# Patient Record
Sex: Female | Born: 1985 | Race: White | Hispanic: No | Marital: Single
Health system: Midwestern US, Community
[De-identification: ages and names within clinical notes are randomized; demographics above are authoritative.]

## PROBLEM LIST (undated history)

## (undated) DIAGNOSIS — K5090 Crohn's disease, unspecified, without complications: Secondary | ICD-10-CM

## (undated) DIAGNOSIS — E282 Polycystic ovarian syndrome: Secondary | ICD-10-CM

## (undated) DIAGNOSIS — O039 Complete or unspecified spontaneous abortion without complication: Secondary | ICD-10-CM

## (undated) DIAGNOSIS — F32A Depression, unspecified: Secondary | ICD-10-CM

## (undated) DIAGNOSIS — R000 Tachycardia, unspecified: Secondary | ICD-10-CM

## (undated) DIAGNOSIS — Z8639 Personal history of other endocrine, nutritional and metabolic disease: Secondary | ICD-10-CM

## (undated) DIAGNOSIS — F419 Anxiety disorder, unspecified: Secondary | ICD-10-CM

## (undated) DIAGNOSIS — F1010 Alcohol abuse, uncomplicated: Secondary | ICD-10-CM

## (undated) DIAGNOSIS — N856 Intrauterine synechiae: Secondary | ICD-10-CM

## (undated) DIAGNOSIS — A498 Other bacterial infections of unspecified site: Secondary | ICD-10-CM

## (undated) DIAGNOSIS — E063 Autoimmune thyroiditis: Secondary | ICD-10-CM

## (undated) HISTORY — PX: INDUCED ABORTION: SHX677

## (undated) HISTORY — PX: BREAST IMPLANT: SHX2716

## (undated) HISTORY — PX: COLONOSCOPY: SHX174

## (undated) HISTORY — PX: TONSILLECTOMY: SUR1361

## (undated) HISTORY — DX: Anxiety disorder, unspecified: F41.9

## (undated) HISTORY — PX: DILATION AND CURETTAGE OF UTERUS: SHX78

## (undated) HISTORY — DX: Depression, unspecified: F32.A

## (undated) HISTORY — PX: BREAST ENHANCEMENT SURGERY: SHX7

---

## 2008-01-05 DIAGNOSIS — E063 Autoimmune thyroiditis: Secondary | ICD-10-CM | POA: Insufficient documentation

## 2010-07-24 ENCOUNTER — Emergency Department: Admit: 2010-07-24 | Payer: Self-pay | Source: Emergency Department | Admitting: Emergency Medicine

## 2012-12-03 DIAGNOSIS — M25559 Pain in unspecified hip: Secondary | ICD-10-CM | POA: Insufficient documentation

## 2013-02-14 ENCOUNTER — Emergency Department
Admission: EM | Admit: 2013-02-14 | Discharge: 2013-02-14 | Disposition: A | Discharge status: Home / Self Care | Payer: BLUE CROSS/BLUE SHIELD | Attending: Emergency Medicine | Admitting: Emergency Medicine

## 2013-02-14 ENCOUNTER — Emergency Department: Payer: BLUE CROSS/BLUE SHIELD

## 2013-02-14 DIAGNOSIS — E860 Dehydration: Secondary | ICD-10-CM

## 2013-02-14 DIAGNOSIS — F411 Generalized anxiety disorder: Secondary | ICD-10-CM | POA: Insufficient documentation

## 2013-02-14 DIAGNOSIS — E063 Autoimmune thyroiditis: Secondary | ICD-10-CM | POA: Insufficient documentation

## 2013-02-14 DIAGNOSIS — K5090 Crohn's disease, unspecified, without complications: Secondary | ICD-10-CM | POA: Insufficient documentation

## 2013-02-14 DIAGNOSIS — R42 Dizziness and giddiness: Secondary | ICD-10-CM | POA: Insufficient documentation

## 2013-02-14 DIAGNOSIS — T50905A Adverse effect of unspecified drugs, medicaments and biological substances, initial encounter: Secondary | ICD-10-CM

## 2013-02-14 DIAGNOSIS — T3795XA Adverse effect of unspecified systemic anti-infective and antiparasitic, initial encounter: Secondary | ICD-10-CM | POA: Insufficient documentation

## 2013-02-14 HISTORY — DX: Anxiety disorder, unspecified: F41.9

## 2013-02-14 HISTORY — DX: Crohn's disease, unspecified, without complications: K50.90

## 2013-02-14 HISTORY — DX: Personal history of other endocrine, nutritional and metabolic disease: Z86.39

## 2013-02-14 LAB — CBC AND DIFFERENTIAL
Basophils Absolute Automated: 0.03 10*3/uL (ref 0.00–0.20)
Basophils Automated: 0 %
Eosinophils Absolute Automated: 0.2 10*3/uL (ref 0.00–0.70)
Eosinophils Automated: 2 %
Hematocrit: 37.4 % (ref 37.0–47.0)
Hgb: 12.5 g/dL (ref 12.0–16.0)
Lymphocytes Absolute Automated: 3.52 10*3/uL (ref 0.50–4.40)
Lymphocytes Automated: 37 %
MCH: 30.6 pg (ref 28.0–32.0)
MCHC: 33.4 g/dL (ref 32.0–36.0)
MCV: 91.7 fL (ref 80.0–100.0)
MPV: 9.7 fL (ref 9.4–12.3)
Monocytes Absolute Automated: 1.07 10*3/uL (ref 0.00–1.20)
Monocytes: 11 %
Neutrophils Absolute: 4.63 10*3/uL (ref 1.80–8.10)
Neutrophils: 49 %
Platelets: 383 10*3/uL (ref 140–400)
RBC: 4.08 10*6/uL — ABNORMAL LOW (ref 4.20–5.40)
RDW: 13 % (ref 12–15)
WBC: 9.45 10*3/uL (ref 3.50–10.80)

## 2013-02-14 LAB — BASIC METABOLIC PANEL
Anion Gap: 14 (ref 5.0–15.0)
BUN: 9 mg/dL (ref 7.0–19.0)
CO2: 22 mEq/L (ref 22–29)
Calcium: 9.7 mg/dL (ref 8.5–10.5)
Chloride: 101 mEq/L (ref 98–107)
Creatinine: 0.9 mg/dL (ref 0.6–1.0)
Glucose: 92 mg/dL (ref 70–100)
Potassium: 3.7 mEq/L (ref 3.5–5.1)
Sodium: 137 mEq/L (ref 136–145)

## 2013-02-14 LAB — HEPATIC FUNCTION PANEL
ALT: 16 U/L (ref 0–55)
AST (SGOT): 22 U/L (ref 5–34)
Albumin/Globulin Ratio: 1.2 (ref 0.9–2.2)
Albumin: 4.3 g/dL (ref 3.5–5.0)
Alkaline Phosphatase: 68 U/L (ref 40–150)
Bilirubin Direct: 0.4 mg/dL (ref 0.0–0.5)
Bilirubin Indirect: 0.3 mg/dL (ref 0.0–1.1)
Bilirubin, Total: 0.7 mg/dL (ref 0.2–1.2)
Globulin: 3.5 g/dL (ref 2.0–3.6)
Protein, Total: 7.8 g/dL (ref 6.0–8.3)

## 2013-02-14 LAB — LIPASE: Lipase: 10 U/L (ref 8–78)

## 2013-02-14 LAB — GFR: EGFR: >60

## 2013-02-14 MED ORDER — ONDANSETRON HCL 4 MG/2ML IJ SOLN
8.00 mg | Freq: Once | INTRAMUSCULAR | Status: AC
Start: 2013-02-14 — End: 2013-02-14
  Administered 2013-02-14: 8 mg via INTRAVENOUS
  Filled 2013-02-14: qty 4

## 2013-02-14 MED ORDER — LORAZEPAM 2 MG/ML IJ SOLN
1.0000 mg | Freq: Once | INTRAMUSCULAR | Status: AC
Start: 2013-02-14 — End: 2013-02-14
  Administered 2013-02-14: 1 mg via INTRAVENOUS
  Filled 2013-02-14: qty 1

## 2013-02-14 MED ORDER — SODIUM CHLORIDE 0.9 % IV BOLUS
2000.0000 mL | Freq: Once | INTRAVENOUS | Status: AC
Start: 2013-02-14 — End: 2013-02-14
  Administered 2013-02-14: 2000 mL via INTRAVENOUS

## 2013-02-14 NOTE — ED Notes (Signed)
Pt discharged with stated understanding of medications and instructions.  Pt AA&OX4.  Respirations even and unlabored.  Skin, warm, pink and dry.  Pt ambulated to the exit with a steady gait and without difficulty.

## 2013-02-14 NOTE — ED Provider Notes (Signed)
Physician/Midlevel provider first contact with patient: 02/14/13 1901       Pt states she has dizziness and nausea that started earlier today. Pt states she also started taking an anti-fungal medication yesterday 02/13/13.Kertoconazol for oral thrush and vaginal yeast infection. Denies fever or chills. No headache. No balance problem     History     Chief Complaint   Patient presents with   . Dizziness   . Nausea     The history is provided by the patient.       Past Medical History   Diagnosis Date   . Crohn's disease    . H/O Hashimoto thyroiditis    . Anxiety        History reviewed. No pertinent past surgical history.    History reviewed. No pertinent family history.    Social  History   Substance Use Topics   . Smoking status: Never Smoker    . Smokeless tobacco: Not on file   . Alcohol Use: Yes      Comment: occasionally       .     Allergies   Allergen Reactions   . Bactrim (Sulfamethoxazole W/Trimethoprim (Co-Trimoxazole)) Rash       Current/Home Medications    BUSPIRONE (BUSPAR) 15 MG TABLET    Take 15 mg by mouth 2 (two) times daily.    CLONAZEPAM (KLONOPIN) 0.5 MG TABLET    Take 0.5 mg by mouth 2 (two) times daily as needed.    FISH OIL-OMEGA-3 FATTY ACIDS 1000 MG CAPSULE    Take 2 g by mouth daily.    INFLIXIMAB (REMICADE IV)    Inject into the vein.    LEVOTHYROXINE (SYNTHROID, LEVOTHROID) 88 MCG TABLET    Take 88 mcg by mouth daily.    ZOLPIDEM (AMBIEN) 5 MG TABLET    Take 5 mg by mouth nightly as needed.        Review of Systems   Constitutional: Positive for fatigue. Negative for fever and chills.   HENT: Negative for ear pain, congestion, sore throat, neck pain, dental problem and voice change.    Eyes: Negative for pain, discharge and visual disturbance.   Respiratory: Negative for cough, chest tightness and shortness of breath.    Cardiovascular: Negative for chest pain and palpitations.   Gastrointestinal: Negative for nausea, vomiting, abdominal pain and diarrhea.   Genitourinary: Negative for  frequency, flank pain and difficulty urinating.   Musculoskeletal: Negative for myalgias, back pain and arthralgias.   Skin: Negative for rash.   Neurological: Positive for dizziness and weakness. Negative for numbness and headaches.   Psychiatric/Behavioral: Negative for suicidal ideas, hallucinations, confusion, self-injury and agitation.       Physical Exam    BP 103/62  Pulse 59  Temp 98.7 F (37.1 C) (Oral)  Resp 20  SpO2 100%  LMP 02/07/2013    Physical Exam   Constitutional: She is oriented to person, place, and time. She appears well-developed and well-nourished. She has a sickly appearance. She appears ill. No distress.   HENT:   Head: Atraumatic.   Right Ear: External ear normal.   Left Ear: External ear normal.   Nose: Nose normal.   Eyes: EOM are normal. Pupils are equal, round, and reactive to light.   Neck: Neck supple. No tracheal deviation present. No thyromegaly present.   Cardiovascular: Normal rate, regular rhythm and normal heart sounds.    No murmur heard.  Pulmonary/Chest: No respiratory distress. She has no wheezes. She  has no rales.   Abdominal: Soft. She exhibits no distension and no mass. There is no tenderness.   Musculoskeletal: Normal range of motion. She exhibits no edema and no tenderness.   Neurological: She is alert and oriented to person, place, and time. No cranial nerve deficit.   Skin: Skin is warm. No rash noted. No erythema.   Psychiatric: She has a normal mood and affect. Judgment normal.       MDM and ED Course     ED Medication Orders      Start     Status Ordering Provider    02/14/13 2015   sodium chloride 0.9 % bolus 2,000 mL   Once      Route: Intravenous  Ordered Dose: 2,000 mL         Last MAR action:  Stopped Tyeler Goedken    02/14/13 2015   ondansetron (ZOFRAN) injection 8 mg   Once      Route: Intravenous  Ordered Dose: 8 mg         Last MAR action:  Given Nakeyia Menden    02/14/13 2015   LORazepam (ATIVAN) injection 1 mg   Once      Route: Intravenous   Ordered Dose: 1 mg         Last MAR action:  Given Tashanda Fuhrer                 MDM      Procedures    Clinical Impression & Disposition     Clinical Impression  Final diagnoses:   Dizziness   Dehydration   Drug reaction, initial encounter        ED Disposition     Discharge [name] discharge to home/self care.    Condition at discharge: Stable             New Prescriptions    No medications on file         Labs Reviewed   CBC AND DIFFERENTIAL - Abnormal; Notable for the following:     RBC 4.08 (*)      All other components within normal limits   BASIC METABOLIC PANEL   LIPASE   HEPATIC FUNCTION PANEL   GFR         Diagnostic Study Results     Labs -     Results     Procedure Component Value Units Date/Time    Basic Metabolic Panel (BMP) [[id number]] Collected:02/14/13 1952    Specimen Information:Blood Updated:02/14/13 2019     Glucose 92 mg/dL      BUN 9.0 mg/dL      Creatinine 0.9 mg/dL      Calcium 9.7 mg/dL      Sodium 409 mEq/L      Potassium 3.7 mEq/L      Chloride 101 mEq/L      CO2 22 mEq/L      Anion Gap 14.0     Lipase [[id number]] Collected:02/14/13 1952    Specimen Information:Blood Updated:02/14/13 2019     Lipase 10 U/L     Hepatic function panel (LFTs) [[id number]] Collected:02/14/13 1952    Specimen Information:Blood Updated:02/14/13 2019     Bilirubin, Total 0.7 mg/dL      Bilirubin, Direct 0.4 mg/dL      Bilirubin, Indirect 0.3 mg/dL      AST (SGOT) 22 U/L      ALT 16 U/L      Alkaline Phosphatase 68 U/L  Protein, Total 7.8 g/dL      Albumin 4.3 g/dL      Globulin 3.5 g/dL      Albumin/Globulin Ratio 1.2     GFR [[id number]] Collected:02/14/13 1952     EGFR >60.0 Updated:02/14/13 2019    CBC with differential [[id number]]  (Abnormal) Collected:02/14/13 1952    Specimen Information:Blood / Blood Updated:02/14/13 2000     WBC 9.45 x10 3/uL      RBC 4.08 (L) x10 6/uL      Hgb 12.5 g/dL      Hematocrit 78.2 %      MCV 91.7 fL      MCH 30.6 pg      MCHC 33.4 g/dL      RDW 13 %      Platelets  383 x10 3/uL      MPV 9.7 fL      Neutrophils 49 %      Lymphocytes Automated 37 %      Monocytes 11 %      Eosinophils Automated 2 %      Basophils Automated 0 %      Immature Granulocyte Unmeasured %      Nucleated RBC Unmeasured /100 WBC      Neutrophils Absolute 4.63 x10 3/uL      Abs Lymph Automated 3.52 x10 3/uL      Abs Mono Automated 1.07 x10 3/uL      Abs Eos Automated 0.20 x10 3/uL      Absolute Baso Automated 0.03 x10 3/uL      Absolute Immature Granulocyte Unmeasured x10 3/uL           Radiologic Studies -   Radiology Results (24 Hour)     ** No Results found for the last 24 hours. **      .    Clinical Course in the Emergency Department            Medical Decision Making     Presumptive Diagnosis: dehydration , drug reaction ,     Treatment Plan: hydration anxiety control  _______________________________  I am the first provider for this patient.  I reviewed the vital signs, nursing notes, past medical history, past surgical history, family history and social history.  Vital Signs - Patient Vitals for the past 12 hrs:   BP Temp Pulse Resp   02/14/13 2214 103/62 mmHg 98.7 F (37.1 C) 59  20    02/14/13 1857 111/64 mmHg 98.4 F (36.9 C) 68  18      Pulse Oximetry Analysis - Normal  Differential Diagnosis (not completely inclusive): anxiety, dehydration   Laboratory results reviewed by EDP: Yes  Radiologic study results reviewed by EDP: No    Radiologic Studies Interpreted (viewed) by EDP: No               Bubba Camp, MD  02/15/13 [phone]

## 2013-02-14 NOTE — ED Notes (Signed)
Pt states she has dizziness and nausea that started earlier today.  Pt states she also started taking an anti-fungal medication yesterday 02/13/13.  Pt AA&Ox4.  Respirations even and unlabored.  Skin, warm pink and dry.  Pt states she is feeling more confused than usual.

## 2013-02-18 LAB — ECG 12-LEAD
Atrial Rate: 61 {beats}/min
P Axis: 44 degrees
P-R Interval: 118 ms
Q-T Interval: 408 ms
QRS Duration: 86 ms
QTC Calculation (Bezet): 410 ms
R Axis: 68 degrees
T Axis: 37 degrees
Ventricular Rate: 61 {beats}/min

## 2013-11-03 ENCOUNTER — Emergency Department: Payer: BLUE CROSS/BLUE SHIELD

## 2013-11-03 ENCOUNTER — Emergency Department
Admission: EM | Admit: 2013-11-03 | Discharge: 2013-11-03 | Disposition: A | Discharge status: Home / Self Care | Payer: BLUE CROSS/BLUE SHIELD | Attending: Emergency Medicine | Admitting: Emergency Medicine

## 2013-11-03 DIAGNOSIS — R000 Tachycardia, unspecified: Secondary | ICD-10-CM | POA: Insufficient documentation

## 2013-11-03 DIAGNOSIS — R079 Chest pain, unspecified: Secondary | ICD-10-CM | POA: Insufficient documentation

## 2013-11-03 DIAGNOSIS — F12920 Cannabis use, unspecified with intoxication, uncomplicated: Secondary | ICD-10-CM

## 2013-11-03 DIAGNOSIS — F19988 Other psychoactive substance use, unspecified with other psychoactive substance-induced disorder: Secondary | ICD-10-CM | POA: Insufficient documentation

## 2013-11-03 DIAGNOSIS — K5090 Crohn's disease, unspecified, without complications: Secondary | ICD-10-CM | POA: Insufficient documentation

## 2013-11-03 DIAGNOSIS — F411 Generalized anxiety disorder: Secondary | ICD-10-CM | POA: Insufficient documentation

## 2013-11-03 DIAGNOSIS — E063 Autoimmune thyroiditis: Secondary | ICD-10-CM | POA: Insufficient documentation

## 2013-11-03 LAB — RAPID DRUG SCREEN, URINE
Barbiturate Screen, UR: NEGATIVE
Benzodiazepine Screen, UR: POSITIVE — AB
Cannabinoid Screen, UR: POSITIVE — AB
Cocaine, UR: NEGATIVE
Opiate Screen, UR: NEGATIVE
PCP Screen, UR: NEGATIVE
Urine Amphetamine Screen: NEGATIVE

## 2013-11-03 LAB — COMPREHENSIVE METABOLIC PANEL
ALT: 17 U/L (ref 0–55)
AST (SGOT): 23 U/L (ref 5–34)
Albumin/Globulin Ratio: 1.2 (ref 0.9–2.2)
Albumin: 4 g/dL (ref 3.5–5.0)
Alkaline Phosphatase: 52 U/L (ref 40–150)
Anion Gap: 8 (ref 5.0–15.0)
BUN: 7 mg/dL (ref 7.0–19.0)
Bilirubin, Total: 0.5 mg/dL (ref 0.2–1.2)
CO2: 23 mEq/L (ref 22–29)
Calcium: 9.2 mg/dL (ref 8.5–10.5)
Chloride: 106 mEq/L (ref 98–107)
Creatinine: 0.8 mg/dL (ref 0.6–1.0)
Globulin: 3.4 g/dL (ref 2.0–3.6)
Glucose: 130 mg/dL — ABNORMAL HIGH (ref 70–100)
Potassium: 3 mEq/L — ABNORMAL LOW (ref 3.5–5.1)
Protein, Total: 7.4 g/dL (ref 6.0–8.3)
Sodium: 137 mEq/L (ref 136–145)

## 2013-11-03 LAB — CBC AND DIFFERENTIAL
Basophils Absolute Automated: 0.02 (ref 0.00–0.20)
Basophils Automated: 0 %
Eosinophils Absolute Automated: 0.12 (ref 0.00–0.70)
Eosinophils Automated: 1 %
Hematocrit: 38.4 % (ref 37.0–47.0)
Hgb: 12.4 g/dL (ref 12.0–16.0)
Lymphocytes Absolute Automated: 3.81 (ref 0.50–4.40)
Lymphocytes Automated: 42 %
MCH: 31 pg (ref 28.0–32.0)
MCHC: 32.3 g/dL (ref 32.0–36.0)
MCV: 96 fL (ref 80.0–100.0)
MPV: 9.3 fL — ABNORMAL LOW (ref 9.4–12.3)
Monocytes Absolute Automated: 1.06 (ref 0.00–1.20)
Monocytes: 12 %
Neutrophils Absolute: 4.13 (ref 1.80–8.10)
Neutrophils: 45 %
Platelets: 401 — ABNORMAL HIGH (ref 140–400)
RBC: 4 — ABNORMAL LOW (ref 4.20–5.40)
RDW: 12 % (ref 12–15)
WBC: 9.14 (ref 3.50–10.80)

## 2013-11-03 LAB — CK: Creatine Kinase (CK): 152 U/L (ref 29–168)

## 2013-11-03 LAB — TROPONIN I: Troponin I: <0.01 ng/mL (ref 0.00–0.09)

## 2013-11-03 LAB — URINE HCG QUALITATIVE: Urine HCG Qualitative: NEGATIVE

## 2013-11-03 LAB — GFR: EGFR: >60

## 2013-11-03 MED ORDER — SODIUM CHLORIDE 0.9 % IV BOLUS
1000.00 mL | Freq: Once | INTRAVENOUS | Status: AC
Start: 2013-11-03 — End: 2013-11-03
  Administered 2013-11-03: 1000 mL via INTRAVENOUS

## 2013-11-03 MED ORDER — LORAZEPAM 2 MG/ML IJ SOLN
1.0000 mg | Freq: Once | INTRAMUSCULAR | Status: DC
Start: 2013-11-03 — End: 2013-11-04

## 2013-11-03 MED ORDER — LORAZEPAM 2 MG/ML IJ SOLN
1.0000 mg | Freq: Once | INTRAMUSCULAR | Status: AC
Start: 2013-11-03 — End: 2013-11-03
  Administered 2013-11-03: 1 mg via INTRAVENOUS
  Filled 2013-11-03: qty 1

## 2013-11-03 NOTE — ED Notes (Signed)
Pt educated about discharge instructions including medications, follow up and need to return if symptoms get worse or do not improve.  Pt verbalized understanding.

## 2013-11-03 NOTE — ED Notes (Signed)
Awaiting urine results for discharge.

## 2013-11-03 NOTE — ED Provider Notes (Addendum)
Physician/Midlevel provider first contact with patient: 11/03/13 2138         History     Chief Complaint   Patient presents with   . Chest Pain     HPI Comments: Patient tried marijuana with one hitter an hour ago and developed rapid heart rate and upper chest aching pain - pain non radiating - moderate - no dizziness, no shortness of breath, no fever, no cough, no URI. No abdominal pain. NO fever.    Patient is a 27 y.o. female presenting with chest pain. The history is provided by the patient.   Chest Pain  The chest pain began less than 1 hour ago. Duration of episode(s) is 1 hour. Chest pain occurs constantly. The chest pain is unchanged. The pain is associated with breathing. At its most intense, the pain is at 3/10. The pain is currently at 3/10. The quality of the pain is described as burning. The pain does not radiate. Primary symptoms include palpitations. Pertinent negatives for primary symptoms include no fever, no fatigue, no shortness of breath, no cough, no wheezing, no abdominal pain, no nausea, no vomiting, no dizziness and no altered mental status.   The palpitations did not occur with dizziness or shortness of breath.           Past Medical History   Diagnosis Date   . Crohn's disease    . H/O Hashimoto thyroiditis    . Anxiety        History reviewed. No pertinent past surgical history.    History reviewed. No pertinent family history.    Social  History   Substance Use Topics   . Smoking status: Never Smoker    . Smokeless tobacco: Not on file   . Alcohol Use: Yes      Comment: occasionally       .     Allergies   Allergen Reactions   . Bactrim (Sulfamethoxazole W/Trimethoprim (Co-Trimoxazole)) Rash       Current/Home Medications    CLONAZEPAM (KLONOPIN) 0.5 MG TABLET    Take 0.5 mg by mouth 2 (two) times daily as needed.    FISH OIL-OMEGA-3 FATTY ACIDS 1000 MG CAPSULE    Take 2 g by mouth daily.    INFLIXIMAB (REMICADE IV)    Inject into the vein.    LEVOTHYROXINE (SYNTHROID, LEVOTHROID) 88  MCG TABLET    Take 88 mcg by mouth daily.        Review of Systems   Constitutional: Negative for fever and fatigue.   HENT: Negative for rhinorrhea and sore throat.    Respiratory: Negative for cough, shortness of breath and wheezing.    Cardiovascular: Positive for chest pain and palpitations.   Gastrointestinal: Negative for nausea, vomiting, abdominal pain, diarrhea and blood in stool.   Genitourinary: Negative for dysuria, hematuria and difficulty urinating.   Skin: Negative for rash.   Neurological: Negative for dizziness.   All other systems reviewed and are negative.        Physical Exam    BP 119/64  Pulse 147  Temp 97.6 F (36.4 C)  Resp 20  Ht 1.549 m  Wt 45.36 kg  BMI 18.90 kg/m2  SpO2 100%  LMP 11/02/2013    Physical Exam   Nursing note and vitals reviewed.  Constitutional: She is oriented to person, place, and time. She appears well-developed and well-nourished. No distress.   HENT:   Head: Normocephalic and atraumatic.   Right Ear: External ear normal.  Left Ear: External ear normal.   Nose: Nose normal.   Mouth/Throat: Oropharynx is clear and moist.   Eyes: Conjunctivae normal and EOM are normal. Pupils are equal, round, and reactive to light.   Neck: Normal range of motion. Neck supple.   Cardiovascular: Regular rhythm, normal heart sounds and intact distal pulses.  Exam reveals no gallop and no friction rub.    No murmur heard.       Tachycardia.   Pulmonary/Chest: Effort normal and breath sounds normal. No respiratory distress. She has no wheezes. She has no rales. She exhibits no tenderness.   Abdominal: Soft. Bowel sounds are normal. She exhibits no distension. There is no tenderness. There is no rebound and no guarding.   Musculoskeletal: Normal range of motion.   Neurological: She is alert and oriented to person, place, and time. She has normal reflexes. No cranial nerve deficit.   Skin: Skin is warm and dry. She is not diaphoretic.   Psychiatric: She has a normal mood and affect.        MDM and ED Course     ED Medication Orders      Start     Status Ordering Provider    11/03/13 2155   LORazepam (ATIVAN) injection 1 mg   Once      Route: Intravenous  Ordered Dose: 1 mg         Last MAR action:  Given Ainhoa Rallo ALAN    11/03/13 2144   LORazepam (ATIVAN) injection 1 mg   Once      Route: Intravenous  Ordered Dose: 1 mg         Acknowledged Acelin Ferdig ALAN    11/03/13 2144   sodium chloride 0.9 % bolus 1,000 mL   Once      Route: Intravenous  Ordered Dose: 1,000 mL         Last MAR action:  New Bag Plez Belton ALAN                 MDM  Number of Diagnoses or Management Options     Amount and/or Complexity of Data Reviewed  Clinical lab tests: ordered  Tests in the radiology section of CPT: ordered    Risk of Complications, Morbidity, and/or Mortality  Presenting problems: moderate  Diagnostic procedures: moderate  Management options: moderate    Patient Progress  Patient progress: stable        Procedures    Clinical Impression & Disposition     Clinical Impression  Final diagnoses:   Marijuana intoxication, uncomplicated   Tachycardia   Chest pain        ED Disposition     Discharge Sherre Lain discharge to home/self care.    Condition at disposition: Stable             New Prescriptions    No medications on file             EKG Interpretation:    Rhythm:  Atrial Tachycardia  Ectopy:  None  Rate:  Tachycardic  Conduction:  No blocks  ST Segments:  No acute ST segment changes  T Waves:  No acute T Wave changes  Axis:  Normal  Other findings:    Q Waves:  None seen  Pacing:  Not applicable  Clinical Impression:  Dysrhythmia-Atrial      Joseph Berkshire, MD  11/03/13 2206    Labs Reviewed   COMPREHENSIVE METABOLIC PANEL - Abnormal; Notable for  the following:     Glucose 130 (*)     Potassium 3.0 (*)     All other components within normal limits   CBC AND DIFFERENTIAL - Abnormal; Notable for the following:     RBC 4.00 (*)     Platelets 401 (*)     MPV 9.3 (*)      All other components within normal limits   CK   TROPONIN I   GFR   RAPID DRUG SCREEN, URINE   ETHANOL   URINE HCG QUALITATIVE       HR normal no symptoms labs nl  ekg normal @ 1105 PM      Joseph Berkshire, MD  11/03/13 2308

## 2013-11-03 NOTE — ED Notes (Signed)
Pt states she was "smoking weed" 15 minutes pta and 10 minutes pta experienced cp and rapid heart rate.  Rate 145 in triage.  Denies knowing if it was laced with anything.

## 2013-11-07 LAB — ECG 12-LEAD
Atrial Rate: 147 {beats}/min
Atrial Rate: 90 {beats}/min
P Axis: 70 degrees
P Axis: 75 degrees
P-R Interval: 114 ms
P-R Interval: 122 ms
Q-T Interval: 274 ms
Q-T Interval: 344 ms
QRS Duration: 70 ms
QRS Duration: 88 ms
QTC Calculation (Bezet): 420 ms
QTC Calculation (Bezet): 428 ms
R Axis: 71 degrees
R Axis: 81 degrees
T Axis: -6 degrees
T Axis: 54 degrees
Ventricular Rate: 147 {beats}/min
Ventricular Rate: 90 {beats}/min

## 2013-11-25 ENCOUNTER — Encounter (INDEPENDENT_AMBULATORY_CARE_PROVIDER_SITE_OTHER): Payer: Self-pay

## 2013-11-25 ENCOUNTER — Ambulatory Visit (INDEPENDENT_AMBULATORY_CARE_PROVIDER_SITE_OTHER): Payer: BLUE CROSS/BLUE SHIELD | Admitting: Family

## 2013-11-25 VITALS — BP 106/64 | HR 112 | Temp 98.2°F | Resp 16 | Ht 62.0 in | Wt 100.0 lb

## 2013-11-25 DIAGNOSIS — J029 Acute pharyngitis, unspecified: Secondary | ICD-10-CM

## 2013-11-25 LAB — POCT RAPID STREP A: Rapid Strep A Screen POCT: NEGATIVE

## 2013-11-25 MED ORDER — AMOXICILLIN 500 MG PO TABS
500.0000 mg | ORAL_TABLET | Freq: Two times a day (BID) | ORAL | Status: AC
Start: 2013-11-25 — End: 2013-12-05

## 2013-11-25 NOTE — Progress Notes (Signed)
Subjective:       Patient ID: [name] is a 27 y.o. female.    Chief Complaint   Patient presents with   . Sore Throat     Sore throat and white coat  on tongue , giving pt bad breath  . Sore throat since yesterday       HPI Comments: Sore throat with white drainage and foul smelling breath since yesterday    Sore Throat   This is a new problem. The current episode started yesterday. The problem has been gradually worsening. There has been no fever. The pain is at a severity of 5/10. The pain is moderate. Pertinent negatives include no abdominal pain, congestion, coughing, diarrhea, drooling, ear discharge, ear pain, headaches, hoarse voice, plugged ear sensation, neck pain, shortness of breath, stridor, swollen glands or trouble swallowing. 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 She has tried cool liquids for the symptoms.       The following portions of the patient's history were reviewed and updated as appropriate: allergies, current medications, past family history, past medical history, past social history, past surgical history and problem list.    Review of Systems   Constitutional: Negative for fever, activity change and fatigue.   HENT: Positive for sore throat. Negative for congestion, drooling, ear discharge, ear pain, hoarse voice and trouble swallowing.    Respiratory: Negative for cough, shortness of breath and stridor.    Gastrointestinal: Negative for abdominal pain and diarrhea.   Musculoskeletal: Negative for neck pain.   Neurological: Negative for headaches.           Objective:     Physical Exam   Nursing note and vitals reviewed.  Constitutional: She is oriented to person, place, and time. She appears well-developed and well-nourished.  Non-toxic appearance. She does not have a sickly appearance. She does not appear ill. No distress.   HENT:   Head: Normocephalic and atraumatic.   Right Ear: Hearing, tympanic membrane, external ear and ear canal normal.   Left Ear:  Hearing, tympanic membrane, external ear and ear canal normal.   Nose: Nose normal.   Mouth/Throat: Uvula is midline and mucous membranes are normal. Oropharyngeal exudate, posterior oropharyngeal edema and posterior oropharyngeal erythema present. No tonsillar abscesses.   Cardiovascular: Normal rate, regular rhythm, normal heart sounds and intact distal pulses.  Exam reveals no gallop and no friction rub.    No murmur heard.  Pulmonary/Chest: Effort normal and breath sounds normal. No respiratory distress. She has no wheezes. She has no rales. She exhibits no tenderness.   Neurological: She is alert and oriented to person, place, and time.   Skin: Skin is warm and dry.           Assessment:       1. Exudative pharyngitis  Upper respiratory culture    amoxicillin (AMOXIL) 500 MG tablet     Results     Procedure Component Value Units Date/Time    Rapid Group A Strep [[id number]] Collected:11/25/13 1455    Specimen Information:Throat Updated:11/25/13 1455     POCT QC Pass      Rapid Strep A Screen POCT Negative       Comment        Result:     Negative Results should be confirmed by throat Cx to confirm absence of Strep A inf.              Plan:  Increase fluid intake   Get plenty of sleep and rest   Start the antibiotic, we will call you with throat culture results in a few days, if its negative you can stop the abx then   Return if worse, not improving or as needed   Good hand washing   Ibuprofen as needed for pain

## 2013-11-25 NOTE — Patient Instructions (Signed)
   Increase fluid intake   Get plenty of sleep and rest   Start the antibiotic, we will call you with throat culture results in a few days, if its negative you can stop the abx then   Return if worse, not improving or as needed   Good hand washing   Ibuprofen as needed for pain      Pharyngitis (Sore Throat),Report Pending    Pharyngitis (sore throat) is often due to a virus, but can also be caused by the"strep"bacteria. This is called"strep throat". Both viral and strep infection can cause throat pain that is worse when swallowing, aching all over with headache and fever. Both types of infections are contagious. They may be spread by coughing, kissing or touching others after touching your mouth or nose.  A test has been done to determine whether or not you have strep throat. Call this facility as directed for the result. If it is positive for strep infection you will need to take antibiotics. A prescription can be called in to your pharmacy at that time. If the test is negative, you probably have a viral pharyngitis and it will not require antibiotic treatment.  Home Care:   If your symptoms are severe, rest at home for the first 2-3 days. If you are told that your test is positive for strep, you should be off work and school for the first two days of antibiotic treatment. After that, you will no longer be contagious.   Children: Use acetaminophen (Tylenol) for fever, fussiness or discomfort. In infants over six months of age, you may use ibuprofen (Children's Motrin) instead of Tylenol. [NOTE: If your child has chronic liver or kidney disease or ever had a stomach ulcer or GI bleeding, talk with your doctor before using these medicines.] (Aspirin should never be used in anyone under 34 years of age who is ill with a fever. It may cause severe liver damage.)Adults: You may use acetaminophen (Tylenol) or ibuprofen (Motrin, Advil) to control pain or fever, unless another medicine was prescribed for this.  [NOTE: If you have chronic liver or kidney disease or ever had a stomach ulcer or GI bleeding, talk with your doctor before using these medicines.]   Throat lozenges or sprays (Chloraseptic and others), or gargling with warm salt water will reduce throat pain. Dissolve 1/2 teaspoon of salt in 1 glass of warm water. This is especially useful just before meals.  Follow Up  with your doctor as advised by our staff if you are not improving over the next week.  Get Prompt Medical Attention  if any of the following occur:   Fever of 100.78F (38C) oral or higher, not better with fever medication   New or worsening ear pain, sinus pain or headache   Painful lumps in the back of your neck   Unable to swallow liquids or open your mouth wide due to throat pain   Trouble breathing or noisy breathing   Muffled voice   New rash   [address], [address], [place], [place] [address]. All rights reserved. This information is not intended as a substitute for professional medical care. Always follow your healthcare professional's instructions.

## 2013-11-28 ENCOUNTER — Telehealth (INDEPENDENT_AMBULATORY_CARE_PROVIDER_SITE_OTHER): Payer: Self-pay | Admitting: Family

## 2013-11-28 LAB — UPPER RESPIRATORY CULTURE

## 2013-11-28 NOTE — Telephone Encounter (Signed)
Notify Patient: Throat culture negative. If on antibiotics for strep throat ok for patient to stop antibiotics.

## 2013-12-17 ENCOUNTER — Encounter (INDEPENDENT_AMBULATORY_CARE_PROVIDER_SITE_OTHER): Payer: Self-pay

## 2013-12-17 ENCOUNTER — Ambulatory Visit (INDEPENDENT_AMBULATORY_CARE_PROVIDER_SITE_OTHER): Payer: BLUE CROSS/BLUE SHIELD | Admitting: Internal Medicine

## 2013-12-17 VITALS — BP 104/70 | HR 120 | Temp 98.0°F | Resp 18 | Ht 61.0 in | Wt 92.0 lb

## 2013-12-17 DIAGNOSIS — J039 Acute tonsillitis, unspecified: Secondary | ICD-10-CM

## 2013-12-17 DIAGNOSIS — R059 Cough, unspecified: Secondary | ICD-10-CM

## 2013-12-17 LAB — POCT RAPID STREP A: Rapid Strep A Screen POCT: NEGATIVE

## 2013-12-17 MED ORDER — AMOXICILLIN-POT CLAVULANATE 875-125 MG PO TABS
1.0000 | ORAL_TABLET | Freq: Two times a day (BID) | ORAL | Status: AC
Start: 2013-12-17 — End: 2013-12-27

## 2013-12-17 NOTE — Progress Notes (Signed)
Subjective:       Patient ID: [name] is a 27 y.o. female.    HPI      Chief Complaint   Patient presents with   . Sore Throat     sore throat since thursday, a little bit cough.....     Sore throat for a few days, getting worse.  Really hurts to swallow and this makes her cough.  No runny nose or earache.  Low grade fever.  No vomiting or diarrhea.  No body aches.  No dyspnea or chest pain.  Able to swallow liquids and solids, no drooling.  Voice is a little hoarse.  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    Past Medical History   Diagnosis Date   . Crohn's disease    . H/O Hashimoto thyroiditis    . Anxiety      Current Outpatient Prescriptions   Medication Sig Dispense Refill   . clonazePAM (KLONOPIN) 0.5 MG tablet Take 0.5 mg by mouth 2 (two) times daily as needed.       . fish oil-omega-3 fatty acids 1000 MG capsule Take 2 g by mouth daily.       . InFLIXimab (REMICADE IV) Inject into the vein.       Marland Kitchen levothyroxine (SYNTHROID, LEVOTHROID) 88 MCG tablet Take 88 mcg by mouth daily.       . Zolpidem Tartrate (AMBIEN PO) Take by mouth.         Allergies   Allergen Reactions   . Bactrim (Sulfamethoxazole W/Trimethoprim (Co-Trimoxazole)) Rash     History     Social History   . Marital Status: Single     Spouse Name: N/A     Number of Children: N/A   . Years of Education: N/A     Occupational History   . Not on file.     Social History Main Topics   . Smoking status: Never Smoker    . Smokeless tobacco: Not on file   . Alcohol Use: Yes      Comment: occasionally   . Drug Use: Yes     Special: Marijuana      Comment: pta   . Sexually Active: Not on file     Other Topics Concern   . Not on file     Social History Narrative   . No narrative on file         Review of Systems        Objective:     Physical Exam   Constitutional: She is oriented to person, place, and time. No distress.   HENT:   Nose: Nose normal.   Mouth/Throat: Uvula is midline and mucous membranes are normal. Oropharyngeal exudate and posterior  oropharyngeal erythema present.        Both tonsils swollen with yellow/white exudate   Eyes: Conjunctivae normal are normal. No scleral icterus.   Neck: Neck supple. No tracheal deviation present.   Pulmonary/Chest: Effort normal.   Lymphadenopathy:        Head (right side): No submental, no submandibular, no preauricular, no posterior auricular and no occipital adenopathy present.        Head (left side): No submental, no submandibular, no preauricular, no posterior auricular and no occipital adenopathy present.     She has cervical adenopathy (anterior cervical).        Right cervical: No posterior cervical adenopathy present.       Left cervical: No posterior cervical adenopathy present.  Right: No supraclavicular adenopathy present.        Left: No supraclavicular adenopathy present.   Neurological: She is alert and oriented to person, place, and time. No cranial nerve deficit.   Skin: Skin is warm and dry. She is not diaphoretic.   Psychiatric: She has a normal mood and affect. Her speech is normal and behavior is normal.     BP 104/70  Pulse 120  Temp 98 F (36.7 C) (Oral)  Resp 18  Ht 1.549 m (5\' 1" )  Wt 41.731 kg (92 lb)  BMI 17.39 kg/m2  LMP 12/16/2013  RST neg      Assessment:       1. Tonsillitis  amoxicillin-clavulanate (AUGMENTIN) 875-125 MG per tablet   2. Cough  Rapid Group A Strep           Plan:       augmentin bid x 10 days.  New toothbrush.  Saline gargles.  Return prn.

## 2013-12-17 NOTE — Patient Instructions (Signed)
Pharyngitis: Strep [Presumed]    Your illness has the signs of a strep throat infection. Strep throat is a contagious illness. It is spread by coughing, kissing or by touching others after touching your mouth or nose. Symptoms include throat pain worse with swallowing, aching all over, headache and fever. You will be treated with an antibiotic, which should make you start to feel better within 1-2 days.  Home Care:  1. Rest at home and drink plenty of fluids to avoid dehydration.  2. No school or work for the first two days on antibiotics. You will not be contagious after this time, and if you are feeling better, you can return to school or work.  3. Take your antibiotics for a full 10 days, even if you feel better after the first few days of treatment. This is very important to prevent complications from the strep infection (such as heart or kidney disease).  4. Children: Use acetaminophen (Tylenol) for fever, fussiness or discomfort. In infants over six months of age, you may use ibuprofen (Children's Motrin) instead of Tylenol. [NOTE: If your child has chronic liver or kidney disease or ever had a stomach ulcer or GI bleeding, talk with your doctor before using these medicines.] (Aspirin should never be used in anyone under 18 years of age who is ill with a fever. It may cause severe liver damage.)  Adults: You may use acetaminophen (Tylenol) or ibuprofen (Motrin, Advil) to control pain or fever, unless another medicine was prescribed for this. [NOTE: If you have chronic liver or kidney disease or ever had a stomach ulcer or GI bleeding, talk with your doctor before using these medicines.]  5. Throat lozenges or sprays (Chloraseptic and others) will reduce pain. Gargling with warm salt water will also reduce throat pain. Dissolve 1/2 teaspoon of salt in 1 glass of warm water. This is especially useful just before meals.  Follow Up  with your doctor or as directed by our staff if you are not improving over the  next week.  Get Prompt Medical Attention  if any of the following occur:   Fever over 100.5F (38.0C) oral, or over 101.5F (38.6C) rectal for more than three days   New or worsening ear pain, sinus pain or headache   Painful lumps in the back of your neck   Unable to swallow liquids or open your mouth wide due to throat pain   Trouble breathing or noisy breathing   Muffled voice   New rash   2000-2014 [employer], [address], [place] [address]. All rights reserved. This information is not intended as a substitute for professional medical care. Always follow your healthcare professional's instructions.

## 2013-12-18 ENCOUNTER — Encounter (INDEPENDENT_AMBULATORY_CARE_PROVIDER_SITE_OTHER): Payer: Self-pay | Admitting: Internal Medicine

## 2014-03-23 ENCOUNTER — Encounter (INDEPENDENT_AMBULATORY_CARE_PROVIDER_SITE_OTHER): Payer: Self-pay

## 2014-03-23 ENCOUNTER — Ambulatory Visit (INDEPENDENT_AMBULATORY_CARE_PROVIDER_SITE_OTHER): Payer: BLUE CROSS/BLUE SHIELD | Admitting: Internal Medicine

## 2014-03-23 VITALS — BP 129/87 | HR 103 | Temp 98.7°F | Resp 18 | Ht 61.0 in | Wt 93.0 lb

## 2014-03-23 DIAGNOSIS — Z733 Stress, not elsewhere classified: Secondary | ICD-10-CM

## 2014-03-23 DIAGNOSIS — R002 Palpitations: Secondary | ICD-10-CM

## 2014-03-23 DIAGNOSIS — F411 Generalized anxiety disorder: Secondary | ICD-10-CM

## 2014-03-23 DIAGNOSIS — Z658 Other specified problems related to psychosocial circumstances: Secondary | ICD-10-CM

## 2014-03-23 DIAGNOSIS — F410 Panic disorder [episodic paroxysmal anxiety] without agoraphobia: Secondary | ICD-10-CM

## 2014-03-23 DIAGNOSIS — R61 Generalized hyperhidrosis: Secondary | ICD-10-CM

## 2014-03-23 DIAGNOSIS — F419 Anxiety disorder, unspecified: Secondary | ICD-10-CM

## 2014-03-23 LAB — POCT GLUCOSE: Whole Blood Glucose POCT: 70 mg/dL (ref 70–100)

## 2014-03-23 MED ORDER — CLONAZEPAM 0.5 MG PO TABS
0.5000 mg | ORAL_TABLET | Freq: Two times a day (BID) | ORAL | Status: DC | PRN
Start: 2014-03-23 — End: 2014-09-17

## 2014-03-23 NOTE — Progress Notes (Signed)
Subjective:       Patient ID: [name] is a 28 y.o. female.  Chief Complaint   Patient presents with   . Palpitations     Onset today pt c.o palpitations (feels like heart will skip beats which takes breathe away) and shortness of breath. Pt states this has happened before.        HPI Comments: Patient reports her friend's dog chewed through her bottle of klonopin and ate the pills so she's been off for one week.  Took a xanax last night (per patient prescribed by her PCP) took 0.5mg  but that was the last one she had left.  Reports she felt like she was having a panic attack last night.  Reports she has panic attacks 4 times per week even while on klonopin.  Patient is a Theatre stage manager.      Palpitations   This is a new problem. The current episode started today. Associated symptoms include anxiety, chest pain (midsternal onset today.  constant and radiates to left shoulder), diaphoresis, dizziness (today) and an irregular heartbeat (4-5 days). Pertinent negatives include no coughing, nausea, near-syncope, syncope or vomiting. Associated symptoms comments: + abdominal pain.. She has tried breathing exercises for the symptoms. The treatment provided no relief. There are no known risk factors. Her past medical history is significant for anxiety and drug use (denies but has + UDS last yr in november). There is no history of heart disease, hyperthyroidism or a valve disorder. denies illicit drug use.  hx of hypothyroidism       The following portions of the patient's history were reviewed and updated as appropriate: allergies, current medications, past family history, past medical history, past social history, past surgical history and problem list.    Review of Systems   Constitutional: Positive for diaphoresis.   Respiratory: Negative for cough.    Cardiovascular: Positive for chest pain (midsternal onset today.  constant and radiates to left shoulder) and palpitations. Negative for syncope and near-syncope.    Gastrointestinal: Negative for nausea and vomiting.   Neurological: Positive for dizziness (today).   All other review of systems negative outside of what is stated in HPI.            Objective:     Physical Exam   Nursing note and vitals reviewed.  Constitutional: She is oriented to person, place, and time. She appears well-developed and well-nourished.   HENT:   Head: Normocephalic and atraumatic.   Mouth/Throat: Oropharynx is clear and moist.   Eyes: Conjunctivae normal and EOM are normal. Pupils are equal, round, and reactive to light.   Neck: Normal range of motion. Neck supple.   Cardiovascular: Regular rhythm and normal heart sounds.         Tachycardic   Pulmonary/Chest: Effort normal and breath sounds normal.   Abdominal: Soft. Bowel sounds are normal. She exhibits no distension. There is no tenderness.   Musculoskeletal: Normal range of motion.   Lymphadenopathy:     She has no cervical adenopathy.   Neurological: She is alert and oriented to person, place, and time.   Skin: Skin is warm and dry. No rash noted.   Psychiatric: Her speech is normal and behavior is normal. Thought content normal. Her mood appears anxious. Her affect is not labile and not inappropriate. She exhibits a depressed mood.           Assessment:       1. Anxiety  Ambulatory referral to Psychology  clonazePAM (KLONOPIN) 0.5 MG tablet   2. Heart palpitations  Rapid drug screen, urine    clonazePAM (KLONOPIN) 0.5 MG tablet    Glucose   3. Diaphoresis     4. Psychosocial stressors  Ambulatory referral to Psychology   5. Panic attacks  clonazePAM (KLONOPIN) 0.5 MG tablet             Plan:       Rapid drug screen negative but patient claims she took last zanax pill last night.  Should have a +UDS.   Filled script for klonopin and xanax on 03/06/14 claims her friend's dog ate her klonopin and she only had 1 xanax left and took that  Too long for benzo w/d since symptoms should have dissipated by now if due to withdrawal and less likely  to occur if she was taking xanax.    Patient was able to show receipt for the [employer] poison control (proving dog was poisoned by her pills)   PMP does not show frequent new scripts and most with same provider.      Recent stressors include divorce, school and chronic stressors with IBD etc.  Also uses xanax before Remicade infusion due to fear of needles (patient was very anxious when getting blood drawn here)    Referral to psychologist for therapy to help with stress reduction and stress management  Follow up with PCP for anxiety meds (Patient given script for klonopin to last until she sees her PCP).  EKG normal sinus rhythm unlikely cardiac CP.  Likely due to anxiety  If symptoms change or worsen go to ER immediately

## 2014-03-23 NOTE — Patient Instructions (Signed)
Referral to psychologist for therapy to help with stress reduction and stress management  Follow up with PCP for anxiety meds (too soon to refill)  EKG normal sinus rhythm unlikely cardiac CP.  Likely due to anxiety  If symptoms change or worsen go to ER immediately                            Your Body's Response to Anxiety  Anxiety is part of the body's natural defense system. It takes over when you're threatened and doesn't let up until you're safe again. While you're in this state, you feel strong emotions such as fear, and physical sensations such as a pounding heartbeat. These feelings make you want to react to the threat. An anxiety response is normal in many situations. But when you have an anxiety disorder, the same response can occur at the wrong times.  Anxiety Can Be Helpful  Anxiety is like an alarm bell in your brain. When you're threatened, the alarm goes off and tells your body to protect you. This is part of the same "fight or flight" response that helped our early ancestors survive. It made them react quickly to physical threats such as wild animals. Today, you may experience adaptive (healthy) anxiety:   When you're in danger:Anxiety prompts you to run out of a burning building, or to swerve while driving to avoid hitting another car. In theses cases, the anxiety response makes you react quickly to protect yourself.   When you need to succeed:You may feel anxious when you open an overdue bill, study for a test, or prepare to give a speech. In these situations, the anxiety response helps you focus on the task at hand so you do a better job.  Anxiety Can Also Be a Problem  With an anxiety disorder, your body has the response described above, but in inappropriate ways. The response a person has depends on the anxiety disorder he or she has. With some disorders, the anxiety is way out of proportion to the threat that triggers it. With others, anxiety may occur even when there isn't a clear threat  or trigger.  Who Does It Affect?  Some people are more prone to persistent anxiety than others. It tends to run in families, and it affects more younger people than older people. But no age, race, or gender is immune to anxiety problems.  How Does It Feel?  At certain times, people with anxiety may have:   Fear   Muscle tension or pain   Restlessness   Sleeplessness   Difficulty concentrating   Racing heartbeat   Fast breathing   Shaking or trembling   Stomachache   Diarrhea   Loss of energy   Sweating   Cold, clammy hands   Chest pain   Dry mouth  Anxiety Can Be Treated  The good news is that the anxiety that's disrupting your life can be treated. Working with your doctor or other healthcare provider, you can develop skills to help you cope with anxiety. You can also gain the perspective you need to overcome your fears. Note:Good sources of support or guidance can be found at your local hospital, mental health clinic, or an employee assistance program.  If anxiety is wearing you down, here are some things you can do to cope:   Keep in mind that you can't control everything about a situation. Change what you can and let the rest take its course.  Exercise--it's a great way to relieve tension and help your body feel relaxed.   Avoid caffeine and nicotine, which can make anxiety symptoms worse.   Fight the temptation to turn to alcohol or unprescribed drugs for relief. They only make things worse in the long run.    [address] [place], [place] [address]. All rights reserved. This information is not intended as a substitute for professional medical care. Always follow your healthcare professional's instructions.

## 2014-03-23 NOTE — Progress Notes (Signed)
Pt agreed to rapid drug screening. Z.A. clin tech

## 2014-04-25 ENCOUNTER — Encounter (INDEPENDENT_AMBULATORY_CARE_PROVIDER_SITE_OTHER): Payer: Self-pay

## 2014-04-25 ENCOUNTER — Ambulatory Visit (INDEPENDENT_AMBULATORY_CARE_PROVIDER_SITE_OTHER): Payer: BLUE CROSS/BLUE SHIELD | Admitting: Internal Medicine

## 2014-04-25 VITALS — BP 117/72 | HR 109 | Temp 98.6°F | Resp 14 | Ht 61.0 in | Wt 92.0 lb

## 2014-04-25 DIAGNOSIS — N9089 Other specified noninflammatory disorders of vulva and perineum: Secondary | ICD-10-CM

## 2014-04-25 DIAGNOSIS — Z9189 Other specified personal risk factors, not elsewhere classified: Secondary | ICD-10-CM

## 2014-04-25 DIAGNOSIS — Z202 Contact with and (suspected) exposure to infections with a predominantly sexual mode of transmission: Secondary | ICD-10-CM

## 2014-04-25 NOTE — Patient Instructions (Signed)
Avoid shaving pubic/vaginal area.  Use trimmer instead once per week  Keep area clean and dry.    Herpes usually causes clear fluid filled vesicles in groups.    Will check blood work since after shaving no lesions left to culture and difficult now to identify what cause is.  Possible causes include shaving folliculitis, molluscum contagiosum or herpes simplex vesicles  Will call in 2-3 days with test results  No Sexual contact until all results known and any positives treated  Recommend safe sex practices as discussed                              Molluscum Contagiosum (Adult)  Your rash may be due to a common skin infection called molluscum contagiosum. It is caused by a virus. The infection results in raised, flesh-colored bumps on the skin. The bumps are sometimes itchy, but not painful. They may spread or form lines when scratched. Almost any area of skin can be affected. Common sites include the face, neck, armpit, arms, hands, and genitals.    Molluscum contagiosum spreads easily from one part of the body to another. It spreads through scratching or other contact. It can also spread from person to person. This often happens through shared clothing, towels, or objects such as shared sports gear. It has been known to spread during contact sports. It can also be spread by sexual contact.  Because it is due to a virus, antibiotics are not useful in treating the infection. The infection usually goes away on its own within a period of 6 to 18 months. In persons with decreased immune function (such as with diabetes, cancer, or HIV), the infection may persist.  If the bumps are bothersome or unsightly, treatment may be done to remove them. This may include scraping, freezing, or draining.  Home Care  Medications: The doctor can prescribe a pill or cream to help the bumps heal. Follow the doctor's instructions for using these medications.  General Care:   Avoid scratching the rash. Scratching spreads the infection.  If needed, cover affected skin with bandages to help prevent scratching.   Wash your hands before and after caring for the rash.   Do not share towels, washcloths, or clothing with anyone.   Avoid shaving any areas where the bumps are present.   Avoid sexual contact if the bumps are in the genital area.   If participating in contact sports or other activity that involves skin-to-skin contact, cover all affected skin with clothing or bandages.   Avoid swimming in public pools until the rash clears.  Follow Up  as advised by the doctor or our staff.  Get Prompt Medical Attention  if any of the following occurs:   Fever of 100.32F (38C) or higher   Signs of infection of affected skin (warmth, pain, oozing, or redness)   Bumps appear on a new area of the body or seem to be spreading rapidly   [address], [place], [place] [address]. All rights reserved. This information is not intended as a substitute for professional medical care. Always follow your healthcare professional's instructions.

## 2014-04-27 LAB — HSV, IGM I/II COMBINATION AND HSV 1/2 IGG, TYPE SPEC
HSV 1 IgG Type-Specific AB: 44.4 index — ABNORMAL HIGH (ref 0.00–0.90)
HSV 2 IgG Type-Specific Antibody: <0.91 index (ref 0.00–0.90)
HSV-1/2IgMTypeSpecificIgM-HerpeSelect R: 2.65 Ratio — ABNORMAL HIGH (ref 0.00–0.90)

## 2014-04-27 NOTE — Progress Notes (Signed)
Subjective:       Patient ID: [name] is a 28 y.o. female.  Chief Complaint   Patient presents with   . bumps, pain/bleeding in vaginal area     due to shaving x 5 days       Rash  This is a new problem. The current episode started in the past 7 days. The problem is unchanged. Pain location: vagina external. The rash is characterized by itchiness, pain and redness (bleeding after shaving). She was exposed to nothing. Pertinent negatives include no cough, fatigue, fever, rhinorrhea or shortness of breath. Past treatments include nothing.       The following portions of the patient's history were reviewed and updated as appropriate: allergies, current medications, past family history, past medical history, past social history, past surgical history and problem list.    Review of Systems   Constitutional: Negative for fever and fatigue.   HENT: Negative for rhinorrhea.    Respiratory: Negative for cough and shortness of breath.    Skin: Positive for rash.     All other review of systems negative outside of what is stated in HPI.          Objective:     Physical Exam   Nursing note and vitals reviewed.  Constitutional: She is oriented to person, place, and time. She appears well-developed and well-nourished.   HENT:   Head: Normocephalic and atraumatic.   Mouth/Throat: Oropharynx is clear and moist.   Eyes: Conjunctivae normal and EOM are normal. Pupils are equal, round, and reactive to light.   Neck: Normal range of motion. Neck supple.   Cardiovascular: Normal rate, regular rhythm and normal heart sounds.    Pulmonary/Chest: Effort normal and breath sounds normal.   Abdominal: Soft. Bowel sounds are normal. She exhibits no distension. There is no tenderness.   Genitourinary:       There is rash on the right labia.   Musculoskeletal: Normal range of motion.   Lymphadenopathy:     She has no cervical adenopathy.   Neurological: She is alert and oriented to person, place, and time.   Skin: Skin is warm and dry.  No rash noted.   Psychiatric: She has a normal mood and affect.           Assessment:       1. 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 Possible exposure to STD  Herpes Simplex Virus IgG + IgM           Plan:       Patient declined STD testing states she recently had testing done which was negative but ok with HSV testing as this wasn't done recently  Avoid shaving pubic/vaginal area.  Use trimmer instead once per week  Keep area clean and dry.    Herpes usually causes clear fluid filled vesicles in groups.    Will check blood work since after shaving no lesions left to culture and difficult now to identify what cause is.  Possible causes include shaving folliculitis, molluscum contagiosum or herpes simplex vesicles  Will call in 2-3 days with test results  No Sexual contact until all results known and any positives treated  Recommend safe sex practices as discussed

## 2014-05-02 ENCOUNTER — Ambulatory Visit (INDEPENDENT_AMBULATORY_CARE_PROVIDER_SITE_OTHER): Payer: BLUE CROSS/BLUE SHIELD | Admitting: Internal Medicine

## 2014-05-02 ENCOUNTER — Encounter (INDEPENDENT_AMBULATORY_CARE_PROVIDER_SITE_OTHER): Payer: Self-pay

## 2014-05-02 ENCOUNTER — Telehealth (INDEPENDENT_AMBULATORY_CARE_PROVIDER_SITE_OTHER): Payer: Self-pay

## 2014-05-02 VITALS — BP 118/78 | HR 96 | Temp 98.3°F | Resp 14 | Ht 61.0 in | Wt 92.0 lb

## 2014-05-02 DIAGNOSIS — Z7189 Other specified counseling: Secondary | ICD-10-CM

## 2014-05-02 DIAGNOSIS — Z712 Person consulting for explanation of examination or test findings: Secondary | ICD-10-CM

## 2014-05-02 NOTE — Progress Notes (Signed)
Encounter to discuss results  Results reviewed with patient.  All questions answered to patient's satisfaction.

## 2014-05-02 NOTE — Patient Instructions (Signed)
Cold Sore   A cold sore (also called fever blister) is a common viral infection around the lips. It is caused by the herpes simplex virus. There are two types of herpes simplex viruses (type 1 and type 2).Type 1 causes most cold sores, while type 2 causes most herpes genital infections.  A person usually gets the virus during early childhood by kissing or touching the cold sore of another child or adult. Most adults have been exposed to the virus, although only about one third will ever get a cold sore.  Because the virus remains in the body even after the sores heal, most children will have future outbreaks. The frequency of outbreaks varies with each child. Some will never have another outbreak. Others will have several a year.  A cold sore starts as a small group of painful blisters on the lip or inside the mouth. The lip blisters break open, dry up, and usually go away within one week. Different things can trigger an outbreak. These include:   Emotional stress   Another illness (cold, flu, or fever from any cause)   Heavy sun exposure   Overexertion and fatigue   Menstruation  Cold sores can be spread a few days before the onset of an outbreak to when the blisters have dried. Taking antiviral medication can shorten the time sores heal by about 1 to 2 days. If you get frequent sores, antiviral medications can be used to reduce the number of outbreaks.  Home Care   You may use acetaminophen (Tylenol) or ibuprofen (Motrin, Advil) to control pain or fever, unless another medicine was prescribed. NOTE: If you have chronic liver or kidney disease or ever had a stomach ulcer or GI bleeding, talk with your doctor before using these medicines. Aspirin should never be used in anyone under 36 years of age who is ill with a fever. It can cause severe liver damage.   Over-the-counter remedies such as Campho-Phenique or Anbesol may help relieve pain. For severe pain, apply an ice cube to the lip  sore for a few minutes at a time. Rinse the mouth with a glass of warm water mixed with a teaspoon of baking soda to relieve pain.   Avoid acidic foods (citrus fruits and tomatoes).   Avoid the things listed above that you know can trigger an outbreak.   Take any antiviral medications exactly as directed. For best results, start the medication at the first sign of an outbreak.   Do not touch the cold sore. Avoid touching your eyes so the virus does not spread there.   To avoid spreading the virus to others during an outbreak:   Horticulturist, commercial often.   Do not kiss.   Do not share utensils, towels, or toothbrushes.   Clean toys with a disinfectant.   Wear a hat and use zinc oxide or sunblock on your lips before going out in the sun.   Children with open draining lip sores should be kept out of school or daycare until the sore forms a scab.  Follow Up  with your doctor as advised by our staff.  Return Promptly  or contact your doctor if any of the following occur:   Eye pain, redness, or drainage from the eye   Headache, stiff neck   Inability to eat or drink due to pain   Unusual irritability, drowsiness, or confusion   [address] [place], [place] [address]. All rights reserved. This information is  not intended as a substitute for professional medical care. Always follow your healthcare professional's instructions.              Herpes: Caring for Sores  Good hygiene matters when you have herpes. Take care of your sores to speed healing. Neglected sores can lead to other infections.               Warm baths can ease itching and burning caused by sores.   To Ease Your Symptoms   Take any medications as directed.   Take aspirin or an aspirin substitute for pain.   Take warm baths to relieve itching of sores. And don't share towels when you have a sore.   Urinate in a tub of warm water to prevent burning. This works for women with genital herpes.   Don't wear tight  clothes or nylon underwear. They can trap moisture, cause chafing, and prevent sores from healing.  To Speed Healing   Wash the sores with mild soap and water. And wash your hands after you touch a sore.   Dry the affected area completely. Blow-drying the sores on low heat may help speed healing.   Don't bandage sores. The dry air helps them heal.   Avoid ointments unless they are prescribed. They retain moisture and may cause other infections.   Don't pick at the sores. This can slow healing.   Don't touch your eyes when you have a sore. The virus may travel from your fingertips to your eyes.  Resources  [employer] STD Hotline 816-167-9586 www.ashastd.org  Centers for Disease Control and Prevention 3102069379 [employer]    [address], [place], [place] [address]. All rights reserved. This information is not intended as a substitute for professional medical care. Always follow your healthcare professional's instructions.

## 2014-05-13 ENCOUNTER — Emergency Department: Payer: BLUE CROSS/BLUE SHIELD

## 2014-05-13 ENCOUNTER — Emergency Department
Admission: EM | Admit: 2014-05-13 | Discharge: 2014-05-13 | Disposition: A | Discharge status: Home / Self Care | Payer: BLUE CROSS/BLUE SHIELD | Attending: Emergency Medicine | Admitting: Emergency Medicine

## 2014-05-13 DIAGNOSIS — K5090 Crohn's disease, unspecified, without complications: Secondary | ICD-10-CM | POA: Insufficient documentation

## 2014-05-13 DIAGNOSIS — R079 Chest pain, unspecified: Secondary | ICD-10-CM | POA: Insufficient documentation

## 2014-05-13 DIAGNOSIS — F410 Panic disorder [episodic paroxysmal anxiety] without agoraphobia: Secondary | ICD-10-CM | POA: Insufficient documentation

## 2014-05-13 DIAGNOSIS — E063 Autoimmune thyroiditis: Secondary | ICD-10-CM | POA: Insufficient documentation

## 2014-05-13 LAB — CBC AND DIFFERENTIAL
Basophils Absolute Automated: 0.04 10*3/uL (ref 0.00–0.20)
Basophils Automated: 0 %
Eosinophils Absolute Automated: 0.12 10*3/uL (ref 0.00–0.70)
Eosinophils Automated: 2 %
Hematocrit: 36.3 % — ABNORMAL LOW (ref 37.0–47.0)
Hgb: 11.9 g/dL — ABNORMAL LOW (ref 12.0–16.0)
Immature Granulocytes Absolute: 0.02 10*3/uL
Immature Granulocytes: 0 %
Lymphocytes Absolute Automated: 3.58 10*3/uL (ref 0.50–4.40)
Lymphocytes Automated: 45 %
MCH: 30.8 pg (ref 28.0–32.0)
MCHC: 32.8 g/dL (ref 32.0–36.0)
MCV: 94 fL (ref 80.0–100.0)
MPV: 9.4 fL (ref 9.4–12.3)
Monocytes Absolute Automated: 0.49 10*3/uL (ref 0.00–1.20)
Monocytes: 6 %
Neutrophils Absolute: 3.74 10*3/uL (ref 1.80–8.10)
Neutrophils: 47 %
Nucleated RBC: 0 /100 WBC (ref 0–1)
Platelets: 402 10*3/uL — ABNORMAL HIGH (ref 140–400)
RBC: 3.86 10*6/uL — ABNORMAL LOW (ref 4.20–5.40)
RDW: 12 % (ref 12–15)
WBC: 7.97 10*3/uL (ref 3.50–10.80)

## 2014-05-13 LAB — BASIC METABOLIC PANEL
Anion Gap: 14 (ref 5.0–15.0)
BUN: 9 mg/dL (ref 7–19)
CO2: 19 mEq/L — ABNORMAL LOW (ref 22–29)
Calcium: 9.6 mg/dL (ref 8.5–10.5)
Chloride: 106 mEq/L (ref 100–111)
Creatinine: 0.9 mg/dL (ref 0.6–1.0)
Glucose: 80 mg/dL (ref 70–100)
Potassium: 3.2 mEq/L — ABNORMAL LOW (ref 3.5–5.1)
Sodium: 139 mEq/L (ref 136–145)

## 2014-05-13 LAB — HCG, SERUM, QUALITATIVE: Hcg Qualitative: NEGATIVE

## 2014-05-13 LAB — GFR: EGFR: >60

## 2014-05-13 MED ORDER — LORAZEPAM 2 MG/ML IJ SOLN
0.5000 mg | Freq: Once | INTRAMUSCULAR | Status: AC
Start: 2014-05-13 — End: 2014-05-13
  Administered 2014-05-13: 0.5 mg via INTRAVENOUS
  Filled 2014-05-13: qty 1

## 2014-05-13 MED ORDER — ONDANSETRON 4 MG PO TBDP
4.0000 mg | ORAL_TABLET | Freq: Four times a day (QID) | ORAL | Status: DC | PRN
Start: 2014-05-13 — End: 2014-05-22

## 2014-05-13 MED ORDER — SODIUM CHLORIDE 0.9 % IV BOLUS
1000.0000 mL | Freq: Once | INTRAVENOUS | Status: AC
Start: 2014-05-13 — End: 2014-05-13
  Administered 2014-05-13: 1000 mL via INTRAVENOUS

## 2014-05-13 MED ORDER — ONDANSETRON HCL 4 MG/2ML IJ SOLN
4.0000 mg | Freq: Once | INTRAMUSCULAR | Status: AC
Start: 2014-05-13 — End: 2014-05-13
  Administered 2014-05-13: 4 mg via INTRAVENOUS
  Filled 2014-05-13: qty 2

## 2014-05-13 NOTE — ED Provider Notes (Signed)
Physician/Midlevel provider first contact with patient: 05/13/14 1629         EMERGENCY DEPARTMENT HISTORY AND PHYSICAL EXAM    Patient Name: [name], [name]  Encounter Date:  05/13/2014  Attending Physician: Netta Neat, MD  Patient DOB:  May 01, 1986  MRN:  [id number]  Room:  17/B17    History of Presenting Illness     Historian: Pt    The patient [name] is a 28 y.o. female with h/o Crohn's disease, Hashimoto thyroiditis, and anxiety p/w a sudden onset of persistent midsternal CP described as tightness since 30-40 minutes ago while carrying several heavy objects up 3 flights of steps. Associated with SOB, nausea, lightheadedness, and dizziness. Pt reports her current sxs are consistent with previous panic attacks. She also c/o chronic abd pain, unchanged. No leg swelling.    PMD:  Schutter, Elenore Paddy, NP    Past Medical History     Past Medical History   Diagnosis Date   . Crohn's disease    . H/O Hashimoto thyroiditis    . Anxiety    . Disorder of thyroid        Past Surgical History     Past Surgical History   Procedure Laterality Date   . Breast implant         Family History     History reviewed. No pertinent family history.    Social History     History     Social History   . Marital Status: Legally Separated     Spouse Name: N/A     Number of Children: N/A   . Years of Education: N/A     Social History Main Topics   . Smoking status: Never Smoker    . Smokeless tobacco: Never Used   . Alcohol Use: Yes      Comment: occasionally   . Drug Use: No   . Sexual Activity: Not on file     Other Topics Concern   . Not on file     Social History Narrative       Home Medications     Home medications reviewed by ED MD at 4:29 PM     Previous Medications    ALPRAZOLAM (XANAX) 0.5 MG TABLET    Take 0.5 mg by mouth nightly as needed.    CLONAZEPAM (KLONOPIN) 0.5 MG TABLET    Take 0.5 mg by mouth 2 (two) times daily as needed.    CLONAZEPAM (KLONOPIN) 0.5 MG TABLET    Take 1 tablet (0.5 mg total) by mouth 2 (two) times  daily as needed for Anxiety.    FISH OIL-OMEGA-3 FATTY ACIDS 1000 MG CAPSULE    Take 2 g by mouth daily.    INFLIXIMAB (REMICADE IV)    Inject into the vein.    LEVOTHYROXINE (SYNTHROID, LEVOTHROID) 88 MCG TABLET    Take 88 mcg by mouth daily.    OXYCODONE-ACETAMINOPHEN (PERCOCET) 7.5-325 MG PER TABLET    Take 1 tablet by mouth every 4 (four) hours as needed for Pain.    ZOLPIDEM TARTRATE (AMBIEN PO)    Take 10 mg by mouth nightly as needed.          Review of Systems     CV:  +CP, No leg swelling  Resp:  +SOB  GI: +nausea, +abd pain  Neuro:  +lightheadedness, +dizziness  Psych:  +anxiety  All other systems reviewed and negative     Physical Exam     BP 117/63  Pulse 73   Temp(Src) 97.8 F (36.6 C)   Resp 14   Ht 1.549 m   Wt 42.185 kg   BMI 17.58 kg/m2     SpO2 94%   LMP 05/12/2014       Constitutional: Vital signs reviewed. Well appearing.  Head: Normocephalic, atraumatic  Eyes: Conjunctiva and sclera are normal.  No injection or discharge.  Ears, Nose, Throat:  Normal external examination of the nose and ears.    Neck: Normal range of motion. Trachea midline.  Respiratory/Chest: Clear to auscultation. No respiratory distress.   Cardiovascular: Regular rate and rhythm. No murmurs.   Abdomen:  No rebound or guarding. Soft.  Non-tender.  Back:    Upper Extremity:  No edema. No cyanosis.  Lower Extremity:  No edema. No cyanosis.  Skin: Warm and dry. No rash.  Psychiatric:  Anxious affect.  Normal insight.    ED Medications Administered     ED Medication Orders    Start     Status Ordering Provider    05/13/14 1638  sodium chloride 0.9 % bolus 1,000 mL   Once     Route: Intravenous  Ordered Dose: 1,000 mL     Last MAR action:  New Bag Cobey Raineri    05/13/14 1638  LORazepam (ATIVAN) injection 0.5 mg   Once     Route: Intravenous  Ordered Dose: 0.5 mg     Last MAR action:  Given Eland Lamantia    05/13/14 1638  ondansetron (ZOFRAN) injection 4 mg   Once     Route: Intravenous  Ordered Dose: 4 mg     Last MAR  action:  Given Odessie Polzin          Orders Placed During This Encounter     Orders Placed This Encounter   Procedures   . Chest AP Portable   . CBC and differential   . Basic Metabolic Panel   . hCG, Qualitative (Pos/Neg)   . GFR   . ECG 12 Lead       Diagnostic Study Results     The results of the diagnostic studies below were reviewed by the ED provider:    Labs  Results    Procedure Component Value Units Date/Time    Basic Metabolic Panel [[id number]]  (Abnormal) Collected:  05/13/14 1645    Specimen Information:  Blood Updated:  05/13/14 1716     Glucose 80 mg/dL      BUN 9 mg/dL      Creatinine 0.9 mg/dL      CALCIUM 9.6 mg/dL      Sodium 865 mEq/L      Potassium 3.2 (L) mEq/L      Chloride 106 mEq/L      CO2 19 (L) mEq/L      Anion Gap 14.0     GFR [[id number]] Collected:  05/13/14 1645     EGFR >60.0 Updated:  05/13/14 1716    hCG, Qualitative (Pos/Neg) [[id number]] Collected:  05/13/14 1645    Specimen Information:  Blood Updated:  05/13/14 1704     Hcg Qualitative Negative     CBC and differential [[id number]]  (Abnormal) Collected:  05/13/14 1645    Specimen Information:  Blood / Blood Updated:  05/13/14 1656     WBC 7.97 x10 3/uL      RBC 3.86 (L) x10 6/uL      Hgb 11.9 (L) g/dL      Hematocrit 44.0 (L) %  MCV 94.0 fL      MCH 30.8 pg      MCHC 32.8 g/dL      RDW 12 %      Platelets 402 (H) x10 3/uL      MPV 9.4 fL      Neutrophils 47 %      Lymphocytes Automated 45 %      Monocytes 6 %      Eosinophils Automated 2 %      Basophils Automated 0 %      Immature Granulocyte 0 %      Nucleated RBC 0 /100 WBC      Neutrophils Absolute 3.74 x10 3/uL      Abs Lymph Automated 3.58 x10 3/uL      Abs Mono Automated 0.49 x10 3/uL      Abs Eos Automated 0.12 x10 3/uL      Absolute Baso Automated 0.04 x10 3/uL      Absolute Immature Granulocyte 0.02 x10 3/uL           Radiologic Studies  Radiology Results (24 Hour)    Procedure Component Value Units Date/Time    Chest AP Portable [[id number]] Collected:  05/13/14 1728     Order Status:  Completed  Updated:  05/13/14 1733    Narrative:      CLINICAL HISTORY: Chest pain    TECHNIQUE: Portable upright AP view    COMPARISON: 11/03/2013    FINDINGS:  The lungs are clear. There is no pneumothorax or pleural  effusion. Cardiomediastinal contours are within normal limits for the AP  view. There are nipple ornaments bilaterally.       Impression:        1.  No acute findings.    Emeline Darling, MD   05/13/2014 5:29 PM            Scribe and MD Attestations       I, Netta Neat, MD, personally performed the services documented. Crisoforo Oxford is scribing for me on [place],Ronnita M. I reviewed and confirm the accuracy of the information in this medical record.    I, Crisoforo Oxford, am serving as a Neurosurgeon to document services personally performed by Netta Neat, MD, based on the provider's statements to me.     Rendering Provider: Netta Neat, MD      Monitors, EKG, Critical Care, and Splints   Cardiac Monitor (interpreted by ED physician):  NSR at 82 bpm.  EKG (interpreted by ED physician): NSR at 82 bpm. Normal axis. Short PR. No delta wave. No acute ST-T wave changes noted.  Critical Care:   Splint check:      MDM and Clinical Notes     The patient is clinically well appearing. Symptoms are not suggestive of pulmonary embolus, cardiac ischemia, aortic dissection, or other serious etiology. These diagnoses have been considered and excluded clinically.  Given the low risk of these diagnoses further testing and evaluation does not appear to be indicated at this time. Chest pain precautions were given. I emphasized the need for close follow-up with the patient's primary care physician, and that should the symptoms worsen or change in anyway that they are to return to the ER immediately for re-evaluation.    The patient's presentation is clinically very low pre-test probability for PE.    2+ radial pulse bilaterally.  No back pain.  No abdominal pain.  No aortic murmur.  TAD considered very low  probability in the context of history  and exam findings.     Sx fully resolved with IVF and Ativan.  No tachycardia or SOB.        Diagnosis and Disposition     Clinical Impression  1. Chest pain    2. Anxiety attack        Disposition  ED Disposition    Discharge Sherre Lain discharge to home/self care.    Condition at disposition: Stable            Prescriptions       New Prescriptions    ONDANSETRON (ZOFRAN ODT) 4 MG DISINTEGRATING TABLET    Take 1 tablet (4 mg total) by mouth every 6 (six) hours as needed for Nausea.             Isidoro Donning, MD  05/13/14 [id number]

## 2014-05-13 NOTE — ED Notes (Addendum)
Pt. Presents c/o with dizziness, chest pain, SOB, lightheadedness and nausea since 1600. States, "I was moving heavy boxes 3 flights of stairs and got overheated." Pt. Reports her pulse at home was 160. Pt. Shaking in triage.

## 2014-05-13 NOTE — Discharge Instructions (Signed)
Dear [name]:    I appreciate your choosing the [facility] for your healthcare needs, and hope your visit today was EXCELLENT.    Instructions:  Please follow-up with Dr. Dia Crawford on Monday.    Return to the Emergency Department for any worsening symptoms or concerns.    Below is some information that our patients often find helpful.    We wish you good health and please do not hesitate to contact us if we can ever be of any assistance.    Sincerely,  Isidoro Donning, MD  [facility]    ________________________________________________________________    If you do not continue to improve or your condition worsens, please contact your doctor or return immediately to the Emergency Department.    Thank you for choosing [facility] for your emergency care needs.  We strive to provide EXCELLENT care to you and your family.      DOCTOR REFERRALS  Call [phone] if you need any further referrals and we can help you find a primary care doctor or specialist.  Also, available online at:  [email or web address]    YOUR CONTACT INFORMATION  Before leaving please check with registration to make sure we have an up-to-date contact number.  You can call registration at [phone] to update your information.  For questions about your hospital bill, please call [phone].  For questions about your Emergency Dept Physician bill please call [phone].      FREE HEALTH SERVICES  If you need help with health or social services, please call 2-1-1 for a free referral to resources in your area.  2-1-1 is a free service connecting people with information on health insurance, free clinics, pregnancy, mental health, dental care, food assistance, housing, and substance abuse counseling.  Also, available online at:  http://www.[facility].org    MEDICAL RECORDS AND TESTS  Certain laboratory test results do not come back the same day, for example urine  cultures.   We will contact you if other important findings are noted.  Radiology films are often reviewed again to ensure accuracy.  If there is any discrepancy, we will notify you.      Please call [phone] to pick up a complimentary CD of any radiology studies performed.  If you or your doctor would like to request a copy of your medical records, please call [phone].      ORTHOPEDIC INJURY   Please know that significant injuries can exist even when an initial x-ray is read as normal or negative.  This can occur because some fractures (broken bones) are not initially visible on x-rays.  For this reason, close outpatient follow-up with your primary care doctor or bone specialist (orthopedist) is required.    MEDICATIONS AND FOLLOWUP  Please be aware that some prescription medications can cause drowsiness.  Use caution when driving or operating machinery.    The examination and treatment you have received in our Emergency Department is provided on an emergency basis, and is not intended to be a substitute for your primary care physician.  It is important that your doctor checks you again and that you report any new or remaining problems at that time.      24 HOUR PHARMACIES  [facility] - [address] [place], [place] [address] (1.4 miles, 7 minutes)  [facility] - [address], [place], [place] [address] (6.5 miles, 13 minutes)  Handout with directions available on request

## 2014-05-22 ENCOUNTER — Emergency Department: Payer: BLUE CROSS/BLUE SHIELD

## 2014-05-22 ENCOUNTER — Emergency Department
Admission: EM | Admit: 2014-05-22 | Discharge: 2014-05-22 | Disposition: A | Discharge status: Home / Self Care | Payer: BLUE CROSS/BLUE SHIELD | Attending: Internal Medicine | Admitting: Internal Medicine

## 2014-05-22 DIAGNOSIS — N12 Tubulo-interstitial nephritis, not specified as acute or chronic: Secondary | ICD-10-CM | POA: Insufficient documentation

## 2014-05-22 LAB — URINALYSIS WITH MICROSCOPIC
Bilirubin, UA: NEGATIVE
Blood, UA: NEGATIVE
Glucose, UA: NEGATIVE
Ketones UA: NEGATIVE
Nitrite, UA: NEGATIVE
Protein, UR: 30 — AB
Specific Gravity UA: 1.02 (ref 1.001–1.035)
Urine pH: 7 (ref 5.0–8.0)
Urobilinogen, UA: 0.2 mg/dL (ref 0.2–2.0)

## 2014-05-22 LAB — CBC AND DIFFERENTIAL
Hematocrit: 42.2 % (ref 37.0–47.0)
Hgb: 14.1 g/dL (ref 12.0–16.0)
MCH: 30.7 pg (ref 28.0–32.0)
MCHC: 33.4 g/dL (ref 32.0–36.0)
MCV: 91.9 fL (ref 80.0–100.0)
MPV: 9.2 fL — ABNORMAL LOW (ref 9.4–12.3)
Platelets: 363 10*3/uL (ref 140–400)
RBC: 4.59 10*6/uL (ref 4.20–5.40)
RDW: 12 % (ref 12–15)
WBC: 5.77 10*3/uL (ref 3.50–10.80)

## 2014-05-22 LAB — MAN DIFF ONLY
Atypical Lymphocytes %: 3 %
Atypical Lymphocytes Absolute: 0.17 10*3/uL — ABNORMAL HIGH
Band Neutrophils Absolute: 0 10*3/uL (ref 0.00–1.00)
Band Neutrophils: 0 %
Basophils Absolute Manual: 0.12 10*3/uL (ref 0.00–0.20)
Basophils Manual: 2 %
Eosinophils Absolute Manual: 0.06 10*3/uL (ref 0.00–0.70)
Eosinophils Manual: 1 %
Lymphocytes Absolute Manual: 1.9 10*3/uL (ref 0.50–4.40)
Lymphocytes Manual: 33 %
Monocytes Absolute: 0.63 10*3/uL (ref 0.00–1.20)
Monocytes Manual: 11 %
Neutrophils Absolute Manual: 2.89 10*3/uL (ref 1.80–8.10)
Nucleated RBC: 0 /100 WBC (ref 0–1)
Segmented Neutrophils: 50 %

## 2014-05-22 LAB — BASIC METABOLIC PANEL
Anion Gap: 13 (ref 5.0–15.0)
BUN: 9.3 mg/dL (ref 7.0–19.0)
CO2: 23 mEq/L (ref 22–29)
Calcium: 10 mg/dL (ref 8.5–10.5)
Chloride: 105 mEq/L (ref 100–111)
Creatinine: 0.8 mg/dL (ref 0.6–1.0)
Glucose: 105 mg/dL — ABNORMAL HIGH (ref 70–100)
Potassium: 4 mEq/L (ref 3.5–5.1)
Sodium: 141 mEq/L (ref 136–145)

## 2014-05-22 LAB — HEPATIC FUNCTION PANEL
ALT: 25 U/L (ref 0–55)
AST (SGOT): 28 U/L (ref 5–34)
Albumin/Globulin Ratio: 1.2 (ref 0.9–2.2)
Albumin: 4.3 g/dL (ref 3.5–5.0)
Alkaline Phosphatase: 67 U/L (ref 37–106)
Bilirubin Direct: 0.2 mg/dL (ref 0.0–0.5)
Bilirubin Indirect: 0.4 mg/dL (ref 0.0–1.1)
Bilirubin, Total: 0.6 mg/dL (ref 0.2–1.2)
Globulin: 3.6 g/dL (ref 2.0–3.6)
Protein, Total: 7.9 g/dL (ref 6.0–8.3)

## 2014-05-22 LAB — GFR: EGFR: >60

## 2014-05-22 LAB — URINE HCG QUALITATIVE: Urine HCG Qualitative: NEGATIVE

## 2014-05-22 MED ORDER — HYDROMORPHONE HCL PF 1 MG/ML IJ SOLN
1.0000 mg | Freq: Once | INTRAMUSCULAR | Status: AC
Start: 2014-05-22 — End: 2014-05-22
  Administered 2014-05-22: 1 mg via INTRAVENOUS
  Filled 2014-05-22: qty 1

## 2014-05-22 MED ORDER — OXYCODONE-ACETAMINOPHEN 5-325 MG PO TABS
ORAL_TABLET | ORAL | Status: DC
Start: 2014-05-22 — End: 2014-08-11

## 2014-05-22 MED ORDER — KETOROLAC TROMETHAMINE 30 MG/ML IJ SOLN
15.0000 mg | Freq: Once | INTRAMUSCULAR | Status: AC
Start: 2014-05-22 — End: 2014-05-22
  Administered 2014-05-22: 15 mg via INTRAVENOUS
  Filled 2014-05-22: qty 1

## 2014-05-22 MED ORDER — ONDANSETRON HCL 4 MG/2ML IJ SOLN
4.0000 mg | Freq: Once | INTRAMUSCULAR | Status: AC
Start: 2014-05-22 — End: 2014-05-22
  Administered 2014-05-22: 4 mg via INTRAVENOUS
  Filled 2014-05-22: qty 2

## 2014-05-22 MED ORDER — CEFDINIR 300 MG PO CAPS
300.0000 mg | ORAL_CAPSULE | Freq: Two times a day (BID) | ORAL | Status: DC
Start: 2014-05-22 — End: 2014-08-24

## 2014-05-22 MED ORDER — SODIUM CHLORIDE 0.9 % IV SOLN
50.0000 mL/h | INTRAVENOUS | Status: DC
Start: 2014-05-22 — End: 2014-05-22

## 2014-05-22 MED ORDER — SODIUM CHLORIDE 0.9 % IV BOLUS
1000.0000 mL | Freq: Once | INTRAVENOUS | Status: AC
Start: 2014-05-22 — End: 2014-05-22
  Administered 2014-05-22: 1000 mL via INTRAVENOUS

## 2014-05-22 MED ORDER — SODIUM CHLORIDE 0.9 % IV MBP
1.0000 g | Freq: Once | INTRAVENOUS | Status: AC
Start: 2014-05-22 — End: 2014-05-22
  Administered 2014-05-22: 1 g via INTRAVENOUS
  Filled 2014-05-22: qty 1000

## 2014-05-22 NOTE — ED Notes (Signed)
Onset last night. Bilateral flank pain for one hour, then it came back about one hour ago.

## 2014-05-22 NOTE — Discharge Instructions (Signed)
Pyelonephritis     You have been diagnosed with pyelonephritis. This is also known as a kidney infection.     Pyelonephritis is a bacterial infection in the kidneys. Some symptoms are: Fever (temperature higher than 100.4ºF / 38ºC), chills, nausea (sick to the stomach) and vomiting (throwing up). Back pain on one or both sides is another symptom. There is sometimes trouble urinating (peeing). You may also feel very weak. The diagnosis is made based on your symptoms and a test of your urine.     Pyelonephritis most happens most often when a lower urinary tract infection (bladder infection) climbs up the urinary tract and gets into the kidney. It is important to treat a bladder infection early. This is to prevent pyelonephritis and possible kidney damage.     Pyelonephritis is treated with antibiotics, pain and fever medicines and fluids. Some patients need an "IV" if they have nausea (sick to the stomach) or vomiting and cannot keep down the antibiotics, or if they aren’t getting better after 2-3 days of oral (by mouth) medications. Most pyelonephritis patients do not need to check into the hospital. A few patients who don’t do well with the oral (by mouth) antibiotics or get sicker despite treatment may need to get admitted to the hospital.     YOU SHOULD SEEK MEDICAL ATTENTION IMMEDIATELY, EITHER HERE OR AT THE NEAREST EMERGENCY DEPARTMENT, IF ANY OF THE FOLLOWING OCCURS:  · Failure to improve after 2-3 days of antibiotics.  · Nausea or vomiting develops and you cannot to keep down medicines or fluids.  · Increased weakness or lightheadedness.  · Any progressive or worsening symptoms or any other concerns develop.

## 2014-05-22 NOTE — ED Provider Notes (Signed)
Physician/Midlevel provider first contact with patient: 05/22/14 1326         History     Chief Complaint   Patient presents with   . Flank Pain     bilateral, right more than left     Patient is a 28 y.o. female presenting with flank pain. The history is provided by the patient. No language interpreter was used.   Flank Pain  This is a new problem. The current episode started yesterday.        [name] is a 28 y.o. female who presents with intermittent bilateral flank pain since last night (R>L). Reports that it went away but returned 1.5 hours ago and has been constant since then. Pain increases with palpation to the areas. Denies associated nausea, vomiting, diarrhea, or urinary symptoms. No chest pain or cough. LMP: 1 week ago and was normal. Reports that she has had similar pain in the past and that "they thought it was kidney stones." No other complaints.      Past Medical History   Diagnosis Date   . Crohn's disease    . H/O Hashimoto thyroiditis    . Anxiety    . Disorder of thyroid        Past Surgical History   Procedure Laterality Date   . Breast implant         No family history on file.    Social  History   Substance Use Topics   . Smoking status: Never Smoker    . Smokeless tobacco: Never Used   . Alcohol Use: Yes      Comment: occasionally       .     Allergies   Allergen Reactions   . Sulfa Antibiotics      rash   . Bactrim [Sulfamethoxazole W/Trimethoprim (Co-Trimoxazole)] Rash       Discharge Medication List as of 05/22/2014  4:27 PM      CONTINUE these medications which have NOT CHANGED    Details   !! clonazePAM (KLONOPIN) 0.5 MG tablet Take 0.5 mg by mouth 2 (two) times daily as needed., Until Discontinued, Historical Med      !! clonazePAM (KLONOPIN) 0.5 MG tablet Take 1 tablet (0.5 mg total) by mouth 2 (two) times daily as needed for Anxiety., Starting 03/23/2014, Until Discontinued, Print      fish oil-omega-3 fatty acids 1000 MG capsule Take 2 g by mouth daily., Until Discontinued,  Historical Med      InFLIXimab (REMICADE IV) Inject into the vein., Until Discontinued, Historical Med      levothyroxine (SYNTHROID, LEVOTHROID) 88 MCG tablet Take 88 mcg by mouth daily., Until Discontinued, Historical Med      Zolpidem Tartrate (AMBIEN PO) Take 10 mg by mouth nightly as needed.   , Until Discontinued, Historical Med       !! - Potential duplicate medications found. Please discuss with provider.           Review of Systems   Genitourinary: Positive for flank pain.   All other systems reviewed and are negative.      Physical Exam    BP: 127/80 mmHg, Heart Rate: 122, Temp: 98.7 F (37.1 C), Resp Rate: 16, SpO2: 98 %, Weight: 41.277 kg    Physical Exam   Constitutional: She is oriented to person, place, and time. She appears well-developed and well-nourished.   HENT:   Head: Normocephalic.   Eyes: Conjunctivae are normal. Pupils are equal, round, and  reactive to light. No scleral icterus.   Neck: Normal range of motion. Neck supple.   Cardiovascular: Regular rhythm, normal heart sounds and intact distal pulses.    Pulmonary/Chest: Effort normal and breath sounds normal.   Abdominal: Soft. Bowel sounds are normal. There is no hepatosplenomegaly. There is no tenderness. There is no rigidity. No hernia.   Bilateral cvat more on right   Musculoskeletal: Normal range of motion. She exhibits no edema or tenderness.   Neurological: She is alert and oriented to person, place, and time. She exhibits normal muscle tone.   Skin: Skin is warm and dry. No rash noted. She is not diaphoretic.   Psychiatric: She has a normal mood and affect. Her behavior is normal.   Nursing note and vitals reviewed.      MDM and ED Course     ED Medication Orders    Start     Status Ordering Provider    05/22/14 1554  cefTRIAXone (ROCEPHIN) 1 g in sodium chloride 0.9 % 50 mL IVPB mini-bag plus   Once     Route: Intravenous  Ordered Dose: 1 g     Last MAR action:  New Bag Georgiann Cocker    05/22/14 1428  HYDROmorphone  (DILAUDID) injection 1 mg   Once     Route: Intravenous  Ordered Dose: 1 mg     Last MAR action:  Given Georgiann Cocker    05/22/14 1428  ketorolac (TORADOL) injection 15 mg   Once     Route: Intravenous  Ordered Dose: 15 mg     Last MAR action:  Given Georgiann Cocker    05/22/14 1333  sodium chloride 0.9 % bolus 1,000 mL   Once     Route: Intravenous  Ordered Dose: 1,000 mL     Last MAR action:  Stopped Georgiann Cocker    05/22/14 1333  ondansetron (ZOFRAN) injection 4 mg   Once     Route: Intravenous  Ordered Dose: 4 mg     Last MAR action:  Given Georgiann Cocker    05/22/14 1333  HYDROmorphone (DILAUDID) injection 1 mg   Once     Route: Intravenous  Ordered Dose: 1 mg     Last MAR action:  Given Georgiann Cocker    05/22/14 1326     Continuous,   Status:  Discontinued     Route: Intravenous  Ordered Dose: 50 mL/hr     Discontinued Isobel Eisenhuth W           MDM  Flank pain no abd pain, stone but bilateral pain, urinary infection, other posterior abd problems. No signs of ms back disease.     Feels much better at discharge minimal discomfort vss      Procedures    Clinical Impression & Disposition     Clinical Impression  Final diagnoses:   Pyelonephritis        ED Disposition    Discharge [name] discharge to home/self care.    Condition at disposition: Stable             Discharge Medication List as of 05/22/2014  4:27 PM      START taking these medications    Details   cefdinir (OMNICEF) 300 MG capsule Take 1 capsule (300 mg total) by mouth 2 (two) times daily., Starting 05/22/2014, Until Discontinued, Print      oxyCODONE-acetaminophen (PERCOCET) 5-325 MG per tablet 1-2 tablets by mouth every  4-6 hours as needed for pain;  Do not drive or operate machinery while taking this medicine, Print              Treatment Team: Scribe: Joaquim Lai    I was acting as a Neurosurgeon for Georgiann Cocker, MD on [facility] M  Treatment Team: Scribe: Joaquim Lai   I am the first  provider for this patient and I personally performed the services documented. Treatment Team: Scribe: Joaquim Lai is scribing for me on Kretz,Alyssabeth M. This note accurately reflects work and decisions made by me.  Georgiann Cocker, MD      Georgiann Cocker, MD  05/22/14 [id number]

## 2014-08-01 ENCOUNTER — Emergency Department
Admission: EM | Admit: 2014-08-01 | Discharge: 2014-08-01 | Disposition: A | Discharge status: Home / Self Care | Payer: 59 | Attending: Emergency Medical Services | Admitting: Emergency Medical Services

## 2014-08-01 ENCOUNTER — Emergency Department: Payer: 59

## 2014-08-01 DIAGNOSIS — E063 Autoimmune thyroiditis: Secondary | ICD-10-CM | POA: Insufficient documentation

## 2014-08-01 DIAGNOSIS — R1030 Lower abdominal pain, unspecified: Secondary | ICD-10-CM

## 2014-08-01 DIAGNOSIS — R109 Unspecified abdominal pain: Secondary | ICD-10-CM | POA: Insufficient documentation

## 2014-08-01 DIAGNOSIS — K5090 Crohn's disease, unspecified, without complications: Secondary | ICD-10-CM | POA: Insufficient documentation

## 2014-08-01 DIAGNOSIS — O99891 Other specified diseases and conditions complicating pregnancy: Secondary | ICD-10-CM | POA: Insufficient documentation

## 2014-08-01 DIAGNOSIS — N390 Urinary tract infection, site not specified: Secondary | ICD-10-CM

## 2014-08-01 LAB — BASIC METABOLIC PANEL
Anion Gap: 7 (ref 5.0–15.0)
BUN: 7 mg/dL (ref 7–19)
CO2: 22 mEq/L (ref 22–29)
Calcium: 9.6 mg/dL (ref 8.5–10.5)
Chloride: 105 mEq/L (ref 100–111)
Creatinine: 0.8 mg/dL (ref 0.6–1.0)
Glucose: 112 mg/dL — ABNORMAL HIGH (ref 70–100)
Potassium: 3.5 mEq/L (ref 3.5–5.1)
Sodium: 134 mEq/L — ABNORMAL LOW (ref 136–145)

## 2014-08-01 LAB — URINALYSIS, REFLEX TO MICROSCOPIC EXAM IF INDICATED
Bilirubin, UA: NEGATIVE
Blood, UA: NEGATIVE
Glucose, UA: NEGATIVE
Nitrite, UA: NEGATIVE
Protein, UR: NEGATIVE
Specific Gravity UA: 1.015 (ref 1.001–1.035)
Urine pH: 6 (ref 5.0–8.0)
Urobilinogen, UA: NORMAL mg/dL

## 2014-08-01 LAB — GFR: EGFR: >60

## 2014-08-01 LAB — CBC AND DIFFERENTIAL
Basophils Absolute Automated: 0.02 10*3/uL (ref 0.00–0.20)
Basophils Automated: 0 %
Eosinophils Absolute Automated: 0.06 10*3/uL (ref 0.00–0.70)
Eosinophils Automated: 0 %
Hematocrit: 38.7 % (ref 37.0–47.0)
Hgb: 13.5 g/dL (ref 12.0–16.0)
Immature Granulocytes Absolute: 0.04 10*3/uL
Immature Granulocytes: 0 %
Lymphocytes Absolute Automated: 2.64 10*3/uL (ref 0.50–4.40)
Lymphocytes Automated: 19 %
MCH: 32.5 pg — ABNORMAL HIGH (ref 28.0–32.0)
MCHC: 34.9 g/dL (ref 32.0–36.0)
MCV: 93 fL (ref 80.0–100.0)
MPV: 9.1 fL — ABNORMAL LOW (ref 9.4–12.3)
Monocytes Absolute Automated: 1.22 10*3/uL — ABNORMAL HIGH (ref 0.00–1.20)
Monocytes: 9 %
Neutrophils Absolute: 9.89 10*3/uL — ABNORMAL HIGH (ref 1.80–8.10)
Neutrophils: 72 %
Nucleated RBC: 0 /100 WBC (ref 0–1)
Platelets: 369 10*3/uL (ref 140–400)
RBC: 4.16 10*6/uL — ABNORMAL LOW (ref 4.20–5.40)
RDW: 13 % (ref 12–15)
WBC: 13.83 10*3/uL — ABNORMAL HIGH (ref 3.50–10.80)

## 2014-08-01 LAB — HCG, SERUM, QUALITATIVE: Hcg Qualitative: POSITIVE — AB

## 2014-08-01 LAB — HCG QUANTITATIVE: hCG, Quant.: 54746.7

## 2014-08-01 MED ORDER — ONDANSETRON HCL 4 MG/2ML IJ SOLN
INTRAMUSCULAR | Status: AC
Start: 2014-08-01 — End: 2014-08-01
  Administered 2014-08-01: 4 mg
  Filled 2014-08-01: qty 2

## 2014-08-01 MED ORDER — OXYCODONE-ACETAMINOPHEN 5-325 MG PO TABS
ORAL_TABLET | ORAL | Status: DC
Start: 2014-08-01 — End: 2014-08-11

## 2014-08-01 MED ORDER — HYDROMORPHONE HCL PF 1 MG/ML IJ SOLN
1.0000 mg | Freq: Once | INTRAMUSCULAR | Status: AC
Start: 2014-08-01 — End: 2014-08-01
  Administered 2014-08-01: 1 mg via INTRAVENOUS
  Filled 2014-08-01: qty 1

## 2014-08-01 MED ORDER — SODIUM CHLORIDE 0.9 % IV BOLUS
1000.0000 mL | Freq: Once | INTRAVENOUS | Status: AC
Start: 2014-08-01 — End: 2014-08-01
  Administered 2014-08-01: 1000 mL via INTRAVENOUS

## 2014-08-01 MED ORDER — CEFDINIR 300 MG PO CAPS
300.0000 mg | ORAL_CAPSULE | Freq: Two times a day (BID) | ORAL | Status: DC
Start: 2014-08-01 — End: 2014-08-24

## 2014-08-01 MED ORDER — ONDANSETRON 4 MG PO TBDP
4.0000 mg | ORAL_TABLET | Freq: Three times a day (TID) | ORAL | Status: DC | PRN
Start: 2014-08-01 — End: 2014-08-24

## 2014-08-01 MED ORDER — ONDANSETRON HCL 4 MG/2ML IJ SOLN
4.0000 mg | Freq: Once | INTRAMUSCULAR | Status: AC
Start: 2014-08-01 — End: 2014-08-01
  Administered 2014-08-01: 4 mg via INTRAVENOUS
  Filled 2014-08-01: qty 2

## 2014-08-01 MED ORDER — HYDROMORPHONE HCL PF 1 MG/ML IJ SOLN
INTRAMUSCULAR | Status: AC
Start: 2014-08-01 — End: 2014-08-01
  Administered 2014-08-01: 1 mg
  Filled 2014-08-01: qty 1

## 2014-08-01 NOTE — ED Notes (Signed)
Pt arrives to ED c/o LLQ abdominal pain that radiates across the lower abdomen x 1 week.  +nausea, 1 episode of diarrhea, bilateral flank pain.  Denies fevers, vomiting, vaginal bleeding.  Pt reports she is approx. 4-[redacted] weeks pregnant.  +dizziness.  Pt has history of crohns and reports this could be a flare up but is unsure.  Pt calm in triage, NAD.

## 2014-08-01 NOTE — ED Provider Notes (Signed)
Physician/Midlevel provider first contact with patient: 08/01/14 1001         History     Chief Complaint   Patient presents with   . Abdominal Pain     HPI  patient complains of left sided abdominal pain times one week.  Patient states that she does have history of Crohn's disease but current complaint is not similar in presentation to past Crohn's disease.  Patient states that she may also be pregnant.  Approximately 4 to 5 weeks.  Patient has noted clear vaginal discharge history of similar discharge associated pregnancy in the past.  Patient denies any vaginal bleeding.  Patient describes the abdominal pain as a cramping in nature with no radiating pain but has noted some left CVA tenderness.  Patient denies any urinary symptoms.  No fever.  No chills.  Patient denies any chest pain.  No dyspnea.  No shortness of breath.  Patient denies any radiation of pain.  Patient denies any headache.  No visual disturbance.  Patient denies any numbness.  No tingling sensation.  Patient denies any other complaints.     Past Medical History   Diagnosis Date   . Crohn's disease    . H/O Hashimoto thyroiditis    . Anxiety    . Disorder of thyroid        Past Surgical History   Procedure Laterality Date   . Breast implant         History reviewed. No pertinent family history.    Social  History   Substance Use Topics   . Smoking status: Never Smoker    . Smokeless tobacco: Never Used   . Alcohol Use: Yes      Comment: occasionally       .     Allergies   Allergen Reactions   . Sulfa Antibiotics      rash   . Bactrim [Sulfamethoxazole W/Trimethoprim (Co-Trimoxazole)] Rash       Current/Home Medications    ALPRAZOLAM (XANAX PO)    Take by mouth.    CEFDINIR (OMNICEF) 300 MG CAPSULE    Take 1 capsule (300 mg total) by mouth 2 (two) times daily.    CLONAZEPAM (KLONOPIN) 0.5 MG TABLET    Take 0.5 mg by mouth 2 (two) times daily as needed.    CLONAZEPAM (KLONOPIN) 0.5 MG TABLET    Take 1 tablet (0.5 mg total) by mouth 2 (two) times  daily as needed for Anxiety.    FISH OIL-OMEGA-3 FATTY ACIDS 1000 MG CAPSULE    Take 2 g by mouth daily.    IBUPROFEN (MOTRIN PO)    Take by mouth.    INFLIXIMAB (REMICADE IV)    Inject into the vein.    LEVOTHYROXINE (SYNTHROID, LEVOTHROID) 88 MCG TABLET    Take 88 mcg by mouth daily.    OXYCODONE-ACETAMINOPHEN (PERCOCET) 5-325 MG PER TABLET    1-2 tablets by mouth every 4-6 hours as needed for pain;  Do not drive or operate machinery while taking this medicine    ZOLPIDEM TARTRATE (AMBIEN PO)    Take 10 mg by mouth nightly as needed.           Review of Systems   Constitutional: Negative for fever and chills.   HENT: Negative for sore throat.    Respiratory: Negative for cough, shortness of breath, wheezing and stridor.    Cardiovascular: Negative for chest pain.   Gastrointestinal: Positive for nausea. Negative for vomiting and blood in stool.  Genitourinary: Negative for frequency.   Neurological: Negative for light-headedness, numbness and headaches.   All other systems reviewed and are negative.      Physical Exam    BP: 140/64 mmHg, Heart Rate: 95, Temp: 98.1 F (36.7 C), Resp Rate: 12, SpO2: 100 %, Weight: 41.731 kg    Physical Exam   Constitutional: She is oriented to person, place, and time. She appears well-developed and well-nourished. No distress.   HENT:   Nose: Nose normal.   Mouth/Throat: No oropharyngeal exudate.   Eyes: Conjunctivae and EOM are normal. Pupils are equal, round, and reactive to light.   Neck: Normal range of motion. Neck supple.   Cardiovascular: Normal rate, regular rhythm and normal heart sounds.    Pulmonary/Chest: Effort normal and breath sounds normal. No respiratory distress. She has no wheezes. She has no rales.   Abdominal: Soft. Bowel sounds are normal. She exhibits no distension. There is no rebound and no guarding.   There is tenderness palpation to left upper and lower quadrant and also onto the suprapubic region.  No guarding.  No rebound.   Musculoskeletal: Normal  range of motion. She exhibits no edema or tenderness.   Nontender to CVA bilaterally.   Neurological: She is alert and oriented to person, place, and time.   Vitals reviewed.      MDM and ED Course     ED Medication Orders    Start     Status Ordering Provider    08/01/14 1201  HYDROmorphone (DILAUDID) injection 1 mg   Once     Route: Intravenous  Ordered Dose: 1 mg     Last MAR action:  Given Krystel Fletchall JIN    08/01/14 1201  ondansetron (ZOFRAN) injection 4 mg   Once     Route: Intravenous  Ordered Dose: 4 mg     Last MAR action:  Given Jet Traynham JIN    08/01/14 1024  HYDROmorphone (DILAUDID) 1 MG/ML injection     Comments:  Created by cabinet override    Last MAR action:  Given     08/01/14 1024  ondansetron (ZOFRAN) 4 MG/2ML injection     Comments:  Created by cabinet override    Last MAR action:  Given     08/01/14 1010  sodium chloride 0.9 % bolus 1,000 mL   Once     Route: Intravenous  Ordered Dose: 1,000 mL     Last MAR action:  [employer], [place] L           MDM  patient states that she feels much better at this time.  Patient is informed the results of the workup including negative ultrasound with single live intrauterine pregnancy at 6 weeks consistent with the beta Quant hCG.  Patient is informed the urinalysis does demonstrate urinary tract infection.  Patient has taken Omnicef in the past with urinary tract infection.  Patient declines to have CT of abdomen at this time secondary to that fact that today's pain is not similar to past pain associated gross disease.  Patient denies any vomiting nor any changes to bowel movement.  Patient was strongly advised to follow-up with primary care doctor once 2 days otherwise patient be discharged with pain medication and to also to return to emergency department immediately if symptoms worse or changes.  Patient states understanding and agrees to treatment plan.      Procedures    Clinical Impression & Disposition     Clinical Impression  Final  diagnoses:   UTI  (lower urinary tract infection)   Lower abdominal pain        ED Disposition    Discharge Sherre Lain discharge to home/self care.    Condition at disposition: Stable             New Prescriptions    CEFDINIR (OMNICEF) 300 MG CAPSULE    Take 1 capsule (300 mg total) by mouth 2 (two) times daily.    ONDANSETRON (ZOFRAN ODT) 4 MG DISINTEGRATING TABLET    Take 1 tablet (4 mg total) by mouth every 8 (eight) hours as needed for Nausea.    OXYCODONE-ACETAMINOPHEN (PERCOCET) 5-325 MG PER TABLET    1-2 tablets by mouth every 4-6 hours as needed for pain;  Do not drive or operate machinery while taking this medicine                 Abbey Chatters, [place]  08/01/14 1332

## 2014-08-11 ENCOUNTER — Emergency Department
Admission: EM | Admit: 2014-08-11 | Discharge: 2014-08-11 | Disposition: A | Discharge status: Home / Self Care | Payer: 59 | Attending: Emergency Medicine | Admitting: Emergency Medicine

## 2014-08-11 ENCOUNTER — Emergency Department: Payer: 59

## 2014-08-11 DIAGNOSIS — R109 Unspecified abdominal pain: Secondary | ICD-10-CM | POA: Insufficient documentation

## 2014-08-11 DIAGNOSIS — N939 Abnormal uterine and vaginal bleeding, unspecified: Secondary | ICD-10-CM

## 2014-08-11 DIAGNOSIS — N898 Other specified noninflammatory disorders of vagina: Secondary | ICD-10-CM | POA: Insufficient documentation

## 2014-08-11 DIAGNOSIS — E079 Disorder of thyroid, unspecified: Secondary | ICD-10-CM | POA: Insufficient documentation

## 2014-08-11 LAB — URINALYSIS, REFLEX TO MICROSCOPIC EXAM IF INDICATED
Bilirubin, UA: NEGATIVE
Glucose, UA: NEGATIVE
Ketones UA: NEGATIVE
Leukocyte Esterase, UA: NEGATIVE
Nitrite, UA: NEGATIVE
Protein, UR: NEGATIVE
Specific Gravity UA: 1.011 (ref 1.001–1.035)
Urine pH: 7 (ref 5.0–8.0)
Urobilinogen, UA: NORMAL mg/dL

## 2014-08-11 LAB — CBC AND DIFFERENTIAL
Basophils Absolute Automated: 0.03 10*3/uL (ref 0.00–0.20)
Basophils Automated: 0 %
Eosinophils Absolute Automated: 0.04 10*3/uL (ref 0.00–0.70)
Eosinophils Automated: 0 %
Hematocrit: 38 % (ref 37.0–47.0)
Hgb: 12.9 g/dL (ref 12.0–16.0)
Immature Granulocytes Absolute: 0.03 10*3/uL
Immature Granulocytes: 0 %
Lymphocytes Absolute Automated: 2.05 10*3/uL (ref 0.50–4.40)
Lymphocytes Automated: 17 %
MCH: 32.2 pg — ABNORMAL HIGH (ref 28.0–32.0)
MCHC: 33.9 g/dL (ref 32.0–36.0)
MCV: 94.8 fL (ref 80.0–100.0)
MPV: 9.3 fL — ABNORMAL LOW (ref 9.4–12.3)
Monocytes Absolute Automated: 0.84 10*3/uL (ref 0.00–1.20)
Monocytes: 7 %
Neutrophils Absolute: 8.87 10*3/uL — ABNORMAL HIGH (ref 1.80–8.10)
Neutrophils: 75 %
Nucleated RBC: 0 /100 WBC (ref 0–1)
Platelets: 440 10*3/uL — ABNORMAL HIGH (ref 140–400)
RBC: 4.01 10*6/uL — ABNORMAL LOW (ref 4.20–5.40)
RDW: 13 % (ref 12–15)
WBC: 11.83 10*3/uL — ABNORMAL HIGH (ref 3.50–10.80)

## 2014-08-11 LAB — COMPREHENSIVE METABOLIC PANEL
ALT: 40 U/L (ref 0–55)
AST (SGOT): 33 U/L (ref 5–34)
Albumin/Globulin Ratio: 1.1 (ref 0.9–2.2)
Albumin: 4.1 g/dL (ref 3.5–5.0)
Alkaline Phosphatase: 69 U/L (ref 37–106)
Anion Gap: 11 (ref 5.0–15.0)
BUN: 10 mg/dL (ref 7–19)
Bilirubin, Total: 0.5 mg/dL (ref 0.2–1.2)
CO2: 22 mEq/L (ref 22–29)
Calcium: 10.1 mg/dL (ref 8.5–10.5)
Chloride: 105 mEq/L (ref 100–111)
Creatinine: 0.8 mg/dL (ref 0.6–1.0)
Globulin: 3.8 g/dL — ABNORMAL HIGH (ref 2.0–3.6)
Glucose: 81 mg/dL (ref 70–100)
Potassium: 4 mEq/L (ref 3.5–5.1)
Protein, Total: 7.9 g/dL (ref 6.0–8.3)
Sodium: 138 mEq/L (ref 136–145)

## 2014-08-11 LAB — GFR: EGFR: >60

## 2014-08-11 LAB — HCG QUANTITATIVE: hCG, Quant.: 1709.4

## 2014-08-11 LAB — TYPE AND SCREEN
AB Screen Gel: NEGATIVE
ABO Rh: O POS

## 2014-08-11 MED ORDER — ONDANSETRON HCL 4 MG/2ML IJ SOLN
4.0000 mg | Freq: Once | INTRAMUSCULAR | Status: AC
Start: 2014-08-11 — End: 2014-08-11
  Administered 2014-08-11: 4 mg via INTRAVENOUS
  Filled 2014-08-11: qty 2

## 2014-08-11 MED ORDER — OXYCODONE-ACETAMINOPHEN 5-325 MG PO TABS
ORAL_TABLET | ORAL | Status: DC
Start: 2014-08-11 — End: 2014-08-24

## 2014-08-11 MED ORDER — SODIUM CHLORIDE 0.9 % IV BOLUS
1000.0000 mL | Freq: Once | INTRAVENOUS | Status: AC
Start: 2014-08-11 — End: 2014-08-11
  Administered 2014-08-11: 1000 mL via INTRAVENOUS

## 2014-08-11 MED ORDER — HYDROMORPHONE HCL PF 1 MG/ML IJ SOLN
1.0000 mg | Freq: Once | INTRAMUSCULAR | Status: AC
Start: 2014-08-11 — End: 2014-08-11
  Administered 2014-08-11: 1 mg via INTRAVENOUS
  Filled 2014-08-11: qty 1

## 2014-08-11 NOTE — ED Notes (Signed)
Pt had an abortion last Saturday 8/8.  Pt has had constant abdominal pain since the procedure.  Pt has been taking motrin but hasn't had any relief.

## 2014-08-11 NOTE — Discharge Instructions (Signed)
Dear [name]:    I appreciate your choosing the [facility] for your healthcare needs, and hope your visit today was EXCELLENT.    Instructions:  Please follow-up with your OBGYN as soon as possible.     Return to the Emergency Department for any worsening symptoms or concerns.    Below is some information that our patients often find helpful.    We wish you good health and please do not hesitate to contact us if we can ever be of any assistance.    Sincerely,  Forest Gleason, MD  [facility]    ________________________________________________________________    If you do not continue to improve or your condition worsens, please contact your doctor or return immediately to the Emergency Department.    Thank you for choosing [facility] for your emergency care needs.  We strive to provide EXCELLENT care to you and your family.      DOCTOR REFERRALS  Call [phone] if you need any further referrals and we can help you find a primary care doctor or specialist.  Also, available online at:  [email or web address]    YOUR CONTACT INFORMATION  Before leaving please check with registration to make sure we have an up-to-date contact number.  You can call registration at [phone] to update your information.  For questions about your hospital bill, please call [phone].  For questions about your Emergency Dept Physician bill please call [phone].      FREE HEALTH SERVICES  If you need help with health or social services, please call 2-1-1 for a free referral to resources in your area.  2-1-1 is a free service connecting people with information on health insurance, free clinics, pregnancy, mental health, dental care, food assistance, housing, and substance abuse counseling.  Also, available online at:  http://[email or web address].org    MEDICAL RECORDS AND TESTS  Certain laboratory test results do not come back the same day,  for example urine cultures.   We will contact you if other important findings are noted.  Radiology films are often reviewed again to ensure accuracy.  If there is any discrepancy, we will notify you.      Please call [phone] to pick up a complimentary CD of any radiology studies performed.  If you or your doctor would like to request a copy of your medical records, please call [phone].      ORTHOPEDIC INJURY   Please know that significant injuries can exist even when an initial x-ray is read as normal or negative.  This can occur because some fractures (broken bones) are not initially visible on x-rays.  For this reason, close outpatient follow-up with your primary care doctor or bone specialist (orthopedist) is required.    MEDICATIONS AND FOLLOWUP  Please be aware that some prescription medications can cause drowsiness.  Use caution when driving or operating machinery.    The examination and treatment you have received in our Emergency Department is provided on an emergency basis, and is not intended to be a substitute for your primary care physician.  It is important that your doctor checks you again and that you report any new or remaining problems at that time.      [facility] PHARMACIES  [facility] - [address], [place], [place] [address] (1.4 miles, 7 minutes)  [facility] - [address] [place], [place] [address] (6.5 miles, 13 minutes)  Handout with  directions available on request

## 2014-08-11 NOTE — ED Provider Notes (Signed)
Physician/Midlevel provider first contact with patient: 08/11/14 2010         History     Chief Complaint   Patient presents with   . Abdominal Pain   . Nausea   . Urinary Tract Infection Symptoms   . Tachycardia     Historian: Patient    28 y.o. female with h/o induced abortion p/w persistent diffuse lower abd pain s/p induced abortion 6 days ago. Associated with generalized fatigue, decreased appetite, and vaginal bleeding. Pt notes she was [redacted] weeks pregnant at the time of abortion.     PMD: Schutter, Elenore Paddy, NP (General)        Past Medical History   Diagnosis Date   . Crohn's disease    . H/O Hashimoto thyroiditis    . Anxiety    . Disorder of thyroid        Past Surgical History   Procedure Laterality Date   . Breast implant     . Induced abortion         No family history on file.    Social  History   Substance Use Topics   . Smoking status: Never Smoker    . Smokeless tobacco: Never Used   . Alcohol Use: Yes      Comment: occasionally       .     Allergies   Allergen Reactions   . Sulfa Antibiotics      rash   . Bactrim [Sulfamethoxazole W/Trimethoprim (Co-Trimoxazole)] Rash       Discharge Medication List as of 08/11/2014 11:13 PM      CONTINUE these medications which have NOT CHANGED    Details   ALPRAZolam (XANAX PO) Take by mouth., Until Discontinued, Historical Med      !! cefdinir (OMNICEF) 300 MG capsule Take 1 capsule (300 mg total) by mouth 2 (two) times daily., Starting 05/22/2014, Until Discontinued, Print      !! cefdinir (OMNICEF) 300 MG capsule Take 1 capsule (300 mg total) by mouth 2 (two) times daily., Starting 08/01/2014, Until Discontinued, Print      !! clonazePAM (KLONOPIN) 0.5 MG tablet Take 0.5 mg by mouth 2 (two) times daily as needed., Until Discontinued, Historical Med      !! clonazePAM (KLONOPIN) 0.5 MG tablet Take 1 tablet (0.5 mg total) by mouth 2 (two) times daily as needed for Anxiety., Starting 03/23/2014, Until Discontinued, Print      fish oil-omega-3 fatty acids 1000 MG  capsule Take 2 g by mouth daily., Until Discontinued, Historical Med      Ibuprofen (MOTRIN PO) Take by mouth., Until Discontinued, Historical Med      InFLIXimab (REMICADE IV) Inject into the vein., Until Discontinued, Historical Med      levothyroxine (SYNTHROID, LEVOTHROID) 88 MCG tablet Take 88 mcg by mouth daily., Until Discontinued, Historical Med      ondansetron (ZOFRAN ODT) 4 MG disintegrating tablet Take 1 tablet (4 mg total) by mouth every 8 (eight) hours as needed for Nausea., Starting 08/01/2014, Until Discontinued, Print      Zolpidem Tartrate (AMBIEN PO) Take 10 mg by mouth nightly as needed.   , Until Discontinued, Historical Med       !! - Potential duplicate medications found. Please discuss with provider.           Review of Systems   Constitutional: Positive for appetite change (decreased) and fatigue.   Gastrointestinal: Positive for abdominal pain.   Genitourinary: Positive for vaginal bleeding.  All other systems reviewed and are negative.      Physical Exam    BP: 117/65 mmHg, Heart Rate: 130, Temp: 97.7 F (36.5 C), Resp Rate: 16, SpO2: 99 %, Weight: 41.277 kg     Physical Exam   Constitutional: She is oriented to person, place, and time. She appears well-developed and well-nourished.   Patient is cooperative and not acutely ill-appearing though appears to some discomfort   HENT:   Head: Normocephalic and atraumatic.   Eyes: Conjunctivae and EOM are normal.   Neck: Normal range of motion. Neck supple.   Cardiovascular: Normal rate, regular rhythm, S1 normal and S2 normal.    Pulmonary/Chest: Effort normal and breath sounds normal.   Abdominal: Soft. She exhibits no distension. There is tenderness in the suprapubic area.   Maximal tenderness palpation in suprapubic with some secondary tenderness across her lower abdomen   Musculoskeletal: Normal range of motion.   Neurological: She is alert and oriented to person, place, and time. GCS eye subscore is 4. GCS verbal subscore is 5. GCS motor  subscore is 6.   Skin: Skin is warm and dry.   Nursing note and vitals reviewed.      MDM and ED Course     ED Medication Orders    Start     Status Ordering Provider    08/11/14 2128  HYDROmorphone (DILAUDID) injection 1 mg   Once     Route: Intravenous  Ordered Dose: 1 mg     Last MAR action:  Given Gredmarie Delange C    08/11/14 2128  ondansetron (ZOFRAN) injection 4 mg   Once     Route: Intravenous  Ordered Dose: 4 mg     Last MAR action:  Given Reyaansh Merlo C    08/11/14 2128  sodium chloride 0.9 % bolus 1,000 mL   Once     Route: Intravenous  Ordered Dose: 1,000 mL     Last MAR action:  Stopped Sayda Grable C    08/11/14 2113  sodium chloride 0.9 % bolus 1,000 mL   Once     Route: Intravenous  Ordered Dose: 1,000 mL     Last MAR action:  Stopped Dovber Ernest C         Orders Placed This Encounter   Procedures   . Urine culture   . US OB < 14 Weeks W Transvag   . CBC and differential   . Beta HCG, Quant, Serum   . UA, Reflex to Microscopic   . Comprehensive metabolic panel   . GFR   . ECG 12 Lead   . EKG SCAN   . Type and Screen   . Saline lock IV       MDM  Home Medications      Home medications reviewed by ED MD     Discharge Medication List as of 08/11/2014 11:13 PM      CONTINUE these medications which have NOT CHANGED    Details   ALPRAZolam (XANAX PO) Take by mouth., Until Discontinued, Historical Med      !! cefdinir (OMNICEF) 300 MG capsule Take 1 capsule (300 mg total) by mouth 2 (two) times daily., Starting 05/22/2014, Until Discontinued, Print      !! cefdinir (OMNICEF) 300 MG capsule Take 1 capsule (300 mg total) by mouth 2 (two) times daily., Starting 08/01/2014, Until Discontinued, Print      !! clonazePAM (KLONOPIN) 0.5 MG tablet Take 0.5 mg by mouth 2 (two) times  daily as needed., Until Discontinued, Historical Med      !! clonazePAM (KLONOPIN) 0.5 MG tablet Take 1 tablet (0.5 mg total) by mouth 2 (two) times daily as needed for Anxiety., Starting 03/23/2014, Until Discontinued, Print      fish  oil-omega-3 fatty acids 1000 MG capsule Take 2 g by mouth daily., Until Discontinued, Historical Med      Ibuprofen (MOTRIN PO) Take by mouth., Until Discontinued, Historical Med      InFLIXimab (REMICADE IV) Inject into the vein., Until Discontinued, Historical Med      levothyroxine (SYNTHROID, LEVOTHROID) 88 MCG tablet Take 88 mcg by mouth daily., Until Discontinued, Historical Med      ondansetron (ZOFRAN ODT) 4 MG disintegrating tablet Take 1 tablet (4 mg total) by mouth every 8 (eight) hours as needed for Nausea., Starting 08/01/2014, Until Discontinued, Print      Zolpidem Tartrate (AMBIEN PO) Take 10 mg by mouth nightly as needed.   , Until Discontinued, Historical Med       !! - Potential duplicate medications found. Please discuss with provider.          Nurses notes including past history, allergies, vital signs reviewed.  Past Medical History   Diagnosis Date   . Crohn's disease    . H/O Hashimoto thyroiditis    . Anxiety    . Disorder of thyroid      Past Surgical History   Procedure Laterality Date   . Breast implant     . Induced abortion       Allergies   Allergen Reactions   . Sulfa Antibiotics      rash   . Bactrim [Sulfamethoxazole W/Trimethoprim (Co-Trimoxazole)] Rash       BP 106/70 mmHg  Pulse 70  Temp(Src) 98 F (36.7 C)  Resp 16  Ht 1.549 m  Wt 41.277 kg  BMI 17.20 kg/m2  SpO2 100%  LMP 06/16/2014  Breastfeeding? Unknown    O2 Sat in ED is 99% which is within normal limits.  Pt is being observed on continuous pulsox.    Patient presents with persistent vaginal bleeding as well as lower abdominal pain status post medical abortion performed on August 8th. She is also tachycardic. Situation is concerning for significant hemorrhage and/or retained products of conception. Plan to check EKG, labs, urine, and ultrasound of the pelvis. Support with IV fluids, antiemetic, and analgesia.    Patient [name] was seen and treated by me in the ED.   Forest Gleason, MD  8:11 PM    Scribe  and MD Attestations     I, Donia Guiles, am serving as a scribe to document services personally performed by Forest Gleason, MD, based on the provider's statements to me.    IForest Gleason, MD, personally performed the services documented. Donia Guiles is scribing for me on Fuchs,Merary M. I reviewed and confirm the accuracy of the information in this medical record.     Rendering Provider: Forest Gleason, MD    EKG, Monitoring, and Splints     EKG (interpreted by ED physician): EKG shows normal sinus rhythm @ 95 bpm.  Normal intervals and conduction.  No ST-T abnormalities.  Normal axis.  Overall impression is normal EKG.  Cardiac Monitor (interpreted by ED physician):  N/A    Splint Check:     Diagnostic Study Results     The results of the diagnostic studies below were reviewed by the ED provider:  Results    Procedure Component Value Units Date/Time    UA, Reflex to Microscopic [[id number]]  (Abnormal) Collected:  08/11/14 2245    Specimen Information:  Urine Updated:  08/11/14 2309     Urine Type Clean Catch      Color, UA Straw      Clarity, UA Clear      Specific Gravity UA 1.011      Urine pH 7.0      Leukocyte Esterase, UA Negative      Nitrite, UA Negative      Protein, UR Negative      Glucose, UA Negative      Ketones UA Negative      Urobilinogen, UA Normal mg/dL      Bilirubin, UA Negative      Blood, UA Small (A)      RBC, UA 0 - 5 /hpf      WBC, UA 0 - 5 /hpf      Squamous Epithelial Cells, Urine 0 - 5 /hpf      Urine Bacteria Rare /hpf     Comprehensive metabolic panel [[id number]]  (Abnormal) Collected:  08/11/14 2010     Glucose 81 mg/dL Updated:  78/29/56 2130     BUN 10 mg/dL      Creatinine 0.8 mg/dL      Sodium 865 mEq/L      Potassium 4.0 mEq/L      Chloride 105 mEq/L      CO2 22 mEq/L      CALCIUM 10.1 mg/dL      Protein, Total 7.9 g/dL      Albumin 4.1 g/dL      AST (SGOT) 33 U/L      ALT 40 U/L      Alkaline Phosphatase 69 U/L      Bilirubin, Total 0.5 mg/dL      Globulin 3.8  (H) g/dL      Albumin/Globulin Ratio 1.1      Anion Gap 11.0     GFR [[id number]] Collected:  08/11/14 2010     EGFR >60.0 Updated:  08/11/14 2136    Type and Screen [[id number]] Collected:  08/11/14 2010    Specimen Information:  Blood Updated:  08/11/14 2106     ABO Rh O POS      AB Screen Gel NEG     Beta HCG, Quant, Serum [[id number]] Collected:  08/11/14 2010     hCG, Quant. 1709.4 Updated:  08/11/14 2043    CBC and differential [[id number]]  (Abnormal) Collected:  08/11/14 2010    Specimen Information:  Blood / Blood Updated:  08/11/14 2026     WBC 11.83 (H) x10 3/uL      RBC 4.01 (L) x10 6/uL      Hgb 12.9 g/dL      Hematocrit 95.6 %      MCV 94.8 fL      MCH 32.2 (H) pg      MCHC 33.9 g/dL      RDW 13 %      Platelets 440 (H) x10 3/uL      MPV 9.3 (L) fL      Neutrophils 75 %      Lymphocytes Automated 17 %      Monocytes 7 %      Eosinophils Automated 0 %      Basophils Automated 0 %      Immature Granulocyte 0 %      Nucleated RBC 0 /  100 WBC      Neutrophils Absolute 8.87 (H) x10 3/uL      Abs Lymph Automated 2.05 x10 3/uL      Abs Mono Automated 0.84 x10 3/uL      Abs Eos Automated 0.04 x10 3/uL      Absolute Baso Automated 0.03 x10 3/uL      Absolute Immature Granulocyte 0.03 x10 3/uL           Radiology Results (24 Hour)    Procedure Component Value Units Date/Time    US OB < 14 Margaretha Sheffield Transvag [[id number]] Collected:  08/11/14 2114    Order Status:  Completed Updated:  08/11/14 2119    Narrative:      History: Status post abortion with pain    COMPARISON: 08/01/2014    Transabdominal and endovaginal ultrasound show no intrauterine  pregnancy. There is some debris seen within the endometrial cavity.  Endometrium measures 1.4 cm in thickness. Both ovaries are visualized  and appears unremarkable. No free fluid is seen in the pelvis.      Impression:       Some endometrial debris. No intrauterine pregnancy is seen.    Kristine Linea, MD   08/11/2014 9:15 PM            Clinical Notes      11:11PM D/w patient  that results are reassuring. F/u with obgyn to track her Hcg down to 0.     Critical Care     Total critical care time (excluding procedures):     Procedures    Clinical Impression & Disposition     Clinical Impression  Final diagnoses:   Abdominal pain, unspecified abdominal location   Vaginal bleeding        ED Disposition    Discharge Sherre Lain discharge to home/self care.    Condition at disposition: Stable             Discharge Medication List as of 08/11/2014 11:13 PM                      Forest Gleason, MD  08/12/14 1318

## 2014-08-12 LAB — ECG 12-LEAD
Atrial Rate: 98 {beats}/min
P Axis: 63 degrees
P-R Interval: 96 ms
Q-T Interval: 314 ms
QRS Duration: 70 ms
QTC Calculation (Bezet): 400 ms
R Axis: 79 degrees
T Axis: 66 degrees
Ventricular Rate: 98 {beats}/min

## 2014-08-24 ENCOUNTER — Emergency Department: Payer: 59

## 2014-08-24 ENCOUNTER — Emergency Department: Payer: 59 | Admitting: Certified Registered"

## 2014-08-24 ENCOUNTER — Ambulatory Visit
Admission: EM | Admit: 2014-08-24 | Discharge: 2014-08-24 | Disposition: A | Discharge status: Home / Self Care | Payer: 59 | Attending: Obstetrics & Gynecology | Admitting: Obstetrics & Gynecology

## 2014-08-24 ENCOUNTER — Ambulatory Visit: Payer: Self-pay

## 2014-08-24 ENCOUNTER — Emergency Department: Payer: 59 | Admitting: Obstetrics & Gynecology

## 2014-08-24 ENCOUNTER — Encounter
Admission: EM | Disposition: A | Discharge status: Home / Self Care | Payer: Self-pay | Source: Home / Self Care | Attending: Emergency Medicine

## 2014-08-24 DIAGNOSIS — O039 Complete or unspecified spontaneous abortion without complication: Secondary | ICD-10-CM

## 2014-08-24 DIAGNOSIS — Z8639 Personal history of other endocrine, nutritional and metabolic disease: Secondary | ICD-10-CM

## 2014-08-24 DIAGNOSIS — K5090 Crohn's disease, unspecified, without complications: Secondary | ICD-10-CM | POA: Insufficient documentation

## 2014-08-24 DIAGNOSIS — E039 Hypothyroidism, unspecified: Secondary | ICD-10-CM | POA: Insufficient documentation

## 2014-08-24 DIAGNOSIS — O071 Delayed or excessive hemorrhage following failed attempted termination of pregnancy: Secondary | ICD-10-CM | POA: Insufficient documentation

## 2014-08-24 DIAGNOSIS — F411 Generalized anxiety disorder: Secondary | ICD-10-CM | POA: Insufficient documentation

## 2014-08-24 DIAGNOSIS — R1030 Lower abdominal pain, unspecified: Secondary | ICD-10-CM

## 2014-08-24 DIAGNOSIS — E063 Autoimmune thyroiditis: Secondary | ICD-10-CM | POA: Insufficient documentation

## 2014-08-24 HISTORY — DX: Complete or unspecified spontaneous abortion without complication: O03.9

## 2014-08-24 HISTORY — PX: D & C, SUCTION: SHX3666

## 2014-08-24 LAB — URINALYSIS, REFLEX TO MICROSCOPIC EXAM IF INDICATED
Bilirubin, UA: NEGATIVE
Blood, UA: NEGATIVE
Glucose, UA: NEGATIVE
Ketones UA: NEGATIVE
Leukocyte Esterase, UA: NEGATIVE
Nitrite, UA: NEGATIVE
Protein, UR: NEGATIVE
Specific Gravity UA: 1.003 (ref 1.001–1.035)
Urine pH: 6 (ref 5.0–8.0)
Urobilinogen, UA: NORMAL mg/dL

## 2014-08-24 LAB — CBC AND DIFFERENTIAL
Basophils Absolute Automated: 0.03 10*3/uL (ref 0.00–0.20)
Basophils Automated: 0 %
Eosinophils Absolute Automated: 0.09 10*3/uL (ref 0.00–0.70)
Eosinophils Automated: 1 %
Hematocrit: 40.6 % (ref 37.0–47.0)
Hgb: 13.6 g/dL (ref 12.0–16.0)
Immature Granulocytes Absolute: 0.01 10*3/uL
Immature Granulocytes: 0 %
Lymphocytes Absolute Automated: 2.37 10*3/uL (ref 0.50–4.40)
Lymphocytes Automated: 33 %
MCH: 31.9 pg (ref 28.0–32.0)
MCHC: 33.5 g/dL (ref 32.0–36.0)
MCV: 95.1 fL (ref 80.0–100.0)
MPV: 9.6 fL (ref 9.4–12.3)
Monocytes Absolute Automated: 0.97 10*3/uL (ref 0.00–1.20)
Monocytes: 13 %
Neutrophils Absolute: 3.77 10*3/uL (ref 1.80–8.10)
Neutrophils: 52 %
Nucleated RBC: 0 /100 WBC (ref 0–1)
Platelets: 478 10*3/uL — ABNORMAL HIGH (ref 140–400)
RBC: 4.27 10*6/uL (ref 4.20–5.40)
RDW: 13 % (ref 12–15)
WBC: 7.23 10*3/uL (ref 3.50–10.80)

## 2014-08-24 LAB — GFR: EGFR: >60

## 2014-08-24 LAB — HEPATIC FUNCTION PANEL
ALT: 60 U/L — ABNORMAL HIGH (ref 0–55)
AST (SGOT): 46 U/L — ABNORMAL HIGH (ref 5–34)
Albumin/Globulin Ratio: 1.2 (ref 0.9–2.2)
Albumin: 4.2 g/dL (ref 3.5–5.0)
Alkaline Phosphatase: 78 U/L (ref 37–106)
Bilirubin Direct: 0.3 mg/dL (ref 0.0–0.5)
Bilirubin Indirect: 0.3 mg/dL (ref 0.0–1.1)
Bilirubin, Total: 0.6 mg/dL (ref 0.2–1.2)
Globulin: 3.4 g/dL (ref 2.0–3.6)
Protein, Total: 7.6 g/dL (ref 6.0–8.3)

## 2014-08-24 LAB — LIPASE: Lipase: 9 U/L (ref 8–78)

## 2014-08-24 LAB — BASIC METABOLIC PANEL
Anion Gap: 13 (ref 5.0–15.0)
BUN: 7 mg/dL (ref 7–19)
CO2: 21 mEq/L — ABNORMAL LOW (ref 22–29)
Calcium: 10 mg/dL (ref 8.5–10.5)
Chloride: 105 mEq/L (ref 100–111)
Creatinine: 0.8 mg/dL (ref 0.6–1.0)
Glucose: 93 mg/dL (ref 70–100)
Potassium: 3.5 mEq/L (ref 3.5–5.1)
Sodium: 139 mEq/L (ref 136–145)

## 2014-08-24 LAB — HCG, SERUM, QUALITATIVE: Hcg Qualitative: POSITIVE — AB

## 2014-08-24 SURGERY — DILATION AND CURETTAGE (D&C), SUCTION
Anesthesia: Anesthesia General | Site: Pelvis | Wound class: Clean Contaminated

## 2014-08-24 MED ORDER — CEFAZOLIN SODIUM-DEXTROSE 2-3 GM-% IV SOLR
2.0000 g | Freq: Once | INTRAVENOUS | Status: AC
Start: 2014-08-24 — End: 2014-08-24
  Administered 2014-08-24: 2 g via INTRAVENOUS
  Filled 2014-08-24: qty 50

## 2014-08-24 MED ORDER — MIDAZOLAM HCL 5 MG/5ML IJ SOLN
INTRAMUSCULAR | Status: DC | PRN
Start: 2014-08-24 — End: 2014-08-24
  Administered 2014-08-24: 2 mg via INTRAVENOUS

## 2014-08-24 MED ORDER — FENTANYL CITRATE 0.05 MG/ML IJ SOLN
25.0000 ug | INTRAMUSCULAR | Status: AC | PRN
Start: 2014-08-24 — End: 2014-08-24
  Administered 2014-08-24 (×3): 25 ug via INTRAVENOUS

## 2014-08-24 MED ORDER — MORPHINE SULFATE 2 MG/ML IJ/IV SOLN (WRAP)
1.0000 mg | Freq: Once | Status: AC
Start: 2014-08-24 — End: 2014-08-24
  Administered 2014-08-24: 1 mg via INTRAVENOUS
  Filled 2014-08-24: qty 1

## 2014-08-24 MED ORDER — IOHEXOL 300 MG/ML IJ SOLN
30.0000 mL | Freq: Once | INTRAMUSCULAR | Status: AC
Start: 2014-08-24 — End: 2014-08-24
  Administered 2014-08-24: 30 mL via ORAL

## 2014-08-24 MED ORDER — IOHEXOL 350 MG/ML IV SOLN
80.0000 mL | Freq: Once | INTRAVENOUS | Status: AC | PRN
Start: 2014-08-24 — End: 2014-08-24
  Administered 2014-08-24: 80 mL via INTRAVENOUS

## 2014-08-24 MED ORDER — OXYCODONE-ACETAMINOPHEN 5-325 MG PO TABS
1.0000 | ORAL_TABLET | Freq: Once | ORAL | Status: DC | PRN
Start: 2014-08-24 — End: 2014-08-24

## 2014-08-24 MED ORDER — OXYTOCIN 10 UNIT/ML IJ SOLN
INTRAMUSCULAR | Status: AC
Start: 2014-08-24 — End: ?
  Filled 2014-08-24: qty 1

## 2014-08-24 MED ORDER — LIDOCAINE HCL 2 % IJ SOLN
INTRAMUSCULAR | Status: AC
Start: 2014-08-24 — End: ?
  Filled 2014-08-24: qty 20

## 2014-08-24 MED ORDER — PROPOFOL 10 MG/ML IV EMUL
INTRAVENOUS | Status: DC | PRN
Start: 2014-08-24 — End: 2014-08-24
  Administered 2014-08-24: 10 mg via INTRAVENOUS
  Administered 2014-08-24: 30 mg via INTRAVENOUS
  Administered 2014-08-24: 40 mg via INTRAVENOUS

## 2014-08-24 MED ORDER — FENTANYL CITRATE 0.05 MG/ML IJ SOLN
INTRAMUSCULAR | Status: DC | PRN
Start: 2014-08-24 — End: 2014-08-24
  Administered 2014-08-24 (×4): 25 ug via INTRAVENOUS

## 2014-08-24 MED ORDER — ONDANSETRON HCL 4 MG/2ML IJ SOLN
INTRAMUSCULAR | Status: AC
Start: 2014-08-24 — End: ?
  Filled 2014-08-24: qty 2

## 2014-08-24 MED ORDER — PROPOFOL 10 MG/ML IV EMUL
INTRAVENOUS | Status: AC
Start: 2014-08-24 — End: ?
  Filled 2014-08-24: qty 20

## 2014-08-24 MED ORDER — LIDOCAINE HCL 2 % IJ SOLN
INTRAMUSCULAR | Status: DC | PRN
Start: 2014-08-24 — End: 2014-08-24
  Administered 2014-08-24: 40 mg

## 2014-08-24 MED ORDER — LACTATED RINGERS IV SOLN
INTRAVENOUS | Status: DC | PRN
Start: 2014-08-24 — End: 2014-08-24

## 2014-08-24 MED ORDER — SODIUM CHLORIDE 0.9 % IR SOLN
Status: DC | PRN
Start: 2014-08-24 — End: 2014-08-24
  Administered 2014-08-24: 1000 mL

## 2014-08-24 MED ORDER — HYDROMORPHONE HCL PF 1 MG/ML IJ SOLN
INTRAMUSCULAR | Status: AC
Start: 2014-08-24 — End: 2014-08-24
  Administered 2014-08-24: 0.5 mg via INTRAVENOUS
  Filled 2014-08-24: qty 1

## 2014-08-24 MED ORDER — OXYTOCIN 10 UNIT/ML IJ SOLN
INTRAMUSCULAR | Status: DC | PRN
Start: 2014-08-24 — End: 2014-08-24
  Administered 2014-08-24: 10 [IU] via INTRAVENOUS

## 2014-08-24 MED ORDER — ONDANSETRON HCL 4 MG/2ML IJ SOLN
4.0000 mg | Freq: Once | INTRAMUSCULAR | Status: AC
Start: 2014-08-24 — End: 2014-08-24
  Administered 2014-08-24: 4 mg via INTRAVENOUS
  Filled 2014-08-24: qty 2

## 2014-08-24 MED ORDER — DIPHENHYDRAMINE HCL 50 MG/ML IJ SOLN
12.5000 mg | Freq: Four times a day (QID) | INTRAMUSCULAR | Status: DC | PRN
Start: 2014-08-24 — End: 2014-08-24

## 2014-08-24 MED ORDER — MONSELS FERRIC SUBSULFATE EX SOLN
CUTANEOUS | Status: DC | PRN
Start: 2014-08-24 — End: 2014-08-24
  Administered 2014-08-24: 8 mL via TOPICAL

## 2014-08-24 MED ORDER — KETOROLAC TROMETHAMINE 30 MG/ML IJ SOLN
INTRAMUSCULAR | Status: AC
Start: 2014-08-24 — End: ?
  Filled 2014-08-24: qty 1

## 2014-08-24 MED ORDER — HYDROMORPHONE HCL PF 1 MG/ML IJ SOLN
1.0000 mg | Freq: Once | INTRAMUSCULAR | Status: AC
Start: 2014-08-24 — End: 2014-08-24
  Administered 2014-08-24: 1 mg via INTRAVENOUS
  Filled 2014-08-24: qty 1

## 2014-08-24 MED ORDER — FERRIC SUBSULFATE 259 MG/GM EX SOLN
CUTANEOUS | Status: AC
Start: 2014-08-24 — End: ?
  Filled 2014-08-24: qty 8000

## 2014-08-24 MED ORDER — FENTANYL CITRATE 0.05 MG/ML IJ SOLN
INTRAMUSCULAR | Status: AC
Start: 2014-08-24 — End: 2014-08-24
  Administered 2014-08-24: 25 ug via INTRAVENOUS
  Filled 2014-08-24: qty 2

## 2014-08-24 MED ORDER — KETOROLAC TROMETHAMINE 30 MG/ML IJ SOLN
INTRAMUSCULAR | Status: DC | PRN
Start: 2014-08-24 — End: 2014-08-24
  Administered 2014-08-24: 30 mg via INTRAVENOUS

## 2014-08-24 MED ORDER — PROPOFOL INFUSION 10 MG/ML
INTRAVENOUS | Status: DC | PRN
Start: 2014-08-24 — End: 2014-08-24
  Administered 2014-08-24: 140 ug/kg/min via INTRAVENOUS

## 2014-08-24 MED ORDER — DEXAMETHASONE SODIUM PHOSPHATE 4 MG/ML IJ SOLN
INTRAMUSCULAR | Status: AC
Start: 2014-08-24 — End: ?
  Filled 2014-08-24: qty 1

## 2014-08-24 MED ORDER — ONDANSETRON HCL 4 MG/2ML IJ SOLN
4.0000 mg | Freq: Once | INTRAMUSCULAR | Status: DC | PRN
Start: 2014-08-24 — End: 2014-08-24

## 2014-08-24 MED ORDER — HYDROMORPHONE HCL PF 1 MG/ML IJ SOLN
0.5000 mg | INTRAMUSCULAR | Status: DC | PRN
Start: 2014-08-24 — End: 2014-08-24
  Administered 2014-08-24: 0.5 mg via INTRAVENOUS

## 2014-08-24 MED ORDER — MIDAZOLAM HCL 2 MG/2ML IJ SOLN
INTRAMUSCULAR | Status: AC
Start: 2014-08-24 — End: ?
  Filled 2014-08-24: qty 2

## 2014-08-24 MED ORDER — ONDANSETRON HCL 4 MG/2ML IJ SOLN
INTRAMUSCULAR | Status: DC | PRN
Start: 2014-08-24 — End: 2014-08-24
  Administered 2014-08-24: 4 mg via INTRAVENOUS

## 2014-08-24 MED ORDER — DEXAMETHASONE SODIUM PHOSPHATE 4 MG/ML IJ SOLN (WRAP)
INTRAMUSCULAR | Status: DC | PRN
Start: 2014-08-24 — End: 2014-08-24
  Administered 2014-08-24: 4 mg via INTRAVENOUS

## 2014-08-24 MED ORDER — SODIUM CHLORIDE 0.9 % IV BOLUS
1000.0000 mL | Freq: Once | INTRAVENOUS | Status: AC
Start: 2014-08-24 — End: 2014-08-24
  Administered 2014-08-24: 1000 mL via INTRAVENOUS

## 2014-08-24 MED ORDER — MEPERIDINE HCL 25 MG/ML IJ SOLN
12.5000 mg | INTRAMUSCULAR | Status: DC | PRN
Start: 2014-08-24 — End: 2014-08-24

## 2014-08-24 MED ORDER — FENTANYL CITRATE 0.05 MG/ML IJ SOLN
INTRAMUSCULAR | Status: AC
Start: 2014-08-24 — End: ?
  Filled 2014-08-24: qty 2

## 2014-08-24 MED ORDER — PROMETHAZINE HCL 25 MG/ML IJ SOLN
6.2500 mg | Freq: Once | INTRAMUSCULAR | Status: DC | PRN
Start: 2014-08-24 — End: 2014-08-24

## 2014-08-24 MED ORDER — PROMETHAZINE HCL 25 MG/ML IJ SOLN
12.5000 mg | Freq: Once | INTRAMUSCULAR | Status: AC
Start: 2014-08-24 — End: 2014-08-24
  Administered 2014-08-24: 12.5 mg via INTRAVENOUS
  Filled 2014-08-24: qty 1

## 2014-08-24 SURGICAL SUPPLY — 22 items
CANNULA VACURETTE F-TIP 6MM (Suction) ×1
CANNULA VACURETTE F-TIP 7MM (Suction) ×1
CANNULA VACUUM ASPIRATION OD8 MM (Suction) ×1
CANNULA VACUUM ASPIRATION OD8 MM VACURETTE F TIP (Suction) ×1 IMPLANT
CURETTE VAC PLS 8MM LF STRL CRV OPN TIP (Procedure Accessories)
CURETTE VACUUM OD6 MM PLASTIC SEMIRIGID (Suction) ×1
CURETTE VACUUM OD6 MM PLASTIC SEMIRIGID ROUND TIP UTERINE VACURETTE (Suction) ×1 IMPLANT
CURETTE VACUUM OD8 MM PLASTIC CURVE OPEN (Procedure Accessories)
CURETTE VACUUM OD8 MM PLASTIC CURVE OPEN TIP MOLD MARK FROST SOUND (Procedure Accessories) IMPLANT
GLOVE SURG BIOGEL SKNSNS SZ5.5 (Glove) ×2 IMPLANT
SET L6 FT SWIVEL HANDLE SLIP RING MALE (Tubing) ×1
SET L6 FT SWIVEL HANDLE SLIP RING MALE FIT BERKELEY SAFETOUCH TUBING (Tubing) ×1 IMPLANT
SET TUBING BRKLY SAFETOUCH 3/8IN 6FT (Tubing) ×1
SOLUTION IRR 0.9% NACL 1000ML LF STRL (Irrigation Solutions) ×1
SOLUTION IRRIGATION 0.9% SODIUM CHLORIDE (Irrigation Solutions) ×1
SOLUTION IRRIGATION 0.9% SODIUM CHLORIDE 1000 ML PLASTIC POUR BOTTLE (Irrigation Solutions) ×1 IMPLANT
TIP SCT CRV 9MM LF STRL OPN TIP MLD MRK (Instrument)
TIP SUCTION CURVE OD9 MM OPEN TIP MOLD (Instrument)
TIP SUCTION CURVE OD9 MM OPEN TIP MOLD MARK FROST SOUND LINE CLEAR (Instrument) IMPLANT
TRAY D (Pack) ×2 IMPLANT
TRAY SKIN DRY SCRUB (Tray) ×2 IMPLANT
TUBING SCT PLS 3/8IN 6FT LF STRL SWVL (Tubing) ×2 IMPLANT

## 2014-08-24 NOTE — ED Notes (Signed)
Pt medicated for pain with effect pending. IVF's infusing with ABX's. Careplan relayed by Dr. Neita Carp.

## 2014-08-24 NOTE — ED Notes (Signed)
IVF's infusing. Pt drinking po contrast. Pain remains 6/10. No relief with Morphine. Pt requesting add'l pain medication. Rn to inform ERP.

## 2014-08-24 NOTE — Discharge Instructions (Signed)
Discharge Instructions for Dilation and Curettage (D and C)  Your doctor performed dilation and curettage (D&C). The reasons for having this procedure vary from person to person. The D&C may be performed to control heavy uterine bleeding, to find the cause of irregular bleeding, to perform an abortion, or to remove pregnancy tissue if you have had a miscarriage.  Home Care   Take it easy. Stay quiet and rest for 2 days.   Return to your normal activities after 48 hours. You may also return to work at that time.   Eat a normal diet.   Take an over-the-counter pain reliever for pain, if needed.   Remember, it's OK to have bleeding for about a week after the procedure. The amount of bleeding should be similar to what you have during a normal period.   Don't lift anything heavier than 10 pounds for 1 week after the procedure.   Don't drive for 24 hours after the procedure.   Don't have sexual intercourse or use tampons or douches until your doctor says it's safe to do so. This can be anywhere from 2 to 6 weeks.  Follow-Up   Make a follow-up appointment as directed by our staff.    When to Call Your Doctor  Call your doctor immediately if you have any of the following:   Bleeding that soaks more than one sanitary pad in one hour   Severe abdominal pain   Severe cramps   Fever above 100.4F (38.0C)   Chills   A foul smelling vaginal discharge    2000-2014 [employer] [address], [place] [address]. All rights reserved. This information is not intended as a substitute for professional medical care. Always follow your healthcare professional's instructions.      Post Anesthesia Discharge Instructions    Although you may be awake and alert in the recovery room, small amounts of anesthetic remain in your system for about 24 hours.  You may feel tired and sleepy during this time.      You are advised to go directly home from the hospital.    Plan to stay at home and rest for the  remainder of the day.    It is advisable to have someone with you at home for 24 hours after surgery.    Do not operate a motor vehicle, or any mechanical or electrical equipment for the next 24 hours.      Be careful when you are walking around, you may become dizzy.  The effects of anesthesia and/or medications are still present and drowsiness may occur    Do not consume alcohol, tranquilizers, sleeping medications, or any other non prescribed medication for the remainder of the day.    Diet:  begin with liquids, progress your diet as tolerated or as directed by your surgeon.  Nausea and vomiting may occur in the next 24 hours.

## 2014-08-24 NOTE — Anesthesia Preprocedure Evaluation (Addendum)
Anesthesia Evaluation    AIRWAY    Mallampati: I    TM distance: >3 FB  Neck ROM: full  Mouth Opening:full   CARDIOVASCULAR    regular and normal       DENTAL    no notable dental hx     PULMONARY    clear to auscultation     OTHER FINDINGS                      Anesthesia Plan    ASA 1     general               (Discussed health history and anesthesia plan. )      intravenous induction   Detailed anesthesia plan: general IV        Post op pain management: per surgeon    informed consent obtained      pertinent labs reviewed

## 2014-08-24 NOTE — ED Notes (Signed)
Dr. Franke at bedside for eval.

## 2014-08-24 NOTE — ED Provider Notes (Signed)
Physician/Midlevel provider first contact with patient: 08/24/14 1403         History     Chief Complaint   Patient presents with   . Diarrhea   . Abdominal Cramping     HPI   Patient is a 28 year old female with a history of Crohn's disease.  She is also 2 weeks status post therapeutic abortion.  Today complaining two-week history of diarrhea and lower abdominal pain.  Fever to 101 last night.  No blood in stool.  + dysuria.  Diminished appetite.  No vomiting.  Positive dysuria.  Past Medical History   Diagnosis Date   . Crohn's disease    . H/O Hashimoto thyroiditis    . Anxiety    . Disorder of thyroid    . Abortion        Past Surgical History   Procedure Laterality Date   . Breast implant     . Induced abortion         History reviewed. No pertinent family history.    Social  History   Substance Use Topics   . Smoking status: Never Smoker    . Smokeless tobacco: Never Used   . Alcohol Use: Yes      Comment: occasionally       .     Allergies   Allergen Reactions   . Sulfa Antibiotics      rash   . Bactrim [Sulfamethoxazole W/Trimethoprim (Co-Trimoxazole)] Rash       Current/Home Medications    ALPRAZOLAM (XANAX PO)    Take by mouth.    CLONAZEPAM (KLONOPIN) 0.5 MG TABLET    Take 0.5 mg by mouth 2 (two) times daily as needed.    CLONAZEPAM (KLONOPIN) 0.5 MG TABLET    Take 1 tablet (0.5 mg total) by mouth 2 (two) times daily as needed for Anxiety.    FISH OIL-OMEGA-3 FATTY ACIDS 1000 MG CAPSULE    Take 2 g by mouth daily.    IBUPROFEN (MOTRIN PO)    Take by mouth.    LEVOTHYROXINE (SYNTHROID, LEVOTHROID) 88 MCG TABLET    Take 88 mcg by mouth daily.    ZOLPIDEM TARTRATE (AMBIEN PO)    Take 10 mg by mouth nightly as needed.           Review of Systems  All other systems reviewed and negative.  Physical Exam    BP: 122/76 mmHg, Heart Rate: 98, Temp: 98.1 F (36.7 C), Resp Rate: 16, SpO2: 99 %, Weight: (!) 40.824 kg    Physical Exam  CONSTITUTIONAL Patient is afebrile, Vital signs reviewed, Patient appears  comfortable, Alert and oriented X 3.  HEAD Atraumatic, Normocephalic.  EYES Eyes are normal to inspection, PERRL, No discharge from eyes,  Extraocular muscles intact, Sclera are normal, Conjunctiva are normal.  ENT Posterior pharynx normal, Mouth normal to inspection.  NECK Normal ROM, No meningeal signs, Cervical spine nontender.  RESPIRATORY CHEST Breath sounds normal, No respiratory distress.  CARDIOVASCULAR   RRR, No murmurs, Normal S1 S2, No rub.  ABDOMEN Abdomen is tender,  primarily suprapubic No distension.  UPPER EXTREMITY Inspection normal, Normal range of motion.   LOWER EXTREMITY Inspection normal, Normal range of motion.   NEURO GCS is 15  SKIN Skin is warm. Skin is dry.   PSYCHIATRIC Oriented X 3, Normal affect.    MDM and ED Course     ED Medication Orders     Start     Status Ordering Provider  08/24/14 1640  promethazine (PHENERGAN) injection 12.5 mg   Once     Route: Intravenous  Ordered Dose: 12.5 mg     Last MAR action:  Given Neita Carp, Thao Vanover E    08/24/14 1530  HYDROmorphone (DILAUDID) injection 1 mg   Once     Route: Intravenous  Ordered Dose: 1 mg     Last MAR action:  Given Neita Carp, Joeanne Robicheaux E    08/24/14 1530  ondansetron (ZOFRAN) injection 4 mg   Once     Route: Intravenous  Ordered Dose: 4 mg     Last MAR action:  Given Neita Carp, Ryland Smoots E    08/24/14 1515  ceFAZolin (ANCEF) 2 g in dextrose 5% water 50 mL (duplex) premix   Once     Route: Intravenous  Ordered Dose: 2 g     Last MAR action:  Stopped Amaryah Mallen E    08/24/14 1415  iohexol (OMNIPAQUE) 300 MG/ML injection 30 mL   Once     Route: Oral  Ordered Dose: 30 mL     Last MAR action:  Given Rolan Wrightsman E    08/24/14 1415  ondansetron (ZOFRAN) injection 4 mg   Once     Route: Intravenous  Ordered Dose: 4 mg     Last MAR action:  Given Neita Carp, Samie Barclift E    08/24/14 1415  sodium chloride 0.9 % bolus 1,000 mL   Once     Route: Intravenous  Ordered Dose: 1,000 mL     Last MAR action:  Stopped Kaydn Kumpf E    08/24/14 1414  morphine injection 1 mg    Once     Route: Intravenous  Ordered Dose: 1 mg     Last MAR action:  Given Kensy Blizard E    08/24/14 1359  sodium chloride 0.9 % bolus 1,000 mL   Once     Route: Intravenous  Ordered Dose: 1,000 mL     Last MAR action:  Stopped Neita Carp, Cheney Gosch E           MDM  discussed with Dr. Welton Flakes who will see patient in the OR.  Findings consistent with retained products.  CT scan prior to OR      Procedures    Clinical Impression & Disposition     Clinical Impression  Final diagnoses:   Lower abdominal pain        ED Disposition     Send to [facility] OR            New Prescriptions    No medications on file                 Dorethea Clan, MD  08/24/14 1721

## 2014-08-24 NOTE — H&P (Signed)
GYN ADMISSION HISTORY AND PHYSICAL EXAM    Date Time: 08/24/2014 6:47 PM  Patient Name: [name]  Attending Physician: No att. providers found    CC: pain and VB    History of Presenting Illness:   [name] is a 28 y.o. female who presents to the hospital with pain and bleeding    Past Medical History:     Past Medical History   Diagnosis Date   . Crohn's disease    . H/O Hashimoto thyroiditis      hypothyroidism   . Anxiety    . Abortion        Past Surgical History:     Past Surgical History   Procedure Laterality Date   . Breast implant     . Induced abortion         OB History:     LMP:  G: 2 P:0 Sab:  Tab: BC/HRT:       Medications / Herbals / OTC:     Prescriptions prior to admission   Medication Sig   . ALPRAZolam (XANAX PO) Take by mouth.   . clonazePAM (KLONOPIN) 0.5 MG tablet Take 0.5 mg by mouth 2 (two) times daily as needed.   . clonazePAM (KLONOPIN) 0.5 MG tablet Take 1 tablet (0.5 mg total) by mouth 2 (two) times daily as needed for Anxiety. (Patient taking differently: Take 1 mg by mouth 2 (two) times daily as needed for Anxiety.   )   . fish oil-omega-3 fatty acids 1000 MG capsule Take 2 g by mouth daily.   . Ibuprofen (MOTRIN PO) Take by mouth.   . levothyroxine (SYNTHROID, LEVOTHROID) 88 MCG tablet Take 88 mcg by mouth daily.   . Zolpidem Tartrate (AMBIEN PO) Take 10 mg by mouth nightly as needed.      . Multiple Vitamins-Minerals (HAIR/SKIN/NAILS) Tab Take 1 Tab by Mouth Once a Day. Indications: Vitamin Deficiency       Herbals:     OTC:     Allergies:     Allergies   Allergen Reactions   . Sulfa Antibiotics      rash   . Bactrim [Sulfamethoxazole W/Trimethoprim (Co-Trimoxazole)] Rash         Psychosocial / Family History:     History     Social History   . Marital Status: Legally Separated     Spouse Name: N/A     Number of Children: N/A   . Years of Education: N/A     Social History Main Topics   . Smoking status: Never Smoker    . Smokeless tobacco: Never Used   . Alcohol Use: Yes       Comment: occasionally   . Drug Use: No   . Sexual Activity: Not on file     Other Topics Concern   . Not on file     Social History Narrative       History reviewed. No pertinent family history.        Physical Exam:     Filed Vitals:    08/24/14 1745   BP: 122/82   Pulse: 89   Temp: 99.7 F (37.6 C)   Resp: 28   SpO2: 100%       Neuro: negative  HEENT:   Lungs: Heart exam - S1, S2 normal, no murmur, no gallop, rate regular  Cardiac: normal rate, regular rhythm, normal S1, S2, no murmurs, rubs, clicks or gallops  Abdomen: negative  Pelvic: normal external  genitalia, vulva, vagina, cervix, uterus and adnexa, exam chaperoned by female assistant  Surgical Site:   Assessment:   Retained products from recent termination of pregnancy    Plan:   Suction D&C    PRE-PROCEDURE DOCUMENTATION: The patient has been re-examined immediately prior to the planned procedure and is an appropriate candidate for the planned procedure. There are: (Pick an option from the list below)     No Significant Changes in H&P    The surgical indication, anticipated course, risks and alternatives to the proposed surgery, including the consequences of not having the surgery have been explained to the patient / guardian. Good understanding of these considerations has been demonstrated and she wishes to proceed.        Signed by: Maryruth Hancock

## 2014-08-24 NOTE — Transfer of Care (Signed)
Anesthesia Transfer of Care Note    Patient: [name]    Procedures performed: Procedure(s):  D & C, SUCTION    Anesthesia type: General TIVA    Patient location:Phase I PACU    Last vitals:   Filed Vitals:    08/24/14 1745   BP: 122/82   Pulse: 89   Temp: 37.6 C (99.7 F)   Resp: 28   SpO2: 100%       Post pain: Patient not complaining of pain, continue current therapy      Mental Status:awake    Respiratory Function: tolerating room air    Cardiovascular: stable    Nausea/Vomiting: patient not complaining of nausea or vomiting    Hydration Status: adequate    Post assessment: no apparent anesthetic complications

## 2014-08-24 NOTE — Anesthesia Postprocedure Evaluation (Signed)
Anesthesia Post Evaluation    Patient: [name]    Procedures performed: Procedure(s):  D & C, SUCTION    Anesthesia type: General TIVA    Patient location:Phase I PACU    Vitals: Reviewed and Stable.  See nurses notes for vital signs.      Post pain: Patient not complaining of pain, continue current therapy     Mental Status:awake    Respiratory Function: Doing well on room air    Cardiovascular: stable    Nausea/Vomiting: patient not complaining of nausea or vomiting    Hydration Status: adequate    Post assessment: no apparent anesthetic complications, no reportable events and no evidence of recall    Abagayle Klutts C. Filipescu-Turner, MD

## 2014-08-24 NOTE — ED Notes (Signed)
Pt to ED with c/o's of lower abdominal cramping, R sided flank pain and diarrhea. Diarrhea and flank pain started 24 hours. Decreased po intake starting on Monday. Subjective fever. + Dysuria.

## 2014-08-24 NOTE — ED Notes (Signed)
Patient reports that she had abortion (7wks preg) 2 weeks ago in [address]. Reports lower abdominal pain since the abortion but now has pain all the way up to her epigastric area. She has had n/v. She has decreased appetite ("I haven't eaten since Monday"). She reports temp of 101 at home. Denies chills.

## 2014-08-24 NOTE — Anesthesia Postprocedure Evaluation (Signed)
Anesthesia Post Evaluation    Patient: [name]    Procedures performed: Procedure(s):  D & C, SUCTION    Anesthesia type: General TIVA    Patient location:Phase I PACU    Vitals: Reviewed and Stable.  See nurses notes for vital signs.      Post pain: Patient not complaining of pain, continue current therapy     Mental Status:awake    Respiratory Function: Doing well on room air    Cardiovascular: stable    Nausea/Vomiting: patient not complaining of nausea or vomiting    Hydration Status: adequate    Post assessment: no apparent anesthetic complications, no reportable events and no evidence of recall    Vernona Rieger C. Filipescu-Turner, MD

## 2014-08-24 NOTE — Op Note (Signed)
Procedure Date: 08/24/2014     Patient Type: E     SURGEON: Maryruth Hancock MD  ASSISTANT:       PREOPERATIVE DIAGNOSIS:  Retained products from a recent abortion two weeks ago vaginal bleeding and  pain and a fever of 101 twenty four hours ago.     POSTOPERATIVE DIAGNOSIS:  Retained products from a recent abortion two weeks ago vaginal bleeding and  pain and a fever of 101 twenty four hours ago.     TITLE OF PROCEDURE:  Suction dilatation and curettage for retained products.     ANESTHESIA:  Sedation.     INDICATIONS:  This is a 28 year old gravida 2, para 0 female who was seven weeks pregnant  with a positive heartbeat and the ultrasound, who underwent a medical  abortion two weeks ago with medication orally.  She had started bleeding  heavy last night and had a fever last night and now presented to the ER  with pain and bleeding.  The ultrasound in the ER had shown retained  products and thickened endometrial lining.  She was therefore consented for  her dilatation and curettage procedure to suction out the retained  products.  The risks and benefits were discussed.     FINDINGS:  The uterus 7 cm, the OS was closed.  A small amount of retained products  were sent for pathology.     DESCRIPTION OF PROCEDURE:  The patient was taken to the operating room where she was prepped and  draped in semi lithotomy position.  A bimanual exam revealed a normal sized  uterus.  The os was dilated until a 6 mm suction curette could be  introduced and the cavity was explored and a small amount of retained  products were sent for pathology.  A gritty sensation was felt in all four  quadrants.  Intravenous Pitocin and antibiotics were given.  The patient  tolerated the procedure well and was then taken to the recovery room in  stable awake condition.           D:  08/24/2014 18:55 PM by Dr. Maryruth Hancock, MD ([id number])  T:  08/24/2014 19:31 PM by       Everlean Cherry: [id number]) (Doc ID: [id number])

## 2014-08-25 ENCOUNTER — Encounter: Payer: Self-pay | Admitting: Obstetrics & Gynecology

## 2014-08-28 LAB — LAB USE ONLY - HISTORICAL SURGICAL PATHOLOGY

## 2014-09-02 ENCOUNTER — Emergency Department: Payer: 59

## 2014-09-02 ENCOUNTER — Emergency Department
Admission: EM | Admit: 2014-09-02 | Discharge: 2014-09-02 | Disposition: A | Discharge status: Home / Self Care | Payer: 59 | Attending: Emergency Medical Services | Admitting: Emergency Medical Services

## 2014-09-02 DIAGNOSIS — E039 Hypothyroidism, unspecified: Secondary | ICD-10-CM | POA: Insufficient documentation

## 2014-09-02 DIAGNOSIS — F411 Generalized anxiety disorder: Secondary | ICD-10-CM

## 2014-09-02 DIAGNOSIS — F419 Anxiety disorder, unspecified: Secondary | ICD-10-CM

## 2014-09-02 DIAGNOSIS — K5090 Crohn's disease, unspecified, without complications: Secondary | ICD-10-CM | POA: Insufficient documentation

## 2014-09-02 LAB — COMPREHENSIVE METABOLIC PANEL
ALT: 44 U/L (ref 0–55)
AST (SGOT): 43 U/L — ABNORMAL HIGH (ref 5–34)
Albumin/Globulin Ratio: 1.2 (ref 0.9–2.2)
Albumin: 4.8 g/dL (ref 3.5–5.0)
Alkaline Phosphatase: 91 U/L (ref 37–106)
Anion Gap: 10 (ref 5.0–15.0)
BUN: 6 mg/dL — ABNORMAL LOW (ref 7–19)
Bilirubin, Total: 0.5 mg/dL (ref 0.2–1.2)
CO2: 25 mEq/L (ref 22–29)
Calcium: 10.6 mg/dL — ABNORMAL HIGH (ref 8.5–10.5)
Chloride: 102 mEq/L (ref 100–111)
Creatinine: 0.8 mg/dL (ref 0.6–1.0)
Globulin: 3.9 g/dL — ABNORMAL HIGH (ref 2.0–3.6)
Glucose: 101 mg/dL — ABNORMAL HIGH (ref 70–100)
Potassium: 4.2 mEq/L (ref 3.5–5.1)
Protein, Total: 8.7 g/dL — ABNORMAL HIGH (ref 6.0–8.3)
Sodium: 137 mEq/L (ref 136–145)

## 2014-09-02 LAB — CBC AND DIFFERENTIAL
Basophils Absolute Automated: 0.02 10*3/uL (ref 0.00–0.20)
Basophils Automated: 0 %
Eosinophils Absolute Automated: 0.08 10*3/uL (ref 0.00–0.70)
Eosinophils Automated: 1 %
Hematocrit: 43 % (ref 37.0–47.0)
Hgb: 14.5 g/dL (ref 12.0–16.0)
Immature Granulocytes Absolute: 0.01 10*3/uL
Immature Granulocytes: 0 %
Lymphocytes Absolute Automated: 2.94 10*3/uL (ref 0.50–4.40)
Lymphocytes Automated: 44 %
MCH: 31.9 pg (ref 28.0–32.0)
MCHC: 33.7 g/dL (ref 32.0–36.0)
MCV: 94.7 fL (ref 80.0–100.0)
MPV: 9.9 fL (ref 9.4–12.3)
Monocytes Absolute Automated: 0.65 10*3/uL (ref 0.00–1.20)
Monocytes: 10 %
Neutrophils Absolute: 3.02 10*3/uL (ref 1.80–8.10)
Neutrophils: 45 %
Nucleated RBC: 0 /100 WBC (ref 0–1)
Platelets: 421 10*3/uL — ABNORMAL HIGH (ref 140–400)
RBC: 4.54 10*6/uL (ref 4.20–5.40)
RDW: 12 % (ref 12–15)
WBC: 6.71 10*3/uL (ref 3.50–10.80)

## 2014-09-02 LAB — TSH: TSH: 1.33 u[IU]/mL (ref 0.35–4.94)

## 2014-09-02 LAB — GFR: EGFR: >60

## 2014-09-02 LAB — MAGNESIUM: Magnesium: 2.5 mg/dL (ref 1.6–2.6)

## 2014-09-02 MED ORDER — SODIUM CHLORIDE 0.9 % IV BOLUS
1000.0000 mL | Freq: Once | INTRAVENOUS | Status: AC
Start: 2014-09-02 — End: 2014-09-02
  Administered 2014-09-02: 1000 mL via INTRAVENOUS

## 2014-09-02 NOTE — ED Provider Notes (Signed)
Physician/Midlevel provider first contact with patient: 09/02/14 0640         [facility] EMERGENCY DEPARTMENT HISTORY AND PHYSICAL EXAM    Patient Name: [name], [name]  Encounter Date:  09/02/2014  Rendering Provider: Coral Else, MD  Patient DOB:  1986-04-11  MRN:  [id number]    History of Presenting Illness     Historian: [name]    28 y.o. female h/o Crohns disease, anxiety, and induced abortion p/w persistent numbness in hands and feet, described as a "cold feeling," for the last ~7 hours.  Associated with nausea and dizziness.  [name] had DNC done 10 days ago.  [name] followed up with her OB/GYN who gave her Methergine that she took for the first time yesterday.  No BM since last Thursday.  No fevers, SOB, or depression.      PMD:  Schutter, Elenore Paddy, NP    Past Medical History     Past Medical History   Diagnosis Date   . Crohn's disease    . H/O Hashimoto thyroiditis      hypothyroidism   . Anxiety    . Abortion        Past Surgical History     Past Surgical History   Procedure Laterality Date   . Breast implant     . Induced abortion     . D & c, suction N/A 08/24/2014     Procedure: D & C, SUCTION;  Surgeon: Maryruth Hancock, MD;  Location: [facility];  Service: Obstetrics;  Laterality: N/A;       Family History     History reviewed. No pertinent family history.    Social History     History     Social History   . Marital Status: Divorced     Spouse Name: N/A     Number of Children: N/A   . Years of Education: N/A     Social History Main Topics   . Smoking status: Never Smoker    . Smokeless tobacco: Never Used   . Alcohol Use: Yes      Comment: occasionally   . Drug Use: No   . Sexual Activity: Not on file     Other Topics Concern   . Not on file     Social History Narrative       Home Medications     Home medications reviewed by ED MD.     Previous Medications    ALPRAZOLAM (XANAX PO)    Take 0.5 mg by mouth as needed.       CLONAZEPAM (KLONOPIN) 0.5 MG TABLET    Take 1 tablet (0.5 mg total) by mouth 2  (two) times daily as needed for Anxiety.    FISH OIL-OMEGA-3 FATTY ACIDS 1000 MG CAPSULE    Take 2 g by mouth daily.    IBUPROFEN (MOTRIN PO)    Take by mouth.    LEVOTHYROXINE (SYNTHROID, LEVOTHROID) 88 MCG TABLET    Take 88 mcg by mouth daily.    METHYLERGONOVINE (METHERGINE) 0.2 MG TABLET    Take 0.2 mg by mouth every 6 (six) hours.    MULTIPLE VITAMINS-MINERALS (HAIR/SKIN/NAILS) TAB    Take 1 Tab by Mouth Once a Day. Indications: Vitamin Deficiency    OXYCODONE-ACETAMINOPHEN (PERCOCET) 5-325 MG PER TABLET    Take 1 tablet by mouth every 4 (four) hours as needed for Pain.    ZOLPIDEM TARTRATE (AMBIEN PO)    Take 10 mg by  mouth nightly as needed.          Review of Systems     Constitutional:  No fevers  GI: +nausea  Resp:  No SOB  Neuro: +numbness in hands and feet, +dizziness  Psych:  No depression  All other systems reviewed and negative      Physical Exam     BP 99/57 mmHg  Pulse 78  Temp(Src) 97.8 F (36.6 C)  Resp 18  Ht 1.549 m  Wt 40.37 kg  BMI 16.83 kg/m2  SpO2 99%  LMP 06/16/2014    CONSTITUTIONAL  Patient is afebrile, Vital signs reviewed.  HEAD  Atraumatic, Normocephallc.  EYES   Eyes are normal to inspection, PERRL, No discharge from eyes,  ENT  Ears normal to inspection, Nose examination normal, Posterior pharynx normal.  NECK   Normal ROM, No jugular venous distention, No meningeal signs.  RESPIRATORY CHEST   Chest is nontender, Breath sounds normal.  CARDIOVASCULAR   RRR, Heart sounds normal, Normal S1 S2.  ABDOMEN  Abdomen is nontender, No pulsatile masses, No other masses,  Bowel sounds normal, No distension, No peritoneal signs.  BACK  There is no CVA Tenderness, There is no tenderness to palpation.   UPPER EXTREMITY  Inspection normal, No cyanosis, Cool extremities.  LOWER EXTREMITY  Inspection normal, No cyanosis.   NEURO  GCS Is 15, No focal motor deficits, No focal sensory deficits  SKIN   Skin is warm, Skin is dry, Skin is normal color  LYMPHATIC   No adenopathy in  neck.  PSYCHIATRIC Oriented X 3, Normal affect. Normal insight, Tearful at times.        ED Medications Administered     ED Medication Orders     Start     Status Ordering Provider    09/02/14 [phone]  sodium chloride 0.9 % bolus 1,000 mL   Once     Route: Intravenous  Ordered Dose: 1,000 mL     Last MAR action:  New Bag Sheilia Reznick A          Orders Placed During This Encounter     Orders Placed This Encounter   Procedures   . CBC and differential   . Comprehensive metabolic panel   . TSH   . Magnesium   . GFR       Diagnostic Study Results     The results of the diagnostic studies below were reviewed by the ED provider:    Labs  Results     Procedure Component Value Units Date/Time    Comprehensive metabolic panel [[id number]]  (Abnormal) Collected:  09/02/14 0714    Specimen Information:  Blood Updated:  09/02/14 0739     Glucose 101 (H) mg/dL      BUN 6 (L) mg/dL      Creatinine 0.8 mg/dL      Sodium 725 mEq/L      Potassium 4.2 mEq/L      Chloride 102 mEq/L      CO2 25 mEq/L      CALCIUM 10.6 (H) mg/dL      Protein, Total 8.7 (H) g/dL      Albumin 4.8 g/dL      AST (SGOT) 43 (H) U/L      ALT 44 U/L      Alkaline Phosphatase 91 U/L      Bilirubin, Total 0.5 mg/dL      Globulin 3.9 (H) g/dL      Albumin/Globulin Ratio 1.2  Anion Gap 10.0     Magnesium [[id number]] Collected:  09/02/14 0714    Specimen Information:  Blood Updated:  09/02/14 0739     Magnesium 2.5 mg/dL     GFR [[id number]] Collected:  09/02/14 0714     EGFR >60.0 Updated:  09/02/14 0739    CBC and differential [[id number]]  (Abnormal) Collected:  09/02/14 0714    Specimen Information:  Blood / Blood Updated:  09/02/14 0728     WBC 6.71 x10 3/uL      RBC 4.54 x10 6/uL      Hgb 14.5 g/dL      Hematocrit 08.6 %      MCV 94.7 fL      MCH 31.9 pg      MCHC 33.7 g/dL      RDW 12 %      Platelets 421 (H) x10 3/uL      MPV 9.9 fL      Neutrophils 45 %      Lymphocytes Automated 44 %      Monocytes 10 %      Eosinophils Automated 1 %      Basophils  Automated 0 %      Immature Granulocyte 0 %      Nucleated RBC 0 /100 WBC      Neutrophils Absolute 3.02 x10 3/uL      Abs Lymph Automated 2.94 x10 3/uL      Abs Mono Automated 0.65 x10 3/uL      Abs Eos Automated 0.08 x10 3/uL      Absolute Baso Automated 0.02 x10 3/uL      Absolute Immature Granulocyte 0.01 x10 3/uL     TSH [[id number]] Collected:  09/02/14 5284    Specimen Information:  Blood Updated:  09/02/14 0715          Radiologic Studies  Radiology Results (24 Hour)     ** No results found for the last 24 hours. **          Scribe and MD Attestations     I, Coral Else, MD, personally performed the services documented. Max Servando Snare is scribing for me on [place],Punam M. I reviewed and confirm the accuracy of the information in this medical record.    I, Max Ruge, am serving as a Neurosurgeon to document services personally performed by Coral Else, MD, based on the provider's statements to me.     Rendering Provider: Coral Else, MD    Monitors, EKG, Critical Care, and Splints     EKG (interpreted by ED physician): na  Cardiac Monitor (interpreted by ED physician): na    Critical Care:   Splint check:      MDM and Clinical Notes     Notes:    Consults:    Diagnosis and Disposition     Clinical Impression  1. Anxiety        Disposition  ED Disposition     Discharge Sherre Lain discharge to home/self care.    Condition at disposition: Stable            Prescriptions       New Prescriptions    No medications on file               Coral Else, MD  09/11/14 [phone]

## 2014-09-02 NOTE — ED Notes (Signed)
Pt states she had a D&C last week. Started taking methylergonovine last night and then at midnight started having diffuse numbness and tingling throughout body. C/o discomfort to arms and legs decribed as pins and needles. +dizziness, +nausea. Pt alert and oriented.

## 2014-09-02 NOTE — ED Notes (Signed)
Dr. Sitta at bedside

## 2014-09-02 NOTE — Discharge Instructions (Signed)
Dear [name]:    I appreciate your choosing the [facility] for your healthcare needs, and hope your visit today was EXCELLENT.    Instructions:  Please follow-up with Dr. Dellis Filbert (your primary care physician) in one week.    Please call back within 48 hours for your TSH results.    Return to the Emergency Department for any worsening symptoms or concerns.    Below is some information that our patients often find helpful.    We wish you good health and please do not hesitate to contact us if we can ever be of any assistance.    Sincerely,  Coral Else, MD  [facility]    ________________________________________________________________    If you do not continue to improve or your condition worsens, please contact your doctor or return immediately to the Emergency Department.    Thank you for choosing [facility] for your emergency care needs.  We strive to provide EXCELLENT care to you and your family.      DOCTOR REFERRALS  Call [phone] if you need any further referrals and we can help you find a primary care doctor or specialist.  Also, available online at:  [email or web address]    YOUR CONTACT INFORMATION  Before leaving please check with registration to make sure we have an up-to-date contact number.  You can call registration at [phone] to update your information.  For questions about your hospital bill, please call [phone].  For questions about your Emergency Dept Physician bill please call [phone].      FREE HEALTH SERVICES  If you need help with health or social services, please call 2-1-1 for a free referral to resources in your area.  2-1-1 is a free service connecting people with information on health insurance, free clinics, pregnancy, mental health, dental care, food assistance, housing, and substance abuse counseling.  Also, available online at:  http://www.[facility].org    MEDICAL  RECORDS AND TESTS  Certain laboratory test results do not come back the same day, for example urine cultures.   We will contact you if other important findings are noted.  Radiology films are often reviewed again to ensure accuracy.  If there is any discrepancy, we will notify you.      Please call [phone] to pick up a complimentary CD of any radiology studies performed.  If you or your doctor would like to request a copy of your medical records, please call [phone].      ORTHOPEDIC INJURY   Please know that significant injuries can exist even when an initial x-ray is read as normal or negative.  This can occur because some fractures (broken bones) are not initially visible on x-rays.  For this reason, close outpatient follow-up with your primary care doctor or bone specialist (orthopedist) is required.    MEDICATIONS AND FOLLOWUP  Please be aware that some prescription medications can cause drowsiness.  Use caution when driving or operating machinery.    The examination and treatment you have received in our Emergency Department is provided on an emergency basis, and is not intended to be a substitute for your primary care physician.  It is important that your doctor checks you again and that you report any new or remaining problems at that time.      [address] [facility]  [facility] - [address] [place], [place] [address] (1.4 miles, 7 minutes)  [place], [place], [place] [address] (6.5 miles, 13 minutes)  Handout with directions available on request

## 2014-09-17 ENCOUNTER — Emergency Department: Payer: 59

## 2014-09-17 ENCOUNTER — Emergency Department
Admission: EM | Admit: 2014-09-17 | Discharge: 2014-09-18 | Disposition: A | Discharge status: Home / Self Care | Payer: 59 | Attending: Emergency Medicine | Admitting: Emergency Medicine

## 2014-09-17 DIAGNOSIS — E039 Hypothyroidism, unspecified: Secondary | ICD-10-CM | POA: Insufficient documentation

## 2014-09-17 DIAGNOSIS — R002 Palpitations: Secondary | ICD-10-CM | POA: Insufficient documentation

## 2014-09-17 DIAGNOSIS — E063 Autoimmune thyroiditis: Secondary | ICD-10-CM | POA: Insufficient documentation

## 2014-09-17 HISTORY — DX: Tachycardia, unspecified: R00.0

## 2014-09-17 LAB — URINALYSIS, REFLEX TO MICROSCOPIC EXAM IF INDICATED
Bilirubin, UA: NEGATIVE
Blood, UA: NEGATIVE
Glucose, UA: NEGATIVE
Ketones UA: NEGATIVE
Leukocyte Esterase, UA: NEGATIVE
Nitrite, UA: NEGATIVE
Protein, UR: NEGATIVE
Specific Gravity UA: 1.01 (ref 1.001–1.035)
Urine pH: 6.5 (ref 5.0–8.0)
Urobilinogen, UA: 0.2 mg/dL (ref 0.2–2.0)

## 2014-09-17 LAB — COMPREHENSIVE METABOLIC PANEL
ALT: 21 U/L (ref 0–55)
AST (SGOT): 20 U/L (ref 5–34)
Albumin/Globulin Ratio: 1.1 (ref 0.9–2.2)
Albumin: 4 g/dL (ref 3.5–5.0)
Alkaline Phosphatase: 60 U/L (ref 37–106)
Anion Gap: 11 (ref 5.0–15.0)
BUN: 8.8 mg/dL (ref 7.0–19.0)
Bilirubin, Total: 0.8 mg/dL (ref 0.2–1.2)
CO2: 21 mEq/L — ABNORMAL LOW (ref 22–29)
Calcium: 9.8 mg/dL (ref 8.5–10.5)
Chloride: 104 mEq/L (ref 100–111)
Creatinine: 0.8 mg/dL (ref 0.6–1.0)
Globulin: 3.5 g/dL (ref 2.0–3.6)
Glucose: 139 mg/dL — ABNORMAL HIGH (ref 70–100)
Potassium: 3.6 mEq/L (ref 3.5–5.1)
Protein, Total: 7.5 g/dL (ref 6.0–8.3)
Sodium: 136 mEq/L (ref 136–145)

## 2014-09-17 LAB — CBC AND DIFFERENTIAL
Basophils Absolute Automated: 0.02 10*3/uL (ref 0.00–0.20)
Basophils Automated: 0 %
Eosinophils Absolute Automated: 0.02 10*3/uL (ref 0.00–0.70)
Eosinophils Automated: 0 %
Hematocrit: 36.6 % — ABNORMAL LOW (ref 37.0–47.0)
Hgb: 12.7 g/dL (ref 12.0–16.0)
Lymphocytes Absolute Automated: 3.27 10*3/uL (ref 0.50–4.40)
Lymphocytes Automated: 25 %
MCH: 32.6 pg — ABNORMAL HIGH (ref 28.0–32.0)
MCHC: 34.7 g/dL (ref 32.0–36.0)
MCV: 93.8 fL (ref 80.0–100.0)
MPV: 9.6 fL (ref 9.4–12.3)
Monocytes Absolute Automated: 0.92 10*3/uL (ref 0.00–1.20)
Monocytes: 7 %
Neutrophils Absolute: 8.65 10*3/uL — ABNORMAL HIGH (ref 1.80–8.10)
Neutrophils: 67 %
Platelets: 463 10*3/uL — ABNORMAL HIGH (ref 140–400)
RBC: 3.9 10*6/uL — ABNORMAL LOW (ref 4.20–5.40)
RDW: 11 % — ABNORMAL LOW (ref 12–15)
WBC: 12.88 10*3/uL — ABNORMAL HIGH (ref 3.50–10.80)

## 2014-09-17 LAB — IHS D-DIMER: D-Dimer: <0.27 ug/mL FEU (ref 0.00–0.51)

## 2014-09-17 LAB — I-STAT TROPONIN: i-STAT Troponin: 0 ng/mL (ref 0.00–0.09)

## 2014-09-17 LAB — GFR: EGFR: >60

## 2014-09-17 LAB — HCG, SERUM, QUALITATIVE: Hcg Qualitative: NEGATIVE

## 2014-09-17 LAB — LIPASE: Lipase: 19 U/L (ref 8–78)

## 2014-09-17 MED ORDER — SODIUM CHLORIDE 0.9 % IV BOLUS
1000.0000 mL | Freq: Once | INTRAVENOUS | Status: AC
Start: 2014-09-17 — End: 2014-09-17
  Administered 2014-09-17: 1000 mL via INTRAVENOUS

## 2014-09-17 MED ORDER — ONDANSETRON HCL 4 MG/2ML IJ SOLN
4.0000 mg | Freq: Once | INTRAMUSCULAR | Status: AC
Start: 2014-09-17 — End: 2014-09-17
  Administered 2014-09-17: 4 mg via INTRAVENOUS
  Filled 2014-09-17: qty 2

## 2014-09-17 MED ORDER — HYDROMORPHONE HCL PF 1 MG/ML IJ SOLN
0.5000 mg | Freq: Once | INTRAMUSCULAR | Status: AC
Start: 2014-09-17 — End: 2014-09-17
  Administered 2014-09-17: 0.5 mg via INTRAVENOUS
  Filled 2014-09-17: qty 1

## 2014-09-17 MED ORDER — ONDANSETRON HCL 4 MG/2ML IJ SOLN
4.0000 mg | Freq: Once | INTRAMUSCULAR | Status: AC
Start: 2014-09-17 — End: 2014-09-18
  Administered 2014-09-18: 4 mg via INTRAVENOUS
  Filled 2014-09-17: qty 2

## 2014-09-17 NOTE — ED Provider Notes (Signed)
Physician/Midlevel provider first contact with patient: 09/17/14 2157         History     Chief Complaint   Patient presents with   . Palpitations   . Tachycardia     HPI Comments: 28 pw palpitaions/tachycardia x 1 hour.  Onset while watchng TV.  Has been told has tachycardia over past 2 yrs attributed to anxiety.  +left sided chest pain.  Constant. No radiation, not pleuritic  +SOB mild  Nonsmoker no travel no h/o hypercoaguable +OCPs  +left sided abd pain similar to prior chron's since age 75.  Followed by [facility] and today started bentyl for abd pain  No leg pain/edema  No cough sputum fever    Patient is a 28 y.o. female presenting with palpitations. The history is provided by the patient.   Palpitations  Palpitations quality:  Fast  Onset quality:  Sudden  Duration:  1 hour  Timing:  Constant           Past Medical History   Diagnosis Date   . Crohn's disease    . H/O Hashimoto thyroiditis      hypothyroidism   . Anxiety    . Abortion    . Tachycardia        Past Surgical History   Procedure Laterality Date   . Breast implant     . Induced abortion     . D & c, suction N/A 08/24/2014     Procedure: D & C, SUCTION;  Surgeon: Maryruth Hancock, MD;  Location: [place];  Service: Obstetrics;  Laterality: N/A;       No family history on file.    Social  History   Substance Use Topics   . Smoking status: Never Smoker    . Smokeless tobacco: Never Used   . Alcohol Use: Yes      Comment: socially       .     Allergies   Allergen Reactions   . Bactrim [Sulfamethoxazole W/Trimethoprim (Co-Trimoxazole)] Rash   . Sulfa Antibiotics Rash       Discharge Medication List as of 09/17/2014 11:59 PM      CONTINUE these medications which have NOT CHANGED    Details   clonazePAM (KLONOPIN) 1 MG tablet Take 1 mg by mouth daily., Until Discontinued, Historical Med      Dicyclomine HCl (BENTYL PO) Take by mouth as needed., Until Discontinued, Historical Med      norelgestromin-ethinyl estradiol (ORTHO EVRA) 150-35 MCG/24HR Place 1  patch onto the skin once a week., Until Discontinued, Historical Med      ALPRAZolam (XANAX PO) Take 0.5 mg by mouth as needed.   , Until Discontinued, Historical Med      fish oil-omega-3 fatty acids 1000 MG capsule Take 2 g by mouth daily., Until Discontinued, Historical Med      Ibuprofen (MOTRIN PO) Take by mouth as needed.   , Until Discontinued, Historical Med      levothyroxine (SYNTHROID, LEVOTHROID) 88 MCG tablet Take 88 mcg by mouth daily., Until Discontinued, Historical Med      Multiple Vitamins-Minerals (HAIR/SKIN/NAILS) Tab Take 1 Tab by Mouth Once a Day. Indications: Vitamin Deficiency, Until Discontinued, Historical Med      Zolpidem Tartrate (AMBIEN PO) Take 10 mg by mouth nightly as needed.   , Until Discontinued, Historical Med              Review of Systems   Cardiovascular: Positive for palpitations.  All other systems reviewed and are negative.      Physical Exam    BP: 132/67 mmHg, Heart Rate: (!) 134, Resp Rate: (!) 24, SpO2: 100 %, Weight: (!) 40.37 kg    Physical Exam   Constitutional: She is oriented to person, place, and time. She appears well-developed and well-nourished.   HENT:   Head: Normocephalic and atraumatic.   Mouth/Throat: Oropharynx is clear and moist.   Eyes: Conjunctivae and EOM are normal. Pupils are equal, round, and reactive to light.   Neck: Normal range of motion. Neck supple.   Cardiovascular: Normal rate, regular rhythm and normal heart sounds.    Pulmonary/Chest: Effort normal and breath sounds normal.   Abdominal: Soft. Bowel sounds are normal. There is no tenderness. There is no rebound and no guarding.   Musculoskeletal: Normal range of motion. She exhibits no edema (no palpable cords).   Neurological: She is alert and oriented to person, place, and time. She has normal reflexes.   Skin: Skin is warm and dry.   Psychiatric: She has a normal mood and affect. Thought content normal.       MDM and ED Course     ED Medication Orders     Start     Status Ordering  Provider    09/17/14 2343  ondansetron [facility]) injection 4 mg   Once     Route: Intravenous  Ordered Dose: 4 mg     Last MAR action:  Given Samuel Bouche    09/17/14 2234  sodium chloride 0.9 % bolus 1,000 mL   Once     Route: Intravenous  Ordered Dose: 1,000 mL     Last MAR action:  Stopped Samuel Bouche    09/17/14 2220  sodium chloride 0.9 % bolus 1,000 mL   Once     Route: Intravenous  Ordered Dose: 1,000 mL     Last MAR action:  Stopped Felicity Coyer J    09/17/14 2220  HYDROmorphone (DILAUDID) injection 0.5 mg   Once     Route: Intravenous  Ordered Dose: 0.5 mg     Last MAR action:  Given Praveen Coia J    09/17/14 2220  ondansetron (ZOFRAN) injection 4 mg   Once     Route: Intravenous  Ordered Dose: 4 mg     Last MAR action:  Given Shalom Ware J              MDM     ekg sinus 121 NSTT wave changes nonspecific ekg  Procedures    Clinical Impression & Disposition     Clinical Impression  Final diagnoses:   Palpitations        ED Disposition     Discharge [name] discharge to home/self care.    Condition at disposition: Stable             Discharge Medication List as of 09/17/2014 11:59 PM                    Samuel Bouche, MD  09/20/14 [phone]

## 2014-09-17 NOTE — ED Notes (Signed)
C/O palpitations and tachycardia, onset 1 hour ago, also c/o chest/abdominal pain, +mild SOB, has history of sinus tachycardia

## 2014-09-18 LAB — ECG 12-LEAD
Atrial Rate: 121 {beats}/min
P Axis: 61 degrees
P-R Interval: 130 ms
Q-T Interval: 314 ms
QRS Duration: 70 ms
QTC Calculation (Bezet): 445 ms
R Axis: 73 degrees
T Axis: 8 degrees
Ventricular Rate: 121 {beats}/min

## 2014-09-18 NOTE — ED Notes (Signed)
Pt c/o nausea. Dr Orlene Och notified.

## 2014-09-18 NOTE — Discharge Instructions (Signed)
Palpitations     You have been diagnosed with "palpitations."     Palpitations are beats in the chest that feel funny or strange. They are often caused by extra heartbeats that come earlier than normal. These are either "premature atrial contractions" or "premature ventricular contractions," depending on where in the heart they happen. Palpitations often go away on their own and often cause no serious problems. They are sometimes related to stress or lack of sleep. They can also be related too much caffeine. These symptoms can be caused by many over-the-counter (no prescription needed) cold medicines, diet pills and "natural" vitamin supplements with stimulants, often ephedrine (Ephedrine is also known by its traditional Chinese name, Ma huang).     Palpitations feel different for different people. Some patients describe a feeling of "butterflies" in the chest. Others say it feels like the heart is "flipping over" in the chest. Palpitations may happen often but should last only a second or two each time. They should not cause any chest pain, lightheadedness, dizziness or fainting.     There is no specific treatment for palpitations. However, you should avoid all caffeine and cold medications. Also avoid all natural stimulants and chocolate.     Follow up with your primary doctor in the next week to make sure your symptoms are getting better. In some cases, a Holter monitor may be ordered. A Holter monitor is a portable heart monitor. It records your heart's electrical rhythm. You will need to see your regular doctor to get the results from the test.     YOU SHOULD SEEK MEDICAL ATTENTION IMMEDIATELY, EITHER HERE OR AT THE NEAREST EMERGENCY DEPARTMENT, IF ANY OF THE FOLLOWING OCCURS:  · Lightheadedness or the feeling you might faint.  · An unusually fast or slow heart rate.  · Chest pain or shortness of breath.  · Palpitations increase when you exercise.  · Any other worsening symptoms or concerns.

## 2014-09-18 NOTE — ED Notes (Signed)
Pt d/c to home. Stable, ambulates with steady gait. All belongings with pt.     Advised pt not to drive tonight due to aide affects of medication. Pt verbalized understanding. States that she is getting a ride.

## 2014-10-22 ENCOUNTER — Emergency Department
Admission: EM | Admit: 2014-10-22 | Discharge: 2014-10-22 | Disposition: A | Discharge status: Home / Self Care | Payer: 59 | Attending: Emergency Medicine | Admitting: Emergency Medicine

## 2014-10-22 ENCOUNTER — Emergency Department: Payer: 59

## 2014-10-22 DIAGNOSIS — E039 Hypothyroidism, unspecified: Secondary | ICD-10-CM | POA: Insufficient documentation

## 2014-10-22 DIAGNOSIS — R002 Palpitations: Secondary | ICD-10-CM | POA: Insufficient documentation

## 2014-10-22 LAB — URINALYSIS, REFLEX TO MICROSCOPIC EXAM IF INDICATED
Bilirubin, UA: NEGATIVE
Glucose, UA: NEGATIVE
Ketones UA: NEGATIVE
Nitrite, UA: NEGATIVE
Protein, UR: NEGATIVE
Specific Gravity UA: 1.02 (ref 1.001–1.035)
Urine pH: 7 (ref 5.0–8.0)
Urobilinogen, UA: 0.2 mg/dL (ref 0.2–2.0)

## 2014-10-22 LAB — CBC AND DIFFERENTIAL
Basophils Absolute Automated: 0.02 10*3/uL (ref 0.00–0.20)
Basophils Automated: 0 %
Eosinophils Absolute Automated: 0.07 10*3/uL (ref 0.00–0.70)
Eosinophils Automated: 1 %
Hematocrit: 35.1 % — ABNORMAL LOW (ref 37.0–47.0)
Hgb: 11.6 g/dL — ABNORMAL LOW (ref 12.0–16.0)
Lymphocytes Absolute Automated: 2.21 10*3/uL (ref 0.50–4.40)
Lymphocytes Automated: 24 %
MCH: 32.3 pg — ABNORMAL HIGH (ref 28.0–32.0)
MCHC: 33 g/dL (ref 32.0–36.0)
MCV: 97.8 fL (ref 80.0–100.0)
MPV: 9 fL — ABNORMAL LOW (ref 9.4–12.3)
Monocytes Absolute Automated: 1.13 10*3/uL (ref 0.00–1.20)
Monocytes: 12 %
Neutrophils Absolute: 5.72 10*3/uL (ref 1.80–8.10)
Neutrophils: 62 %
Platelets: 351 10*3/uL (ref 140–400)
RBC: 3.59 10*6/uL — ABNORMAL LOW (ref 4.20–5.40)
RDW: 12 % (ref 12–15)
WBC: 9.15 10*3/uL (ref 3.50–10.80)

## 2014-10-22 LAB — COMPREHENSIVE METABOLIC PANEL
ALT: 16 U/L (ref 0–55)
AST (SGOT): 22 U/L (ref 5–34)
Albumin/Globulin Ratio: 1.1 (ref 0.9–2.2)
Albumin: 3.5 g/dL (ref 3.5–5.0)
Alkaline Phosphatase: 60 U/L (ref 37–106)
Anion Gap: 12 (ref 5.0–15.0)
BUN: 9.7 mg/dL (ref 7.0–19.0)
Bilirubin, Total: 0.4 mg/dL (ref 0.2–1.2)
CO2: 24 mEq/L (ref 22–29)
Calcium: 9.2 mg/dL (ref 8.5–10.5)
Chloride: 105 mEq/L (ref 100–111)
Creatinine: 0.7 mg/dL (ref 0.6–1.0)
Globulin: 3.3 g/dL (ref 2.0–3.6)
Glucose: 86 mg/dL (ref 70–100)
Potassium: 3.8 mEq/L (ref 3.5–5.1)
Protein, Total: 6.8 g/dL (ref 6.0–8.3)
Sodium: 141 mEq/L (ref 136–145)

## 2014-10-22 LAB — LIPASE: Lipase: 8 U/L (ref 8–78)

## 2014-10-22 LAB — GFR: EGFR: >60

## 2014-10-22 LAB — URINE HCG QUALITATIVE: Urine HCG Qualitative: NEGATIVE

## 2014-10-22 LAB — TSH: TSH: 0.15 u[IU]/mL — ABNORMAL LOW (ref 0.35–4.94)

## 2014-10-22 MED ORDER — HYDROMORPHONE HCL 1 MG/ML IJ SOLN
1.0000 mg | Freq: Once | INTRAMUSCULAR | Status: AC
Start: 2014-10-22 — End: 2014-10-22
  Administered 2014-10-22: 1 mg via INTRAVENOUS
  Filled 2014-10-22: qty 1

## 2014-10-22 MED ORDER — SODIUM CHLORIDE 0.9 % IV BOLUS
1000.0000 mL | Freq: Once | INTRAVENOUS | Status: AC
Start: 2014-10-22 — End: 2014-10-22
  Administered 2014-10-22: 1000 mL via INTRAVENOUS

## 2014-10-22 MED ORDER — ONDANSETRON HCL 4 MG/2ML IJ SOLN
4.0000 mg | Freq: Once | INTRAMUSCULAR | Status: AC
Start: 2014-10-22 — End: 2014-10-22
  Administered 2014-10-22: 4 mg via INTRAVENOUS
  Filled 2014-10-22: qty 2

## 2014-10-22 MED ORDER — OXYCODONE-ACETAMINOPHEN 5-325 MG PO TABS
ORAL_TABLET | ORAL | Status: DC
Start: 2014-10-22 — End: 2016-10-24

## 2014-10-22 NOTE — ED Notes (Signed)
Pt resting comfortably, IVf infusing well, pending disposition

## 2014-10-22 NOTE — ED Notes (Signed)
Pt given a cup of water per MD's request.  Pt made aware of plan of care, UA needed.

## 2014-10-22 NOTE — ED Provider Notes (Signed)
Physician/Midlevel provider first contact with patient: 10/22/14 1830         EMERGENCY DEPARTMENT HISTORY AND PHYSICAL EXAM    Date Time: 10/25/2014 10:30 PM  Patient Name: [name]  Attending Physician: Merceda Elks MD.               History of Presenting Illness       Chief Complaint:   Chief Complaint   Patient presents with   . Palpitations   . Chest Pain   . Abdominal Pain       [name] is a 28 y.o. female, w/ hx of crohn's disease, hashimoto thyroiditis, anxiety, tachycardia and DNC, who presents with palpitations and diffuse abd pain that has gotten worse in the past 24 hours. Sx associated with nausea. No fever. Pt had a CT done a couple wks ago and was dx w/ small bowel ileus and severe inflammation of large intestines. Last BM yesterday and was normal. Is allergic to bactrim and sulfa. Pt is followed by GI.      This history was obtained from the(a) patient    Past Medical History     Past Medical History   Diagnosis Date   . Crohn's disease    . H/O Hashimoto thyroiditis      hypothyroidism   . Anxiety    . Abortion    . Tachycardia        Past Surgical History     Past Surgical History   Procedure Laterality Date   . Breast implant     . Induced abortion     . D & c, suction N/A 08/24/2014     Procedure: D & C, SUCTION;  Surgeon: Maryruth Hancock, MD;  Location: [facility];  Service: Obstetrics;  Laterality: N/A;       Family History     History reviewed. No pertinent family history.    Social History     History     Social History   . Marital Status: Divorced     Spouse Name: N/A     Number of Children: N/A   . Years of Education: N/A     Social History Main Topics   . Smoking status: Never Smoker    . Smokeless tobacco: Never Used   . Alcohol Use: Yes      Comment: socially   . Drug Use: No   . Sexual Activity: Not on file     Other Topics Concern   . Not on file     Social History Narrative       Allergies     Allergies   Allergen Reactions   . Bactrim [Sulfamethoxazole  W/Trimethoprim (Co-Trimoxazole)] Rash   . Sulfa Antibiotics Rash       Medications     No current facility-administered medications for this encounter.  Current outpatient prescriptions: budesonide (ENTOCORT EC) 3 MG 24 hr capsule, Take 6 mg by mouth., Disp: , Rfl: ;  ALPRAZolam (XANAX PO), Take 0.5 mg by mouth as needed.  , Disp: , Rfl: ;  clonazePAM (KLONOPIN) 1 MG tablet, Take 1 mg by mouth daily., Disp: , Rfl: ;  Dicyclomine HCl (BENTYL PO), Take by mouth as needed., Disp: , Rfl: ;  fish oil-omega-3 fatty acids 1000 MG capsule, Take 2 g by mouth daily., Disp: , Rfl:   Ibuprofen (MOTRIN PO), Take by mouth as needed.  , Disp: , Rfl: ;  levothyroxine (SYNTHROID, LEVOTHROID) 88 MCG tablet, Take  88 mcg by mouth daily., Disp: , Rfl: ;  Multiple Vitamins-Minerals (HAIR/SKIN/NAILS) Tab, Take 1 Tab by Mouth Once a Day. Indications: Vitamin Deficiency, Disp: , Rfl: ;  norelgestromin-ethinyl estradiol (ORTHO EVRA) 150-35 MCG/24HR, Place 1 patch onto the skin once a week., Disp: , Rfl:   oxyCODONE-acetaminophen (PERCOCET) 5-325 MG per tablet, 1-2 tablets by mouth every 4-6 hours as needed for pain;  Do not drive or operate machinery while taking this medicine, Disp: 15 tablet, Rfl: 0;  Zolpidem Tartrate (AMBIEN PO), Take 10 mg by mouth nightly as needed.  , Disp: , Rfl:     Review of Systems     Pertinent Positives and Negatives noted in the HPI.  All Other Systems Reviewed and Negative: Yes      Physical Exam     Physical Exam   Constitutional: She is well-developed, well-nourished, and in no distress. No distress.   Afebrile, thin, tachycardic, appears in mild discomfort   HENT:   Head: Normocephalic and atraumatic.   Dry MM's   Cardiovascular: Regular rhythm and normal heart sounds.    Tachycardic   Pulmonary/Chest: Effort normal and breath sounds normal.   Abdominal: Soft. Bowel sounds are normal. There is tenderness. There is no rebound and no guarding.   Soft, +BS, no distension, diffusely tender, no rebound or  guarding, +pierced umbilicus, +tattoo RLQ   Musculoskeletal: Normal range of motion. She exhibits no edema.   Neurological: She is alert.   Skin: Skin is warm and dry.   Psychiatric: Affect normal.   Nursing note and vitals reviewed.           Diagnostic Study Results     Labs -     Labs Reviewed   CBC AND DIFFERENTIAL - Abnormal; Notable for the following:     RBC 3.59 (*)     Hgb 11.6 (*)     Hematocrit 35.1 (*)     MCH 32.3 (*)     MPV 9.0 (*)     All other components within normal limits   URINALYSIS, REFLEX TO MICROSCOPIC EXAM IF INDICATED - Abnormal; Notable for the following:     Leukocyte Esterase, UA Small (*)     Blood, UA Trace (*)     WBC, UA 6 - 10 (*)     All other components within normal limits   TSH - Abnormal; Notable for the following:     Thyroid Stimulating Hormone 0.15 (*)     All other components within normal limits   COMPREHENSIVE METABOLIC PANEL   LIPASE   URINE HCG QUALITATIVE   GFR       Radiologic Studies -   Abdomen 2 View with Chest 1 View   Final Result   CLINICAL HISTORY: Abdominal pain. History of Crohn's disease.      FINDINGS: PA view of the chest and supine/upright views of the abdomen.   Compared with prior studies including 08/24/2014, 09/17/2014,   05/13/2014, and 11/03/2013      Cardiac silhouette is not enlarged. Pulmonary vascularity is normal. No   focal consolidation or free intraperitoneal air. Mild rightward   thoracolumbar spinal curvature. There is a lobulated density over the   left lateral hemithorax which could be related to superimposed density   given the presence of a lead in this area, pleural-based density, or   lobulated contour along the medial left scapula      Scattered gas and stool in portions of the colon. Small volume of gas  in   small bowel. No dilated loops of bowel or abnormal calcifications.      IMPRESSION:        1. Unremarkable bowel gas pattern.   2. Lobulated density along the left mid lateral chest. Recommend   dedicated views of the left  scapula.      Darra Lis, MD    10/22/2014 7:04 PM               Clinical Course in the Emergency Department/Medical Decision Making     I reviewed the vital signs, nursing notes, past medical history, past surgical history, family history and social history.    Vital Signs -   No data found.      Pulse Oximetry Analysis - nl without need for supplemental oxygen      Labs:I have reviewed the labs at the time of visit.  Merceda Elks MD.      Differential Diagnosis (not completely inclusive): Crohns Flare, Ileus, Obstruction, palpitations secondary to pain    EKg Sinus Tachycardia at 113, normal axis, no blocks, flattened T waves inferior leads    7:13 PM Awaiting labs and x-ray. Pt signed out to Dr. Toney Rakes.        Final diagnoses:   Palpitation     Discharge Medication List as of 10/22/2014  8:30 PM        ED Disposition     Discharge [name] discharge to home/self care.    Condition at disposition: Stable            _______________________________    Attestations:    I was acting as a scribe for Jobe Gibbon, MD on [facility]  Treatment Team: Scribe: Gerald Stabs   I am the first provider for this patient and I personally performed the services documented. Treatment Team: Scribe: Gerald Stabs is scribing for me on [facility]. This note accurately reflects work and decisions made by me.  Fadia Marlar, Estil Daft, MD_______________________________                  Jobe Gibbon, MD  10/25/14 2230

## 2014-10-22 NOTE — ED Notes (Signed)
Pt ambulates to room 8 with a steady gait; she is here for c/o palpitations and generalized chest pain that started about an hour ago; she also has c/o LLQ abdominal pain that started about 2 weeks ago, currently having a flare-up of her Crohn's

## 2014-10-22 NOTE — Discharge Instructions (Signed)
Palpitations     You have been diagnosed with "palpitations."     Palpitations are beats in the chest that feel funny or strange. They are often caused by extra heartbeats that come earlier than normal. These are either "premature atrial contractions" or "premature ventricular contractions," depending on where in the heart they happen. Palpitations often go away on their own and often cause no serious problems. They are sometimes related to stress or lack of sleep. They can also be related too much caffeine. These symptoms can be caused by many over-the-counter (no prescription needed) cold medicines, diet pills and "natural" vitamin supplements with stimulants, often ephedrine (Ephedrine is also known by its traditional Chinese name, Ma huang).     Palpitations feel different for different people. Some patients describe a feeling of "butterflies" in the chest. Others say it feels like the heart is "flipping over" in the chest. Palpitations may happen often but should last only a second or two each time. They should not cause any chest pain, lightheadedness, dizziness or fainting.     There is no specific treatment for palpitations. However, you should avoid all caffeine and cold medications. Also avoid all natural stimulants and chocolate.     Follow up with your primary doctor in the next week to make sure your symptoms are getting better. In some cases, a Holter monitor may be ordered. A Holter monitor is a portable heart monitor. It records your heart's electrical rhythm. You will need to see your regular doctor to get the results from the test.     YOU SHOULD SEEK MEDICAL ATTENTION IMMEDIATELY, EITHER HERE OR AT THE NEAREST EMERGENCY DEPARTMENT, IF ANY OF THE FOLLOWING OCCURS:  · Lightheadedness or the feeling you might faint.  · An unusually fast or slow heart rate.  · Chest pain or shortness of breath.  · Palpitations increase when you exercise.  · Any other worsening symptoms or concerns.

## 2014-10-22 NOTE — ED Notes (Signed)
Received report, pt with generalized abdominal pain 7/10, with nausea, stated relief of chest pain,  Sinus rhythm on monitor, with IV heplock (18g) on Rt AC, pending fluids & pain meds, pt medicated as ordered

## 2014-10-23 LAB — ECG 12-LEAD
Atrial Rate: 113 {beats}/min
P Axis: 73 degrees
P-R Interval: 128 ms
Q-T Interval: 314 ms
QRS Duration: 66 ms
QTC Calculation (Bezet): 430 ms
R Axis: 76 degrees
T Axis: -2 degrees
Ventricular Rate: 113 {beats}/min

## 2014-10-23 NOTE — ED Notes (Signed)
Added order for EKG after review of EKG Directory.

## 2014-10-28 ENCOUNTER — Encounter: Payer: Self-pay | Admitting: Emergency Medicine

## 2014-10-28 ENCOUNTER — Emergency Department
Admission: EM | Admit: 2014-10-28 | Discharge: 2014-10-28 | Disposition: A | Discharge status: Home / Self Care | Payer: 59 | Attending: Emergency Medicine | Admitting: Emergency Medicine

## 2014-10-28 ENCOUNTER — Emergency Department: Payer: 59

## 2014-10-28 DIAGNOSIS — R197 Diarrhea, unspecified: Secondary | ICD-10-CM | POA: Insufficient documentation

## 2014-10-28 DIAGNOSIS — K50919 Crohn's disease, unspecified, with unspecified complications: Secondary | ICD-10-CM | POA: Insufficient documentation

## 2014-10-28 DIAGNOSIS — Y92019 Unspecified place in single-family (private) house as the place of occurrence of the external cause: Secondary | ICD-10-CM | POA: Insufficient documentation

## 2014-10-28 DIAGNOSIS — T50905A Adverse effect of unspecified drugs, medicaments and biological substances, initial encounter: Secondary | ICD-10-CM | POA: Insufficient documentation

## 2014-10-28 LAB — CBC AND DIFFERENTIAL
Basophils %: 0.9 % (ref 0.0–3.0)
Basophils Absolute: 0.1 10*3/uL (ref 0.0–0.3)
Eosinophils %: 1 % (ref 0.0–7.0)
Eosinophils Absolute: 0.1 10*3/uL (ref 0.0–0.8)
Hematocrit: 38.1 % (ref 36.0–48.0)
Hemoglobin: 13.2 gm/dL (ref 12.0–16.0)
Lymphocytes Absolute: 3.3 10*3/uL (ref 0.6–5.1)
Lymphocytes: 34.4 % (ref 15.0–46.0)
MCH: 33 pg (ref 28–35)
MCHC: 35 gm/dL (ref 32–36)
MCV: 96 fL (ref 80–100)
MPV: 6.8 fL (ref 6.0–10.0)
Monocytes Absolute: 1.3 10*3/uL (ref 0.1–1.7)
Monocytes: 13.4 % (ref 3.0–15.0)
Neutrophils %: 50.3 % (ref 42.0–78.0)
Neutrophils Absolute: 4.9 10*3/uL (ref 1.7–8.6)
PLT CT: 464 10*3/uL — ABNORMAL HIGH (ref 130–440)
RBC: 3.96 10*6/uL (ref 3.80–5.00)
RDW: 11.3 % (ref 11.0–14.0)
WBC: 9.7 10*3/uL (ref 4.0–11.0)

## 2014-10-28 LAB — COMPREHENSIVE METABOLIC PANEL
ALT: 23 U/L (ref 0–55)
AST (SGOT): 18 U/L (ref 10–42)
Albumin/Globulin Ratio: 0.9 Ratio (ref 0.70–1.50)
Albumin: 3.5 gm/dL (ref 3.5–5.0)
Alkaline Phosphatase: 118 U/L (ref 40–145)
Anion Gap: 11.6 mMol/L (ref 7.0–18.0)
BUN / Creatinine Ratio: 11.3 Ratio (ref 10.0–30.0)
BUN: 8 mg/dL (ref 7–22)
Bilirubin, Total: 0.2 mg/dL (ref 0.1–1.2)
CO2: 27 mMol/L (ref 20.0–30.0)
Calcium: 9.7 mg/dL (ref 8.5–10.5)
Chloride: 106 mMol/L (ref 98–110)
Creatinine: 0.71 mg/dL (ref 0.60–1.20)
EGFR: >60 mL/min/{1.73_m2}
Globulin: 3.9 gm/dL (ref 2.0–4.0)
Glucose: 95 mg/dL (ref 70–99)
Osmolality Calc: 279 mOsm/kg (ref 275–300)
Potassium: 3.6 mMol/L (ref 3.5–5.3)
Protein, Total: 7.4 gm/dL (ref 6.0–8.3)
Sodium: 141 mMol/L (ref 136–147)

## 2014-10-28 LAB — SEDIMENTATION RATE: Sed Rate: 49 mm/hr — ABNORMAL HIGH (ref 0–20)

## 2014-10-28 LAB — HCG, SERUM, QUALITATIVE: BHCG Qualitative: NEGATIVE

## 2014-10-28 MED ORDER — ONDANSETRON HCL 4 MG/2ML IJ SOLN
4.0000 mg | Freq: Once | INTRAMUSCULAR | Status: AC
Start: 2014-10-28 — End: 2014-10-28
  Administered 2014-10-28: 4 mg via INTRAVENOUS

## 2014-10-28 MED ORDER — ONDANSETRON 4 MG PO TBDP
4.0000 mg | ORAL_TABLET | Freq: Three times a day (TID) | ORAL | Status: DC | PRN
Start: 2014-10-28 — End: 2016-10-24

## 2014-10-28 MED ORDER — SODIUM CHLORIDE 0.9 % IV BOLUS
1000.0000 mL | Freq: Once | INTRAVENOUS | Status: AC
Start: 2014-10-28 — End: 2014-10-28
  Administered 2014-10-28: 1000 mL via INTRAVENOUS

## 2014-10-28 MED ORDER — VH HYDROMORPHONE HCL PF 1 MG/ML CARPUJECT
1.0000 mg | Freq: Once | INTRAMUSCULAR | Status: AC
Start: 2014-10-28 — End: 2014-10-28
  Administered 2014-10-28: 1 mg via INTRAVENOUS

## 2014-10-28 MED ORDER — HYDROCODONE-ACETAMINOPHEN 5-325 MG PO TABS
1.0000 | ORAL_TABLET | Freq: Four times a day (QID) | ORAL | Status: DC | PRN
Start: 2014-10-28 — End: 2016-10-24

## 2014-10-28 MED ORDER — KETOROLAC TROMETHAMINE 60 MG/2ML IM SOLN
INTRAMUSCULAR | Status: AC
Start: 2014-10-28 — End: ?
  Filled 2014-10-28: qty 1

## 2014-10-28 MED ORDER — METHYLPREDNISOLONE SODIUM SUCC 125 MG IJ SOLR
INTRAMUSCULAR | Status: AC
Start: 2014-10-28 — End: ?
  Filled 2014-10-28: qty 2

## 2014-10-28 MED ORDER — ONDANSETRON HCL 4 MG/2ML IJ SOLN
INTRAMUSCULAR | Status: AC
Start: 2014-10-28 — End: ?
  Filled 2014-10-28: qty 2

## 2014-10-28 MED ORDER — BUDESONIDE ER 3 MG PO CP24
6.0000 mg | ORAL_CAPSULE | Freq: Every morning | ORAL | Status: DC
Start: 2014-10-28 — End: 2016-11-03

## 2014-10-28 MED ORDER — KETOROLAC TROMETHAMINE 30 MG/ML IJ SOLN
30.0000 mg | Freq: Once | INTRAMUSCULAR | Status: AC
Start: 2014-10-28 — End: 2014-10-28
  Administered 2014-10-28: 30 mg via INTRAVENOUS

## 2014-10-28 MED ORDER — AMOXICILLIN 500 MG PO CAPS
500.0000 mg | ORAL_CAPSULE | Freq: Three times a day (TID) | ORAL | Status: AC
Start: 2014-10-28 — End: 2014-11-04

## 2014-10-28 MED ORDER — VH HYDROMORPHONE HCL 1 MG/ML (NARRATOR)
INTRAMUSCULAR | Status: AC
Start: 2014-10-28 — End: ?
  Filled 2014-10-28: qty 1

## 2014-10-28 MED ORDER — METHYLPREDNISOLONE SODIUM SUCC 125 MG IJ SOLR
125.0000 mg | Freq: Once | INTRAMUSCULAR | Status: AC
Start: 2014-10-28 — End: 2014-10-28
  Administered 2014-10-28: 125 mg via INTRAVENOUS

## 2014-10-28 NOTE — ED Notes (Signed)
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

## 2014-10-28 NOTE — ED Provider Notes (Signed)
[facility] EMERGENCY DEPARTMENT History and Physical Exam      Patient Name: [name], [name]  Encounter Date:  10/28/2014  Attending Physician: Hall Busing Cordney Barstow, MD  PCP: Vivia Ewing, NP  Patient DOB:  January 14, 1986  MRN:  [id number]  Room:  N7/N7-A      History of Presenting Illness     Chief complaint: Diarrhea    HPI/ROS is limited by: none  HPI/ROS given by: patient     Location: GI  Duration: 3 weeks  Severity: moderate    [name] is a 28 y.o. female who presents with diarrhea. History of Crohn's. She was on Remicade and recently switched to a new medication but has yet to start taking these. She has not had a flare up in years. She c/o abdominal pain, nausea, and a fever. She was recently diagnosed with tonsillitis and is currently on Keflex.     Review of Systems     Review of Systems   Constitutional: Positive for fever. Negative for chills.   HENT: Negative.    Respiratory: Negative for cough, chest tightness and shortness of breath.    Cardiovascular: Negative for chest pain.   Gastrointestinal: Positive for nausea, abdominal pain and diarrhea. Negative for vomiting.   Genitourinary: Negative.    Musculoskeletal: Negative for myalgias, back pain, arthralgias and gait problem.   Skin: Negative for wound.   Neurological: Negative for weakness and numbness.   Psychiatric/Behavioral: The patient is not nervous/anxious.          Allergies     Pt is allergic to bactrim and sulfa antibiotics.    Medications     Current Outpatient Rx   Name  Route  Sig  Dispense  Refill   . ALPRAZolam (XANAX PO)    Oral    Take 0.5 mg by mouth as needed.                . clonazePAM (KLONOPIN) 1 MG tablet    Oral    Take 1 mg by mouth daily.             . Dicyclomine HCl (BENTYL PO)    Oral    Take by mouth as needed.             . fish oil-omega-3 fatty acids 1000 MG capsule    Oral    Take 2 g by mouth daily.             . Ibuprofen (MOTRIN PO)    Oral    Take by mouth as needed.                Marland Kitchen levothyroxine (SYNTHROID,  LEVOTHROID) 88 MCG tablet    Oral    Take 88 mcg by mouth daily.             . Multiple Vitamins-Minerals (HAIR/SKIN/NAILS) Tab        Take 1 Tab by Mouth Once a Day. Indications: Vitamin Deficiency             . norelgestromin-ethinyl estradiol (ORTHO EVRA) 150-35 MCG/24HR    Transdermal    Place 1 patch onto the skin once a week.             . Zolpidem Tartrate (AMBIEN PO)    Oral    Take 10 mg by mouth nightly as needed.                Marland Kitchen DISCONTD: cephALEXin (KEFLEX) 500 MG  capsule    Oral    Take 500 mg by mouth 4 (four) times daily.             Marland Kitchen amoxicillin (AMOXIL) 500 MG capsule    Oral    Take 1 capsule (500 mg total) by mouth 3 (three) times daily.    21 capsule    0     . budesonide (ENTOCORT EC) 3 MG 24 hr capsule    Oral    Take 2 capsules (6 mg total) by mouth every morning.    30 capsule    0     . HYDROcodone-acetaminophen (NORCO) 5-325 MG per tablet    Oral    Take 1-2 tablets by mouth every 6 (six) hours as needed for Pain.    20 tablet    0     . ondansetron (ZOFRAN ODT) 4 MG disintegrating tablet    Oral    Take 1 tablet (4 mg total) by mouth every 8 (eight) hours as needed for Nausea.    20 tablet    0     . ondansetron (ZOFRAN ODT) 4 MG disintegrating tablet    Oral    Take 1 tablet (4 mg total) by mouth every 8 (eight) hours as needed for Nausea.    20 tablet    0     . oxyCODONE-acetaminophen (PERCOCET) 5-325 MG per tablet        1-2 tablets by mouth every 4-6 hours as needed for pain;  Do not drive or operate machinery while taking this medicine    15 tablet    0     . DISCONTD: budesonide (ENTOCORT EC) 3 MG 24 hr capsule    Oral    Take 6 mg by mouth.                  Past Medical History     Pt has a past medical history of Crohn's disease; H/O Hashimoto thyroiditis; Anxiety; Abortion; and Tachycardia.    Past Surgical History     Pt has past surgical history that includes Breast Implant; Induced abortion; and D & C, SUCTION (N/A, 08/24/2014).    Family History     The family history is not  on file.    Social History     Pt reports that she has never smoked. She has never used smokeless tobacco. She reports that she drinks alcohol. She reports that she does not use illicit drugs.    Physical Exam     Blood pressure 117/84, pulse 98, temperature 98.1 F (36.7 C), temperature source Oral, resp. rate 18, height 1.549 m, weight 41.4 kg, last menstrual period 10/16/2014, SpO2 99 %, currently breastfeeding.    Physical Exam   Constitutional: She is oriented to person, place, and time. She appears well-developed and well-nourished. No distress.   HENT:   Head: Normocephalic and atraumatic.   Mouth/Throat: Oropharynx is clear and moist. Mucous membranes are dry. No oropharyngeal exudate or posterior oropharyngeal erythema.   Tonsils are enlarged.   Eyes: Conjunctivae are normal. Pupils are equal, round, and reactive to light.   Neck: Normal range of motion. Neck supple.   Cardiovascular: Normal rate, regular rhythm and normal heart sounds.  Exam reveals no gallop and no friction rub.    No murmur heard.  Pulmonary/Chest: Effort normal and breath sounds normal. She has no wheezes. She has no rales.   Abdominal: Soft. Bowel sounds are normal. There is generalized tenderness.  Musculoskeletal: Normal range of motion. She exhibits no tenderness.   Neurological: She is alert and oriented to person, place, and time.   Skin: Skin is warm and dry. No rash noted.   Psychiatric: She has a normal mood and affect.   Nursing note and vitals reviewed.       Orders Placed     Orders Placed This Encounter   Procedures   . CMP   . CBC   . ESR   . Urinalysis with Culture in indicated   . Beta HCG, Qual   . Encourage fluids   . Saline lock IV       Diagnostic Results       The results of the diagnostic studies below have been reviewed by myself:    Labs  Results     Procedure Component Value Units Date/Time    Merlene Morse [[id number]] Collected:  10/28/14 0350    Specimen Information:  Blood / Plasma Updated:  10/28/14 0557      BHCG Qual Negative     CMP [[id number]] Collected:  10/28/14 0350    Specimen Information:  Blood / Plasma Updated:  10/28/14 0550     Sodium 141 mMol/L      Potassium 3.6 mMol/L      Chloride 106 mMol/L      CO2 27.0 mMol/L      CALCIUM 9.7 mg/dL      Glucose 95 mg/dL      Creatinine 5.62 mg/dL      BUN 8 mg/dL      Protein, Total 7.4 gm/dL      Albumin 3.5 gm/dL      Alkaline Phosphatase 118 U/L      ALT 23 U/L      AST (SGOT) 18 U/L      Bilirubin, Total 0.2 mg/dL      Albumin/Globulin Ratio 0.90 Ratio      Anion Gap 11.6 mMol/L      BUN/Creatinine Ratio 11.3 Ratio      EGFR >60 mL/min/1.61m2      Osmolality Calc 279 mOsm/kg      Globulin 3.9 gm/dL     ESR [[id number]]  (Abnormal) Collected:  10/28/14 0350    Specimen Information:  Blood / Blood Updated:  10/28/14 0501     Sed Rate 49 (H) mm/hr     CBC [[id number]]  (Abnormal) Collected:  10/28/14 0350    Specimen Information:  Blood / Blood Updated:  10/28/14 0417     WBC 9.7 K/cmm      RBC 3.96 M/cmm      Hemoglobin 13.2 gm/dL      Hematocrit 13.2 %      MCV 96 fL      MCH 33 pg      MCHC 35 gm/dL      RDW 44.0 %      PLT CT 464 (H) K/cmm      MPV 6.8 fL      NEUTROPHIL % 50.3 %      Lymphocytes 34.4 %      Monocytes 13.4 %      Eosinophils % 1.0 %      Basophils % 0.9 %      Neutrophils Absolute 4.9 K/cmm      Lymphocytes Absolute 3.3 K/cmm      Monocytes Absolute 1.3 K/cmm      Eosinophils Absolute 0.1 K/cmm      BASO Absolute 0.1 K/cmm  Radiologic Studies  Radiology Results (24 Hour)     ** No results found for the last 24 hours. **            MDM / Critical Care     Blood pressure 117/84, pulse 98, temperature 98.1 F (36.7 C), temperature source Oral, resp. rate 18, height 1.549 m, weight 41.4 kg, last menstrual period 10/16/2014, SpO2 99 %, currently breastfeeding.    The patient presents with nausea/vomiting/diarrhea, now improved, and is clinically well appearing without fever or pain.  The patient underwent evaluation and treatment in the ER  and appears improved.  The abdomen is soft and non-tender and the patient is tolerating po well.  Although considered in the differential, at present, clinical suspicion for colitis, C. Diff infection, obstruction, severe electrolyte derangement, or other serious intra-abdominal condition is low.   If performed resulted labs and images were reviewed and discussed with patient and/or family.  Nausea/vomiting/diarrhea precautions have been given, diet recommendations were discussed, and the patient is stable for discharge. The patient is warned to return immediately for worsening symptoms including pain or fever, or any other acute concerns, and to follow-up with PCP as needed and for any pending lab results.  All questions were answered and concerns addressed.    Took PO feels better, vitals normal, several recent cts to hold off for now.    Procedures     None    EKG     None    Diagnosis / Disposition     Clinical Impression  1. Exacerbation of Crohn's disease, unspecified complication    2. Diarrhea    3. Medication side effect, initial encounter        Disposition  ED Disposition     Discharge Sherre Lain discharge to home/self care.    Condition at disposition: Stable            Prescriptions  New Prescriptions    AMOXICILLIN (AMOXIL) 500 MG CAPSULE    Take 1 capsule (500 mg total) by mouth 3 (three) times daily.    BUDESONIDE (ENTOCORT EC) 3 MG 24 HR CAPSULE    Take 2 capsules (6 mg total) by mouth every morning.    HYDROCODONE-ACETAMINOPHEN (NORCO) 5-325 MG PER TABLET    Take 1-2 tablets by mouth every 6 (six) hours as needed for Pain.    ONDANSETRON (ZOFRAN ODT) 4 MG DISINTEGRATING TABLET    Take 1 tablet (4 mg total) by mouth every 8 (eight) hours as needed for Nausea.    ONDANSETRON (ZOFRAN ODT) 4 MG DISINTEGRATING TABLET    Take 1 tablet (4 mg total) by mouth every 8 (eight) hours as needed for Nausea.       Attestations     The documentation recorded by my scribe, Laverle Hobby, accurately reflects the  services I personally performed and the decisions made by me.  Hall Busing Kecia Swoboda, MD      Ardice Boyan, Hall Busing, MD  10/28/14 [phone]

## 2014-10-28 NOTE — Discharge Instructions (Signed)
What Is Crohn's Disease?    Crohn's diseasecauses swelling, inflammation,andirritation, which leads to ulceration of the digestive tract. It is a form of inflammatory bowel disease (IBD) and often affects the small intestine and the colon.All layers of the lining of the digestive tract may be affected. While this disease has no cure, the symptoms can be treated. Help manage your symptoms by following your doctor's advice and avoiding foods that cause irritation.  Symptoms of Crohn's disease   Abdominal pain and cramping   Urgent need to move bowels or sensation of incomplete evacuation   High fever and chills   Loss of appetite; possible weight loss   Bloody or persistent diarrhea   Nausea or vomiting   Joint pain   Skin rashes and ulceration   Eye inflammation  Your treatment options  Your doctor will discuss your treatment options with you. They may include medications, lifestyle changes, surgery, or a combination of these. Treatment helps you stay as active as you want to be. Keep in mind that Crohn's is considered chronic. That means it usually can't be cured. But treatment may ease symptoms. And even though you have a chronic illness, you can still live a full life.    Medications  Certain medications can help your symptoms. These may include:   Medications to control your body's immune system, such as 6-mercaptopurine or azathioprine   Corticosteroids (for short-term, but not maintenance treatment) to help reduce inflammation   Antibiotics to fight bacteria, if there are infectious complications   Biologics (anti-TNF)  Lifestyle changes   Certain foods can worsen symptoms. You may need to change what you eat. Avoid any food that makes your symptoms worse. These foods vary from person to person. But high-fiber foods (such as fresh vegetables) and high-fat foods (such as dairy products and red meat) cause symptoms in many people. Keep track of foods that cause you problems.   To a lesser  degree, stress may possibly worsen symptoms. Reducing stress may help. Techniques like relaxation exercises, meditation, and deep breathing can help you control stress. Your health care provider may be able to tell you more about these.  If surgery is needed  Surgery may help control Crohn's, relieving digestive tract symptoms. Surgery can remove a severely affected part of the digestive tract. If this is an option for you, your doctor can give you more information. Keep in mind that surgery is not a cure for Crohn's. You will need to continue to closely follow up with your doctor after surgery for further treatment and testing recommendations.   [address] [employer] [address], [place], [place] [address]. All rights reserved. This information is not intended as a substitute for professional medical care. Always follow your healthcare professional's instructions.    Crohn's Disease    Crohn's disease is a chronic inflammation of the intestinal tract that comes and goes. Crohn's is a form of inflammatory bowel disease (IBD). The exact cause is not known. Chronic diarrhea may alternate with constipation. During a symptom flare, there may be intense abdominal pain and fever. Mucus, blood, or pus may appear in the stool. This is a chronic illness and episodes of inflammation come and go over time. When the disease is not active, there are usually no symptoms.  No one knows what exactly causes IBD. The goal is to control and relieve symptoms, and prevent complications, so you can lead a full and active life. Although there is no cure for IBD, and  no single treatment works for everyone, there are many things that can be done to help.  Symptoms of Crohn's disease may include:   Abdominal cramps and pain   Diarrhea, usually bloody   Mucus stools   Rectal bleeding   Rectal pain   Fever   Low energy   Decreased appetite and weight loss  Diet  Your diet did not cause your Crohn's, but it can affect  it. Unfortunately, there is no one diet that works for everyone, so you have to experiment. Below are some recommendations, but may be different for you. Keep a food log to figure out what you are sensitive to.   Eat more slowly and smaller amounts at a time, but more often. Remember, you can always eat more, but cannot eat less once you've eaten too much.   High fiber foods are complicated. While they may help constipation they can make the bloating, cramping, gas, and diarrhea worse.   Try avoiding dairy products, sometimes this helps   Try cutting out foods that are high in fat and fatty meats   Eat less sugar   Bloating or passing excess gas may be controlled. Be careful with "gassy" vegetables and fruits like beans, cabbage, broccoli, and cauliflower.   Be careful of carbonated beverages and fruit juices. They can make the bloating and diarrhea worse.   Caffeine, alcohol, and stimulants may worsen symptoms. These include coffee, tea, sodas, energy drinks, and chocolate.  Lifestyle  Although stress does not cause IBD, it is a factor in flare-ups, and how you feel and react to your condition.   Look for factors that seem to worsen your symptoms such as stress and emotions.   Counseling can often help relieve stress. So canself-help measures such as exercise, yoga, and meditation.   Depression can be a part of this illness and antidepressants may be prescribed. This may actually help with diarrhea, constipation, and cramping, as well as depression.   Smoking doesn't cause Crohn's, but can make the symptoms worse.  Medicines  Your health care provider may prescribe medicines. If so, take them as directed. For acute flare-ups, prescription medicines can be prescribed. Contact your provider if you need this.   Ask your health care provider before taking any antidiarrheal medicines.   Avoid anti-inflammatory medicines like ibuprofen or naproxen.   Consider nutritional supplements. This is especially  true if the diarrhea is prolonged, or you aren't eating or are losing weight.  Follow-up care  Follow up with your health care provider, oras advised.If a stool sample was taken, or cultures were done, you will be toldif they are positive, or if your treatment needs to be changed.You can call as directedfor the results.  Support groups for persons Crohn's disease can be a source of useful information on how others are coping with this illness. They are available in person, on the phone, or via the Internet. Contact the following resources for more information.   Crohn's [employer] Colitis [employer] [facility] [place], [employer]. [phone] www.KaraokeExchange.cz   National Digestive Diseases Information Clearinghouse (NDDIC) 682 460 5563 www.digestive.StageSync.si  Call 911  Call 911 if any of these occur:   Trouble breathing   Confusion   Very drowsy or trouble awakening   Fainting or loss of consciousness   Rapid heart rate  When to seek medical advice  Call your health care provider right awayif any of these occur:   Fever of 100.21F(38C) or higher, or as directed by your health care provider  Abdominal pain that doesn't get better when you take the usual measures   Mucus, pus, or blood in the stool (dark or bright red)   Repeated vomiting   Abdominal swelling and pain that doesn't go away after a few hours   2000-2015 [employer] [address] [place], [place] [address]. All rights reserved. This information is not intended as a substitute for professional medical care. Always follow your healthcare professional's instructions.          Lifestyle Management of Crohn's Disease  You can lead a full life even if you have Crohn's disease. Focus on keeping your symptoms under control.Do not let this disease isolate you. By planning ahead and working with support groups, you can find ways to cope, and you may even help others who have Crohn's disease.     Have a Plan  Make this your goal: "Crohn's disease  won't keep me from the activities I enjoy." You may need to do some planning to reach that goal. But by staying positive, you can help make sure you're in control -- not the disease. Here are some other tips:   Know where to find clean bathrooms.   Eat more small meals instead of3 big meals, especially when on the road or when you don't have easy access to bathrooms.   If you've had a recent flare-up, eat foods that you know will limit your symptoms. Keep those foods on hand, both at home and at work.   Get some exercise every day.   Take a stress reduction class.   If going on a long trip, discuss your plans with your health care provider. He or she can teach you what to do if you have a flare-up while on the road.    Find a Support Group  Crohn's disease support groups can help you with many concerns you may have. Other people have felt much of what you may be feeling. Just knowing that you're not alone can be a great comfort. Someone in a support group may offer a travel tip or a coping skill that's perfect for you, and don't forget how satisfying it can feel to help another Crohn's disease patient who's in need.  Contact the Crohn's and Colitis Foundation toll-free at [phone] more information.  Managing Nutrition  You may be able to eat most foods until you have a flare-up. But like anyone else, you need to make healthy eating choices. Some of the healthiest foods can make symptoms worse, though. Keeping track of your "problem foods" may be helpful. Ask yourhealth care providerany questions you have about healthy eating.  Avoid Your Problem Foods  There's no rule for which foods can be a problem. How you feel after eating them is the best guide. You may need to avoid high-fiber foods and foods that are hard to digest. These can include fresh fruits and vegetables. High-fat foods, such as whole-milk dairy products and red meat, also can worsen symptoms in a flare-up. Write down what you eat and  how it affects you. If one kind of food often gives you trouble, stay away from it. Also note the foods that work well for you. Yourhealth care providermay have you see a registered dietitian to come up with the best food choices for you. A registered dietitian can help ensure that you eat foods that are "safe" while getting proper nourishment.  Foods That Are Often "Safe"  No two people respond the same to all  foods. But these choices are often "safe" to eat during a flare-up:      2000-2015 [employer] [address] [place], [place] [address]. All rights reserved. This information is not intended as a substitute for professional medical care. Always follow your healthcare professional's instructions.          Crohn's Disease    Crohn's disease is a chronic inflammation of the intestinal tract that comes and goes. Crohn's is a form of inflammatory bowel disease (IBD). The exact cause is not known. Chronic diarrhea may alternate with constipation. During a symptom flare, there may be intense abdominal pain and fever. Mucus, blood, or pus may appear in the stool. This is a chronic illness and episodes of inflammation come and go over time. When the disease is not active, there are usually no symptoms.  No one knows what exactly causes IBD. The goal is to control and relieve symptoms, and prevent complications, so you can lead a full and active life. Although there is no cure for IBD, and no single treatment works for everyone, there are many things that can be done to help.  Symptoms of Crohn's disease may include:   Abdominal cramps and pain   Diarrhea, usually bloody   Mucus stools   Rectal bleeding   Rectal pain   Fever   Low energy   Decreased appetite and weight loss  Diet  Your diet did not cause your Crohn's, but it can affect it. Unfortunately, there is no one diet that works for everyone, so you have to experiment. Below are some recommendations, but may be different for you. Keep  a food log to figure out what you are sensitive to.   Eat more slowly and smaller amounts at a time, but more often. Remember, you can always eat more, but cannot eat less once you've eaten too much.   High fiber foods are complicated. While they may help constipation they can make the bloating, cramping, gas, and diarrhea worse.   Try avoiding dairy products, sometimes this helps   Try cutting out foods that are high in fat and fatty meats   Eat less sugar   Bloating or passing excess gas may be controlled. Be careful with "gassy" vegetables and fruits like beans, cabbage, broccoli, and cauliflower.   Be careful of carbonated beverages and fruit juices. They can make the bloating and diarrhea worse.   Caffeine, alcohol, and stimulants may worsen symptoms. These include coffee, tea, sodas, energy drinks, and chocolate.  Lifestyle  Although stress does not cause IBD, it is a factor in flare-ups, and how you feel and react to your condition.   Look for factors that seem to worsen your symptoms such as stress and emotions.   Counseling can often help relieve stress. So canself-help measures such as exercise, yoga, and meditation.   Depression can be a part of this illness and antidepressants may be prescribed. This may actually help with diarrhea, constipation, and cramping, as well as depression.   Smoking doesn't cause Crohn's, but can make the symptoms worse.  Medicines  Your health care provider may prescribe medicines. If so, take them as directed. For acute flare-ups, prescription medicines can be prescribed. Contact your provider if you need this.   Ask your health care provider before taking any antidiarrheal medicines.   Avoid anti-inflammatory medicines like ibuprofen or naproxen.   Consider nutritional supplements. This is especially true if the diarrhea is prolonged, or you aren't eating or  are losing weight.  Follow-up care  Follow up with your health care provider, oras advised.If a  stool sample was taken, or cultures were done, you will be toldif they are positive, or if your treatment needs to be changed.You can call as directedfor the results.  Support groups for persons Crohn's disease can be a source of useful information on how others are coping with this illness. They are available in person, on the phone, or via the Internet. Contact the following resources for more information.   Crohn's [facility] Colitis [employer] [facility] [place], [employer]. ([phone] www.KaraokeExchange.cz   National Digestive Diseases Information Clearinghouse (NDDIC) 3377536281 www.digestive.StageSync.si  Call 911  Call 911 if any of these occur:   Trouble breathing   Confusion   Very drowsy or trouble awakening   Fainting or loss of consciousness   Rapid heart rate  When to seek medical advice  Call your health care provider right awayif any of these occur:   Fever of 100.70F(38C) or higher, or as directed by your health care provider   Abdominal pain that doesn't get better when you take the usual measures   Mucus, pus, or blood in the stool (dark or bright red)   Repeated vomiting   Abdominal swelling and pain that doesn't go away after a few hours   2000-2015 [employer] [address], [place], [place] [address]. All rights reserved. This information is not intended as a substitute for professional medical care. Always follow your healthcare professional's instructions.          Management of Crohn's Disease: Medications  Your doctor may prescribe medication to control your Crohn's disease symptoms and improve your quality of life. Medication won't cure Crohn's disease, but it can help keep the disease from slowing you down. As always, work closely with your doctor. Your medication or dosage may need to be changed if you have certain side effects or if your symptoms change  Corticosteroids  Your doctor may advise you to take corticosteroids. These help to calm inflammation in your body. This can  make your symptoms better quickly. You may take corticosteroids as a pill, or liquid by mouth. In some cases, they may be given through an IV or given rectally as either a suppository or an enema. You take them for a shorttime, usually not longer than 8 to 12 weeks.You do not take themwhen you are in remission. Remission is a long periodwith no symptoms.  If used for a long time, side effects may include:   Mood changes   Trouble sleeping   Changes in body shape   Puffy face or acne   Weight gain   Stretch marks   Eye problems   Bone loss or breaks   Facial hair in women   High blood pressure   Risk of diabetes  Immunomodulators  These kinds of medications causeyour body's immune system to be less active. This can help to reduce inflammation and calm your symptoms. They are taken as a pill, by mouth. You may not feel their effects until you have taken them for a few months. Butyou can take themfor a long time. You will need to have blood tests every few months to check your liver and blood cell counts.  Side effects may include:   Nausea   Body aches   Inflammation of the pancreas   Low white blood cell count   Liver problems   Low folic acid levels   Infection   Lymphoma  Non-melanoma skin cancer  Biologic agents  These kinds of medications help to stop a chemicalthat your body makes, which causes inflammation. The chemical is called tumor necrosis factor (TNF). The medications are also known as anti-TNF monoclonal antibodies.The wayyou take abiologic agent depends onhow thedrug is given. It may be given through an IV every 2 to 8 weeks. It may be given through an injection (shot) as often as once a week. Or it may be given as a shot once a month. These drugs can put you at riskfor infections, so tell your doctor if you have a chronic infection. Your doctor may also test youfor tuberculosis and hepatitis B infection before giving youthe medication.  Side effects may  include:   Flushing, chest pain, shortness of breath, hives, or a drop in blood pressure during IV treatment   Joint and muscle aches   Rash   Fever   Infection (including reactivation of the tuberculosis and hepatitis B)   Lymphoma   Skin cancers   Hepatotoxity   TNF-induced psoriasis  Antibiotics  These may be used if you also have an infection due to Crohn's disease, such as an abscess. Antibiotics may be given as a pilltaken by mouth. You should stay out of the sun while taking them. You should also not drink alcohol while taking them. It may cause severe reactions, such as nausea, vomiting, and breathing problems. Also, tell your doctor right away if you have numbness or tingling in your hands. Also tell your doctor if your bowel symptoms become worse.  Side effects may include:   Nausea   Diarrhea   Headaches   Dizziness   Dark urine   Loss of appetite   Metallic taste in the mouth   Sensitivity to the sun  Handling side effects  You and your doctor will discuss side effects. In most cases, side effects are easy to manage. But sometimes they can become severe enough that you need to change medication. Call your doctor if you're having side effects that trouble you. Also call if you're having any side effects that are unexpected.   [address] [employer] [address] [place], [place] [address]. All rights reserved. This information is not intended as a substitute for professional medical care. Always follow your healthcare professional's instructions.

## 2014-10-28 NOTE — ED Notes (Signed)
Pt resting quietly; when asked, reports "feel a bit better, but still feel dehydrated".  States unable to void at this time;

## 2014-11-10 ENCOUNTER — Emergency Department: Payer: 59

## 2014-11-10 ENCOUNTER — Emergency Department
Admission: EM | Admit: 2014-11-10 | Discharge: 2014-11-10 | Disposition: A | Discharge status: Home / Self Care | Payer: 59 | Attending: Emergency Medical Services | Admitting: Emergency Medical Services

## 2014-11-10 DIAGNOSIS — N390 Urinary tract infection, site not specified: Secondary | ICD-10-CM | POA: Insufficient documentation

## 2014-11-10 DIAGNOSIS — Z9882 Breast implant status: Secondary | ICD-10-CM | POA: Insufficient documentation

## 2014-11-10 DIAGNOSIS — E039 Hypothyroidism, unspecified: Secondary | ICD-10-CM | POA: Insufficient documentation

## 2014-11-10 DIAGNOSIS — K5090 Crohn's disease, unspecified, without complications: Secondary | ICD-10-CM | POA: Insufficient documentation

## 2014-11-10 LAB — CBC AND DIFFERENTIAL
Basophils Absolute Automated: 0.02 10*3/uL (ref 0.00–0.20)
Basophils Automated: 0 %
Eosinophils Absolute Automated: 0.01 10*3/uL (ref 0.00–0.70)
Eosinophils Automated: 0 %
Hematocrit: 36.5 % — ABNORMAL LOW (ref 37.0–47.0)
Hgb: 12.3 g/dL (ref 12.0–16.0)
Immature Granulocytes Absolute: 0.01 10*3/uL
Immature Granulocytes: 0 %
Lymphocytes Absolute Automated: 1.93 10*3/uL (ref 0.50–4.40)
Lymphocytes Automated: 23 %
MCH: 32.5 pg — ABNORMAL HIGH (ref 28.0–32.0)
MCHC: 33.7 g/dL (ref 32.0–36.0)
MCV: 96.3 fL (ref 80.0–100.0)
MPV: 9.6 fL (ref 9.4–12.3)
Monocytes Absolute Automated: 1.19 10*3/uL (ref 0.00–1.20)
Monocytes: 14 %
Neutrophils Absolute: 5.22 10*3/uL (ref 1.80–8.10)
Neutrophils: 62 %
Nucleated RBC: 0 /100 WBC (ref 0–1)
Platelets: 396 10*3/uL (ref 140–400)
RBC: 3.79 10*6/uL — ABNORMAL LOW (ref 4.20–5.40)
RDW: 12 % (ref 12–15)
WBC: 8.37 10*3/uL (ref 3.50–10.80)

## 2014-11-10 LAB — BASIC METABOLIC PANEL
Anion Gap: 9 (ref 5.0–15.0)
BUN: 11 mg/dL (ref 7–19)
CO2: 23 mEq/L (ref 22–29)
Calcium: 9.2 mg/dL (ref 8.5–10.5)
Chloride: 106 mEq/L (ref 100–111)
Creatinine: 0.8 mg/dL (ref 0.6–1.0)
Glucose: 120 mg/dL — ABNORMAL HIGH (ref 70–100)
Potassium: 3.6 mEq/L (ref 3.5–5.1)
Sodium: 138 mEq/L (ref 136–145)

## 2014-11-10 LAB — URINALYSIS, REFLEX TO MICROSCOPIC EXAM IF INDICATED
Bilirubin, UA: NEGATIVE
Blood, UA: NEGATIVE
Glucose, UA: NEGATIVE
Nitrite, UA: NEGATIVE
Protein, UR: NEGATIVE
Specific Gravity UA: 1.017 (ref 1.001–1.035)
Urine pH: 6 (ref 5.0–8.0)
Urobilinogen, UA: NORMAL mg/dL

## 2014-11-10 LAB — GROUP A STREP, RAPID ANTIGEN: Group A Strep, Rapid Antigen: NEGATIVE

## 2014-11-10 LAB — GFR: EGFR: >60

## 2014-11-10 LAB — MONONUCLEOSIS SCREEN: Mono Screen: NEGATIVE

## 2014-11-10 MED ORDER — OXYCODONE-ACETAMINOPHEN 5-325 MG PO TABS
ORAL_TABLET | ORAL | Status: DC
Start: 2014-11-10 — End: 2016-10-23

## 2014-11-10 MED ORDER — CEFDINIR 300 MG PO CAPS
300.0000 mg | ORAL_CAPSULE | Freq: Two times a day (BID) | ORAL | Status: DC
Start: 2014-11-10 — End: 2016-10-24

## 2014-11-10 MED ORDER — SODIUM CHLORIDE 0.9 % IV BOLUS
500.0000 mL | Freq: Once | INTRAVENOUS | Status: AC
Start: 2014-11-10 — End: 2014-11-10
  Administered 2014-11-10: 500 mL via INTRAVENOUS

## 2014-11-10 MED ORDER — ONDANSETRON 4 MG PO TBDP
4.0000 mg | ORAL_TABLET | Freq: Four times a day (QID) | ORAL | Status: AC | PRN
Start: 2014-11-10 — End: 2014-11-17

## 2014-11-10 MED ORDER — HYDROMORPHONE HCL 1 MG/ML IJ SOLN
1.0000 mg | Freq: Once | INTRAMUSCULAR | Status: AC
Start: 2014-11-10 — End: 2014-11-10
  Administered 2014-11-10: 1 mg via INTRAVENOUS
  Filled 2014-11-10: qty 1

## 2014-11-10 MED ORDER — ONDANSETRON HCL 4 MG/2ML IJ SOLN
4.0000 mg | Freq: Once | INTRAMUSCULAR | Status: AC
Start: 2014-11-10 — End: 2014-11-10
  Administered 2014-11-10: 4 mg via INTRAVENOUS
  Filled 2014-11-10: qty 2

## 2014-11-10 MED ORDER — SODIUM CHLORIDE 0.9 % IV MBP
1.0000 g | Freq: Once | INTRAVENOUS | Status: AC
Start: 2014-11-10 — End: 2014-11-10
  Administered 2014-11-10: 1 g via INTRAVENOUS
  Filled 2014-11-10: qty 1000

## 2014-11-10 NOTE — ED Notes (Signed)
Pt states she is have bilateral flank pain. Decreased appetite. +n,difficulty voiding. Denies diarrhea, headache, dizziness. Reports fever for two days. Took motrin 600 mg @ 1515.

## 2014-11-10 NOTE — Discharge Instructions (Signed)
Dear [name]:    I appreciate your choosing the [facility] for your healthcare needs, and hope your visit today was EXCELLENT.    Instructions:  Please follow-up with Allison Lark, NP as soon as possible.    Return to the Emergency Department for any worsening symptoms or concerns.    Below is some information that our patients often find helpful.    We wish you good health and please do not hesitate to contact us if we can ever be of any assistance.    Sincerely,  Thad Ranger, Tiffany Kocher, MD  [facility]    ________________________________________________________________    If you do not continue to improve or your condition worsens, please contact your doctor or return immediately to the Emergency Department.    Thank you for choosing [facility] for your emergency care needs.  We strive to provide EXCELLENT care to you and your family.      DOCTOR REFERRALS  Call [phone] if you need any further referrals and we can help you find a primary care doctor or specialist.  Also, available online at:  [email or web address]    YOUR CONTACT INFORMATION  Before leaving please check with registration to make sure we have an up-to-date contact number.  You can call registration at [phone] to update your information.  For questions about your hospital bill, please call [phone].  For questions about your Emergency Dept Physician bill please call [phone].      FREE HEALTH SERVICES  If you need help with health or social services, please call 2-1-1 for a free referral to resources in your area.  2-1-1 is a free service connecting people with information on health insurance, free clinics, pregnancy, mental health, dental care, food assistance, housing, and substance abuse counseling.  Also, available online at:  http://www.[facility].org    MEDICAL RECORDS AND TESTS  Certain laboratory test results do not  come back the same day, for example urine cultures.   We will contact you if other important findings are noted.  Radiology films are often reviewed again to ensure accuracy.  If there is any discrepancy, we will notify you.      Please call [phone] to pick up a complimentary CD of any radiology studies performed.  If you or your doctor would like to request a copy of your medical records, please call [phone].      ORTHOPEDIC INJURY   Please know that significant injuries can exist even when an initial x-ray is read as normal or negative.  This can occur because some fractures (broken bones) are not initially visible on x-rays.  For this reason, close outpatient follow-up with your primary care doctor or bone specialist (orthopedist) is required.    MEDICATIONS AND FOLLOWUP  Please be aware that some prescription medications can cause drowsiness.  Use caution when driving or operating machinery.    The examination and treatment you have received in our Emergency Department is provided on an emergency basis, and is not intended to be a substitute for your primary care physician.  It is important that your doctor checks you again and that you report any new or remaining problems at that time.      24 H[facility] PHARMACIES  [facility] - [address], [place], [place] [address] (1.4 miles, 7 minutes)  [facility] - [address] [place], [place] [address] (6.5 miles, 13 minutes)  Handout with  directions available on request

## 2014-11-10 NOTE — ED Provider Notes (Signed)
Physician/Midlevel provider first contact with patient: 11/10/14 1921         [facility] EMERGENCY DEPARTMENT HISTORY AND PHYSICAL EXAM    Patient Name: [name], [name]  Encounter Date:  11/10/2014  Rendering Provider: Bonner Puna, MD  Patient DOB:  1986/11/18  MRN:  [id number]    History of Presenting Illness     Historian: Patient    28 y.o. female with h/o Crohn's disease, Hashimoto thyroiditis, anxiety, and abortion p/w persistent, severe, sharp bilateral flank pain since ~2 days ago. Associated with fever since ~2 days ago (tmax = 101.6), decreased appetite, nausea, intermittent abd pain, dysuria, and difficulty voiding urine. Pt took 600 mg Motrin ~5 hours ago with relief. Pt has taken Zofran without significant relief. No dizziness, vomiting, or diarrhea.    PMD:  Schutter, Elenore Paddy, NP    Past Medical History     Past Medical History   Diagnosis Date   . Crohn's disease    . H/O Hashimoto thyroiditis      hypothyroidism   . Anxiety    . Abortion    . Tachycardia        Past Surgical History     Past Surgical History   Procedure Laterality Date   . Breast implant     . Induced abortion     . D & c, suction N/A 08/24/2014     Procedure: D & C, SUCTION;  Surgeon: Maryruth Hancock, MD;  Location: [place] [facility];  Service: Obstetrics;  Laterality: N/A;   . Colonoscopy         Family History     History reviewed. No pertinent family history.    Social History     History     Social History   . Marital Status: Divorced     Spouse Name: N/A     Number of Children: N/A   . Years of Education: N/A     Social History Main Topics   . Smoking status: Never Smoker    . Smokeless tobacco: Never Used   . Alcohol Use: Yes      Comment: rare   . Drug Use: No   . Sexual Activity: Not on file     Other Topics Concern   . Not on file     Social History Narrative       Home Medications     Home medications reviewed by ED MD     Discharge Medication List as of 11/10/2014 10:45 PM      CONTINUE these  medications which have NOT CHANGED    Details   Certolizumab Pegol (CIMZIA SC) Inject into the skin., Until Discontinued, Historical Med      ALPRAZolam (XANAX PO) Take 0.5 mg by mouth as needed.   , Until Discontinued, Historical Med      budesonide (ENTOCORT EC) 3 MG 24 hr capsule Take 2 capsules (6 mg total) by mouth every morning., Starting 10/28/2014, Until Discontinued, Print      clonazePAM (KLONOPIN) 1 MG tablet Take 1 mg by mouth daily., Until Discontinued, Historical Med      Dicyclomine HCl (BENTYL PO) Take by mouth as needed., Until Discontinued, Historical Med      fish oil-omega-3 fatty acids 1000 MG capsule Take 2 g by mouth daily., Until Discontinued, Historical Med      HYDROcodone-acetaminophen (NORCO) 5-325 MG per tablet Take 1-2 tablets by mouth every 6 (six) hours as needed for Pain., Starting 10/28/2014,  Until Discontinued, Print      Ibuprofen (MOTRIN PO) Take by mouth as needed.   , Until Discontinued, Historical Med      levothyroxine (SYNTHROID, LEVOTHROID) 88 MCG tablet Take 88 mcg by mouth daily., Until Discontinued, Historical Med      Multiple Vitamins-Minerals (HAIR/SKIN/NAILS) Tab Take 1 Tab by Mouth Once a Day. Indications: Vitamin Deficiency, Until Discontinued, Historical Med      norelgestromin-ethinyl estradiol (ORTHO EVRA) 150-35 MCG/24HR Place 1 patch onto the skin once a week., Until Discontinued, Historical Med      !! ondansetron (ZOFRAN ODT) 4 MG disintegrating tablet Take 1 tablet (4 mg total) by mouth every 8 (eight) hours as needed for Nausea., Starting 10/28/2014, Until Discontinued, Normal      !! ondansetron (ZOFRAN ODT) 4 MG disintegrating tablet Take 1 tablet (4 mg total) by mouth every 8 (eight) hours as needed for Nausea., Starting 10/28/2014, Until Discontinued, Print      !! oxyCODONE-acetaminophen (PERCOCET) 5-325 MG per tablet 1-2 tablets by mouth every 4-6 hours as needed for pain;  Do not drive or operate machinery while taking this medicine, Print       Zolpidem Tartrate (AMBIEN PO) Take 10 mg by mouth nightly as needed.   , Until Discontinued, Historical Med       !! - Potential duplicate medications found. Please discuss with provider.          Review of Systems     Constitutional:  +Fever, +Decreased appetite   GI: +Abd pain, +Nausea, No vomiting, No diarrhea  GU: +Flank pain (bilateral), +Difficulty voiding urine, +Dysuria:   Neuro:  No dizziness  All other systems reviewed and negative    Physical Exam     BP 104/64 mmHg  Pulse 70  Temp(Src) 98.1 F (36.7 C) (Oral)  Resp 16  Ht 1.549 m  Wt 40.37 kg  BMI 16.83 kg/m2  SpO2 98%  LMP 10/16/2014    CONSTITUTIONAL: Vital signs reviewed, Well appearing, Alert and oriented X 3.   HEAD: Atraumatic, Normocephalic.   EYES: Eyes are normal to inspection, Pupils equal, round and reactive to light, Sclera are normal, Conjunctiva are normal.   ENT: Nose examination normal, Posterior pharynx normal, Mildly dry mucous membranes.   NECK: Normal ROM.   RESPIRATORY CHEST: Chest is nontender, Breath sounds normal, No respiratory distress.   CARDIOVASCULAR: RRR, No murmurs.   ABDOMEN: Mild diffuse abdominal tenderness, Bowel sounds normal, No distension, No peritoneal signs.   BACK: Bilateral CVA tenderness   NEURO: No focal motor deficits, No focal sensory deficits, Speech normal.   SKIN: Skin is warm, Skin is dry, Skin is normal color.   PSYCHIATRIC: Oriented X 3, Normal affect, Normal insight.     ED Medications Administered     ED Medication Orders     Start     Status Ordering Provider    11/10/14 2250  ondansetron [facility]) injection 4 mg   Once     Route: Intravenous  Ordered Dose: 4 mg     Last MAR action:  Given Iasiah Ozment SELF    11/10/14 2108  cefTRIAXone (ROCEPHIN) 1 g in sodium chloride 0.9 % 100 mL IVPB mini-bag plus   Once     Route: Intravenous  Ordered Dose: 1 g     Last MAR action:  Stopped Keslyn Teater SELF    11/10/14 2106  HYDROmorphone (DILAUDID) injection 1 mg   Once     Route: Intravenous   Ordered Dose: 1  mg     Last MAR action:  Given Adalyn Pennock SELF    11/10/14 2106  ondansetron (ZOFRAN) injection 4 mg   Once     Route: Intravenous  Ordered Dose: 4 mg     Last MAR action:  Given Berlynn Warsame SELF    11/10/14 1923  sodium chloride 0.9 % bolus 500 mL   Once     Route: Intravenous  Ordered Dose: 500 mL     Last MAR action:  Stopped Cicily Bonano SELF          Orders Placed During This Encounter     Orders Placed This Encounter   Procedures   . Urine culture   . Rapid Strep (Group A Antigen)   . Culture, Throat (GROUP A STREP)   . Basic Metabolic Panel   . CBC and differential   . UA, Reflex to Microscopic   . GFR   . Mononucleosis screen   . TSH   . Diet NPO effective now   . Saline lock IV       Diagnostic Study Results     The results of the diagnostic studies below were reviewed by the ED provider:    Labs  Results     Procedure Component Value Units Date/Time    Mononucleosis screen [[id number]] Collected:  11/10/14 1938     Mono Screen Negative Updated:  11/10/14 2225    UA, Reflex to Microscopic [[id number]]  (Abnormal) Collected:  11/10/14 1938    Specimen Information:  Urine Updated:  11/10/14 2217     Urine Type Clean Catch      Color, UA Yellow      Clarity, UA Hazy      Specific Gravity UA 1.017      Urine pH 6.0      Leukocyte Esterase, UA Trace (A)      Nitrite, UA Negative      Protein, UR Negative      Glucose, UA Negative      Ketones UA Trace (A)      Urobilinogen, UA Normal mg/dL      Bilirubin, UA Negative      Blood, UA Negative      RBC, UA 0 - 5 /hpf      WBC, UA 11 - 25 (A) /hpf      Squamous Epithelial Cells, Urine 0 - 5 /hpf      Trans Epithel, UA 0 - 2 /hpf      Urine Mucus Present     Urine culture [[id number]] Collected:  11/10/14 1938    Specimen Information:  Urine / Urine, Clean Catch Updated:  11/10/14 2214    Rapid Strep (Group A Antigen) [[id number]] Collected:  11/10/14 2128    Specimen Information:  Throat Updated:  11/10/14 2144     Group A Strep, Rapid  Antigen Negative     Basic Metabolic Panel [[id number]]  (Abnormal) Collected:  11/10/14 1938    Specimen Information:  Blood Updated:  11/10/14 1959     Glucose 120 (H) mg/dL      BUN 11 mg/dL      Creatinine 0.8 mg/dL      CALCIUM 9.2 mg/dL      Sodium 295 mEq/L      Potassium 3.6 mEq/L      Chloride 106 mEq/L      CO2 23 mEq/L      Anion Gap 9.0     GFR [[id number]] Collected:  11/10/14 1938  EGFR >60.0 Updated:  11/10/14 1959    CBC and differential [[id number]]  (Abnormal) Collected:  11/10/14 1938    Specimen Information:  Blood / Blood Updated:  11/10/14 1946     WBC 8.37 x10 3/uL      RBC 3.79 (L) x10 6/uL      Hgb 12.3 g/dL      Hematocrit 40.9 (L) %      MCV 96.3 fL      MCH 32.5 (H) pg      MCHC 33.7 g/dL      RDW 12 %      Platelets 396 x10 3/uL      MPV 9.6 fL      Neutrophils 62 %      Lymphocytes Automated 23 %      Monocytes 14 %      Eosinophils Automated 0 %      Basophils Automated 0 %      Immature Granulocyte 0 %      Nucleated RBC 0 /100 WBC      Neutrophils Absolute 5.22 x10 3/uL      Abs Lymph Automated 1.93 x10 3/uL      Abs Mono Automated 1.19 x10 3/uL      Abs Eos Automated 0.01 x10 3/uL      Absolute Baso Automated 0.02 x10 3/uL      Absolute Immature Granulocyte 0.01 x10 3/uL           Radiologic Studies  Radiology Results (24 Hour)     ** No results found for the last 24 hours. **          Scribe and MD Attestations     I, Bonner Puna, MD, personally performed the services documented. Gasper Sells is scribing for me on Levels,Saami[employer] MARIE. I reviewed and confirm the accuracy of the information in this medical record.    I, Gasper Sells, am serving as a Neurosurgeon to document services personally performed by Bonner Puna, MD, based on the provider's statements to me.     Rendering Provider: Bonner Puna, MD    Monitors, EKG, Critical Care, and Splints     EKG (interpreted by ED physician):   Cardiac Monitor (interpreted by ED physician):     Critical Care:    Splint check:      MDM and Clinical Notes       Notes:    @2220 : Patient is feeling better. She was tolerating PO. Strep test is negative, will add a mono. Plan to discharge on antibiotics to cover UTI. Of note, patient had a low TSH found a 0.51, I advised her to hold her synthroid and to meet with her PMD to adjust her dose, as it may be impacting her GI symptoms. Repeat TSH pending.    Consults:    Diagnosis and Disposition     Clinical Impression  1. Urinary tract infection, site unspecified        Disposition  ED Disposition     Discharge Oluwatoni Rethel Sebek discharge to home/self care.    Condition at disposition: Stable            Prescriptions       Discharge Medication List as of 11/10/2014 10:45 PM      START taking these medications    Details   cefdinir (OMNICEF) 300 MG capsule Take 1 capsule (300 mg total) by mouth 2 (two) times daily., Starting 11/10/2014, Until Discontinued, Print      !!  ondansetron (ZOFRAN ODT) 4 MG disintegrating tablet Take 1 tablet (4 mg total) by mouth every 6 (six) hours as needed for Nausea., Starting 11/10/2014, Until Fri 11/17/14, Print      !! oxyCODONE-acetaminophen (PERCOCET) 5-325 MG per tablet 1-2 tablets by mouth every 4-6 hours as needed for pain;  Do not drive or operate machinery while taking this medicine, Print       !! - Potential duplicate medications found. Please discuss with provider.                    Lorenza Burton Self, MD  11/11/14 2350

## 2014-11-11 LAB — TSH: TSH: 0.53 u[IU]/mL (ref 0.35–4.94)

## 2015-02-25 ENCOUNTER — Emergency Department
Admission: EM | Admit: 2015-02-25 | Discharge: 2015-02-25 | Disposition: A | Discharge status: Home / Self Care | Payer: 59 | Attending: Emergency Medicine | Admitting: Emergency Medicine

## 2015-02-25 ENCOUNTER — Emergency Department: Payer: 59

## 2015-02-25 DIAGNOSIS — N390 Urinary tract infection, site not specified: Secondary | ICD-10-CM | POA: Insufficient documentation

## 2015-02-25 DIAGNOSIS — R1011 Right upper quadrant pain: Secondary | ICD-10-CM | POA: Insufficient documentation

## 2015-02-25 DIAGNOSIS — B349 Viral infection, unspecified: Secondary | ICD-10-CM | POA: Insufficient documentation

## 2015-02-25 DIAGNOSIS — J40 Bronchitis, not specified as acute or chronic: Secondary | ICD-10-CM | POA: Insufficient documentation

## 2015-02-25 LAB — URINALYSIS
Bilirubin, UA: NEGATIVE
Blood, UA: NEGATIVE
Glucose, UA: NEGATIVE mg/dL
Ketones UA: 5 mg/dL
Leukocyte Esterase, UA: 25 Leu/uL — AB
Nitrite, UA: NEGATIVE
Protein, UR: 30 mg/dL — AB
RBC, UA: 2 /hpf (ref 0–5)
Squam Epithel, UA: 45 /hpf — ABNORMAL HIGH (ref 0–2)
Urine Specific Gravity: 1.023 (ref 1.001–1.040)
Urobilinogen, UA: NORMAL mg/dL
WBC, UA: 14 /hpf — ABNORMAL HIGH (ref 0–4)
pH, Urine: 6 pH (ref 5.0–8.0)

## 2015-02-25 LAB — CBC AND DIFFERENTIAL
Basophils %: 0.5 % (ref 0.0–3.0)
Basophils Absolute: 0.1 10*3/uL (ref 0.0–0.3)
Eosinophils %: 0.7 % (ref 0.0–7.0)
Eosinophils Absolute: 0.1 10*3/uL (ref 0.0–0.8)
Hematocrit: 43.6 % (ref 36.0–48.0)
Hemoglobin: 14 gm/dL (ref 12.0–16.0)
Lymphocytes Absolute: 2.8 10*3/uL (ref 0.6–5.1)
Lymphocytes: 22.5 % (ref 15.0–46.0)
MCH: 32 pg (ref 28–35)
MCHC: 32 gm/dL (ref 32–36)
MCV: 99 fL (ref 80–100)
MPV: 6 fL (ref 6.0–10.0)
Monocytes Absolute: 1.4 10*3/uL (ref 0.1–1.7)
Monocytes: 11.2 % (ref 3.0–15.0)
Neutrophils %: 65 % (ref 42.0–78.0)
Neutrophils Absolute: 8.2 10*3/uL (ref 1.7–8.6)
PLT CT: 489 10*3/uL — ABNORMAL HIGH (ref 130–440)
RBC: 4.41 10*6/uL (ref 3.80–5.00)
RDW: 11.2 % (ref 11.0–14.0)
WBC: 12.6 10*3/uL — ABNORMAL HIGH (ref 4.0–11.0)

## 2015-02-25 LAB — BASIC METABOLIC PANEL
Anion Gap: 14.1 mMol/L (ref 7.0–18.0)
BUN / Creatinine Ratio: 15.4 Ratio (ref 10.0–30.0)
BUN: 12 mg/dL (ref 7–22)
CO2: 24.9 mMol/L (ref 20.0–30.0)
Calcium: 9.6 mg/dL (ref 8.5–10.5)
Chloride: 104 mMol/L (ref 98–110)
Creatinine: 0.78 mg/dL (ref 0.60–1.20)
EGFR: >60 mL/min/{1.73_m2}
Glucose: 90 mg/dL (ref 70–99)
Osmolality Calc: 277 mOsm/kg (ref 275–300)
Potassium: 4 mMol/L (ref 3.5–5.3)
Sodium: 139 mMol/L (ref 136–147)

## 2015-02-25 LAB — URINE HCG QUALITATIVE: Urine HCG Qualitative: NEGATIVE

## 2015-02-25 MED ORDER — LEVOFLOXACIN 500 MG PO TABS
500.0000 mg | ORAL_TABLET | Freq: Every day | ORAL | Status: AC
Start: 2015-02-25 — End: 2015-03-02

## 2015-02-25 MED ORDER — HYDROCOD POLST-CPM POLST ER 10-8 MG/5ML PO LQCR
5.0000 mL | Freq: Two times a day (BID) | ORAL | Status: DC
Start: 2015-02-25 — End: 2016-10-24

## 2015-02-25 MED ORDER — KETOROLAC TROMETHAMINE 30 MG/ML IJ SOLN
INTRAMUSCULAR | Status: AC
Start: 2015-02-25 — End: ?
  Filled 2015-02-25: qty 1

## 2015-02-25 MED ORDER — ONDANSETRON HCL 4 MG/2ML IJ SOLN
INTRAMUSCULAR | Status: AC
Start: 2015-02-25 — End: ?
  Filled 2015-02-25: qty 2

## 2015-02-25 MED ORDER — SODIUM CHLORIDE 0.9 % IV BOLUS
1000.0000 mL | Freq: Once | INTRAVENOUS | Status: AC
Start: 2015-02-25 — End: 2015-02-25
  Administered 2015-02-25: 1000 mL via INTRAVENOUS

## 2015-02-25 MED ORDER — CEFTRIAXONE SODIUM 1 G IJ SOLR
INTRAMUSCULAR | Status: AC
Start: 2015-02-25 — End: ?
  Filled 2015-02-25: qty 1000

## 2015-02-25 MED ORDER — KETOROLAC TROMETHAMINE 30 MG/ML IJ SOLN
30.0000 mg | Freq: Once | INTRAMUSCULAR | Status: AC
Start: 2015-02-25 — End: 2015-02-25
  Administered 2015-02-25: 30 mg via INTRAVENOUS

## 2015-02-25 MED ORDER — GUAIFENESIN ER 600 MG PO TB12
1200.0000 mg | ORAL_TABLET | Freq: Two times a day (BID) | ORAL | Status: AC
Start: 2015-02-25 — End: 2015-03-02

## 2015-02-25 MED ORDER — ONDANSETRON HCL 4 MG/2ML IJ SOLN
4.0000 mg | Freq: Once | INTRAMUSCULAR | Status: AC
Start: 2015-02-25 — End: 2015-02-25
  Administered 2015-02-25: 4 mg via INTRAVENOUS

## 2015-02-25 MED ORDER — SODIUM CHLORIDE 0.9 % IV MBP
1.0000 g | Freq: Once | INTRAVENOUS | Status: AC
Start: 2015-02-25 — End: 2015-02-25
  Administered 2015-02-25: 1 g via INTRAVENOUS

## 2015-02-25 NOTE — ED Notes (Signed)
Presents today with cough and congestion x 5 days. She went to [facility] yesterday and they advised her to come to the ER. She states she was hesitant to see if she got better. However, she woke this morning with RLQ pain. She reports nausea without vomiting, diarrhea or constipation.

## 2015-02-25 NOTE — Discharge Instructions (Signed)
What is Acute Bronchitis?    Acute or short-term bronchitis last for days or weeks. Itoccurs when the bronchial tubes (airways in the lungs) are irritated by a virus, bacteria, or allergen. This causes a cough that produces yellow or greenish mucus.  Inside healthy lungs  Air travels in and out of the lungs through the airways. The linings of these airways produce sticky mucus. This mucus traps particles that enter the lungs. Tiny structures called cilia then sweep the particles out of the airways.     Lungs with bronchitis  Bronchitis often occurs when a cold or the flu virus. The airways become inflamed (red and swollen).There isa deep "hacking" cough from the extra mucus. Other symptoms may include:   Wheezing   Chest discomfort   Shortness of breath   Mild fever  A second infection, this time due to bacteria, may then occur. And, airways irritated by allergens or smoke are more likely to get infected.     Making a diagnosis  A physical exam, medical history, and certain tests help your health care provider make the diagnosis.  Medicalhistory  Your health care providerwillask youabout your symptoms.  The exam  Your provider listens toyour chest for signs of congestion. He or she may also checkyour ears, nose, and throat.  Possible tests   A sputum testfor bacteria. This requires a sample of mucus from the lungs.   A nasal or throat swabfor bacterial infection.   A chest X-rayif your health care provider suspects pneumonia.   Tests to check for an underlying condition,such as allergies, asthma, or COPD. You may be referred to a specialist for further lung function testing.  Treating a cough  The main treatment for bronchitis is easing symptoms. Avoiding smoke, allergens, and other things that trigger coughing can often help. If the infection is bacterial, antibiotics may be used. During the illness, it's important to get plenty of sleep. To ease symptoms:   Don't smoke, and avoid secondhand  smoke.   Use a humidifier, or breathe in steam from a hot shower. This may help loosen mucus.   Drink a lot of water and juice. They can soothe the throat and may help thin mucus.   Sit up or use extra pillows when in bed to help lessen coughing and congestion.   Ask your providerabout usingcough medicine, pain and fever medication, or a decongestant.  Antibiotics  Most cases of bronchitis are caused by cold or flu viruses. Antibiotics don't treat viral illness. Taking antibiotics when they are not needed increases your risk of getting an infection later that is antibiotic-resistant.Your provider will prescribe antibiotics if the infection is caused by bacteria. If they are prescribed:   Take the medication until it is used up, even if symptoms have improved. If you don't, the bronchitis may come back.   Take them as directed. For instance, some medications should be taken with food.   Ask your provider or pharmacist what side effects are common, and what to do about them.  Follow-up care  You should go see your provider again in 2 to 3 weeks. By this time, symptoms should have improved. An infection that lasts longer may signal a more serious problem.  Prevention   Avoid tobacco smoke. If you smoke, quit. Stay away from smoky places. Ask friends and family not to smoke around you, or in your home or car.   Get checked forallergies.   Ask your provider about getting a yearly flu shot, and   pneumoccocal or pneumonia shots.   Wash your hands often. This helps reduce the chance of picking up viruses that cause colds and flu.       [address] [employer] [address], [place], [place] [address]. All rights reserved. This information is not intended as a substitute for professional medical care. Always follow your healthcare professional's instructions.          Bronchitis, Antibiotic Treatment(Adult)    Bronchitis is an infection of the air passages (bronchial tubes) in your lungs. It  often occurs when you have a cold.  Symptoms of bronchitis include cough with mucus (phlegm) and low-grade fever. Bronchitis usually lasts 7 to 14 days. Mild cases can be treated with simple home remedies. More severe infection is treated with an antibiotic.  Home care  Follow these guidelines when caring for yourself at home:   If your symptoms are severe, rest at home for the first 2 to 3 days. When you go back to your usual activities, don't let yourself get too tired.   Don't smoke. Avoid being around the smoke of others.   You may use acetaminophen or ibuprofen to control fever or pain, unless another medicine was prescribed for this. If you have chronic liver or kidney disease, talk with your health care provider before using these medicines. Also talk with your provider if you've had a stomach ulcer or GI bleeding.   Your appetite may be poor, so follow a light diet. Drink 6 to 8 glasses of fluid every day. This will help you avoid dehydration. Fluids can include water, soft, drinks, juices, tea, and soup. Extra fluids will help loosen secretions in your lungs.   Over-the-counter cough medicines that containdextromethorphan may help relieve cough and congestion. Decongestants may also help with this. Don't use decongestants if you have high blood pressure.   Finish all antibiotic medicine. Do this even if you are feeling better after only a few days.  Follow-up care  Follow up with your health care provider, or as advised, if you don't start to feel better after 3 days.  You should get a pneumococcal conjugate vaccine if you are 65 or older. This is also called a PCV or PCV13 vaccine. You should also have this vaccine if you have chronic asthma or COPD.  Talk with your health care provider about getting a pneumococcal polysaccharide vaccine (PPV or PPSV23) if you got a PCV13 vaccine today. You should also get a flu shot (influenza vaccine) every autumn.  When to seek medical advice  Call your health  care provider right away if any of these occur:   Fever over 100.16F (38.0C) for more than 3 days   Trouble breathing, or you have wheezing or pain with breathing   Coughing up blood or increased amounts of colored sputum   Weakness, drowsiness, headache, facial pain, ear pain, or a stiff neck   2000-2015 [employer] [address], [place], [place] [address]. All rights reserved. This information is not intended as a substitute for professional medical care. Always follow your healthcare professional's instructions.          Understanding Urinary Tract Infections (UTIs)  Most UTIs are caused by bacteria, although they may also be caused by viruses or fungi. Bacteria from the bowel are the most common source of infection.The infection may begin because of any of the following:   Sexual activity. During sex, germs can travel from the penis, vagina, or rectum into the  urethra.   Germs on the skinoutside the rectum may travel into the urethra. This is more common in women since the rectum and urethra are closer to each other than in men. Wiping from front to back after using the toilet and keeping the area clean can help prevent germs from getting to the urethra.   Blockage of urine flow through the urinary tract. If urine sits too long, germs may begin to grow out of control.    Parts of the urinary tract  The infection canoccur in any part of the urinary tract.   The kidneys collect and store urine.   The ureters carry urine from the kidneys to the bladder.   The bladder holds urine until you are ready to let it out.   The urethra carries urine from the bladder out of the body. It is shorter in women, so bacteria can move throughit more easily.The urethra is longer in men, so a UTI is less likely to reach the bladder or kidneys in men.   [address] [employer] [address], [place], [place] [address]. All rights reserved. This information is not intended as a  substitute for professional medical care. Always follow your healthcare professional's instructions.          Urinary Tract Infections in Women  Urinary tract infections (UTIs) are most often caused by bacteria (germs). These bacteria enter the urinary tract. The bacteria may come from outside the body. Or they may travel from the skin outside therectum or vagina into the urethra. Female anatomy makes it easier for bacteria from the bowelto enter a woman's urinary tract, which is the most common source of UTI. This means women develop UTIs more often than men. Pain in or around the urinary tract is a common UTI symptom. But the only way to know for sure if you have a UTI for the health care provider to test your urine. The two tests that may be done are the urinalysis and urine culture.  Types of UTIs   Cystitis: A bladder infection (cystitis) is the most common UTI in women. You may have urgent or frequent urination. You may also havepain, burning when you urinate, and bloody urine.   Urethritis: This is an inflamed urethra, which is the tube that carries urine from the bladder to outside the body. You may have lower stomach or back pain. You may also have urgent or frequent urination.   Pyelonephritis: This is a kidney infection. If not treated, it can be serious and damage your kidneys. In severe cases, you may be hospitalized. You may have a fever and lower back pain.  Medications to treat a UTI  Most UTIs are treated with antibiotics. These kill the bacteria. The length of time you need to take them depends on the type of infection. It may be as short as 3 days. If you have repeated UTIs, a low-dose antibiotic may be needed for several months. Take antibiotics exactly as directed. Don't stop taking them until all of the medication is gone. If you stop taking the antibiotic too soon, the infection may not go away, and you may develop a resistance to the antibiotic. This can make it much harder to  treat.  Lifestyle changes to treat and prevent UTIs  The lifestyle changes below will help get rid of your UTI. They may also help prevent future UTIs.   Drink plenty of fluids. This includes water, juice, or other caffeine-free drinks. Fluids help flush  bacteria out of your body.   Empty your bladder. Always empty your bladder when you feel the urge to urinate. And always urinate before going to sleep. Urine that stays in your bladder can lead to infection. Try to urinate before and after sex as well.   Practice good personal hygiene. Wipe yourself from front to back after using the toilet. This helps keep bacteria from getting into the urethra.   Use condoms during sex. These help prevent UTIs caused by sexually transmitted bacteria. Also, avoid using spermicides during sex. These can increase the risk of UTIs. Choose other forms of birth control instead. For women who tend to get UTIs after sex, a low-dose of a preventive antibiotic may be used. Be sure to discuss this option with your health care provider.   Follow up with your health care provider as directed. He or she may test to make sure the infection has cleared. If necessary, additional treatment may be started.   [address] [employer] [address] [place], [place] [address]. All rights reserved. This information is not intended as a substitute for professional medical care. Always follow your healthcare professional's instructions.          Viral Syndrome (Adult)  A viral illness may cause a number of symptoms. The symptoms depend on the part of the body that the virus affects. If it settles in the nose, throat, and lungs, it may cause cough, sore throat, congestion, and sometimes headache. If it settles in the stomach and intestinal tract, it may cause vomiting and diarrhea. Sometimes it causes vague symptoms like "aching all over," feeling tired, loss of appetite, or fever.  A viral illness usually lasts1 to 2 weeks, but  sometimes it lasts longer. In some cases, a more serious infection can look like a viral syndrome in the first few days of the illness. You may need anotherexam and additional teststo know the difference.Watch for the warning signs listed below.  Home care  Follow these guidelines for taking care of yourself at home:   If symptoms are severe, rest at home for the first 2 to 3 days.   Stay away from cigarette smoke - both your smoke and the smoke from others.   You may useacetaminophen or ibuprofen for fever, muscle aching, and headache, unless another medicine was prescribed for this.If you have chronic liver or kidney disease or ever had a stomach ulcer or GI bleeding, talk with your doctor before using these medicinesNo one who is younger than 60 and ill with a fever should take aspirin. It may cause severe liver damage.   Your appetite may be poor, so a light diet is fine. Avoid dehydration by drinking 8 to 12 8-ounce glasses of fluids each day. This may include water; orange juice; lemonade; apple, grape, and cranberry juice; clear fruit drinks; electrolyte replacement and sports drinks; and decaffeinated teas and coffee. If you have been diagnosed with a kidney disease, ask your doctor how much and what types of fluids you should drink to prevent dehydration. If you have kidney disease, drinking too much fluid can cause it build up in the your body and be dangerous to your health.   Over-the-counter remedies won't shorten the length of the illness but may be helpful forcough, sore throat; and nasal and sinus congestion. Don't use decongestants if you have high blood pressure.  Follow-up care  Follow up with your health care provider if you do not improve over the next week.  When to seek medical advice  Call your health care provider right awayif any of these occur:   Cough with lots of colored sputum (mucus) or blood in your sputum   Chest pain, shortness of breath, wheezing, or difficulty  breathing   Severe headache; face, neck, or ear pain   Severe, constant pain in the lower right side of your belly (abdominal)   Continued vomiting (can't keep liquids down)   Frequent diarrhea (more than 5 times a day); blood (red or black color) or mucus in diarrhea   Feeling weak, dizzy, or like you are going to faint   Extreme thirst   Fever of 100.4 F (38 C) oral or higher, not better with fever medication   [employer]   2000-2015 [employer] [address] [place], [place] [address]. All rights reserved. This information is not intended as a substitute for professional medical care. Always follow your healthcare professional's instructions.

## 2015-02-25 NOTE — ED Notes (Signed)
Patient given water to drink.  

## 2015-02-25 NOTE — ED Provider Notes (Signed)
[facility] EMERGENCY DEPARTMENT History and Physical Exam      Patient Name: [name], [name]  Encounter Date:  02/25/2015  Attending Physician: Hall Busing Ellis Koffler, MD  PCP: Vivia Ewing, NP  Patient DOB:  09-22-1986  MRN:  [id number]  Room:  S22/S22-A      History of Presenting Illness     Chief complaint: Abdominal Pain and Cough    HPI/ROS is limited by: none  HPI/ROS given by: patient    Location: Abdomen and Chest  Duration: 5 days  Severity: moderate    [name] is a 29 y.o. female who presents with RLQ abdominal pain. Her pain began this AM upon waking up is exacerbated when walking. For the past 5 days she has c/o congestion, a productive cough, diaphoresis, nausea, decreased appetite/food intake, dysuria, fever and chills. Denies vomiting, diarrhea, or constipation. She went to [facility] yesterday, was prescribed a z pack, and advised to come to the ED. She states she wanted to wait to see if she got better but this AM she was feeling worse. History of Crohn's; doesn't think this is what it is. She has never had surgery for this in the past. No has all of her abdominal organs.     Review of Systems     Review of Systems   Constitutional: Positive for fever, chills, diaphoresis and appetite change (decreased).   HENT: Positive for congestion. Negative for ear pain, nosebleeds, sinus pressure and sore throat.    Eyes: Negative.    Respiratory: Positive for cough. Negative for chest tightness and shortness of breath.    Cardiovascular: Negative for chest pain.   Gastrointestinal: Positive for nausea and abdominal pain. Negative for vomiting, diarrhea and constipation.   Genitourinary: Positive for dysuria. Negative for urgency and hematuria.   Musculoskeletal: Negative for myalgias, back pain, arthralgias and gait problem.   Skin: Negative for rash and wound.   Neurological: Negative for dizziness, weakness, light-headedness and numbness.   Psychiatric/Behavioral: The patient is not nervous/anxious.           Allergies     Pt is allergic to bactrim and sulfa antibiotics.    Medications     Current Outpatient Rx   Name  Route  Sig  Dispense  Refill   . Certolizumab Pegol (CIMZIA SC)    Subcutaneous    Inject into the skin.             . clonazePAM (KLONOPIN) 1 MG tablet    Oral    Take 1 mg by mouth daily.             Marland Kitchen levothyroxine (SYNTHROID, LEVOTHROID) 88 MCG tablet    Oral    Take 88 mcg by mouth daily.             Marland Kitchen ALPRAZolam (XANAX PO)    Oral    Take 0.5 mg by mouth as needed.                . budesonide (ENTOCORT EC) 3 MG 24 hr capsule    Oral    Take 2 capsules (6 mg total) by mouth every morning.    30 capsule    0     . cefdinir (OMNICEF) 300 MG capsule    Oral    Take 1 capsule (300 mg total) by mouth 2 (two) times daily.    14 capsule    0     . Dicyclomine HCl (BENTYL PO)  Oral    Take by mouth as needed.             . fish oil-omega-3 fatty acids 1000 MG capsule    Oral    Take 2 g by mouth daily.             Marland Kitchen guaiFENesin (MUCINEX) 600 MG 12 hr tablet    Oral    Take 2 tablets (1,200 mg total) by mouth 2 (two) times daily.    20 tablet    0     . HYDROcodone-acetaminophen (NORCO) 5-325 MG per tablet    Oral    Take 1-2 tablets by mouth every 6 (six) hours as needed for Pain.    20 tablet    0     . hydrocodone-chlorpheniramine (TUSSIONEX PENNKINETIC ER) 10-8 MG/5ML suspension    Oral    Take 5 mLs by mouth 2 (two) times daily.    50 mL    0     . Ibuprofen (MOTRIN PO)    Oral    Take by mouth as needed.                Marland Kitchen levofloxacin (LEVAQUIN) 500 MG tablet    Oral    Take 1 tablet (500 mg total) by mouth daily.    5 tablet    0     . Multiple Vitamins-Minerals (HAIR/SKIN/NAILS) Tab        Take 1 Tab by Mouth Once a Day. Indications: Vitamin Deficiency             . norelgestromin-ethinyl estradiol (ORTHO EVRA) 150-35 MCG/24HR    Transdermal    Place 1 patch onto the skin once a week.             . ondansetron (ZOFRAN ODT) 4 MG disintegrating tablet    Oral    Take 1 tablet (4 mg total) by mouth  every 8 (eight) hours as needed for Nausea.    20 tablet    0     . ondansetron (ZOFRAN ODT) 4 MG disintegrating tablet    Oral    Take 1 tablet (4 mg total) by mouth every 8 (eight) hours as needed for Nausea.    20 tablet    0     . oxyCODONE-acetaminophen (PERCOCET) 5-325 MG per tablet        1-2 tablets by mouth every 4-6 hours as needed for pain;  Do not drive or operate machinery while taking this medicine    15 tablet    0     . oxyCODONE-acetaminophen (PERCOCET) 5-325 MG per tablet        1-2 tablets by mouth every 4-6 hours as needed for pain;  Do not drive or operate machinery while taking this medicine    10 tablet    0     . Zolpidem Tartrate (AMBIEN PO)    Oral    Take 10 mg by mouth nightly as needed.                     Past Medical History     Pt has a past medical history of Crohn's disease; H/O Hashimoto thyroiditis; Anxiety; Abortion; and Tachycardia.    Past Surgical History     Pt has past surgical history that includes Breast Implant; Induced abortion; D & C, SUCTION (N/A, 08/24/2014); and Colonoscopy.    Family History     The family history is not  on file.    Social History     Pt reports that she has never smoked. She has never used smokeless tobacco. She reports that she drinks alcohol. She reports that she does not use illicit drugs.    Physical Exam     Blood pressure 129/80, pulse 105, temperature 98.2 F (36.8 C), temperature source Oral, resp. rate 20, height 1.549 m, weight 44.1 kg, last menstrual period 02/14/2015, SpO2 99 %, currently breastfeeding.    Physical Exam   Constitutional: She is oriented to person, place, and time. She appears well-developed and well-nourished. No distress.   HENT:   Head: Normocephalic and atraumatic.   Right Ear: Hearing and ear canal normal.   Left Ear: Hearing and ear canal normal.   Mouth/Throat: Mucous membranes are normal. Posterior oropharyngeal erythema present. No oropharyngeal exudate.   She sounds very congested. Tonsils are enlarged. Fluid  behind her TMs.   Eyes: Pupils are equal, round, and reactive to light.   Conjunctivitis in R eye.   Neck: Normal range of motion. Neck supple.   Cardiovascular: Normal rate, regular rhythm and normal heart sounds.  Exam reveals no gallop and no friction rub.    No murmur heard.  Pulmonary/Chest: Effort normal and breath sounds normal. She has no wheezes. She has no rales.   Abdominal: Soft. Bowel sounds are normal. There is tenderness in the right lower quadrant. There is CVA tenderness (mild right-sided).   Exam is benign, no rebound no guarding, especially when distracted.   Musculoskeletal: Normal range of motion. She exhibits no tenderness.   Neurological: She is alert and oriented to person, place, and time.   Skin: Skin is warm and dry. No rash noted.   Psychiatric: She has a normal mood and affect.   Nursing note and vitals reviewed.       Orders Placed     Orders Placed This Encounter   Procedures   . Urinalysis   . CBC and differential   . HCG, Qualitative, Urine   . Basic Metabolic Panel   . Encourage fluids       Diagnostic Results       The results of the diagnostic studies below have been reviewed by myself:    Labs  Results     Procedure Component Value Units Date/Time    Urinalysis [[id number]]  (Abnormal) Collected:  02/25/15 1534    Specimen Information:  Urine, Random / Urine, Clean Catch Updated:  02/25/15 1601     Color, UA Yellow      Clarity, UA Cloudy (A)      Specific Gravity, UR 1.023      pH, Urine 6.0 pH      Protein, UR 30 (A) mg/dL      Glucose, UA Negative mg/dL      Ketones UA 5 mg/dL      Bilirubin, UA Negative      Blood, UA Negative      Nitrite, UA Negative      Urobilinogen, UA Normal mg/dL      Leukocyte Esterase, UA 25 (A) Leu/uL      WBC, UA 14 (H) /hpf      RBC, UA 2 /hpf      Bacteria, UA Occasional (A) /hpf      Squam Epithel, UA 45 (H) /hpf     HCG, Qualitative, Urine [[id number]] Collected:  02/25/15 1534    Specimen Information:  Urine, Random Updated:  02/25/15 1556  human chorionic gonadotropin (hCG), UR, Drema Dallas Negative     Basic Metabolic Panel [[id number]] Collected:  02/25/15 1247    Specimen Information:  Plasma Updated:  02/25/15 1421     Sodium 139 mMol/L      Potassium 4.0 mMol/L      Chloride 104 mMol/L      CO2 24.9 mMol/L      CALCIUM 9.6 mg/dL      Glucose 90 mg/dL      Creatinine 0.63 mg/dL      BUN 12 mg/dL      Anion Gap 01.6 mMol/L      BUN/Creatinine Ratio 15.4 Ratio      EGFR >60 mL/min/1.9m2      Osmolality Calc 277 mOsm/kg     CBC and differential [[id number]]  (Abnormal) Collected:  02/25/15 1247    Specimen Information:  Blood / Blood Updated:  02/25/15 1251     WBC 12.6 (H) K/cmm      RBC 4.41 M/cmm      Hemoglobin 14.0 gm/dL      Hematocrit 73.2 %      MCV 99 fL      MCH 32 pg      MCHC 32 gm/dL      RDW 20.2 %      PLT CT 489 (H) K/cmm      MPV 6.0 fL      NEUTROPHIL % 65.0 %      Lymphocytes 22.5 %      Monocytes 11.2 %      Eosinophils % 0.7 %      Basophils % 0.5 %      Neutrophils Absolute 8.2 K/cmm      Lymphocytes Absolute 2.8 K/cmm      Monocytes Absolute 1.4 K/cmm      Eosinophils Absolute 0.1 K/cmm      BASO Absolute 0.1 K/cmm           Radiologic Studies  Radiology Results (24 Hour)     ** No results found for the last 24 hours. **            MDM / Critical Care     Blood pressure 129/80, pulse 105, temperature 98.2 F (36.8 C), temperature source Oral, resp. rate 20, height 1.549 m, weight 44.1 kg, last menstrual period 02/14/2015, SpO2 99 %, currently breastfeeding.    The patient presents with abdominal pain without signs of peritonitis or other life-threatening or serious etiology. Many etiologies of the pain were considered such as appendicitis, bowel obstruction, and thought unlikely based on evaluation and presentation.  However the patient was given precautions and instructions to return if symptoms worsen or change in any way, or in 8-12 hours if not improved for re-evaluation.  Follow-up with the patient's primary care physician was  encouraged.  Patient was well-appearing on serial reevaluation at time of disposition and given abdominal pain precautions.  Diagnostic impression and plan were discussed with the patient and/or family.  Results of lab/radiology tests were discussed with the patient and/or family. All questions were answered and concerns addressed.    The patient's presentation is suggestive of a viral syndrome. The patient is clinically well appearing. Bacterial or other serious etiologies like strep throat, meningitis, lyme disease, pneumonia were considered but are felt unlikely based upon the history and exam. There is no history of immune compromise. There are no meningeal or sepsis indicators. The patient appears stable for discharge and fever precautions were given. The patient  was encouraged to follow-up with their primary care physician as needed and for review of pending labs if instructed to do so.  Diagnostic impression and plan were discussed with the patient and/or family.  Results of lab/radiology tests were reviewed and discussed with the patient and/or family. All questions were answered and concerns addressed. The patient appears appropriate for outpatient management with symptomatic treatment and primary care physician follow-up.  Warned to return immediately for worsening symptoms or any concerns.    She felt better after meds. She is non toxic, urine was contaminated but she has some dysuria and hx of pyelo so I will treat.  Levaquin will treat both bronchial stuff and urine.    Procedures     None    EKG     None    Diagnosis / Disposition     Clinical Impression  1. Bronchitis    2. Viral syndrome    3. Acute UTI        Disposition  ED Disposition     Discharge Anona Clorissa Gruenberg discharge to home/self care.    Condition at disposition: Stable            Prescriptions  Discharge Medication List as of 02/25/2015  4:23 PM      START taking these medications    Details   guaiFENesin (MUCINEX) 600 MG 12 hr tablet  Take 2 tablets (1,200 mg total) by mouth 2 (two) times daily., Starting 02/25/2015, Until Fri 03/02/15, Print      hydrocodone-chlorpheniramine (TUSSIONEX PENNKINETIC ER) 10-8 MG/5ML suspension Take 5 mLs by mouth 2 (two) times daily., Starting 02/25/2015, Until Discontinued, Print      levofloxacin (LEVAQUIN) 500 MG tablet Take 1 tablet (500 mg total) by mouth daily., Starting 02/25/2015, Until Fri 03/02/15, Print             Attestations     The documentation recorded by my scribe, Laverle Hobby, accurately reflects the services I personally performed and the decisions made by me.  Hall Busing Desmond Szabo, MD      Mirelle Biskup, Hall Busing, MD  02/25/15 [id number]

## 2015-05-03 DIAGNOSIS — J020 Streptococcal pharyngitis: Secondary | ICD-10-CM | POA: Insufficient documentation

## 2015-07-13 DIAGNOSIS — R55 Syncope and collapse: Secondary | ICD-10-CM | POA: Insufficient documentation

## 2015-07-13 DIAGNOSIS — N83209 Unspecified ovarian cyst, unspecified side: Secondary | ICD-10-CM | POA: Insufficient documentation

## 2015-10-15 DIAGNOSIS — D649 Anemia, unspecified: Secondary | ICD-10-CM | POA: Insufficient documentation

## 2015-11-01 ENCOUNTER — Emergency Department: Payer: 59

## 2015-11-01 ENCOUNTER — Emergency Department
Admission: EM | Admit: 2015-11-01 | Discharge: 2015-11-01 | Disposition: A | Discharge status: Home / Self Care | Payer: 59

## 2015-11-01 DIAGNOSIS — E860 Dehydration: Secondary | ICD-10-CM

## 2015-11-01 DIAGNOSIS — J209 Acute bronchitis, unspecified: Secondary | ICD-10-CM | POA: Insufficient documentation

## 2015-11-01 DIAGNOSIS — R7989 Other specified abnormal findings of blood chemistry: Secondary | ICD-10-CM | POA: Insufficient documentation

## 2015-11-01 DIAGNOSIS — R8299 Other abnormal findings in urine: Secondary | ICD-10-CM | POA: Insufficient documentation

## 2015-11-01 LAB — CBC AND DIFFERENTIAL
Basophils %: 0.7 % (ref 0.0–3.0)
Basophils Absolute: 0.1 10*3/uL (ref 0.0–0.3)
Eosinophils %: 0.9 % (ref 0.0–7.0)
Eosinophils Absolute: 0.1 10*3/uL (ref 0.0–0.8)
Hematocrit: 39.9 % (ref 36.0–48.0)
Hemoglobin: 13.4 gm/dL (ref 12.0–16.0)
Lymphocytes Absolute: 3.1 10*3/uL (ref 0.6–5.1)
Lymphocytes: 37.2 % (ref 15.0–46.0)
MCH: 33 pg (ref 28–35)
MCHC: 34 gm/dL (ref 32–36)
MCV: 97 fL (ref 80–100)
MPV: 6.8 fL (ref 6.0–10.0)
Monocytes Absolute: 1 10*3/uL (ref 0.1–1.7)
Monocytes: 11.7 % (ref 3.0–15.0)
Neutrophils %: 49.5 % (ref 42.0–78.0)
Neutrophils Absolute: 4.2 10*3/uL (ref 1.7–8.6)
PLT CT: 591 10*3/uL — ABNORMAL HIGH (ref 130–440)
RBC: 4.12 10*6/uL (ref 3.80–5.00)
RDW: 12 % (ref 11.0–14.0)
WBC: 8.4 10*3/uL (ref 4.0–11.0)

## 2015-11-01 LAB — VH POCT URINE HCG
Operator ID: 35197
Urine HCG Qualitative: NEGATIVE
Urine Specific Gravity: 1.013 (ref 1.002–1.030)

## 2015-11-01 LAB — BASIC METABOLIC PANEL
Anion Gap: 11 mMol/L (ref 7.0–18.0)
BUN / Creatinine Ratio: 11.1 Ratio (ref 10.0–30.0)
BUN: 9 mg/dL (ref 7–22)
CO2: 23.7 mMol/L (ref 20.0–30.0)
Calcium: 9.4 mg/dL (ref 8.5–10.5)
Chloride: 107 mMol/L (ref 98–110)
Creatinine: 0.81 mg/dL (ref 0.60–1.20)
EGFR: >60 mL/min/{1.73_m2}
Glucose: 84 mg/dL (ref 70–99)
Osmolality Calc: 274 mOsm/kg — ABNORMAL LOW (ref 275–300)
Potassium: 3.7 mMol/L (ref 3.5–5.3)
Sodium: 138 mMol/L (ref 136–147)

## 2015-11-01 LAB — VH URINALYSIS WITH MICROSCOPIC
Bilirubin, UA: NEGATIVE
Blood, UA: NEGATIVE
Glucose, UA: NEGATIVE mg/dL
Ketones UA: NEGATIVE mg/dL
Leukocyte Esterase, UA: NEGATIVE Leu/uL
Nitrite, UA: NEGATIVE
Protein, UR: NEGATIVE mg/dL
RBC, UA: 1 /hpf (ref 0–5)
Squam Epithel, UA: 1 /hpf (ref 0–2)
Urine Specific Gravity: 1.013 (ref 1.001–1.040)
Urobilinogen, UA: NORMAL mg/dL
WBC, UA: 1 /hpf (ref 0–4)
pH, Urine: 7 pH (ref 5.0–8.0)

## 2015-11-01 LAB — VH INFLUENZA A/B RAPID TEST
Influenza A: NEGATIVE
Influenza B: NEGATIVE

## 2015-11-01 LAB — VH STREP A RAPID TEST: Strep A, Rapid: NEGATIVE

## 2015-11-01 MED ORDER — AZITHROMYCIN 250 MG PO TABS
ORAL_TABLET | ORAL | Status: DC
Start: 2015-11-01 — End: 2016-10-24

## 2015-11-01 MED ORDER — AZITHROMYCIN 250 MG PO TABS
ORAL_TABLET | ORAL | Status: AC
Start: 2015-11-01 — End: ?
  Filled 2015-11-01: qty 2

## 2015-11-01 MED ORDER — AZITHROMYCIN 250 MG PO TABS
500.0000 mg | ORAL_TABLET | Freq: Once | ORAL | Status: AC
Start: 2015-11-01 — End: 2015-11-01
  Administered 2015-11-01: 500 mg via ORAL

## 2015-11-01 MED ORDER — SODIUM CHLORIDE 0.9 % IV BOLUS
1000.0000 mL | Freq: Once | INTRAVENOUS | Status: AC
Start: 2015-11-01 — End: 2015-11-01
  Administered 2015-11-01: 1000 mL via INTRAVENOUS

## 2015-11-01 NOTE — Discharge Instructions (Signed)
Acute Bronchitis  Your health care provider has told you that you have acute bronchitis. Bronchitis is infection or inflammation of the bronchial tubes (airways in the lungs). Normally, air moves easily in and out of the airways. Bronchitis narrows the airways, making it harder for air to flow in and out of the lungs. This causes symptoms such as shortness of breath, coughing, and wheezing. Bronchitis can be "acute" or "chronic." Acute means the condition comes on quickly and goes away in a short time. Chronic means a condition lasts a long time and often comes back. Read on to learn more about acute bronchitis.    What Causes Acute Bronchitis?  Acute bronchitis almost always starts as a viral respiratory infection, such as a cold or the flu. Certain factors make it more likely for a cold or flu to turn into bronchitis. These include being very young or very old or having a heart or lung problem. Cigarette smoking also makes bronchitis more likely.  When bronchitis develops, the airways become swollen. The airways may also become infected with bacteria. This is known as a secondary infection.  Diagnosing Acute Bronchitis  Your health care provider will examine you and ask about your symptoms and health history. You may also have a sputum culture to test the fluid in your lungs. Chest X-rays may be done to look for infection in the lungs.  Treating Acute Bronchitis  Bronchitis usually clears up as the cold or flu goes away. You can help feel better faster by doing the following:   Take medication as directed. You may be told to take ibuprofen or other over-the-counter medications. These help relieve inflammation in your bronchial tubes. Your doctor may prescribe an inhaler to help open up the bronchial tubes. Most of the time,acute bronchitisis caused by a viral infection. Antibiotics are usually not prescribed for viral infections.   Drink plenty of fluids, such as water, juice, or warm soup. Fluids loosen  mucus so that you can cough it up. This helps you breathe more easily. Fluids also prevent dehydration.   Make sure you get plenty of rest.   Do not smoke. Do not allow anyone else to smoke in your home.  Recovery and Follow-Up  Follow up with your doctor as you are told. You will likely feel better in a week or two. But a dry cough can linger beyond that time. Let your doctor know if you still have symptoms (other than a dry cough) after 2 weeks. If you're prone to getting bronchial infections, let your doctor know. And take steps to protect yourself from future infections. These steps include stopping smoking and avoiding tobacco smoke, washing your hands often, and getting a yearly flu shot.     2000-2015 [employer] [address], [place] [address]. All rights reserved. This information is not intended as a substitute for professional medical care. Always follow your healthcare professional's instructions.

## 2015-11-01 NOTE — ED Provider Notes (Signed)
CHIEF COMPLAINT: Cough and shortness of breath.    HISTORY: [name] is a 29 year old, white female, patient of Dr.  Dia Crawford.  The patient started with cough and recent bronchitis.  Today she developed more shortness of breath and weakness.  She  reports that the shortness of breath started yesterday.  She had  some intermittent sharp chest pain today.  She was recently  diagnosed with bronchitis.  She reports that she had fever  today.  She had been admitted to [facility]  one month ago for a Crohn's disease flare up.  She had one  episode of diarrhea today.  She did not have fever here in the  emergency room.  She does have some tachycardia and dehydration  and she arrived to the ER in good condition.  The patient was  also concerned that her pregnancy test might be positive.    PAST MEDICAL HISTORY:  1.  Crohn's disease.  2.  History of Hashimoto's thyroiditis.  3.  History of tachycardia.    PAST SURGICAL HISTORY: Reviewed in the electronic medical record  and is to remain to this visit.    SOCIAL HISTORY: She is a nonsmoker.    ALLERGIES: To BACTRIM.    MEDICATIONS:  1.  Xanax.  2.  Entocort.  3.  Omnicef.  4.  Cimzia.  5.  Klonopin.  6.  Bentyl.  7.  Fish oral.  8.  Norco.  9.  Tussionex.  10.  Motrin.  11.  Synthroid.  12.  Multivitamin.  13.  Estradiol.  14.  Zofran.  15.  Percocet.  16.  Ambient.    Nurse's notes reviewed.  Vital signs reviewed.    PHYSICAL EXAMINATION:  GENERAL: Healthy thin young white female.  VITAL SIGNS:  BP was 140/78, pulse of 130, respiratory rate 20,  temperature 97.9, O2 sat 100%.  HEENT:  Normal.  Sinuses clear.  TMs normal.  NECK:  Supple.  LUNGS:  Clear to auscultation.  No rales, rhonchi, or wheezes.  CARDIAC:  Regular rate and rhythm without murmur.  ABDOMEN:  Scaphoid, soft and nontender with normoactive bowel  sounds.  BACK:  Normal.  EXTREMITIES:  Without cyanosis, clubbing, or edema.  NEURO:  Normal.    HOSPITAL COURSE AND TREATMENT: Studies show a normal  CBC except  for a platelet count of 591,000 electrolytes are normal.  HCG  negative.  Influenza negative.  Rapid strep negative.  Urinalysis is normal.  Chest x-ray is normal.  EKG sinus  tachycardia.    PLAN: The patient was treated with IV fluids with improvement.  I am going to start her on a Z-Pak.  She will follow up Dr.  Dia Crawford in two days and the patient is treated and released in  good and stable condition.    DIAGNOSTIC IMPRESSION: Acute bronchitis with dehydration.        [id number]  DD: 11/01/2015 20:36:35  DT: 11/01/2015 21:00:37  JOB: [id number]

## 2015-11-01 NOTE — ED Notes (Signed)
Patient presents via triage with CC SOB and CP. Patient reports SOB onset yesterday, developed CP today. Patient recently diagnosed with bronchitis. Patient reports productive cough. Subjective fevers. Patient denies recent travel. Also states recent admit for "Crohn's flare-up" and has had diarrhea today.

## 2015-11-01 NOTE — ED Notes (Signed)
MD at bedside. Powers, MD

## 2015-11-01 NOTE — ED Provider Notes (Signed)
Physician/Midlevel provider first contact with patient: 11/01/15 1754         History     Chief Complaint   Patient presents with   . Shortness of Breath     HPI dictated 20:40        Past Medical History   Diagnosis Date   . Crohn's disease    . H/O Hashimoto thyroiditis      hypothyroidism   . Anxiety    . Abortion    . Tachycardia        Past Surgical History   Procedure Laterality Date   . Breast implant     . Induced abortion     . D & c, suction N/A 08/24/2014     Procedure: D & C, SUCTION;  Surgeon: Maryruth Hancock, MD;  Location: [facility];  Service: Obstetrics;  Laterality: N/A;   . Colonoscopy         No family history on file.    Social  Social History   Substance Use Topics   . Smoking status: Never Smoker    . Smokeless tobacco: Never Used   . Alcohol Use: Yes      Comment: rare       .     Allergies   Allergen Reactions   . Bactrim [Sulfamethoxazole W/Trimethoprim (Co-Trimoxazole)] Rash   . Sulfa Antibiotics Rash       Home Medications     Last Medication Reconciliation Action:  In Progress Carloyn Jaeger, RN 11/01/2015  5:52 PM                  ALPRAZolam (XANAX PO)     Take 0.5 mg by mouth as needed.        budesonide (ENTOCORT EC) 3 MG 24 hr capsule     Take 2 capsules (6 mg total) by mouth every morning.     cefdinir (OMNICEF) 300 MG capsule     Take 1 capsule (300 mg total) by mouth 2 (two) times daily.     Certolizumab Pegol (CIMZIA SC)     Inject into the skin.     clonazePAM (KLONOPIN) 1 MG tablet     Take 1 mg by mouth daily.     Dicyclomine HCl (BENTYL PO)     Take by mouth as needed.     fish oil-omega-3 fatty acids 1000 MG capsule     Take 2 g by mouth daily.     HYDROcodone-acetaminophen (NORCO) 5-325 MG per tablet     Take 1-2 tablets by mouth every 6 (six) hours as needed for Pain.     hydrocodone-chlorpheniramine (TUSSIONEX PENNKINETIC ER) 10-8 MG/5ML suspension     Take 5 mLs by mouth 2 (two) times daily.     Ibuprofen (MOTRIN PO)     Take by mouth as needed.         levothyroxine (SYNTHROID, LEVOTHROID) 88 MCG tablet     Take 88 mcg by mouth daily.     Multiple Vitamins-Minerals (HAIR/SKIN/NAILS) Tab     Take 1 Tab by Mouth Once a Day. Indications: Vitamin Deficiency     norelgestromin-ethinyl estradiol (ORTHO EVRA) 150-35 MCG/24HR     Place 1 patch onto the skin once a week.     ondansetron (ZOFRAN ODT) 4 MG disintegrating tablet     Take 1 tablet (4 mg total) by mouth every 8 (eight) hours as needed for Nausea.     ondansetron (ZOFRAN ODT) 4  MG disintegrating tablet     Take 1 tablet (4 mg total) by mouth every 8 (eight) hours as needed for Nausea.     oxyCODONE-acetaminophen (PERCOCET) 5-325 MG per tablet     1-2 tablets by mouth every 4-6 hours as needed for pain;  Do not drive or operate machinery while taking this medicine     oxyCODONE-acetaminophen (PERCOCET) 5-325 MG per tablet     1-2 tablets by mouth every 4-6 hours as needed for pain;  Do not drive or operate machinery while taking this medicine     Zolpidem Tartrate (AMBIEN PO)     Take 10 mg by mouth nightly as needed.              Review of Systems    Physical Exam    BP: 140/78 mmHg, Heart Rate: (!) 130, Temp: 97.9 F (36.6 C), Resp Rate: 20, SpO2: 100 %, Weight: 45.3 kg    Physical Exam      MDM and ED Course     ED Medication Orders     Start Ordered     Status Ordering Provider    11/01/15 2044 11/01/15 2043  azithromycin (ZITHROMAX) tablet 500 mg   Once in ED     Route: Oral  Ordered Dose: 500 mg     Acknowledged Loghan Subia W    11/01/15 1910 11/01/15 1909  sodium chloride 0.9 % bolus 1,000 mL   Once in ED     Route: Intravenous  Ordered Dose: 1,000 mL     Last MAR action:  Stopped Hiroko Tregre W    11/01/15 1803 11/01/15 1802  sodium chloride 0.9 % bolus 1,000 mL   Once in ED     Route: Intravenous  Ordered Dose: 1,000 mL     Last MAR action:  Stopped Roshunda Keir W             MDM          Procedures    Clinical Impression & Disposition     Clinical Impression  Final diagnoses:   Acute bronchitis,  unspecified organism   Dehydration        ED Disposition     Discharge Addaleigh Harless Nakayama discharge to home/self care.    Condition at disposition: Stable             New Prescriptions    AZITHROMYCIN (ZITHROMAX) 250 MG TABLET    Take 2 pills on the first day and then one each day                   Cordella Register, MD  11/01/15 2043

## 2015-11-03 LAB — ECG 12-LEAD
P Wave Axis: 77 deg
P-R Interval: 102 ms
Patient Age: 29 years
Q-T Interval(Corrected): 446 ms
Q-T Interval: 314 ms
QRS Axis: 74 deg
QRS Duration: 68 ms
T Axis: 31 years
Ventricular Rate: 121 //min

## 2015-12-21 NOTE — Telephone Encounter (Signed)
done

## 2015-12-24 NOTE — Nursing Note (Signed)
Nursing Discharge Summary - Text       Nursing Discharge Summary Entered On:  12/24/2015 18:03 EST    Performed On:  12/24/2015 18:03 EST by Neil CrouchRANE, RN, NICOLA               DC Information   Discharge To, Anticipated :   Home with family support   Mode of Discharge :   Ambulatory   Transportation :   Tommie Ardab   CRANE, RN, NICOLA - 12/24/2015 18:03 EST

## 2016-06-12 NOTE — Nursing Note (Signed)
Adult Patient History Form-Text       Adult Patient History Entered On:  06/12/2016 6:21 EDT    Performed On:  06/12/2016 6:14 EDT by Wilford Sports, RN, SHARNICE R               General Info   Preferred Name :   [name]   Admitted From :   ER   Mode of Arrival on Unit :   Stretcher   Information Given By :   Self   Primary Language :   English   Pregnancy Status :   Patient denies   Has the patient received chemotherapy or biotherapy within the last 48 hours? :   No   Is the patient currently (2-3 days) receiving radiation treatment? :   No   BOULWARE, RN, SHARNICE R - 06/12/2016 6:14 EDT   Problem History   (As Of: 06/12/2016 06:21:40 EDT)   Problems(Active)    Acute Crohn's disease (IMO  :161096 )  Name of Problem:   Acute Crohn's disease ; Recorder:   CUMMINGS, RN, KAREN M; Confirmation:   Confirmed ; Classification:   Medical ; Code:   045409 ; Contributor System:   Dietitian ; Last Updated:   12/03/2015 2:35 EST ; Life Cycle Date:   12/03/2015 ; Life Cycle Status:   Active ; Vocabulary:   IMO        Bilateral polycystic ovarian disease (SNOMED CT  :AZEGxwEmLzUcjfVFwKgAAg )  Name of Problem:   Bilateral polycystic ovarian disease ; Recorder:   CUMMINGS, RN, KAREN M; Confirmation:   Confirmed ; Classification:   Medical ; Code:   AZEGxwEmLzUcjfVFwKgAAg ; Contributor System:   Dietitian ; Last Updated:   12/03/2015 2:35 EST ; Life Cycle Date:   12/03/2015 ; Life Cycle Status:   Active ; Vocabulary:   SNOMED CT        Hypothyroid (SNOMED CT  :811914782 )  Name of Problem:   Hypothyroid ; Recorder:   Wilford Sports, RN, SHARNICE R; Confirmation:   Confirmed ; Classification:   Medical ; Code:   [id number] ; Contributor System:   PowerChart ; Last Updated:   06/12/2016 6:17 EDT ; Life Cycle Date:   06/12/2016 ; Life Cycle Status:   Active ; Vocabulary:   SNOMED CT        Pyelonephritis (SNOMED CT  :BgCTWgDjf6PNMCc3CwsLDQ )  Name of Problem:   Pyelonephritis ; Recorder:   WICKS, RN, EMMA C; Confirmation:   Confirmed ; Classification:    Medical ; Code:   BgCTWgDjf6PNMCc3CwsLDQ ; Contributor System:   Dietitian ; Last Updated:   02/18/2016 1:16 EST ; Life Cycle Date:   02/18/2016 ; Life Cycle Status:   Active ; Vocabulary:   SNOMED CT        SVT (SNOMED CT  :AZEGxwEmLzUcjQuzwKgAAg )  Name of Problem:   SVT ; Recorder:   ALLEN, RN, PAMELA D; Confirmation:   Confirmed ; Classification:   Medical ; Code:   AZEGxwEmLzUcjQuzwKgAAg ; Contributor System:   Dietitian ; Last Updated:   05/09/2016 7:52 EDT ; Life Cycle Date:   05/09/2016 ; Life Cycle Status:   Active ; Vocabulary:   SNOMED CT          Diagnoses(Active)    Dehydration  Date:   06/12/2016 ; Diagnosis Type:   Discharge ; Confirmation:   Confirmed ; Clinical Dx:   Dehydration ; Classification:   Medical ; Clinical Service:   Non-Specified ; Code:   ICD-10-CM ; Probability:  0 ; Diagnosis Code:   E86.0      Fever  Date:   06/12/2016 ; Diagnosis Type:   Reason For Visit ; Confirmation:   Complaint of ; Clinical Dx:   Fever ; Classification:   Medical ; Clinical Service:   Emergency medicine ; Code:   PNED ; Probability:   0 ; Diagnosis Code:   [id number]      Pyelonephritis  Date:   06/12/2016 ; Diagnosis Type:   Discharge ; Confirmation:   Confirmed ; Clinical Dx:   Pyelonephritis ; Classification:   Medical ; Clinical Service:   Non-Specified ; Code:   ICD-10-CM ; Probability:   0 ; Diagnosis Code:   N12      Sinus tachycardia  Date:   06/12/2016 ; Diagnosis Type:   Discharge ; Confirmation:   Confirmed ; Clinical Dx:   Sinus tachycardia ; Classification:   Medical ; Clinical Service:   Non-Specified ; Code:   ICD-10-CM ; Probability:   0 ; Diagnosis Code:   R00.0      Sore throat  Date:   06/12/2016 ; Diagnosis Type:   Discharge ; Confirmation:   Confirmed ; Clinical Dx:   Sore throat ; Classification:   Medical ; Clinical Service:   Non-Specified ; Code:   ICD-10-CM ; Probability:   0 ; Diagnosis Code:   J02.9        Family History   Family History   (As Of: 06/12/2016  06:21:41 EDT)     Allergy   (As Of: 06/12/2016 06:21:41 EDT)   Allergies (Active)   Bactrim  Estimated Onset Date:   Unspecified ; Reactions:   Hives, Shortness of breath ; Created By:   Eddie Candle, RN, KAREN M; Reaction Status:   Active ; Category:   Drug ; Substance:   Bactrim ; Type:   Allergy ; Severity:   Mild ; Updated By:   Eddie Candle RN, Lesle Chris; Reviewed Date:   06/12/2016 6:20 EDT      contrast media (perfluorocarbon-based)  Estimated Onset Date:   Unspecified ; Reactions:   Tachycardia, Shortness of breath ; Created By:   Montez Morita RN, ELIZABETH A; Reaction Status:   Active ; Category:   Drug ; Substance:   contrast media (perfluorocarbon-based) ; Type:   Allergy ; Severity:   Mild ; Updated By:   Montez Morita RN, Austin Miles; Reviewed Date:   06/12/2016 6:20 EDT      morphine  Estimated Onset Date:   Unspecified ; Reactions:   Hives, Shortness of breath ; Created By:   Montez Morita, RN, ELIZABETH A; Reaction Status:   Active ; Category:   Drug ; Substance:   morphine ; Type:   Allergy ; Severity:   Mild ; Updated By:   Montez Morita RN, Austin Miles; Reviewed Date:   06/12/2016 6:20 EDT        Immunizations   Immunizations Current :   Yes   Last Tetanus :   None received   Influenza Vaccine Status :   Received prior to admission, during current flu season   Influenza Vaccine Refused :   No   Pneumococcal Vaccine Status :   Received prior to admission   Pneumococcal Vaccine Refused :   No   BOULWARE, RN, SHARNICE R - 06/12/2016 6:14 EDT   ID Risk Screen   Patient Recent Travel History :   No recent travel   Family Member Travel History :   No recent travel   Clarinda, Charity fundraiser,  SHARNICE R - 06/12/2016 6:14 EDT   Infectious Disease Risk Factor Grid   Chills :   Yes   Fever :   Yes   Fatigue :   Yes   Headache :   Yes   Runny or Stuffy Nose :   No   Sore Throat :   Yes   Difficulty Breathing :   Yes   Shortness of Breath :   Yes   New or Worsening Cough :   Yes   Wheezing :   No   Vomiting :   Yes   Diarrhea :   Yes   Abdominal (Stomach  Pain) :   Yes   Muscle Pain :   No   Weakness/Numbness :   Yes   Recent Exposure to Communicable Disease :   No   Illness With Generalized Rash :   No   Abnormal Bleeding :   No   Unexplained Hemorrhage (Bleeding or Bruising) :   No   Arthralgia :   No   Conjunctivitis :   No   BOULWARE, RN, SHARNICE R - 06/12/2016 6:14 EDT   MRSA/VRE Screening :   <30 days from previous admission   BOULWARE, RN, SHARNICE R - 06/12/2016 6:14 EDT   C. diff Screen   Have you had 3 or more loose/watery stool in 24 hours? :   No   BOULWARE, RN, SHARNICE R - 06/12/2016 6:14 EDT   Procedure History        -    Procedure History   (As Of: 06/12/2016 06:21:41 EDT)     Anesthesia Minutes:   0 ; Procedure Name:   Wisdom tooth ; Procedure Minutes:   0            Anesthesia Minutes:   0 ; Procedure Name:   Breast augmentation ; Procedure Minutes:   0            Anesthesia Minutes:   0 ; Procedure Name:   D&C - Dilatation and curettage ; Procedure Minutes:   0            Transfusion/Bloodless Med   Transfusion History :   Unknown   Will Patient Accept Blood Transfusion and/or Blood Products :   Yes   BOULWARE, RN, SHARNICE R - 06/12/2016 6:14 EDT   Nutrition   Home Diet :   Regular   Appetite :   Poor   Unintentional Weight Change Greater Than 10 lbs in the Last 6 Months :   No   Wilford SportsBOULWARE, RN, SHARNICE R - 06/12/2016 6:14 EDT   Functional   ADLs :   Independent   Wilford SportsBOULWARE, RN, SHARNICE R - 06/12/2016 6:14 EDT   Living and Resources   Living Situation :   Home independently   Wilford SportsBOULWARE, RN, SHARNICE R - 06/12/2016 6:14 EDT   Social History   Social History   (As Of: 06/12/2016 06:21:41 EDT)   Tobacco:        Never smoker   (Last Updated: 06/12/2016 03:35:08 EDT by Iran PlanasGILMAN, RN, NANCY M)          Alcohol:        Current, 1-2 times per month   (Last Updated: 02/18/2016 01:16:17 EST by WICKS, RN, EMMA C)            Sexual Assault/Domestic Violence Screen   Have You Ever Been Emotionally or Physically Abused by Your Partner :   No   BOULWARE,  RN, SHARNICE R -  06/12/2016 6:14 EDT   Spiritual   Do You Receive Comfort From Spiritual Practices :   No   Hospital Clergy to Visit :   No   Wilford Sports RN, SHARNICE R - 06/12/2016 6:14 EDT   Advance Directive   *Advance Directive :   No   Wilford Sports, RN, SHARNICE R - 06/12/2016 6:14 EDT   Educ Needs   Barriers to Learning :   None evident   Preventative Measures Information Given :   Hand Hygiene, Respiratory Hygiene, Isolation Precautions, Environmental Safety, Pain Scale   BOULWARE, RN, SHARNICE R - 06/12/2016 6:14 EDT

## 2016-06-12 NOTE — Discharge Summary (Signed)
 Inpatient Clinical Summary             [facility]  Post-Acute Care Transfer Instructions  PERSON INFORMATION   Name: ADREONNA, YONTZ   MRN: [id number]    FIN#: [id number]   PHYSICIANS  Admitting Physician: GLADE JOEN PEDLAR  Attending Physician: GLADE JOEN PEDLAR   PCP: WHITFIELD THOM HERO  Discharge Diagnosis: Dehydration; Pyelonephritis; Sinus tachycardia; Sore throat  Comment:       PATIENT EDUCATION INFORMATION  Instructions:             BLADDER INFECTION, Female (Adult)  Medication Leaflets:               Follow-up:                          With: Address: When:   Please call Hospitalist Services at [phone] with any questions concerning discharge instructions         With: Address: When:   [facility] [address]  [place], [place]  [address]  [phone] Business (1) 06/13/2016 13:00:00                           MEDICATION LIST  Medication Reconciliation at Discharge:         New Medications  Printed Prescriptions  guaiFENesin (guaiFENesin 100 mg/5 mL oral liquid) 10 Milliliter Oral (given by mouth) every 4 hours as needed cough. Refills: 0.  Last Dose:____________________  levofloxacin (Levaquin 500 mg oral tablet) 1 Tabs Oral (given by mouth) every 24 hours for 7 Days. Refills: 0.  Last Dose:____________________  Medications That Have Not Changed  Other Medications  acetaminophen (Tylenol Regular Strength 325 mg oral tablet) 2 Tabs Oral (given by mouth) 3 times a day as needed prn., MAX DAILY DOSE OF ACETAMINOPHEN = 3000 MG  Last Dose:____________________  adalimumab (Humira Pen 40 mg/0.8 mL subcutaneous kit) 0.8 Milliliter Subcutaneous (under the skin) every other week.  Last Dose:____________________  biotin (biotin 5 mg oral capsule) 1 Capsules Oral (given by mouth) every day.  Last Dose:____________________  clonazePAM (clonazePAM 0.5 mg oral tablet) 2 Tabs Oral (given by mouth) every day as needed prn., SOUND ALIKE / LOOK ALIKE - VERIFY DRUG  Last  Dose:____________________  levothyroxine (Synthroid 88 mcg (0.088 mg) oral tablet) 1 Tabs Oral (given by mouth) once a day (in the morning).  Last Dose:____________________  ondansetron (Zofran ODT 8 mg oral tablet, disintegrating) 1 Tabs Oral (given by mouth) every 8 hours as needed prn.  Last Dose:____________________  zolpidem (Ambien 10 mg oral tablet) 1 Tabs Oral (given by mouth) Once a Day (at bedtime) as needed.,  THIS MEDICATION IS ASSOCIATED WITH AN INCREASED RISK OF FALLS.  Last Dose:____________________         Patient's Final Home Medication List Upon Discharge:          acetaminophen (Tylenol Regular Strength 325 mg oral tablet) 2 Tabs Oral (given by mouth) 3 times a day as needed prn., MAX DAILY DOSE OF ACETAMINOPHEN = 3000 MG  adalimumab (Humira Pen 40 mg/0.8 mL subcutaneous kit) 0.8 Milliliter Subcutaneous (under the skin) every other week.  biotin (biotin 5 mg oral capsule) 1 Capsules Oral (given by mouth) every day.  clonazePAM (clonazePAM 0.5 mg oral tablet) 2 Tabs Oral (given by mouth) every day as needed prn., SOUND ALIKE / LOOK ALIKE - VERIFY DRUG  guaiFENesin (guaiFENesin 100 mg/5 mL oral liquid)  10 Milliliter Oral (given by mouth) every 4 hours as needed cough. Refills: 0.  levofloxacin (Levaquin 500 mg oral tablet) 1 Tabs Oral (given by mouth) every 24 hours for 7 Days. Refills: 0.  levothyroxine (Synthroid 88 mcg (0.088 mg) oral tablet) 1 Tabs Oral (given by mouth) once a day (in the morning).  ondansetron (Zofran ODT 8 mg oral tablet, disintegrating) 1 Tabs Oral (given by mouth) every 8 hours as needed prn.  zolpidem (Ambien 10 mg oral tablet) 1 Tabs Oral (given by mouth) Once a Day (at bedtime) as needed.,  THIS MEDICATION IS ASSOCIATED WITH AN INCREASED RISK OF FALLS.         Comment:       ORDERS         Order Name Order Details   Discharge Patient 06/12/16 13:27:00 EDT

## 2016-06-12 NOTE — Case Communication (Signed)
CM Discharge Planning Assessment - Text       CM Discharge Planning Ongoing Assessment Entered On:  06/12/2016 13:46 EDT    Performed On:  06/12/2016 13:45 EDT by Tamala SerSingleton,  Monique               Discharge Needs I   Previously Documented Discharge Needs :   DISCHARGE PLAN/NEEDS:  Discharge To, Anticipated: Home independently - Tamala SerSingleton,  Monique - 06/12/16 11:03:00  Needs Assistance with Transportation: No Tamala Ser- Singleton,  Monique - 06/12/16 11:03:00  EQUIPMENT/TREATMENT NEEDS:  Needs Assistance at Home Upon Discharge: No Tamala Ser- Singleton,  Monique - 06/12/16 11:03:00  Professional Skilled Services: No Needs - Tamala SerSingleton,  Monique - 06/12/16 11:03:00       Previously Documented Benefits Information :   Performed By: Tamala SerSingleton,  Monique  - 06/12/16 11:03:00       Anticipated Discharge Date :   06/12/2016 EDT   Anticipated Discharge Time Slot :   1200-1400   Discharge To :   Home independently   Home Caregiver Name/Relationship :   Nelson ChimesRyan Barnes   Home Caregiver Phone Number :   [phone]   CM Progress Note :   06/12/16 MS: Pt admitted after presenting to the ED with fever. Pt lives alone in an apt and is able to return home at dc. Pt was independent prior to admission and drove herself to the hospitial. Pt advised she or a friend would drive her home at dc. [facility] and advised she followed up with her PCP as ordered. CM services were described and brochure provided. Pt will dc today, once lab results are reviewed.   Update: Pt medically cleared for dc today. No CM needs noted, per nurse Shanda BumpsJessica.      Tamala SerSingleton,  Monique - 06/12/2016 13:45 EDT   Discharge Needs II   Professional Skilled Services :   No Needs   Needs Assistance with Transportation :   No   Needs Assistance at Home Upon Discharge :   No   Discharge Planning Time Spent :   5 minutes   Tamala SerSingleton,  Monique - 06/12/2016 13:45 EDT   Discharge Planning   Discharge Arrangements :       Patient Post-Acute Information    Patient  Name: [name], [name]  MRN: [id number]  FIN: [id number]  Gender: Female  DOB: April 11, 1986  Age:  29 Years        *** No Post-Acute Placement(s) Listed ***          *** No Post-Acute Service(s) Listed ***       Patient Choices Made :   Home     Discharge Plan Discussion :   Discussed with patient, Patient agrees with plan   Barriers to Discharge Identified :   None identified   Tamala SerSingleton,  Monique - 06/12/2016 13:45 EDT   Readmission Report   Readmission Report :   Low   Tamala SerSingleton,  Monique - 06/12/2016 13:45 EDT

## 2016-06-12 NOTE — Discharge Summary (Signed)
 Inpatient Patient Summary       ;        [facility]  [address]  [place], [place] [address]  [phone]  Patient Discharge Instructions     Name: [name]  Current Date: 06/12/2016 14:12:43  DOB: [birth year] MRN: [id number] FIN: WAM%>[id number]  Patient Address: [address] [place] [place] [address]  Patient Phone: [phone]  Primary Care Provider:  Name: WHITFIELD THOM HERO  Phone: [phone]   Immunizations Provided:       Discharge Diagnosis: Dehydration; Pyelonephritis; Sinus tachycardia; Sore throat  Discharged To: TO, ANTICIPATED%>Home independently  Home Treatments: TREATMENTS, ANTICIPATED%>  Devices/Equipment: EQUIPMENT REHAB%>  Post Hospital Services: HOSPITAL SERVICES%>  Professional Skilled Services: SKILLED SERVICES%>  Special Services and Community Resources:                SERV AND COMM RES, ANTICIPATED%>  Mode of Discharge Transportation: TRANSPORTATION%>  Discharge Orders         Discharge Patient 06/12/16 13:27:00 EDT         Comment:      Medications   During the course of your visit, your medication list was updated with the most current information. The details of those changes are reflected below:         New Medications  Printed Prescriptions  guaiFENesin (guaiFENesin 100 mg/5 mL oral liquid) 10 Milliliter Oral (given by mouth) every 4 hours as needed cough. Refills: 0.  Last Dose:____________________  levofloxacin (Levaquin 500 mg oral tablet) 1 Tabs Oral (given by mouth) every 24 hours for 7 Days. Refills: 0.  Last Dose:____________________  Medications That Have Not Changed  Other Medications  acetaminophen (Tylenol Regular Strength 325 mg oral tablet) 2 Tabs Oral (given by mouth) 3 times a day as needed prn., MAX DAILY DOSE OF ACETAMINOPHEN = 3000 MG  Last Dose:____________________  adalimumab (Humira Pen 40 mg/0.8 mL subcutaneous kit) 0.8 Milliliter Subcutaneous (under the skin) every other week.  Last Dose:____________________  biotin (biotin 5 mg oral capsule) 1  Capsules Oral (given by mouth) every day.  Last Dose:____________________  clonazePAM (clonazePAM 0.5 mg oral tablet) 2 Tabs Oral (given by mouth) every day as needed prn., SOUND ALIKE / LOOK ALIKE - VERIFY DRUG  Last Dose:____________________  levothyroxine (Synthroid 88 mcg (0.088 mg) oral tablet) 1 Tabs Oral (given by mouth) once a day (in the morning).  Last Dose:____________________  ondansetron (Zofran ODT 8 mg oral tablet, disintegrating) 1 Tabs Oral (given by mouth) every 8 hours as needed prn.  Last Dose:____________________  zolpidem (Ambien 10 mg oral tablet) 1 Tabs Oral (given by mouth) Once a Day (at bedtime) as needed.,  THIS MEDICATION IS ASSOCIATED WITH AN INCREASED RISK OF FALLS.  Last Dose:____________________         [facility] would like to thank you for allowing us  to assist you with your healthcare needs. The following includes patient education materials and information regarding your injury/illness.     [name], [name] has been given the following list of follow-up instructions, prescriptions, and patient education materials:  Follow-up Instructions             With: Address: When:   Please call Hospitalist Services at [phone] with any questions concerning discharge instructions         With: Address: When:   [facility] [address]  [place], [place]  [address]  [phone] Business (1) 06/13/2016 13:00:00  It is important to always keep an active list of medications available so that you can share with other providers and manage your medications appropriately. As an additional courtesy, we are also providing you with your final active medications list that you can keep with you.           acetaminophen (Tylenol Regular Strength 325 mg oral tablet) 2 Tabs Oral (given by mouth) 3 times a day as needed prn., MAX DAILY DOSE OF ACETAMINOPHEN = 3000 MG  adalimumab (Humira Pen 40 mg/0.8 mL subcutaneous kit) 0.8 Milliliter Subcutaneous (under the skin) every  other week.  biotin (biotin 5 mg oral capsule) 1 Capsules Oral (given by mouth) every day.  clonazePAM (clonazePAM 0.5 mg oral tablet) 2 Tabs Oral (given by mouth) every day as needed prn., SOUND ALIKE / LOOK ALIKE - VERIFY DRUG  guaiFENesin (guaiFENesin 100 mg/5 mL oral liquid) 10 Milliliter Oral (given by mouth) every 4 hours as needed cough. Refills: 0.  levofloxacin (Levaquin 500 mg oral tablet) 1 Tabs Oral (given by mouth) every 24 hours for 7 Days. Refills: 0.  levothyroxine (Synthroid 88 mcg (0.088 mg) oral tablet) 1 Tabs Oral (given by mouth) once a day (in the morning).  ondansetron (Zofran ODT 8 mg oral tablet, disintegrating) 1 Tabs Oral (given by mouth) every 8 hours as needed prn.  zolpidem (Ambien 10 mg oral tablet) 1 Tabs Oral (given by mouth) Once a Day (at bedtime) as needed.,  THIS MEDICATION IS ASSOCIATED WITH AN INCREASED RISK OF FALLS.      Take only the medications listed above. Contact your doctor prior to taking any medications not on this list.        Discharge instructions, if any, will display below     Instructions for Diet: INSTRUCTIONS FOR DIET%>   Instructions for Supplements: SUPPLEMENT INSTRUCTIONS%>   Instructions for Activity: INSTRUCTIONS FOR ACTIVITY%>   Instructions for Wound Care: INSTRUCTIONS FOR WOUND CARE%>     Medication leaflets, if any, will display below         Patient education materials, if any, will display below        Bladder Infection, Female (Adult)      Normally, bacteria do not stay in the urine. But, when they do, the urine can become infected. This is called a urinary tract infection, or UTI. An infection can occur anywhere in the urinary tract, from the kidney to the bladder and urethra. The most common place for a UTI is in the bladder. This is called a bladder infection. This is one of the most common infections in women. Most bladder infections are easily treated, and are not serious unless the infection spreads up to the kidney.   The phrases bladder  infection, UTI, and cystitis, are often used to describe the same thing, but they are not always the same. Cystitis is an inflammation of the bladder. The  most common cause of cystitis is an infection.   In summary:    Infections in the urine are called UTIs.    Cystitis is usually caused by a UTI.    Not al UTIs and cases of cystitis are bladder infections.    Bladder infections are the most common type of cystitis.   Symptoms   The infection causes inflammation in the urethra and bladder, which causes many of the symptoms. The most common symptoms of a bladder infection are:    Pain or burning when urinating    Having to urinate more often  than usual    Urgent need to urinate    Only a small amount of urine comes out    Blood in urine    Abdominal discomfort, usually in the lower abdomen, above the pubic bone    Cloudy, strong, or bad smelling urine    Urinary retention, being unable to urinate    Urinary incontinence (being unable to hold urine in)    Fever    Loss of appetite    Confusion (in older adults)   Causes   Bladder infections are not contagious. You can't get one from someone else, from a toilet seat, or from sharing a bath.   The most common cause of bladder infections is bacteria from the bowels. The bacteria get onto the skin around the opening of the urethra. From there it can get into the urine and travel up to the bladder, causing inflammation and infection. This usually happens because of:    Wiping improperly after urinating (always wipe from front to back)    Bowel incontinence    Pregnancy    Procedures such as having a catheter inserted    Older age    Not emptying your bladder (stagnated urine gives bacteria a chance to grow)    Dehydration    Constipation    Sex    Use of a diaphragm for birth control       Treatment   Bladder infections are diagnosed by a urine test. They are treated with antibiotics and usually clear up quickly without complications.  Treatment helps prevent a more serious kidney infection.   Medicines   Medicines can help in the treatment of a bladder infection:    Take antibiotics until they are used up, even if you feel better. It is important to finish them to make sure the infection has cleared.    You can use acetaminophen or ibuprofen for pain, fever, or discomfort, unless another medicine was prescribed. You can also alternate them, or use both together. They work differently and are a diferent class of medicines, so taking them together is not an overdose. If you have chronic liver or kidney disease or ever had a stomach ulcer or gastrointestinal bleeding, or are taking blood thinner medications, talk with your doctor before using these medicines.    If you are given Pyridium (phenazopydridine) to reduce burning with urination, it will cause your urine to become a bright orange color, which can stain clothing.   Care and prevention   These self care steps can help prevent future infections:    Drink plenty of fluids to prevent dehydration and flush out of the bladder, unless you must restrict fluids for other medical reasons, or your doctor told you not to.    Proper cleaning after going to the bathroom is important. Wipe from front to back after using the toilet to prevent the spread of bacteria.    Urinate more frequently, and don't try to hold urine in for a long time.    Wear loose fitting clothes and cotton underwear. Avoid tight fitting pans.    Improve your diet and prevent constipation. Eat more fresh fruit and vegetables, more fiber, less junk and fatty foods.    Avoid sexual intercourse until your symptoms are gone.    Avoid caffeine, alcohol, and spicy foods. These can irritate thebladder.    Urinate right after intercourse to flush out the bladder.    If you use birth control pills and have frequent bladder infections,  discuss it with your doctor.   Follow-up care   Return to this facility or see your doctor if  all symptoms are not gone after 3 days of treatment. This is especially important if you have repeat infections.   If a culture was done, you will be told if your treatment needs to be changed. If directed, you can call to find out the results.   If X-rays were done, you will be told if the results will affect your treatment.   Call 911   Call emergency services if any of the following occur:    Trouble breathing    Difficulty arousing, confusion    Fainting or loss of consciousness    Rapid heart rate   When to seek medical care   Get prompt medical attention if any of the following occur:    Fever of 100.27F (38.0C) or higher, or as directed by your health care provider    No improvement by the third day of treatment    Increasing back or abdominal pain    Repeated vomiting; unable to keep medicine down    Weakness or dizziness    Vaginal discharge    Pain, redness or swelling in the labia (outer vaginal area)      2000-2015 [employer] [address], [place], [place] [address]. All rights reserved. This information is not intended as a substitute for professional medical care. Always follow your healthcare professional's instructions.               IS IT A STROKE?  Act FAST and Check for these signs:     FACE                  Does the face look uneven?     ARM                    Does one arm drift down?     SPEECH             Does their speech sound strange?     TIME                   Call 9-1-1 at any sign of stroke  Heart Attack Signs  Chest discomfort: Most heart attacks involve discomfort in the center of the chest and lasts more than a few minutes, or goes away and comes back. It can feel like uncomfortable pressure, squeezing, fullness or pain.  Discomfort in upper body: Symptoms can include pain or discomfort in one or both arms, back, neck, jaw or stomach.  Shortness of breath: With or without discomfort.  Other signs: Breaking out in a cold sweat, nausea, or  lightheaded.  Remember, MINUTES DO MATTER. If you experience any of these heart attack warning signs, call 9-1-1 to get immediate medical attention!             Yes - Patient/Family/Caregiver demonstrates understanding of instructions given  ______________________________ ___________ ___________________ ___________  Patient/Family/ Caregiver Signature Date/Time          Provider Signature Date/Time

## 2016-06-12 NOTE — Case Communication (Signed)
CM Discharge Planning Assessment - Text       CM Discharge Planning Assessment Entered On:  06/12/2016 13:45 EDT    Performed On:  06/12/2016 11:03 EDT by Tamala SerSingleton,  Monique               Home Environment   Living Environment :   No Living Environment Information Available     Affect/Behavior :   Appropriate, Calm, Cooperative   Lives With :   Alone   Lives In :   Apartment   Tamala SerSingleton,  Monique - 06/12/2016 13:37 EDT   Rehabilitation Stairs Grid     Outside Stairs          Number of Stairs :    16               Rail :    Bilateral                Tamala SerSingleton,  Monique - 06/12/2016 13:37 EDT         Living Situation :   Home independently   Job Responsibilities :   Unemployed   Key Contact Information :   PCP - Leanora IvanoffJohn McGough  RX - [facility]([place])   Barriers at Home :   None   Tamala SerSingleton,  Monique - 06/12/2016 13:37 EDT   Home Environment   ADLs :   Independent   Tamala SerSingleton,  Monique - 06/12/2016 13:37 EDT   Discharge Needs I   Previously Documented Discharge Needs :   DISCHARGE PLAN/NEEDS:No discharge data available.  EQUIPMENT/TREATMENT NEEDS:No discharge data available.     Previously Documented Benefits Information :   No discharge data available.     Discharge To :   Home independently   Home Caregiver Name/Relationship :   Nelson ChimesRyan Barnes   Home Caregiver Phone Number :   [phone]   CM Progress Note :   06/12/16 MS: Pt admitted after presenting to the ED with fever. Pt lives alone in an apt and is able to return home at dc. Pt was independent prior to admission and drove herself to the hospitial. Pt advised she or a friend would drive her home at dc. Pt [facility] for [facility] and advised she followed up with her PCP as ordered. CM services were described and brochure provided. Pt will dc today, once lab results are reviewed.      Tamala SerSingleton,  Monique - 06/12/2016 13:37 EDT   Discharge Needs II   Professional Skilled Services :   No Needs   Needs Assistance with Transportation :   No    Needs Assistance at Home Upon Discharge :   No   Discharge Planning Time Spent :   15 minutes   Tamala SerSingleton,  Monique - 06/12/2016 13:37 EDT   Benefits   Insurance Information :   Private Insurance   Tamala SerSingleton,  Monique - 06/12/2016 13:37 EDT   Advance Directive   *Advance Directive :   No   Patient Wishes to Receive Further Information on Advance Directives :   No   Tamala SerSingleton,  Monique - 06/12/2016 13:37 EDT

## 2016-06-12 NOTE — Progress Notes (Signed)
Chaplaincy Note - Text       Chaplaincy Note Entered On:  06/12/2016 15:05 EDT    Performed On:  06/12/2016 13:55 EDT by Jena GaussMAXWELL,  LEON               Chaplaincy Consult   Faith/Denomination :   Other: I did not assessed.   Importance of Faith :   Not assessed   Religious Spiritual Resources :   Acceptance of illness, Hope   Religious Spiritual Practices :   Other: The Pt was not interested in a spiritual assessment.   Religious Spiritual Concerns :   Anxiety   Additional Information, Chaplaincy :   The Pt was polite but looking forward to being discharged today.   Hezzie BumpMAXWELL,  LEON - 06/12/2016 15:04 EDT

## 2016-06-12 NOTE — Nursing Note (Signed)
Basic Admission Information - Text       Basic Admission Information Adult Entered On:  06/12/2016 6:13 EDT    Performed On:  06/12/2016 6:05 EDT by Wilford SportsBOULWARE, RN, SHARNICE R               Vital Signs   Temperature Oral :   36.4 degC   Peripheral Pulse Rate :   96 bpm   Respiratory Rate :   18 br/min   Systolic/ Diastolic BP :   109 mmHg   Diastolic Blood Pressure :   80 mmHg   SpO2 :   96 %   O2 Therapy :   Room air   BOULWARE, RN, SHARNICE R - 06/12/2016 6:11 EDT   Height/Weight   Height/Length Measured :   154.9 cm(Converted to: 5 ft 1 in, 5.08 ft, 60.98 in)    Ideal Body Weight Calculated :   47.764 kg   BOULWARE, RN, SHARNICE R - 06/12/2016 6:11 EDT   Safety   Environmental Safety in Place :   Adequate room lighting, Attend patient with toileting, Bed exit alarm, Bed in low position, Call device within reach, Fall risk magnet on door, Non-slip footwear, Personal items within reach, Wheels locked   Patient Identified :   Identification band   Room Orientation/Facility Policy Reviewed With :   Patient   Wilford SportsBOULWARE, RN, SHARNICE R - 06/12/2016 6:11 EDT

## 2016-06-12 NOTE — Nursing Note (Signed)
Nursing Discharge Summary - Text       Nursing Discharge Summary Entered On:  06/12/2016 14:43 EDT    Performed On:  06/12/2016 14:41 EDT by Joelene MillinLIVER, RN, JESSICA L               DC Information   Discharge To, Anticipated :   Home independently   Joelene MillinOLIVER, RN, JESSICA L - 06/12/2016 14:41 EDT   Education   Responsible Learner(s) :   Living Situation: Home independently        Performed by: Tamala SerSingleton,  Monique - 06/12/2016 11:03  Discharge To: Home independently        Performed by: Tamala SerSingleton,  Monique - 06/12/2016 13:45  Home Caregiver Name/Relationship: Nelson Chimesyan Barnes        Performed by: Tamala SerSingleton,  Monique - 06/12/2016 13:45  Home Caregiver Phone Number: [phone]        Performed by: Tamala SerSingleton,  Monique - 06/12/2016 13:45     Home Caregiver Present for Session :   Yes   Barriers To Learning :   None evident   Teaching Method :   Demonstration, Explanation, Printed materials, Teach-back   OLIVER, RN, JESSICA L - 06/12/2016 14:41 EDT   Post-Hospital Education Adult Grid   Activity Expectations :   Verbalizes understanding   Bladder Management :   Verbalizes understanding   Diagnostic Results :   Verbalizes understanding   Importance of Follow-Up Visits :   Verbalizes understanding   Pain Management :   Verbalizes understanding   Plan of Care :   Verbalizes understanding   Joelene MillinOLIVER, RN, Joyce CopaJESSICA L - 06/12/2016 14:41 EDT   Health Maintenance Education Adult Grid   Bathing/Hygiene :   Verbalizes understanding   OLIVER, RN, JESSICA L - 06/12/2016 14:41 EDT   Medication Education Adult Grid   Med Generic/Brand Name, Purpose, Action :   Verbalizes understanding   Med Preadministration Procedures :   Bristol-Myers SquibbVerbalizes understanding   Medication Precautions :   IT sales professionalVerbalizes understanding   Safety, Medication :   Verbalizes understanding   OLIVER, RN, JESSICA L - 06/12/2016 14:41 EDT   Time Spent Educating Patient :   15 minutes   OLIVER, RN, JESSICA L - 06/12/2016 14:41 EDT

## 2016-10-22 ENCOUNTER — Observation Stay
Admission: EM | Admit: 2016-10-22 | Discharge: 2016-10-24 | Disposition: A | Discharge status: Home / Self Care | Payer: BC Managed Care – PPO | Attending: Adult Medicine | Admitting: Adult Medicine

## 2016-10-22 ENCOUNTER — Observation Stay: Payer: BC Managed Care – PPO | Admitting: Internal Medicine

## 2016-10-22 DIAGNOSIS — E282 Polycystic ovarian syndrome: Secondary | ICD-10-CM | POA: Insufficient documentation

## 2016-10-22 DIAGNOSIS — I471 Supraventricular tachycardia: Secondary | ICD-10-CM | POA: Insufficient documentation

## 2016-10-22 DIAGNOSIS — E039 Hypothyroidism, unspecified: Secondary | ICD-10-CM

## 2016-10-22 DIAGNOSIS — R1030 Lower abdominal pain, unspecified: Principal | ICD-10-CM | POA: Insufficient documentation

## 2016-10-22 DIAGNOSIS — Z87440 Personal history of urinary (tract) infections: Secondary | ICD-10-CM | POA: Insufficient documentation

## 2016-10-22 DIAGNOSIS — Z9882 Breast implant status: Secondary | ICD-10-CM | POA: Insufficient documentation

## 2016-10-22 DIAGNOSIS — E063 Autoimmune thyroiditis: Secondary | ICD-10-CM | POA: Insufficient documentation

## 2016-10-22 DIAGNOSIS — R1110 Vomiting, unspecified: Secondary | ICD-10-CM

## 2016-10-22 DIAGNOSIS — Z79891 Long term (current) use of opiate analgesic: Secondary | ICD-10-CM | POA: Insufficient documentation

## 2016-10-22 DIAGNOSIS — R1084 Generalized abdominal pain: Secondary | ICD-10-CM

## 2016-10-22 DIAGNOSIS — R109 Unspecified abdominal pain: Secondary | ICD-10-CM

## 2016-10-22 DIAGNOSIS — D649 Anemia, unspecified: Secondary | ICD-10-CM | POA: Insufficient documentation

## 2016-10-22 DIAGNOSIS — Z882 Allergy status to sulfonamides status: Secondary | ICD-10-CM | POA: Insufficient documentation

## 2016-10-22 DIAGNOSIS — K5090 Crohn's disease, unspecified, without complications: Secondary | ICD-10-CM | POA: Insufficient documentation

## 2016-10-22 DIAGNOSIS — Z8701 Personal history of pneumonia (recurrent): Secondary | ICD-10-CM | POA: Insufficient documentation

## 2016-10-22 DIAGNOSIS — R197 Diarrhea, unspecified: Secondary | ICD-10-CM | POA: Insufficient documentation

## 2016-10-22 DIAGNOSIS — F419 Anxiety disorder, unspecified: Secondary | ICD-10-CM | POA: Insufficient documentation

## 2016-10-22 DIAGNOSIS — N390 Urinary tract infection, site not specified: Secondary | ICD-10-CM | POA: Insufficient documentation

## 2016-10-22 DIAGNOSIS — Z885 Allergy status to narcotic agent status: Secondary | ICD-10-CM | POA: Insufficient documentation

## 2016-10-22 DIAGNOSIS — R112 Nausea with vomiting, unspecified: Secondary | ICD-10-CM | POA: Insufficient documentation

## 2016-10-22 DIAGNOSIS — Z79899 Other long term (current) drug therapy: Secondary | ICD-10-CM | POA: Insufficient documentation

## 2016-10-22 HISTORY — DX: Polycystic ovarian syndrome: E28.2

## 2016-10-22 LAB — COMPREHENSIVE METABOLIC PANEL
ALT: 19 U/L (ref 0–55)
AST (SGOT): 23 U/L (ref 10–42)
Albumin/Globulin Ratio: 0.98 Ratio (ref 0.70–1.50)
Albumin: 4.3 gm/dL (ref 3.5–5.0)
Alkaline Phosphatase: 64 U/L (ref 40–145)
Anion Gap: 15.3 mMol/L (ref 7.0–18.0)
BUN / Creatinine Ratio: 12.2 Ratio (ref 10.0–30.0)
BUN: 10 mg/dL (ref 7–22)
Bilirubin, Total: 0.6 mg/dL (ref 0.1–1.2)
CO2: 24.3 mMol/L (ref 20.0–30.0)
Calcium: 10.1 mg/dL (ref 8.5–10.5)
Chloride: 105 mMol/L (ref 98–110)
Creatinine: 0.82 mg/dL (ref 0.60–1.20)
EGFR: 97 mL/min/{1.73_m2} (ref 60–150)
Globulin: 4.4 gm/dL — ABNORMAL HIGH (ref 2.0–4.0)
Glucose: 92 mg/dL (ref 70–99)
Osmolality Calc: 280 mOsm/kg (ref 275–300)
Potassium: 3.6 mMol/L (ref 3.5–5.3)
Protein, Total: 8.7 gm/dL — ABNORMAL HIGH (ref 6.0–8.3)
Sodium: 141 mMol/L (ref 136–147)

## 2016-10-22 LAB — VH URINALYSIS WITH MICROSCOPIC AND CULTURE IF INDICATED
Bilirubin, UA: NEGATIVE
Blood, UA: NEGATIVE
Glucose, UA: NEGATIVE mg/dL
Ketones UA: 20 mg/dL — AB
Leukocyte Esterase, UA: 25 Leu/uL — AB
Nitrite, UA: NEGATIVE
Protein, UR: 100 mg/dL — AB
RBC, UA: 4 /hpf (ref 0–5)
Squam Epithel, UA: 5 /hpf — ABNORMAL HIGH (ref 0–2)
Urine Specific Gravity: 1.024 (ref 1.001–1.040)
Urobilinogen, UA: NORMAL mg/dL
WBC, UA: 12 /hpf — ABNORMAL HIGH (ref 0–4)
pH, Urine: 6 pH (ref 5.0–8.0)

## 2016-10-22 LAB — CBC AND DIFFERENTIAL
Basophils %: 1 % (ref 0.0–3.0)
Basophils Absolute: 0.1 10*3/uL (ref 0.0–0.3)
Eosinophils %: 0.1 % (ref 0.0–7.0)
Eosinophils Absolute: 0 10*3/uL (ref 0.0–0.8)
Hematocrit: 33.9 % — ABNORMAL LOW (ref 36.0–48.0)
Hemoglobin: 11 gm/dL — ABNORMAL LOW (ref 12.0–16.0)
Lymphocytes Absolute: 2.3 10*3/uL (ref 0.6–5.1)
Lymphocytes: 33.5 % (ref 15.0–46.0)
MCH: 28 pg (ref 28–35)
MCHC: 33 gm/dL (ref 32–36)
MCV: 87 fL (ref 80–100)
MPV: 6.4 fL (ref 6.0–10.0)
Monocytes Absolute: 0.7 10*3/uL (ref 0.1–1.7)
Monocytes: 9.7 % (ref 3.0–15.0)
Neutrophils %: 55.8 % (ref 42.0–78.0)
Neutrophils Absolute: 3.8 10*3/uL (ref 1.7–8.6)
PLT CT: 442 10*3/uL — ABNORMAL HIGH (ref 130–440)
RBC: 3.89 10*6/uL (ref 3.80–5.00)
RDW: 13.6 % (ref 11.0–14.0)
WBC: 6.8 10*3/uL (ref 4.0–11.0)

## 2016-10-22 LAB — HCG, SERUM, QUALITATIVE: BHCG Qualitative: NEGATIVE

## 2016-10-22 LAB — LIPASE: Lipase: <4 U/L — ABNORMAL LOW (ref 8–78)

## 2016-10-22 MED ORDER — SODIUM CHLORIDE 0.9 % IV BOLUS
1000.0000 mL | Freq: Once | INTRAVENOUS | Status: AC
Start: 2016-10-22 — End: 2016-10-22
  Administered 2016-10-22: 1000 mL via INTRAVENOUS

## 2016-10-22 MED ORDER — PROMETHAZINE HCL 25 MG/ML IJ SOLN
INTRAMUSCULAR | Status: AC
Start: 2016-10-22 — End: ?
  Filled 2016-10-22: qty 1

## 2016-10-22 MED ORDER — ONDANSETRON HCL 4 MG/2ML IJ SOLN
4.0000 mg | Freq: Once | INTRAMUSCULAR | Status: AC
Start: 2016-10-22 — End: 2016-10-22
  Administered 2016-10-22: 4 mg via INTRAVENOUS

## 2016-10-22 MED ORDER — HYDROMORPHONE HCL 0.5 MG/0.5 ML IJ SOLN
0.5000 mg | Freq: Once | INTRAMUSCULAR | Status: AC
Start: 2016-10-22 — End: 2016-10-22
  Administered 2016-10-22: 0.5 mg via INTRAVENOUS

## 2016-10-22 MED ORDER — ONDANSETRON HCL 4 MG/2ML IJ SOLN
INTRAMUSCULAR | Status: AC
Start: 2016-10-22 — End: ?
  Filled 2016-10-22: qty 2

## 2016-10-22 MED ORDER — HYDROMORPHONE HCL 0.5 MG/0.5 ML IJ SOLN
INTRAMUSCULAR | Status: AC
Start: 2016-10-22 — End: ?
  Filled 2016-10-22: qty 0.5

## 2016-10-22 MED ORDER — PROMETHAZINE HCL 25 MG/ML IJ SOLN
12.5000 mg | Freq: Once | INTRAMUSCULAR | Status: AC
Start: 2016-10-22 — End: 2016-10-22
  Administered 2016-10-22: 12.5 mg via INTRAVENOUS

## 2016-10-22 NOTE — ED Notes (Signed)
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 Pt has left sided abdominal pain with nausea/vomiting/diarrhea. Per pt she feels her pain increased today. Intermittent fever x 1 day, as high as 100 per pt.

## 2016-10-22 NOTE — ED Provider Notes (Signed)
EMERGENCY DEPARTMENT  History and Physical Exam       Patient Name: [name], [name]  Encounter Date:  10/22/2016  Attending Physician: Rosalyn Gess. Grace Isaac, M.D.  PCP: Dia Crawford Elenore Paddy, NP  Patient DOB:  09-06-1986  MRN:  [id number]  Room:  C12/C12-A    Medical Decision Making     Patient exam concerning for acute Crohn's flare. Vomiting, diarrhea, no bloody stools, diffuse abdominal pain. She has a history of Prilosec is not having any dysuria. She's been taking her medications. They're trying to keep her off steroids. She reports intermittent fevers.    6. Uncomfortable and physical exam. While in the room, her heart rate bouncing around 120 while she is laying there. She is Artie, received a liter fluid. Her mouth is somewhat dry. Neck supple. Heart regular. Lungs are clear. Abdomen is soft but she is diffusely tender. Bowel sounds are present. No tympanitic bowel sounds. She is moving all 70. Skin warm, dry without rash. Neurologically no focal deficits.        ED Course      Patient's given 2 L fluid. Given her Dilaudid and Zofran.    White count is normal. She is mildly anemic. CMP is unremarkable. Lipase is normal. Pregnancy test is negative. Urine is still pending at 9:20 PM.    Urine results pending at 11:06 PM.  Patient being moved to CDU.    In addition to the above history, please see nursing notes. Allergies, meds, past medical, family, social hx, and the results of the diagnostic studies performed have been reviewed by myself.         Diagnosis / Disposition     Clinical Impression  1. Acute abdominal pain    2. Vomiting, intractability of vomiting not specified, presence of nausea not specified, unspecified vomiting type    3. Diarrhea, unspecified type    4. Exacerbation of Crohn's disease, without complications        Disposition  ED Disposition     ED Disposition Condition Date/Time Comment    Admit  Wed Oct 22, 2016  9:24 PM             Follow up for Discharged Patients  No follow-up provider  specified.    Prescriptions for Discharged Patients  New Prescriptions    No medications on file                   History of Presenting Illness     Chief complaint: Abdominal Pain    HPI/ROS is limited by: none  HPI/ROS given by: patient    [name] is a 30 y.o. female who presents to the ED with complaint of abdominal pain. Patient reports that she has been experiencing nausea, vomiting, diarrhea, and severe abdominal pain. Patient has a history of Crohn's. She has a history of Pilo. She denies dysuria. Patient has been taking her medications.       Review of Systems   Constitutional: No fever.  No chills.  GI: + nausea.  + vomiting.  + diarrhea. No bright red blood/melena per rectum. + abdominal pain.   Ears:  No ear pain.  Nose:  No congestion.  No discharge  Throat:  No sore throat.  No difficulty swallowing.  Cardiovascular: No chest pain.  No palpitations.  Respiratory: No cough.  No shortness of breath.  GU:  No dysuria. No hematuria.   Neurological:  No headache.  No weakness.  Musculoskeletal:  No pain.  Skin:  No rash.  No skin lesions.  Psychiatric:  No depression.  No anxiety.      All other systems reviewed and negative except as above, pertinent findings in HPI.     Allergies & Medications     Pt is allergic to morphine; bactrim [sulfamethoxazole w/trimethoprim (co-trimoxazole)]; and sulfa antibiotics.    Current/Home Medications    ADALIMUMAB (HUMIRA PEN) 40 MG/0.8ML PEN-INJECTOR KIT    Inject 40 mg into the skin every 14 (fourteen) days.    ALPRAZOLAM (XANAX PO)    Take 0.5 mg by mouth as needed.       AZITHROMYCIN (ZITHROMAX) 250 MG TABLET    Take 2 pills on the first day and then one each day    BUDESONIDE (ENTOCORT EC) 3 MG 24 HR CAPSULE    Take 2 capsules (6 mg total) by mouth every morning.    CEFDINIR (OMNICEF) 300 MG CAPSULE    Take 1 capsule (300 mg total) by mouth 2 (two) times daily.    CERTOLIZUMAB PEGOL (CIMZIA SC)    Inject into the skin.    CLONAZEPAM (KLONOPIN) 1 MG TABLET     Take 1 mg by mouth daily.    DICYCLOMINE HCL (BENTYL PO)    Take by mouth as needed.    FISH OIL-OMEGA-3 FATTY ACIDS 1000 MG CAPSULE    Take 2 g by mouth daily.    HYDROCODONE-ACETAMINOPHEN (NORCO) 5-325 MG PER TABLET    Take 1-2 tablets by mouth every 6 (six) hours as needed for Pain.    HYDROCODONE-CHLORPHENIRAMINE (TUSSIONEX PENNKINETIC ER) 10-8 MG/5ML SUSPENSION    Take 5 mLs by mouth 2 (two) times daily.    IBUPROFEN (MOTRIN PO)    Take by mouth as needed.       LEVOTHYROXINE (SYNTHROID, LEVOTHROID) 88 MCG TABLET    Take 88 mcg by mouth daily.    MULTIPLE VITAMINS-MINERALS (HAIR/SKIN/NAILS) TAB    Take 1 Tab by Mouth Once a Day. Indications: Vitamin Deficiency    NORELGESTROMIN-ETHINYL ESTRADIOL (ORTHO EVRA) 150-35 MCG/24HR    Place 1 patch onto the skin once a week.    ONDANSETRON (ZOFRAN ODT) 4 MG DISINTEGRATING TABLET    Take 1 tablet (4 mg total) by mouth every 8 (eight) hours as needed for Nausea.    ONDANSETRON (ZOFRAN ODT) 4 MG DISINTEGRATING TABLET    Take 1 tablet (4 mg total) by mouth every 8 (eight) hours as needed for Nausea.    OXYCODONE-ACETAMINOPHEN (PERCOCET) 5-325 MG PER TABLET    1-2 tablets by mouth every 4-6 hours as needed for pain;  Do not drive or operate machinery while taking this medicine    OXYCODONE-ACETAMINOPHEN (PERCOCET) 5-325 MG PER TABLET    1-2 tablets by mouth every 4-6 hours as needed for pain;  Do not drive or operate machinery while taking this medicine    ZOLPIDEM TARTRATE (AMBIEN PO)    Take 10 mg by mouth nightly as needed.           Past Medical History     Pt   Past Medical History:   Diagnosis Date   . Abortion    . Anxiety    . Crohn's disease    . H/O Hashimoto thyroiditis     hypothyroidism   . PCOS (polycystic ovarian syndrome)    . Tachycardia         Past Surgical History     Pt   Past Surgical History:   Procedure Laterality Date   .  BREAST IMPLANT     . COLONOSCOPY     . D & C, SUCTION N/A 08/24/2014    Procedure: D & C, SUCTION;  Surgeon: Maryruth Hancock, MD;   Location: [facility];  Service: Obstetrics;  Laterality: N/A;   . INDUCED ABORTION          Family History     The family history is not on file.     Social History     Pt reports that she has never smoked. She has never used smokeless tobacco. She reports that she drinks alcohol. She reports that she does not use drugs.     Physical Exam     Blood pressure 124/80, pulse 96, temperature 98.9 F (37.2 C), temperature source Oral, resp. rate 18, height 1.549 m, weight (!) 44.3 kg, last menstrual period 09/28/2016, SpO2 92 %, currently breastfeeding.    Vitals signs reviewed.    Constitutional:  Uncomfortable-appearing.  Well-nourished. No acute distress.  Head:  Atraumatic, normocephalic  Eyes:  Pupils equal, and round.  Conjunctiva clear. No injection.  Nose:   No discharge.   Throat:  Oropharynx somewhat dry.    Neck:  Supple, non tender.  No cervical lymphadenopathy. No JVD.  Respiratory:  Breath sounds normal.  No distress. No wheezes, rales or rhonchi.   Cardiovascular:  Heart sometimes tachycardic rate and regular rhythm.  No murmurs/gallops/rubs.  Pulses +2 bilaterally  Abdomen:  Soft. Diffusely tender.  Normal bowel sounds. No tympanitic. No distension. No bruits. No guarding. No rebound.   Back:  No CVA tenderness bilaterally. No spinal Tenderness.  Extremities:  Full range of motion.  No edema.  No cyanosis.  No deformity.  Skin:  Warm.  Dry.  No pallor.  No rashes.  No lesions.  No bruises  Neurological:  Alert. Oriented.  No focal motor deficits.   Cranial Nerves II-XII Grossly intact.  Psychiatric:  Normal affect.  No anxiety.  No depression.  No agitation.       Diagnostic Results     The results of the diagnostic studies have been reviewed by myself:    Radiologic Studies  No results found.         Consultation     CONSULT  10/22/2016 9:22 PM : Placed call to [facility].  Will come see patient.           Procedures     None          Rosalyn Gess. Grace Isaac, M.D.      ATTESTATIONS      The documentation  recorded by my scribe, Kandy Garrison, accurately reflects the services I personally performed and the decisions made by me.  Rosalyn Gess Grace Isaac, M.D.    Note: This chart was generated by the Epic EMR system/speech recognition and may contain inherent errors or omissions not intended by the user. Grammatical errors, random word insertions, deletions, pronoun errors and incomplete sentences are occasional consequences of this technology due to software limitations. Not all errors are caught or corrected. If there are questions or concerns about the content of this note or information contained within the body of this dictation they should be addressed directly with the author for clarification        Tori Milks, MD  10/22/16 2306

## 2016-10-22 NOTE — ED Notes (Signed)
Bed: RAU-G  Expected date:   Expected time:   Means of arrival:   Comments:  To 13

## 2016-10-23 ENCOUNTER — Emergency Department: Payer: BC Managed Care – PPO

## 2016-10-23 DIAGNOSIS — R109 Unspecified abdominal pain: Secondary | ICD-10-CM | POA: Diagnosis present

## 2016-10-23 LAB — ECG 12-LEAD
P Wave Axis: 42 deg
P-R Interval: 116 ms
Patient Age: 30 years
Q-T Interval(Corrected): 397 ms
Q-T Interval: 376 ms
QRS Axis: 58 deg
QRS Duration: 87 ms
T Axis: 42 years
Ventricular Rate: 67 //min

## 2016-10-23 MED ORDER — HYDROMORPHONE HCL 0.5 MG/0.5 ML IJ SOLN
0.5000 mg | Freq: Once | INTRAMUSCULAR | Status: AC
Start: 2016-10-23 — End: 2016-10-23
  Administered 2016-10-23: 0.5 mg via INTRAVENOUS

## 2016-10-23 MED ORDER — SODIUM CHLORIDE 0.9 % IV SOLN
INTRAVENOUS | Status: DC
Start: 2016-10-23 — End: 2016-10-24

## 2016-10-23 MED ORDER — HYDROMORPHONE HCL 0.5 MG/0.5 ML IJ SOLN
0.4000 mg | INTRAMUSCULAR | Status: DC | PRN
Start: 2016-10-23 — End: 2016-10-24
  Administered 2016-10-23 – 2016-10-24 (×8): 0.4 mg via INTRAVENOUS
  Filled 2016-10-23 (×8): qty 0.5

## 2016-10-23 MED ORDER — CEFTRIAXONE SODIUM-DEXTROSE 1-3.74 GM-% IV SOLR
1.0000 g | INTRAVENOUS | Status: DC
Start: 2016-10-23 — End: 2016-10-24
  Administered 2016-10-23 – 2016-10-24 (×2): 1 g via INTRAVENOUS
  Filled 2016-10-23: qty 50

## 2016-10-23 MED ORDER — ACETAMINOPHEN 650 MG RE SUPP
650.0000 mg | RECTAL | Status: DC | PRN
Start: 2016-10-23 — End: 2016-10-24

## 2016-10-23 MED ORDER — HYDROCODONE-ACETAMINOPHEN 5-325 MG PO TABS
1.0000 | ORAL_TABLET | Freq: Four times a day (QID) | ORAL | Status: DC | PRN
Start: 2016-10-23 — End: 2016-10-24
  Administered 2016-10-23 – 2016-10-24 (×4): 2 via ORAL
  Filled 2016-10-23 (×4): qty 2

## 2016-10-23 MED ORDER — CEFTRIAXONE SODIUM-DEXTROSE 1-3.74 GM-% IV SOLR
INTRAVENOUS | Status: AC
Start: 2016-10-23 — End: ?
  Filled 2016-10-23: qty 50

## 2016-10-23 MED ORDER — ONDANSETRON HCL 4 MG/2ML IJ SOLN
4.0000 mg | Freq: Three times a day (TID) | INTRAMUSCULAR | Status: DC
Start: 2016-10-23 — End: 2016-10-24
  Administered 2016-10-23 – 2016-10-24 (×3): 4 mg via INTRAVENOUS
  Filled 2016-10-23 (×7): qty 2

## 2016-10-23 MED ORDER — ACETAMINOPHEN 160 MG/5ML PO SOLN
650.0000 mg | ORAL | Status: DC | PRN
Start: 2016-10-23 — End: 2016-10-24

## 2016-10-23 MED ORDER — VH BIO-K PLUS PROBIOTIC 50 BIL CFU CAPSULE
50.0000 | DELAYED_RELEASE_CAPSULE | Freq: Every day | ORAL | Status: DC
Start: 2016-10-23 — End: 2016-10-24
  Administered 2016-10-23 – 2016-10-24 (×2): 50 via ORAL
  Filled 2016-10-23 (×3): qty 1

## 2016-10-23 MED ORDER — ENOXAPARIN SODIUM 40 MG/0.4ML SC SOLN
40.0000 mg | SUBCUTANEOUS | Status: DC
Start: 2016-10-23 — End: 2016-10-24
  Administered 2016-10-23 – 2016-10-24 (×2): 40 mg via SUBCUTANEOUS
  Filled 2016-10-23 (×4): qty 0.4

## 2016-10-23 MED ORDER — ACETAMINOPHEN 325 MG PO TABS
650.0000 mg | ORAL_TABLET | ORAL | Status: DC | PRN
Start: 2016-10-23 — End: 2016-10-24

## 2016-10-23 MED ORDER — SODIUM CHLORIDE 0.9 % IJ SOLN
0.4000 mg | INTRAMUSCULAR | Status: DC | PRN
Start: 2016-10-23 — End: 2016-10-24

## 2016-10-23 MED ORDER — ALPRAZOLAM 0.5 MG PO TABS
0.5000 mg | ORAL_TABLET | ORAL | Status: DC | PRN
Start: 2016-10-23 — End: 2016-10-24

## 2016-10-23 MED ORDER — SODIUM CHLORIDE 0.9 % IJ SOLN
20.0000 mg | Freq: Two times a day (BID) | INTRAVENOUS | Status: DC
Start: 2016-10-23 — End: 2016-10-24
  Administered 2016-10-23 – 2016-10-24 (×3): 20 mg via INTRAVENOUS
  Filled 2016-10-23 (×7): qty 2

## 2016-10-23 MED ORDER — IOHEXOL 350 MG/ML IV SOLN
100.0000 mL | Freq: Once | INTRAVENOUS | Status: AC | PRN
Start: 2016-10-23 — End: 2016-10-23
  Administered 2016-10-23: 100 mL via INTRAVENOUS

## 2016-10-23 MED ORDER — LEVOTHYROXINE SODIUM 88 MCG PO TABS
88.0000 ug | ORAL_TABLET | Freq: Every day | ORAL | Status: DC
Start: 2016-10-23 — End: 2016-10-24
  Administered 2016-10-23 – 2016-10-24 (×2): 88 ug via ORAL
  Filled 2016-10-23 (×3): qty 1

## 2016-10-23 MED ORDER — HYDROMORPHONE HCL 0.5 MG/0.5 ML IJ SOLN
INTRAMUSCULAR | Status: AC
Start: 2016-10-23 — End: ?
  Filled 2016-10-23: qty 0.5

## 2016-10-23 NOTE — Plan of Care (Signed)
Problem: Altered GI Function  Goal: Fluid and electrolyte balance are achieved/maintained  Outcome: Progressing   10/23/16 0034   Goal/Interventions addressed this shift   Fluid and electrolyte balance are achieved/maintained Monitor intake and output every shift;Monitor/assess lab values and report abnormal values;Provide adequate hydration;Monitor daily weight;Assess and reassess fluid and electrolyte status     Goal: Elimination patterns are normal or improving   10/23/16 0034   Goal/Interventions addressed this shift   Elimination patterns are normal or improving Report abnormal assessment to physician;Anticipate/assist with toileting needs;Assess for normal bowel sounds;Monitor for abdominal distension;Monitor for abdominal discomfort     Goal: Nutritional intake is adequate   10/23/16 0034   Goal/Interventions addressed this shift   Nutritional intake is adequate Monitor daily weights;Assist patient with meals/food selection;Allow adequate time for meals;Include patient/patient care companion in decisions related to nutrition;Assess anorexia, appetite, and amount of meal/food tolerated     Goal: Mobility/Activity is maintained at optimal level for patient   10/23/16 0034   Goal/Interventions addressed this shift   Mobility/activity is maintained at optimal level for patient Increase mobility as tolerated/progressive mobility;Reposition patient every 2 hours and as needed unless able to reposition self;Assess for changes in respiratory status, level of consciousness and/or development of fatigue       Comments: Pt cont with abdominal pain 8/10 and nausea

## 2016-10-23 NOTE — H&P (Signed)
History of Present Illness  10/23/2016   Chief complaint : Abdominal pain with nausea and vomiting    HPI:    [name] is a 30 y.o.  female past medical history of Crohn's disease, SVT never required cardioversion or medication, Hashimoto's thyroiditis with hypothyroidism presented to ED with the complaints of abdominal pain with nausea and vomiting. She was admitted at [facility] last month for pneumonia and pyelonephritis and Crohn's exacerbation as per patient. The patient presented for one day history of diffuse crampy lower abdominal pain associated with nausea and vomiting unable to tolerate oral diet. In ED MAXIMUM TEMPERATURE was 99.7. Patient had initially heart rate was 170s however spontaneously resolved now normal sinus rhythm. UA was positive for urinary tract infection lipase is normal CT abdomen was unremarkable.        Past Medical History   Past Medical History:   Diagnosis Date   . Abortion    . Anxiety    . Crohn's disease    . H/O Hashimoto thyroiditis     hypothyroidism   . PCOS (polycystic ovarian syndrome)    . Tachycardia            Past Surgical History  Past Surgical History:   Procedure Laterality Date   . BREAST IMPLANT     . COLONOSCOPY     . D & C, SUCTION N/A 08/24/2014    Procedure: D & C, SUCTION;  Surgeon: Maryruth Hancock, MD;  Location: [facility];  Service: Obstetrics;  Laterality: N/A;   . INDUCED ABORTION         Allergies  Allergies   Allergen Reactions   . Morphine    . Bactrim [Sulfamethoxazole W/Trimethoprim (Co-Trimoxazole)] Rash   . Sulfa Antibiotics Rash       Prior to Admission Medications  Current Facility-Administered Medications   Medication Dose Route Frequency Provider Last Rate Last Dose   . cefTRIAXone (ROCEPHIN) 1 g IVPB 50 mL (duplex) 1 g  1 g Intravenous Q24H Cherlynn Perches A, MD       . famotidine (PEPCID) injection 20 mg  20 mg Intravenous Q12H [facility] Cherlynn Perches A, MD       . HYDROmorphone (DILAUDID) injection 0.4 mg  0.4 mg  Intravenous Q4H PRN Cherlynn Perches A, MD       . HYDROmorphone (DILAUDID) injection 0.5 mg  0.5 mg Intravenous Once in ED Cherlynn Perches A, MD       . lactobacillus species (BIO-K PLUS) capsule 50 Billion CFU  50 Billion CFU Oral Daily Ilda Mori, MD         Current Outpatient Prescriptions   Medication Sig Dispense Refill   . adalimumab (HUMIRA PEN) 40 MG/0.8ML Pen-injector Kit Inject 40 mg into the skin every 14 (fourteen) days.     . ondansetron (ZOFRAN-ODT) 4 MG disintegrating tablet Take 4 mg by mouth every 8 (eight) hours as needed for Nausea.     . ALPRAZolam (XANAX PO) Take 0.5 mg by mouth as needed.        Marland Kitchen azithromycin (ZITHROMAX) 250 MG tablet Take 2 pills on the first day and then one each day 6 tablet 0   . budesonide (ENTOCORT EC) 3 MG 24 hr capsule Take 2 capsules (6 mg total) by mouth every morning. 30 capsule 0   . cefdinir (OMNICEF) 300 MG capsule Take 1 capsule (300 mg total) by mouth 2 (two) times daily. 14 capsule 0   . Certolizumab  Pegol (CIMZIA SC) Inject into the skin.     . clonazePAM (KLONOPIN) 1 MG tablet Take 1 mg by mouth daily.     . Dicyclomine HCl (BENTYL PO) Take by mouth as needed.     . fish oil-omega-3 fatty acids 1000 MG capsule Take 2 g by mouth daily.     Marland Kitchen HYDROcodone-acetaminophen (NORCO) 5-325 MG per tablet Take 1-2 tablets by mouth every 6 (six) hours as needed for Pain. 20 tablet 0   . hydrocodone-chlorpheniramine (TUSSIONEX PENNKINETIC ER) 10-8 MG/5ML suspension Take 5 mLs by mouth 2 (two) times daily. 50 mL 0   . Ibuprofen (MOTRIN PO) Take by mouth as needed.        Marland Kitchen levothyroxine (SYNTHROID, LEVOTHROID) 88 MCG tablet Take 88 mcg by mouth daily.     . Multiple Vitamins-Minerals (HAIR/SKIN/NAILS) Tab Take 1 Tab by Mouth Once a Day. Indications: Vitamin Deficiency     . norelgestromin-ethinyl estradiol (ORTHO EVRA) 150-35 MCG/24HR Place 1 patch onto the skin once a week.     . ondansetron (ZOFRAN ODT) 4 MG disintegrating tablet Take 1 tablet (4 mg total) by  mouth every 8 (eight) hours as needed for Nausea. 20 tablet 0   . ondansetron (ZOFRAN ODT) 4 MG disintegrating tablet Take 1 tablet (4 mg total) by mouth every 8 (eight) hours as needed for Nausea. 20 tablet 0   . oxyCODONE-acetaminophen (PERCOCET) 5-325 MG per tablet 1-2 tablets by mouth every 4-6 hours as needed for pain;  Do not drive or operate machinery while taking this medicine 15 tablet 0   . oxyCODONE-acetaminophen (PERCOCET) 5-325 MG per tablet 1-2 tablets by mouth every 4-6 hours as needed for pain;  Do not drive or operate machinery while taking this medicine 10 tablet 0   . Zolpidem Tartrate (AMBIEN PO) Take 10 mg by mouth nightly as needed.              Social History  Social History   Substance Use Topics   . Smoking status: Never Smoker   . Smokeless tobacco: Never Used   . Alcohol use Yes      Comment: rare       Family History  Family history was obtained and was non contributory for cardiac history, cancer history.     Review of Systems  Ten completed review of systems including eyes, ENT, cardiovascular, respiratory, gastrointestinal, genitourinary, psychiatric, neurologic, integumentary, allergic/hematology,  are reviewed and found unremarkable except pertinent positives mentioned in the history of present illness and past medical history.       Physical Exam  BP 117/83   Pulse 85   Temp 98.8 F (37.1 C) (Oral)   Resp 18   Ht 1.549 m (5\' 1" )   Wt (!) 44.3 kg (97 lb 10.6 oz)   LMP 09/28/2016 (Exact Date)   SpO2 96%   BMI 18.45 kg/m     Intake/Output Summary (Last 24 hours) at 10/23/16 0059  Last data filed at 10/22/16 2133   Gross per 24 hour   Intake             1000 ml   Output                0 ml   Net             1000 ml     Physical Exam    1) General appearance:  well developed,well nourished, in no apparent acute cardiorespiratory distress.     2)  HEENT: Head is atraumatic and normocephalic. Pupils are equally reactive to light and accommodation. Extraocular muscles are intact.  Patient has intact external auditory canal. No abnormal lesions or bleeding from nose. Oral mucosa moist with no pharyngeal congestion.     3) Neck: Supple. Trachea is central, no JVD or carotid bruit.     4) Chest: Clear to auscultation bilaterally, no wheezing     6) CVS: The S1, S2 normal. Regular rate and rhythm.No murmur    7) Abdomen:  soft, Diffuse mild abdominal tenderness non distended, no palpable mass. Bowel sounds audible bowel.     8) Musculoskeletal: Patient has 5/5 motor strength in bilateral upper extremities as well as bilateral LE. No pitting edema in bilateral lower extremities.    9) Neurological: Cranial nerves II-XII intact. Grossly non focal    10) Psychiatric: Alert and oriented x 3. Mood is appropriate.     11) Integumentary: warm with normal skin turgor, no rash    12) Lymphatics: No lymphadenopathy in axillary, cervial and inguinal area.     EKG reviewed by me none  Labs    Labs Reviewed   CBC AND DIFFERENTIAL - Abnormal; Notable for the following:        Result Value    Hemoglobin 11.0 (*)     Hematocrit 33.9 (*)     PLT CT 442 (*)     All other components within normal limits   COMPREHENSIVE METABOLIC PANEL - Abnormal; Notable for the following:     Protein, Total 8.7 (*)     Globulin 4.4 (*)     All other components within normal limits   LIPASE - Abnormal; Notable for the following:     Lipase <4 (*)     All other components within normal limits   VH URINALYSIS WITH MICROSCOPIC AND CULTURE IF INDICATED       - Abnormal; Notable for the following:     Clarity, UA Slightly Cloudy (*)     Protein, UR 100 (*)     Ketones UA 20 (*)     Leukocyte Esterase, UA 25 (*)     WBC, UA 12 (*)     Bacteria, UA Few (*)     Squam Epithel, UA 5 (*)     All other components within normal limits    Narrative:     A Urine Culture has been ordered based upon the Positive UA results.   VH CULTURE, URINE   HCG, SERUM, QUALITATIVE       Assessment and Plan    1.Abdominal pain with nausea and vomiting unable to  tolerate oral diet so far etiology unclear  Negative pregnancy test  Lipase normal CT abdomen ruled out Crohn's exacerbation/intestinal obstruction normal WBC count  We'll continue IV fluid pain medication will try to limit IV pain medication  IV Pepcid  Serial abdominal examination  2. Urinary tract infection will continue IV Rocephin  3. Paroxysmal SVT spelled spontaneously resolved  4. Hypothyroidism continue levothyroxine  5. Anxiety continue Xanax        CODE Status : Full code    GI prophylaxis Pepcid  DVT prophylaxis Lovenox      Ilda Mori  10/23/2016    12:59 AM

## 2016-10-23 NOTE — Progress Notes (Signed)
10/23/16 1509   Case Management Quick Doc   Case Management Assessment Status Assessment Complete   CM Comments 10/26, RNCM,   H/O Chron's , acute abdominal pain, CT scan showed no evidence of Chron's flare. Receiving  IVF, pain management, IV abx., clears, advancing diet as tolerated. Not anticipating need for RNCM intervention, has PCP and insurance    Expected Discharge Date 10/24/16   Physical Discharge Disposition Home, No Needs   Meyer Russel, RN, BSN  Case Manager  [phone]

## 2016-10-23 NOTE — Plan of Care (Signed)
Problem: Altered GI Function  Goal: Fluid and electrolyte balance are achieved/maintained  Outcome: Progressing   10/23/16 1222   Goal/Interventions addressed this shift   Fluid and electrolyte balance are achieved/maintained Monitor intake and output every shift;Monitor/assess lab values and report abnormal values;Provide adequate hydration;Assess for confusion/personality changes;Assess and reassess fluid and electrolyte status     Goal: Elimination patterns are normal or improving  Outcome: Progressing   10/23/16 1222   Goal/Interventions addressed this shift   Elimination patterns are normal or improving Report abnormal assessment to physician;Anticipate/assist with toileting needs;Assess for normal bowel sounds;Monitor for abdominal distension;Monitor for abdominal discomfort;Assess for signs and symptoms of bleeding. Report signs of bleeding to physician;Assess for flatus;Reinforce education on foods that improve and complicate bowel movements and how activity and medications can affect bowel movements     Goal: Nutritional intake is adequate  Outcome: Progressing   10/23/16 1222   Goal/Interventions addressed this shift   Nutritional intake is adequate Monitor daily weights;Assist patient with meals/food selection;Allow adequate time for meals;Encourage/perform oral hygiene as appropriate;Encourage/administer dietary supplements as ordered (i.e. tube feed, TPN, oral, OGT/NGT, supplements);Consult/collaborate with Clinical Nutritionist;Include patient/patient care companion in decisions related to nutrition;Assess anorexia, appetite, and amount of meal/food tolerated

## 2016-10-23 NOTE — Plan of Care (Signed)
Problem: Altered GI Function  Goal: Fluid and electrolyte balance are achieved/maintained   10/23/16 1222   Goal/Interventions addressed this shift   Fluid and electrolyte balance are achieved/maintained Monitor intake and output every shift;Monitor/assess lab values and report abnormal values;Provide adequate hydration;Assess for confusion/personality changes;Assess and reassess fluid and electrolyte status

## 2016-10-23 NOTE — Progress Notes (Signed)
Patient seen and examined today. Admitted with abdominal pain, nausea and vomiting. As stated by the patient ,she  was diagnosed with Crohn's disease when she was 30 years of age. This is her second exacerbation since 2017. She was initially on Remicade   Which has been stopped for the last 3 years. As per  patient she is currently on Humira and follows  GI specialist in [place]. Her nausea, vomiting has improved this morning and abdominal pain improved. So I'll start patient on clear liquid diet and increase to regular as tolerated. All labs result and radiology reviewed

## 2016-10-23 NOTE — Progress Notes (Signed)
10/23/16 1505   Patient Type   Within 30 Days of Previous Admission? No   Healthcare Decisions   Orientation/Decision Making Abilities of Patient Alert and Oriented x3, able to make decisions   Prior to admission   Prior level of function Independent with ADLs   Type of Residence Private residence   Discharge Planning   Support Systems Family members   Patient expects to be discharged to: Home   Mode of transportation: Private car (family member)   Consults/Providers   PT Evaluation Needed 2   OT Evalulation Needed 2   SLP Evaluation Needed 2   Correct PCP listed in Epic? Heath Lark, NP)   Meyer Russel, RN, BSN  Case Manager  [phone]

## 2016-10-23 NOTE — ED Notes (Signed)
Per VBO by Dr. Minus Liberty pt to receive 0.5 mg IV dilaudid for increased abdominal pain. MD made aware that CT was completed and was back in room.

## 2016-10-24 LAB — BASIC METABOLIC PANEL
Anion Gap: 7.2 mMol/L (ref 7.0–18.0)
BUN / Creatinine Ratio: 12.5 Ratio (ref 10.0–30.0)
BUN: 8 mg/dL (ref 7–22)
CO2: 26.3 mMol/L (ref 20.0–30.0)
Calcium: 7.8 mg/dL — ABNORMAL LOW (ref 8.5–10.5)
Chloride: 110 mMol/L (ref 98–110)
Creatinine: 0.64 mg/dL (ref 0.60–1.20)
EGFR: 121 mL/min/{1.73_m2} (ref 60–150)
Glucose: 81 mg/dL (ref 70–99)
Osmolality Calc: 277 mOsm/kg (ref 275–300)
Potassium: 3.5 mMol/L (ref 3.5–5.3)
Sodium: 140 mMol/L (ref 136–147)

## 2016-10-24 LAB — CBC AND DIFFERENTIAL
Basophils %: 1.4 % (ref 0.0–3.0)
Basophils Absolute: 0.1 10*3/uL (ref 0.0–0.3)
Eosinophils %: 1.7 % (ref 0.0–7.0)
Eosinophils Absolute: 0.1 10*3/uL (ref 0.0–0.8)
Hematocrit: 30.4 % — ABNORMAL LOW (ref 36.0–48.0)
Hemoglobin: 9.3 gm/dL — ABNORMAL LOW (ref 12.0–16.0)
Lymphocytes Absolute: 3.7 10*3/uL (ref 0.6–5.1)
Lymphocytes: 57 % — ABNORMAL HIGH (ref 15.0–46.0)
MCH: 27 pg — ABNORMAL LOW (ref 28–35)
MCHC: 31 gm/dL — ABNORMAL LOW (ref 32–36)
MCV: 89 fL (ref 80–100)
MPV: 6.9 fL (ref 6.0–10.0)
Monocytes Absolute: 0.7 10*3/uL (ref 0.1–1.7)
Monocytes: 11 % (ref 3.0–15.0)
Neutrophils %: 28.9 % — ABNORMAL LOW (ref 42.0–78.0)
Neutrophils Absolute: 1.9 10*3/uL (ref 1.7–8.6)
PLT CT: 390 10*3/uL (ref 130–440)
RBC: 3.41 10*6/uL — ABNORMAL LOW (ref 3.80–5.00)
RDW: 13.3 % (ref 11.0–14.0)
WBC: 6.5 10*3/uL (ref 4.0–11.0)

## 2016-10-24 LAB — PHOSPHORUS: Phosphorus: 2.9 mg/dL (ref 2.3–4.7)

## 2016-10-24 LAB — MAGNESIUM: Magnesium: 1.7 mg/dL (ref 1.6–2.6)

## 2016-10-24 MED ORDER — CEPHALEXIN 500 MG PO CAPS
500.0000 mg | ORAL_CAPSULE | Freq: Two times a day (BID) | ORAL | 0 refills | Status: AC
Start: 2016-10-24 — End: 2016-10-31

## 2016-10-24 MED ORDER — HYDROCODONE-ACETAMINOPHEN 5-325 MG PO TABS
1.0000 | ORAL_TABLET | Freq: Four times a day (QID) | ORAL | 0 refills | Status: DC | PRN
Start: 2016-10-24 — End: 2016-11-03

## 2016-10-24 MED ORDER — FAMOTIDINE 20 MG PO TABS
20.0000 mg | ORAL_TABLET | Freq: Two times a day (BID) | ORAL | Status: DC
Start: 2016-10-24 — End: 2016-10-24
  Filled 2016-10-24 (×2): qty 1

## 2016-10-24 MED ORDER — ONDANSETRON 4 MG PO TBDP
4.0000 mg | ORAL_TABLET | Freq: Three times a day (TID) | ORAL | 0 refills | Status: AC | PRN
Start: 2016-10-24 — End: 2016-10-29

## 2016-10-24 NOTE — Progress Notes (Signed)
PT with decreasing nausea and diarrhea. States she feels her pain is controllable with po meds at home. Reviewed discharge instructions with pt, verbalized understanding, has no questions. IV discontinued and PIV removed without incident.. Pt ambulated independently to vehicle.

## 2016-10-24 NOTE — Progress Notes (Signed)
Pharmacy IV to PO Conversion Note    Previous IV dose:  Famotidine 20 mg iv q12h  New oral dose:  Famotidine 20 mg po q12h    1.  The patient met the following conversion criteria and can receive oral therapy.    Able to eat or tolerate enteral feeding OR   Receiving enteral nutrition by the oral, gastric, or nasogastric tube OR   Receiving other scheduled medications by the oral route    2.  Patient does not have any of the following:    Active GI bleed    Unable to swallow, refuses oral medication, or is strict NPO    Presence of nausea, vomiting, or diarrhea in the past 24 hours   History of gastrointestinal obstruction, malabsorption syndrome, or ileus   Physician indicated that the patient should not be switched to oral therapy       If you have any questions, please contact the pharmacist at [phone].    [facility]

## 2016-10-24 NOTE — Discharge Summary (Signed)
DISCHARGE SUMMARY - [facility]    Patient Name: [name]  Attending Physician: Hyacinth Meeker, MD  Primary Care Physician: Vivia Ewing, NP    Date of Admission: 10/22/2016  Date of Discharge: 10/24/2016  Length of Stay in the Hospital: 0    Discharge Diagnoses:                                                                           1.Abdominal pain resolved with H/O crohn's disease  2.nausea and vomiting improved   3. Urinary tract infection   4. Paroxysmal SVT resolved  5. Hypothyroidism   6. Anxiety   Discharge Medications:                                                                            Medication List      CHANGE how you take these medications    HYDROcodone-acetaminophen 5-325 MG per tablet  Commonly known as:  NORCO  Take 1 tablet by mouth every 6 (six) hours as needed for Pain.for up to 15 doses  What changed:  how much to take     ondansetron 4 MG disintegrating tablet  Commonly known as:  ZOFRAN ODT  Take 1 tablet (4 mg total) by mouth every 8 (eight) hours as needed for Nausea.for up to 5 days  What changed:  Another medication with the same name was removed. Continue taking this medication, and follow the directions you see here.  KEFLEX 500mg  orally twice daily for 7 days      CONTINUE taking these medications    AMBIEN PO     BENTYL PO     budesonide 3 MG 24 hr capsule  Commonly known as:  ENTOCORT EC  Take 2 capsules (6 mg total) by mouth every morning.     clonazePAM 1 MG tablet  Commonly known as:  KlonoPIN     fish oil-omega-3 fatty acids 1000 MG capsule     HAIR/SKIN/NAILS Tabs     HUMIRA PEN 40 MG/0.8ML Pnkt  Generic drug:  adalimumab     levothyroxine 88 MCG tablet  Commonly known as:  SYNTHROID, LEVOTHROID     MOTRIN PO     norelgestromin-ethinyl estradiol 150-35 MCG/24HR  Commonly known as:  ORTHO EVRA     XANAX PO        STOP taking these medications    azithromycin 250 MG tablet  Commonly known as:  ZITHROMAX     cefdinir 300 MG  capsule  Commonly known as:  OMNICEF     CIMZIA SC     hydrocodone-chlorpheniramine 10-8 MG/5ML suspension  Commonly known as:  TUSSIONEX PENNKINETIC ER     oxyCODONE-acetaminophen 5-325 MG per tablet  Commonly known as:  PERCOCET           Where to Get Your Medications      These medications were sent to [facility] [address],  [place] - [address]  [address] [place], [place] [address]    Phone:  [phone]    ondansetron 4 MG disintegrating tablet     You can get these medications from any pharmacy    Bring a paper prescription for each of these medications   HYDROcodone-acetaminophen 5-325 MG per tablet         Consultations:                                                                                       none    Labs:                                                                                         Recent Labs  Lab 10/24/16  0642 10/22/16  1901   WBC 6.5 6.8   RBC 3.41* 3.89   Hemoglobin 9.3* 11.0*   Hematocrit 30.4* 33.9*   MCV 89 87   PLT CT 390 442*         Recent Labs  Lab 10/24/16  0642 10/22/16  1901   Sodium 140 141   Potassium 3.5 3.6   Chloride 110 105   CO2 26.3 24.3   BUN 8 10   Creatinine 0.64 0.82   Glucose 81 92   Calcium 7.8* 10.1   Magnesium 1.7  --          Recent Labs  Lab 10/22/16  1901   ALT 19   AST (SGOT) 23   Bilirubin, Total 0.6   Albumin 4.3   Alkaline Phosphatase 64             Imaging studies:                                                                                       Ct Abdomen Pelvis W Iv/ Wo Po Cont    Result Date: 10/23/2016  Normal CT abdomen and pelvis. No evidence of Crohn's flare. ReadingStation:[id number]      Culture results:  Microbiology Results     Procedure Component Value Units Date/Time    Urine Culture [[id number]] Collected:  10/22/16 2253    Specimen:  Urine, Random Updated:  10/23/16 0529    Narrative:        Specimen: Urine, Random  Collected: 10/22/2016 22:53     Status: Valued      Last Updated: 10/23/2016 05:29                Culture Result (Prelim)      Culture In Progress                Hospital Course:                                                                                    For full details of the patient's admission please consult the whole medical record including daily progress notes, history and physical, consult notes, lab reports as well as imaging studies.  Briefly [name] is a 30 y.o.  female past medical history of Crohn's disease, SVT never required cardioversion or medication, Hashimoto's thyroiditis with hypothyroidism presented to ED with the complaints of abdominal pain with nausea and vomiting. She was admitted at [facility] last month for pneumonia and pyelonephritis and Crohn's exacerbation as per patient. The patient presented for one day history of diffuse crampy lower abdominal pain associated with nausea and vomiting unable to tolerate oral diet.In ER  UA was positive for urinary tract infection, lipase was normal  and CT abdomen was unremarkable. She does give  history of Crohn's disease diagnosed when she was 30 years of age. She sees a GI specialist in [place] and currently on Humira. Prior to that she was on Remicade for several years. Since admission her nausea ,vomiting and abdominal pain has improved. Diarrhea has improved. Patient is currently tolerating regular diet. Her CBC count is within normal limit. So patient was discharged home on stable condition on 10/24/16 with follow-up with her GI specialist.  Patient was discharged on Keflex 500 mg orally  twice daily for 7 days    Discharge Day Exam:  Temp:  [97.6 F (36.4 C)-98.2 F (36.8 C)] 97.6 F (36.4 C)  Heart Rate:  [68-87] 82  Resp Rate:  [18] 18  BP: (90-101)/(52-72) 101/59    Weight Monitoring 09/17/2014 10/22/2014 10/28/2014 11/10/2014 02/25/2015 11/01/2015 10/22/2016   Height 154.9  cm 154.9 cm 154.9 cm 154.9 cm 154.9 cm 154.9 cm 154.9 cm   Height Method Stated Stated Stated Stated Stated Stated Stated   Weight 40.37 kg 41.731 kg 41.4 kg 40.37 kg 44.1 kg 45.3 kg 44.3 kg   Weight Method Stated Stated Actual Stated Actual Actual Actual   BMI (calculated) 16.9 kg/m2 17.4 kg/m2 17.3 kg/m2 16.9 kg/m2 18.4 kg/m2 18.9 kg/m2 18.5 kg/m2         General:   no acute distress  Psychiatry :awake, alert, oriented x 3; mood is appropriate.  Cardiovascular: regular rate and rhythm,S1 and S2 normal.  Respiratory: clear to auscultation bilaterally, no wheezing, non-labored breathing.  Abdomen: soft, Non-tender, non-distended, audible bowel sounds. No palpable mass.  Extremities: no clubbing, cyanosis, or edema. Expected degrees  of motion in all 4 extremities  Neuro: CN II-XII intact, no gross focal deficit    Discharge condition: stable    Discharge instructions:                                                                             Nutrition: Regular diet    Activity: As tolerated     Follow up:  Follow-up Information     Schutter, Elenore Paddy, NP Follow up in 1 week(s).    Specialty:  Nurse Practitioner  Contact information:  [address]  [place] [address]  [phone]                   Time spent coordinating discharge and reviewing discharge plan: 35 minutes    Signed by:   Janalyn Harder Avaree Gilberti  10/24/2016  10:01 AM    [facility]  Office number [phone]    CC: Schutter, Elenore Paddy, NP

## 2016-10-24 NOTE — Plan of Care (Signed)
Problem: Altered GI Function  Goal: Fluid and electrolyte balance are achieved/maintained  Outcome: Progressing    Goal: Nutritional intake is adequate  Outcome: Progressing      Problem: Moderate/High Fall Risk Score >5  Goal: Patient will remain free of falls  Outcome: Adequate for Discharge

## 2016-10-24 NOTE — UM Notes (Signed)
DOS 10/23/16    Observation     Presents to ED with c/o abd and vomiting.      99.7, 170, 18, 124/72.  SpO2 100%. Pain 8/10    Tx in ED:    IV NS bolus 1000 ml x 2  IV Dilaudid x 3  IV Zofran   IV Phenergan x 1  IV Rocephin    Assessment and plan per Dr. Minus Liberty:   "1.Abdominal pain with nausea and vomiting unable to tolerate oral diet so far etiology unclear  Negative pregnancy test  Lipase normal CT abdomen ruled out Crohn's exacerbation/intestinal obstruction normal WBC count  We'll continue IV fluid pain medication will try to limit IV pain medication  IV Pepcid  Serial abdominal examination  2. Urinary tract infection will continue IV Rocephin  3. Paroxysmal SVT spelled spontaneously resolved  4. Hypothyroidism continue levothyroxine  5. Anxiety continue Xanax"    DOS 10/24/16    DCed home    Andreas Blower, RN  Utilization Management  Phone:  [phone]  Fax:  [phone]

## 2016-10-24 NOTE — Progress Notes (Signed)
10/24/16 1348   Case Management Quick Doc   CM Comments 10/27, H/o Chron's, abd pain. Tolerating orals without emesis, ambulating.  has been d/ced to home, no new needs. Independent, a nurse, insured w/ PCP/     Physical Discharge Disposition Home, No Needs   Meyer Russel, RN, BSN  Case Manager  [phone]

## 2016-10-27 DIAGNOSIS — K529 Noninfective gastroenteritis and colitis, unspecified: Secondary | ICD-10-CM | POA: Insufficient documentation

## 2016-11-03 ENCOUNTER — Emergency Department: Payer: BC Managed Care – PPO

## 2016-11-03 ENCOUNTER — Emergency Department
Admission: EM | Admit: 2016-11-03 | Discharge: 2016-11-03 | Disposition: A | Discharge status: Home / Self Care | Payer: Self-pay | Attending: Emergency Medicine | Admitting: Emergency Medicine

## 2016-11-03 DIAGNOSIS — K50919 Crohn's disease, unspecified, with unspecified complications: Secondary | ICD-10-CM | POA: Insufficient documentation

## 2016-11-03 DIAGNOSIS — R110 Nausea: Secondary | ICD-10-CM

## 2016-11-03 DIAGNOSIS — R109 Unspecified abdominal pain: Secondary | ICD-10-CM | POA: Insufficient documentation

## 2016-11-03 DIAGNOSIS — R112 Nausea with vomiting, unspecified: Secondary | ICD-10-CM | POA: Insufficient documentation

## 2016-11-03 LAB — CBC AND DIFFERENTIAL
Basophils %: 0.9 % (ref 0.0–3.0)
Basophils Absolute: 0.1 10*3/uL (ref 0.0–0.3)
Eosinophils %: 0.5 % (ref 0.0–7.0)
Eosinophils Absolute: 0 10*3/uL (ref 0.0–0.8)
Hematocrit: 33 % — ABNORMAL LOW (ref 36.0–48.0)
Hemoglobin: 10.5 gm/dL — ABNORMAL LOW (ref 12.0–16.0)
Lymphocytes Absolute: 4.1 10*3/uL (ref 0.6–5.1)
Lymphocytes: 47.6 % — ABNORMAL HIGH (ref 15.0–46.0)
MCH: 27 pg — ABNORMAL LOW (ref 28–35)
MCHC: 32 gm/dL (ref 32–36)
MCV: 85 fL (ref 80–100)
MPV: 6.2 fL (ref 6.0–10.0)
Monocytes Absolute: 1.1 10*3/uL (ref 0.1–1.7)
Monocytes: 13 % (ref 3.0–15.0)
Neutrophils %: 38 % — ABNORMAL LOW (ref 42.0–78.0)
Neutrophils Absolute: 3.2 10*3/uL (ref 1.7–8.6)
PLT CT: 675 10*3/uL — ABNORMAL HIGH (ref 130–440)
RBC: 3.9 10*6/uL (ref 3.80–5.00)
RDW: 13.1 % (ref 11.0–14.0)
WBC: 8.5 10*3/uL (ref 4.0–11.0)

## 2016-11-03 LAB — COMPREHENSIVE METABOLIC PANEL
ALT: 12 U/L (ref 0–55)
AST (SGOT): 16 U/L (ref 10–42)
Albumin/Globulin Ratio: 0.93 Ratio (ref 0.70–1.50)
Albumin: 3.7 gm/dL (ref 3.5–5.0)
Alkaline Phosphatase: 53 U/L (ref 40–145)
Anion Gap: 13.3 mMol/L (ref 7.0–18.0)
BUN / Creatinine Ratio: 21 Ratio (ref 10.0–30.0)
BUN: 17 mg/dL (ref 7–22)
Bilirubin, Total: <0.3 mg/dL (ref 0.1–1.2)
CO2: 21.3 mMol/L (ref 20.0–30.0)
Calcium: 9.2 mg/dL (ref 8.5–10.5)
Chloride: 107 mMol/L (ref 98–110)
Creatinine: 0.81 mg/dL (ref 0.60–1.20)
EGFR: 98 mL/min/{1.73_m2} (ref 60–150)
Globulin: 4 gm/dL (ref 2.0–4.0)
Glucose: 89 mg/dL (ref 70–99)
Osmolality Calc: 277 mOsm/kg (ref 275–300)
Potassium: 3.6 mMol/L (ref 3.5–5.3)
Protein, Total: 7.7 gm/dL (ref 6.0–8.3)
Sodium: 138 mMol/L (ref 136–147)

## 2016-11-03 LAB — PT/INR
PT INR: 1.1 (ref 0.5–1.3)
PT: 10.9 s (ref 9.5–11.5)

## 2016-11-03 LAB — HCG, SERUM, QUALITATIVE: BHCG Qualitative: NEGATIVE

## 2016-11-03 LAB — LACTIC ACID, PLASMA: Lactic Acid: 1.04 mMol/L (ref 0.50–2.10)

## 2016-11-03 MED ORDER — ONDANSETRON HCL 4 MG PO TABS
4.0000 mg | ORAL_TABLET | Freq: Three times a day (TID) | ORAL | 0 refills | Status: DC | PRN
Start: 2016-11-03 — End: 2017-05-01

## 2016-11-03 MED ORDER — METHYLPREDNISOLONE SODIUM SUCC 40 MG IJ SOLR
INTRAMUSCULAR | Status: AC
Start: 2016-11-03 — End: ?
  Filled 2016-11-03: qty 1

## 2016-11-03 MED ORDER — METHYLPREDNISOLONE SODIUM SUCC 125 MG IJ SOLR
40.0000 mg | Freq: Once | INTRAMUSCULAR | Status: DC
Start: 2016-11-03 — End: 2016-11-03

## 2016-11-03 MED ORDER — DICYCLOMINE HCL 10 MG PO CAPS
ORAL_CAPSULE | ORAL | Status: AC
Start: 2016-11-03 — End: ?
  Filled 2016-11-03: qty 1

## 2016-11-03 MED ORDER — VH HYDROMORPHONE HCL PF 1 MG/ML CARPUJECT
1.0000 mg | Freq: Once | INTRAMUSCULAR | Status: DC
Start: 2016-11-03 — End: 2016-11-03

## 2016-11-03 MED ORDER — SODIUM CHLORIDE 0.9 % IV BOLUS
1000.0000 mL | Freq: Once | INTRAVENOUS | Status: AC
Start: 2016-11-03 — End: 2016-11-03
  Administered 2016-11-03: 1000 mL via INTRAVENOUS

## 2016-11-03 MED ORDER — ONDANSETRON HCL 4 MG/2ML IJ SOLN
4.0000 mg | Freq: Once | INTRAMUSCULAR | Status: AC
Start: 2016-11-03 — End: 2016-11-03
  Administered 2016-11-03: 4 mg via INTRAVENOUS

## 2016-11-03 MED ORDER — ONDANSETRON HCL 4 MG/2ML IJ SOLN
INTRAMUSCULAR | Status: AC
Start: 2016-11-03 — End: ?
  Filled 2016-11-03: qty 2

## 2016-11-03 MED ORDER — DICYCLOMINE HCL 10 MG PO CAPS
10.0000 mg | ORAL_CAPSULE | Freq: Once | ORAL | Status: DC
Start: 2016-11-03 — End: 2016-11-03

## 2016-11-03 MED ORDER — OXYCODONE-ACETAMINOPHEN 5-325 MG PO TABS
1.0000 | ORAL_TABLET | ORAL | 0 refills | Status: DC | PRN
Start: 2016-11-03 — End: 2016-12-31

## 2016-11-03 NOTE — ED Notes (Signed)
Nurse attempted to give ordered medications, pt stated she did not want to take bentyl because she is allergic. Nurse updated allergy list. Pt also refused solu-medrol due to taking lots of steroids the past couple weeks

## 2016-11-03 NOTE — ED Provider Notes (Signed)
[facility]  EMERGENCY DEPARTMENT  History and Physical Exam     Patient Name: [name], [name]  Encounter Date:  11/03/2016  Attending Physician: Elmon Else. Grace Isaac, M.D.   Room:  C17/C17-A  Patient DOB:  1985/12/30  Age: 30 y.o. female  MRN:  [id number]  PCP: Vivia Ewing, NP      MDM:  Diagnosis / Disposition:     The patient presents with abdominal pain without signs of peritonitis or other life-threatening or serious etiology. Many etiologies of the pain were considered such as appendicitis, bowel obstruction, and thought unlikely based on evaluation and presentation.  However the patient was given precautions and instructions to return if symptoms worsen or change in any way, or in 8-12 hours if not improved for re-evaluation.  Follow-up with the patient's primary care physician was encouraged.  Patient was well-appearing on serial reevaluation at time of disposition and given abdominal pain precautions.  Diagnostic impression and plan were discussed with the patient and/or family.  Results of lab/radiology tests were discussed with the patient and/or family. All questions were answered and concerns addressed.    In addition to the above history, please see nursing notes. Allergies, meds, past medical, family, social hx, and the results of the diagnostic studies performed have been reviewed by myself.      Final Impression  1. Abdominal pain in female    2. Nausea    3. Crohn's disease with complication, unspecified gastrointestinal tract location        Disposition  ED Disposition     ED Disposition Condition Date/Time Comment    Discharge  Mon Nov 03, 2016  3:13 AM Baldemar Friday discharge to home/self care.    Condition at disposition: Stable          Follow up  Vivia Ewing, NP  [address]  [place] [address]  [phone]    Schedule an appointment as soon as possible for a visit  as needed, if symptoms worsen    Your GI    Schedule an appointment as soon  as possible for a visit  as needed, if symptoms worsen      Prescriptions  Discharge Medication List as of 11/03/2016  3:14 AM      START taking these medications    Details   ondansetron (ZOFRAN) 4 MG tablet Take 1 tablet (4 mg total) by mouth every 8 (eight) hours as needed for Nausea., Starting Mon 11/03/2016, Print      oxyCODONE-acetaminophen (PERCOCET) 5-325 MG per tablet Take 1 tablet by mouth every 4 (four) hours as needed for Pain.RX:  ___ out of ___ prescriptions, Starting Mon 11/03/2016, Print               History of Presenting Illness:     Vitals: Blood pressure 109/62, pulse (!) 105, temperature 97.8 F (36.6 C), temperature source Oral, resp. rate 19, height 1.549 m, weight (!) 44.1 kg, last menstrual period 09/28/2016, SpO2 98 %, currently breastfeeding.    Chief complaint: Abdominal Pain and Emesis    HPI/ROS is limited by: none  HPI/ROS given by: patient    Location: abdomen  Quality: sharp  Duration: days  Severity: moderate    [name] is a 30 y.o. female who presents to the ED with complaint of abdominal pain.  Patient notes that she has had sharp abdominal pain.  Patient notes nausea, emesis, diarrhea as well.  Patient notes that she has history  of crohn's.  Patient was recently admitted for this.  Patient has been following up with her GI, but they are having issues ordering a medication for her.  Patient denies other symptoms.  Patient denies other issues or complaints.        Review of Systems:  Physical Exam:     Review of Systems   Constitutional: Negative for chills, diaphoresis and fever.   HENT: Negative for congestion, ear pain, rhinorrhea and sore throat.    Eyes: Negative.    Respiratory: Negative for cough, chest tightness, shortness of breath and wheezing.    Cardiovascular: Negative for chest pain, palpitations and leg swelling.   Gastrointestinal: Positive for abdominal pain, diarrhea, nausea and vomiting. Negative for blood in stool.   Endocrine: Negative.       Genitourinary: Negative for dysuria, flank pain, frequency, hematuria, urgency, vaginal bleeding and vaginal discharge.   Musculoskeletal: Negative for back pain, myalgias and neck pain.   Skin: Negative for rash and wound.   Allergic/Immunologic: Negative.    Neurological: Negative for dizziness, weakness, light-headedness and headaches.   Hematological: Negative.    Psychiatric/Behavioral: Negative for dysphoric mood, self-injury and suicidal ideas. The patient is not nervous/anxious.    All other systems reviewed and are negative.      Physical Exam   Constitutional: She is oriented to person, place, and time. She appears well-developed and well-nourished. No distress.   HENT:   Head: Normocephalic and atraumatic.   Right Ear: External ear normal.   Left Ear: External ear normal.   Nose: Nose normal.   Mouth/Throat: Oropharynx is clear and moist. No oropharyngeal exudate.   Eyes: Conjunctivae and EOM are normal. Pupils are equal, round, and reactive to light. Right eye exhibits no discharge. Left eye exhibits no discharge. No scleral icterus.   Neck: Normal range of motion. Neck supple. No JVD present. No tracheal deviation present. No thyromegaly present.   Cardiovascular: Normal rate, regular rhythm, normal heart sounds and intact distal pulses.  Exam reveals no gallop and no friction rub.    No murmur heard.  Pulmonary/Chest: Effort normal and breath sounds normal. No stridor. No respiratory distress. She has no wheezes. She has no rales. She exhibits no tenderness.   Abdominal: Soft. Bowel sounds are normal. She exhibits no distension and no mass. There is no tenderness. There is no rebound and no guarding.   Musculoskeletal: Normal range of motion. She exhibits no edema or tenderness.   Neurological: She is alert and oriented to person, place, and time. She has normal reflexes. No cranial nerve deficit. She exhibits normal muscle tone. Coordination normal.   Skin: Skin is warm and dry. No rash noted. She  is not diaphoretic. No erythema. No pallor.   Psychiatric: She has a normal mood and affect. Her behavior is normal. Judgment and thought content normal.   Nursing note and vitals reviewed.         Allergies / Medications:     Pt is allergic to bentyl [dicyclomine]; morphine; bactrim [sulfamethoxazole w/trimethoprim (co-trimoxazole)]; and sulfa antibiotics.    Discharge Medication List as of 11/03/2016  3:14 AM      CONTINUE these medications which have NOT CHANGED    Details   clonazePAM (KLONOPIN) 1 MG tablet Take 1 mg by mouth daily., Until Discontinued, Historical Med      Ibuprofen (MOTRIN PO) Take by mouth as needed.   , Until Discontinued, Historical Med      levothyroxine (SYNTHROID, LEVOTHROID) 88 MCG  tablet Take 88 mcg by mouth daily., Until Discontinued, Historical Med      Multiple Vitamins-Minerals (HAIR/SKIN/NAILS) Tab Take 1 Tab by Mouth Once a Day. Indications: Vitamin Deficiency, Until Discontinued, Historical Med      Zolpidem Tartrate (AMBIEN PO) Take 10 mg by mouth nightly as needed.   , Until Discontinued, Historical Med      adalimumab (HUMIRA PEN) 40 MG/0.8ML Pen-injector Kit Inject 40 mg into the skin every 14 (fourteen) days., Historical Med               Past History:     Medical: Pt has a past medical history of Abortion; Anxiety; Crohn's disease; H/O Hashimoto thyroiditis; PCOS (polycystic ovarian syndrome); and Tachycardia.    Surgical: Pt  has a past surgical history that includes Breast Implant; Induced abortion; D & C, SUCTION (N/A, 08/24/2014); and Colonoscopy.    Family: The family history is not on file.    Social: Pt reports that she has never smoked. She has never used smokeless tobacco. She reports that she drinks alcohol. She reports that she does not use drugs.        Diagnostic Results:     The results of the diagnostic studies have been reviewed by myself:    Radiologic Studies  No results found.    Lab Studies  Labs Reviewed   CBC AND DIFFERENTIAL - Abnormal; Notable for the  following:        Result Value    Hemoglobin 10.5 (*)     Hematocrit 33.0 (*)     MCH 27 (*)     PLT CT 675 (*)     NEUTROPHIL % 38.0 (*)     Lymphocytes 47.6 (*)     All other components within normal limits   COMPREHENSIVE METABOLIC PANEL   LACTIC ACID, PLASMA   PT/INR   HCG, SERUM, QUALITATIVE           Procedure / EKG / Image:       EKG (interpreted by ED physician):   Last EKG Result     None                ED Course:     ED Course as of Nov 03 314   [facility] Nov 03, 2016   0127 Advised of assessment and of treatment plan to be employed while in ER. Answered all questions.     67 Advised of Lab studies results. Advised of discharge plan, treatment plan, and follow up instructions.  Was given a list of primary care doctors in the area who are accepting new patients if the patient does not have a PMD or would like a new one; in addition to any specialist care, if appropriate/needed.  Advised to return to the ER should symptoms persist or get worse. Answered all questions.             ATTESTATIONS     This chart was generated by an EMR and may contain errors or additions/omissions not intended by the user.    The documentation recorded by my scribe, Jafet del Lyden, which reflects the services I personally performed and the decisions made by me, Elmon Else. Grace Isaac, M.D.  I understand and acknowledge that I am responsible for the accuracy of the documentation    Elmon Else. Grace Isaac, M.D.          Etter Sjogren, MD  11/08/16 1210

## 2016-11-03 NOTE — Discharge Instructions (Signed)
Abdominal Pain    Abdominal pain is pain in the stomach or belly area. Everyone has this pain from time to time. In many cases it goes away on its own. But abdominal pain can sometimes be due to a serious problem, such as appendicitis. So it's important to know when to seek help.  Causes of abdominal pain  There are many possible causes of abdominal pain. Common causes in adults include:   Constipation, diarrhea, or gas   Stomach acid flowing back up into the esophagus (acid reflux or heartburn)   Severe acid reflux, called GERD (gastroesophageal reflux disease)   A sore in the lining of the stomach or small intestine (peptic ulcer)   Inflammation of the gallbladder, liver,or pancreas   Gallstones or kidney stones   Appendicitis   Intestinal blockage   An internal organ pushing through a muscle or other tissue (hernia)   Urinary tract infections   In women, menstrual cramps, fibroids, or endometriosis   Inflammation or infection of the intestines  Diagnosing the cause of abdominal pain  Your healthcare provider will do a physical exam help find the cause of your pain. If needed, tests will be ordered. Belly pain has many possible causes. So it can be hard to find the reason for your pain. Giving details about your pain can help. Tell your provider where and when you feel the pain, and what makes it better or worse. Also let your provider know if you have other symptoms such as:   Fever   Tiredness   Upset stomach (nausea)   Vomiting   Changes in bathroom habits  Treating abdominal pain  Some causes of pain need emergency medical treatment right away. These include appendicitis or a bowel blockage. Other problems can be treated with rest, fluids, or medicines. Your healthcare provider can give you specific instructions for treatment or self-care based on what is causing your pain.  If you have vomiting or diarrhea,sip water or other clear fluids. When you are ready to eat solid foods again,  start with small amounts of easy-to-digest, low-fat foods. These include apple sauce, toast, or crackers.   When to seek medical care  Call 911or go to the hospital right away if you:   Can't pass stool and are vomiting   Are vomiting blood or have bloody diarrhea or black, tarry diarrhea   Have chest, neck, or shoulder pain   Feel like you might pass out   Have pain in your shoulder blades with nausea   Have sudden, severe belly pain   Have new, severepain unlike any you have felt before   Have a belly that is rigid, hard, and tender to touch  Call your healthcare provider if you have:   Pain for more than5days   Bloating for more than 2days   Diarrhea for more than5days   A fever of 100.4F (38.0C) or higher, or as directed by your provider   Pain that gets worse   Weight loss for no reason   Continued lack of appetite   Blood in your stool  How to prevent abdominal pain  Here are some tips to help prevent abdominal pain:   Eat smaller amounts of food at one time.   Avoid greasy, fried, or other high-fat foods.   Avoid foods that give you gas.   Exercise regularly.   Drink plenty of fluids.  To help prevent GERD symptoms:   Quit smoking.   Reduce alcohol and certain foods that   increase stomach acid.   Avoid aspirin and over-the-counter pain and fever medicines (NSAIDS or nonsteroidal anti-inflammatory drugs), if possible   Lose extra weight.   Finish eating at least 2 hours before you go to bed or lie down.   Raise the head of your bed.  Date Last Reviewed: 06/29/2015   2000-2016 [employer] [address] [place], [place] [address]. All rights reserved. This information is not intended as a substitute for professional medical care. Always follow your healthcare professional's instructions.          Crohn's Disease    Crohn's disease isinflammation of the intestinal tract that comes and goes in flare-ups. This is a chronic(long-term)illness. During a flare-up,  intense abdominal pain and fever may be felt. Mucus, blood, or pus may appear in the stool. Between flare ups, inflammation lessens and there usually are no symptoms. Crohn's disease is a form of inflammatory bowel disease (IBD).  Symptoms of Crohn's disease may include:   Abdominal cramps and pain   Diarrhea, usually bloody, alternating with constipation   Mucus in stools   Rectal bleeding   Rectal pain   Fever   Low energy   Decreased appetite and weight loss  No one knows what exactly causes Crohn's disease, and there is no cure. The goal of treatment is to control and relieve symptoms and prevent complications, so you can lead a full and active life. No single treatment works for everyone, but many things can be done to help.  Diet  Foods did not cause your Crohn's, but they can affect it. Unfortunately, there is no one diet that works for everyone. Below aresome things to try.Keep a food log to figure out what you are sensitive to.   Eat more slowly and smaller amounts at a time, but more often. Remember, you can always eat more, but cannot eat less once you've eaten too much.   High fiber foods are complicated. While they may help constipation they can make the bloating, cramping, gas, and diarrhea worse.   Try avoiding dairy products, sometimes this helps   Try cutting out foods that are high in fat and fatty meats   Eat less sugar   Bloating or passing excess gas may be controlled. Be careful with "gassy" vegetables and fruits like beans, cabbage, broccoli, and cauliflower.   Be careful of carbonated beverages and fruit juices. They can make the bloating and diarrhea worse.   Caffeine, alcohol, and stimulants may worsen symptoms. These include coffee, tea, sodas, energy drinks, and chocolate.  Lifestyle  Although stress does not cause Crohn's, it is often a factor in flare-ups. It can also affect how you feel about and cope with your condition.   Look for factors that seem to worsen your  symptoms such as stress and emotions.   Counseling can often help relieve stress. So canself-help measures such as exercise, yoga, and meditation.   Depression can be a part of this illness and antidepressants may be prescribed. This may actually help with diarrhea, constipation, and cramping, as well as depression.   Smoking doesn't cause Crohn's, but can make the symptoms worse.  Medicines  Your healthcare provider may prescribe medicines. If so, take them as directed. For acute flare-ups, prescription medicines can be prescribed. Contact your provider if you need this.   Ask your healthcare provider before taking any antidiarrheal medicines.   Avoid anti-inflammatory medicines like ibuprofen or naproxen.   Consider nutritional supplements. This is especially true  if the diarrhea is prolonged, or you aren't eating or are losing weight.  Follow-up care  Follow up with your healthcare provider, oras advised.If a stool samplewastaken or cultures were done, you will be told if your treatment needs to change.Call as directedfor the results.  Support  Support groups for persons Crohn's disease can be a source of useful information on how others are coping with this illness. They are available in person, on the phone, or via the Internet. Contact the following resources for more information.   [facility]'s [facility] [employer] [facility] [place], [employer]. 707-457-2422 www.[employer].cz   National Digestive Diseases Information Clearinghouse (NDDIC) 346-341-5466 www.digestive.[employer].si  When to seek medical advice  Call your healthcare provider right awayif any of these occur:   Fever of 100.47F(38C) or higher, or as directed by your healthcare provider   Abdominal pain that doesn't get better when you take the usual measures   Mucus, pus, or blood in the stool (dark or bright red)   Repeated vomiting   Abdominal swelling and pain that doesn't go away after a few hours  Call 911  Call 911 if any of these  occur:   Trouble breathing   Confusion   Extreme sleepiness or trouble waking up   Fainting or loss of consciousness   Rapid heart rate  Date Last Reviewed: 08/28/2014   2000-2016 [employer] [address], [place], [place] [address]. All rights reserved. This information is not intended as a substitute for professional medical care. Always follow your healthcare professional's instructions.          Discharge Instructions for Crohn's Disease  You have been diagnosed with Crohn's disease. Your digestive tract is swollen and inflamed. All layers of your digestive tract may be affected. Although there is no cure for Crohn's disease,you can receive treatment for the symptoms. Help manage your symptoms by following your healthcare provider's advice and watching what you eat.  Home care  Recommendations for taking care of yourself at home include the following:   Work closely with your healthcare provider to determine the types of treatment that are best for you.   Take your medicines exactly as directed.   Let your healthcare provider know if you are having uncomfortable side effects.   Don't stop taking your medicines without talking with your healthcare provider first.   It may be helpful to avoid certain foods for a little while. Depending on your condition, these may include caffeine (coffee, tea, and cola), spicy foods, milkproducts, and raw fruits and vegetables. For certain people, these can be hard to digest and can worsen symptoms in a flare-up. Your healthcare provider may have you work with a nutritionist to come up with the best food choices for you.   Try eating several small meals a day instead of 3 large ones.   Don't smoke. Tobacco smoking makes the disease worse.   Keep appointments for regular checkups even if you are not having symptoms.   Talk to your healthcare provider about surgery for Crohn's disease. Surgery won't cure Crohn's disease, but it may help control the  symptoms. Only you and your healthcare provider can decide if this choice is right for you.   Learn more about your condition. Contact the Crohn's and Colitis Foundation toll-free 737-107-2569 or [employer]edIn[employer].pl  Managing nutrition  You may be able to eat most foods until you have a flare-up. But like anyone else, you need to make healthy eating choices. Some of the healthiest foods  can make symptoms worse, though. Keeping track of your "problem foods" may be helpful. Ask your healthcare provider any questions you have about healthy eating.    When to call your healthcare provider  Call your healthcare provider right away if you have any of the following:   Severe pain or bloating in your belly after meals   Sores in your mouth   Sores in your anal area (around your rectum)   Fever of100.76F (38C) or higher, or as directed by your healthcare provider   Poor appetite or weight loss   Bloody diarrhea   Nausea or vomiting   Skin rashes or skinthat weeps (or drains)   Changes in your vision   Date Last Reviewed: 07/30/2015   2000-2016 [employer]. [address] [place], [place] [address]. All rights reserved. This information is not intended as a substitute for professional medical care. Always follow your healthcare professional's instructions.

## 2016-12-31 ENCOUNTER — Emergency Department
Admission: EM | Admit: 2016-12-31 | Discharge: 2016-12-31 | Disposition: A | Discharge status: Home / Self Care | Payer: BC Managed Care – PPO | Attending: Emergency Medicine | Admitting: Emergency Medicine

## 2016-12-31 ENCOUNTER — Emergency Department: Payer: BC Managed Care – PPO

## 2016-12-31 DIAGNOSIS — M542 Cervicalgia: Secondary | ICD-10-CM | POA: Insufficient documentation

## 2016-12-31 DIAGNOSIS — E063 Autoimmune thyroiditis: Secondary | ICD-10-CM | POA: Insufficient documentation

## 2016-12-31 DIAGNOSIS — M545 Low back pain, unspecified: Secondary | ICD-10-CM

## 2016-12-31 DIAGNOSIS — Y92410 Unspecified street and highway as the place of occurrence of the external cause: Secondary | ICD-10-CM | POA: Insufficient documentation

## 2016-12-31 DIAGNOSIS — S0990XA Unspecified injury of head, initial encounter: Secondary | ICD-10-CM | POA: Insufficient documentation

## 2016-12-31 MED ORDER — METHOCARBAMOL 500 MG PO TABS
500.0000 mg | ORAL_TABLET | Freq: Three times a day (TID) | ORAL | 0 refills | Status: AC | PRN
Start: 2016-12-31 — End: 2017-01-05

## 2016-12-31 MED ORDER — METHOCARBAMOL 500 MG PO TABS
500.0000 mg | ORAL_TABLET | Freq: Once | ORAL | Status: AC
Start: 2016-12-31 — End: 2016-12-31
  Administered 2016-12-31: 500 mg via ORAL
  Filled 2016-12-31: qty 1

## 2016-12-31 MED ORDER — ACETAMINOPHEN-CODEINE #3 300-30 MG PO TABS
1.0000 | ORAL_TABLET | Freq: Once | ORAL | Status: AC
Start: 2016-12-31 — End: 2016-12-31
  Administered 2016-12-31: 1 via ORAL
  Filled 2016-12-31: qty 1

## 2016-12-31 MED ORDER — ACETAMINOPHEN-CODEINE 300-30 MG PO TABS
1.0000 | ORAL_TABLET | Freq: Four times a day (QID) | ORAL | 0 refills | Status: DC | PRN
Start: 2016-12-31 — End: 2017-01-11
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
## 2017-01-01 ENCOUNTER — Telehealth: Payer: Self-pay | Admitting: Family
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
## 2017-01-09 ENCOUNTER — Observation Stay: Payer: BC Managed Care – PPO | Admitting: Nephrology

## 2017-01-09 ENCOUNTER — Observation Stay
Admission: EM | Admit: 2017-01-09 | Discharge: 2017-01-11 | Disposition: A | Discharge status: Home / Self Care | Payer: BC Managed Care – PPO | Attending: Family Medicine | Admitting: Family Medicine

## 2017-01-09 DIAGNOSIS — K6289 Other specified diseases of anus and rectum: Secondary | ICD-10-CM | POA: Insufficient documentation

## 2017-01-09 DIAGNOSIS — R110 Nausea: Secondary | ICD-10-CM | POA: Insufficient documentation

## 2017-01-09 DIAGNOSIS — G8929 Other chronic pain: Secondary | ICD-10-CM | POA: Insufficient documentation

## 2017-01-09 DIAGNOSIS — R197 Diarrhea, unspecified: Secondary | ICD-10-CM | POA: Insufficient documentation

## 2017-01-09 DIAGNOSIS — Z87440 Personal history of urinary (tract) infections: Secondary | ICD-10-CM | POA: Insufficient documentation

## 2017-01-09 DIAGNOSIS — E063 Autoimmune thyroiditis: Secondary | ICD-10-CM | POA: Insufficient documentation

## 2017-01-09 DIAGNOSIS — E282 Polycystic ovarian syndrome: Secondary | ICD-10-CM | POA: Insufficient documentation

## 2017-01-09 DIAGNOSIS — R300 Dysuria: Secondary | ICD-10-CM | POA: Insufficient documentation

## 2017-01-09 DIAGNOSIS — R1031 Right lower quadrant pain: Principal | ICD-10-CM | POA: Insufficient documentation

## 2017-01-09 DIAGNOSIS — K5090 Crohn's disease, unspecified, without complications: Secondary | ICD-10-CM | POA: Insufficient documentation

## 2017-01-09 DIAGNOSIS — K50919 Crohn's disease, unspecified, with unspecified complications: Secondary | ICD-10-CM

## 2017-01-09 LAB — URINALYSIS, REFLEX TO MICROSCOPIC EXAM IF INDICATED
Bilirubin, UA: NEGATIVE
Blood, UA: NEGATIVE
Glucose, UA: NEGATIVE
Ketones UA: 20 — AB
Nitrite, UA: NEGATIVE
Protein, UR: NEGATIVE
Specific Gravity UA: 1.023 (ref 1.001–1.035)
Urine pH: 6 (ref 5.0–8.0)
Urobilinogen, UA: 2 mg/dL

## 2017-01-09 LAB — COMPREHENSIVE METABOLIC PANEL
ALT: 10 U/L (ref 0–55)
AST (SGOT): 19 U/L (ref 5–34)
Albumin/Globulin Ratio: 1 (ref 0.9–2.2)
Albumin: 3.7 g/dL (ref 3.5–5.0)
Alkaline Phosphatase: 61 U/L (ref 37–106)
Anion Gap: 6 (ref 5.0–15.0)
BUN: 12 mg/dL (ref 7.0–19.0)
Bilirubin, Total: 0.2 mg/dL (ref 0.2–1.2)
CO2: 24 mEq/L (ref 22–29)
Calcium: 8.9 mg/dL (ref 8.5–10.5)
Chloride: 106 mEq/L (ref 100–111)
Creatinine: 0.8 mg/dL (ref 0.6–1.0)
Globulin: 3.7 g/dL — ABNORMAL HIGH (ref 2.0–3.6)
Glucose: 90 mg/dL (ref 70–100)
Potassium: 4.2 mEq/L (ref 3.5–5.1)
Protein, Total: 7.4 g/dL (ref 6.0–8.3)
Sodium: 136 mEq/L (ref 136–145)

## 2017-01-09 LAB — CBC AND DIFFERENTIAL
Absolute NRBC: 0 10*3/uL
Basophils Absolute Automated: 0.03 10*3/uL (ref 0.00–0.20)
Basophils Automated: 0.5 %
Eosinophils Absolute Automated: 0.02 10*3/uL (ref 0.00–0.70)
Eosinophils Automated: 0.3 %
Hematocrit: 37.2 % (ref 37.0–47.0)
Hgb: 11.3 g/dL — ABNORMAL LOW (ref 12.0–16.0)
Immature Granulocytes Absolute: 0.02 10*3/uL
Immature Granulocytes: 0.3 %
Lymphocytes Absolute Automated: 1.89 10*3/uL (ref 0.50–4.40)
Lymphocytes Automated: 30 %
MCH: 26.3 pg — ABNORMAL LOW (ref 28.0–32.0)
MCHC: 30.4 g/dL — ABNORMAL LOW (ref 32.0–36.0)
MCV: 86.5 fL (ref 80.0–100.0)
MPV: 9.3 fL — ABNORMAL LOW (ref 9.4–12.3)
Monocytes Absolute Automated: 0.56 10*3/uL (ref 0.00–1.20)
Monocytes: 8.9 %
Neutrophils Absolute: 3.77 10*3/uL (ref 1.80–8.10)
Neutrophils: 60 %
Nucleated RBC: 0 /100 WBC (ref 0.0–1.0)
Platelets: 404 10*3/uL — ABNORMAL HIGH (ref 140–400)
RBC: 4.3 10*6/uL (ref 4.20–5.40)
RDW: 17 % — ABNORMAL HIGH (ref 12–15)
WBC: 6.29 10*3/uL (ref 3.50–10.80)

## 2017-01-09 LAB — HEMOLYSIS INDEX: Hemolysis Index: 5 (ref 0–18)

## 2017-01-09 LAB — GFR: EGFR: >60

## 2017-01-09 LAB — LIPASE: Lipase: <4 U/L — ABNORMAL LOW (ref 8–78)

## 2017-01-09 MED ORDER — FENTANYL CITRATE (PF) 50 MCG/ML IJ SOLN (WRAP)
50.0000 ug | Freq: Once | INTRAMUSCULAR | Status: AC
Start: 2017-01-09 — End: 2017-01-09
  Administered 2017-01-09: 50 ug via INTRAVENOUS
  Filled 2017-01-09: qty 2

## 2017-01-09 MED ORDER — SODIUM CHLORIDE 0.9 % IV BOLUS
1000.0000 mL | Freq: Once | INTRAVENOUS | Status: AC
Start: 2017-01-09 — End: 2017-01-09
  Administered 2017-01-09: 1000 mL via INTRAVENOUS

## 2017-01-09 MED ORDER — METOCLOPRAMIDE HCL 5 MG/ML IJ SOLN
10.0000 mg | Freq: Once | INTRAMUSCULAR | Status: AC
Start: 2017-01-09 — End: 2017-01-09
  Administered 2017-01-09: 10 mg via INTRAVENOUS
  Filled 2017-01-09: qty 2

## 2017-01-09 MED ORDER — ONDANSETRON HCL 4 MG/2ML IJ SOLN
4.0000 mg | Freq: Once | INTRAMUSCULAR | Status: AC
Start: 2017-01-09 — End: 2017-01-09
  Administered 2017-01-09: 4 mg via INTRAVENOUS
  Filled 2017-01-09: qty 2

## 2017-01-09 MED ORDER — PROMETHAZINE HCL 25 MG/ML IJ SOLN
12.5000 mg | Freq: Once | INTRAMUSCULAR | Status: AC
Start: 2017-01-09 — End: 2017-01-09
  Administered 2017-01-09: 12.5 mg via INTRAVENOUS
  Filled 2017-01-09: qty 1

## 2017-01-09 MED ORDER — LORAZEPAM 2 MG/ML IJ SOLN
1.0000 mg | Freq: Once | INTRAMUSCULAR | Status: AC
Start: 2017-01-09 — End: 2017-01-09
  Administered 2017-01-09: 1 mg via INTRAVENOUS
  Filled 2017-01-09: qty 1

## 2017-01-09 NOTE — ED Notes (Signed)
NURSING NOTE FOR THE RECEIVING INPATIENT NURSE   ED NURSE [place] [facility]   [facility] [address]   ADMISSION INFORMATION   [name] is a 31 y.o. female admitted with a diagnosis of:    1. Exacerbation of Crohn's disease with complication       NURSING CARE   LOC Patient is AAox4   ADL Self care; ambulatory   Pertinent Information Had 3 episodes of diarrhea. Fentanyl 50 mcg given for pain.    VITAL SIGNS     Vitals:    01/09/17 2202   BP: 96/53   Pulse: 79   Resp: 16   Temp:    SpO2: 97%        IV LINES     Peripheral IV 01/09/17 Left Antecubital gauge 20     Site Assessment Clean;Dry;Intact 01/09/2017  5:18 PM   Line Status Saline Locked 01/09/2017  5:18 PM   Dressing Status Clean;Dry;Intact 01/09/2017  5:18 PM   Number of days: 0          LAB RESULTS     Labs Reviewed   COMPREHENSIVE METABOLIC PANEL - Abnormal; Notable for the following:        Result Value    Globulin 3.7 (*)     All other components within normal limits   CBC AND DIFFERENTIAL - Abnormal; Notable for the following:     Hgb 11.3 (*)     Platelets 404 (*)     MCH 26.3 (*)     MCHC 30.4 (*)     RDW 17 (*)     MPV 9.3 (*)     All other components within normal limits   LIPASE - Abnormal; Notable for the following:     Lipase <4 (*)     All other components within normal limits   URINALYSIS, REFLEX TO MICROSCOPIC EXAM IF INDICATED - Abnormal; Notable for the following:     Leukocyte Esterase, UA Trace (*)     Ketones UA 20 (*)     All other components within normal limits   HEMOLYSIS INDEX   GFR

## 2017-01-09 NOTE — ED Triage Notes (Signed)
Crohn's flare started earlier today, unable to eat or drink.

## 2017-01-09 NOTE — H&P (Signed)
[facility]      Patient: [name]  Date: 01/09/2017   DOB: [birth year]  Admission Date: 01/09/2017   MRN: [id number]  Attending: Nelson Chimes         Chief Complaint   Patient presents with   . Abdominal Pain   . Crohn's Flare       History Gathered From: patient    HISTORY AND PHYSICAL     [name] is a 31 y.o. female with a PMHx of hypothyroidism and Crohn's disease who presented with Right lower quadrant abdominal pain. The pain started today in the morning, localized to the RLQ, sharp and constant with intensity of 7-8/10 and non radiating. She takes [place] Q 2 weeks for Crohn's disease and had CT abdomen and pelvis about a week ago and no further treatment was suggested by her GI. No fever, chills, diarrhea or vomiting. C/o occasional nausea. In the ED she was given fentanyl but the pain persisted. The case was discussed With GI service by ED and agreed to observe the patient in the Hospital    Past Medical History:   Diagnosis Date   . Abortion    . Anxiety    . Crohn's disease    . H/O Hashimoto thyroiditis     hypothyroidism   . PCOS (polycystic ovarian syndrome)    . Tachycardia        Past Surgical History:   Procedure Laterality Date   . BREAST IMPLANT     . COLONOSCOPY     . D & C, SUCTION N/A 08/24/2014    Procedure: D & C, SUCTION;  Surgeon: Maryruth Hancock, MD;  Location: [facility];  Service: Obstetrics;  Laterality: N/A;   . INDUCED ABORTION         Prior to Admission medications    Medication Sig Start Date End Date Taking? Authorizing Provider   adalimumab (HUMIRA PEN) 40 MG/0.8ML Pen-injector Kit Inject 40 mg into the skin every 14 (fourteen) days.   Yes [provider]   ALPRAZolam Prudy Feeler) 0.5 MG tablet Take 0.5 mg by mouth nightly as needed.   Yes [provider]   levothyroxine (SYNTHROID, LEVOTHROID) 88 MCG tablet Take 88 mcg by mouth daily.   Yes [provider]   Zolpidem Tartrate (AMBIEN PO) Take 10 mg by mouth nightly as  needed.      Yes [provider]   Acetaminophen-Codeine (TYLENOL/CODEINE #3) 300-30 MG per tablet Take 1-2 tablets by mouth every 6 (six) hours as needed for Pain.for up to 12 doses 12/31/16   Duhame, Adria Devon, NP   Ibuprofen (MOTRIN PO) Take by mouth as needed.       [provider]   Multiple Vitamins-Minerals (HAIR/SKIN/NAILS) Tab Take 1 Tab by Mouth Once a Day. Indications: Vitamin Deficiency    [provider]   ondansetron (ZOFRAN) 4 MG tablet Take 1 tablet (4 mg total) by mouth every 8 (eight) hours as needed for Nausea. 11/03/16   Etter Sjogren, MD   clonazePAM (KLONOPIN) 1 MG tablet Take 1 mg by mouth daily.  01/09/17  [provider]       Allergies   Allergen Reactions   . Bentyl [Dicyclomine] Hives   . Morphine    . Bactrim [Sulfamethoxazole W/Trimethoprim (Co-Trimoxazole)] Rash   . Sulfa Antibiotics Rash       CODE STATUS: Full code    PRIMARY CARE MD: Vivia Ewing, NP  Family history: UNCLE with history of Crohn's Disease    Social History   Substance Use Topics   . Smoking status: Never Smoker   . Smokeless tobacco: Never Used   . Alcohol use Yes      Comment: rare       REVIEW OF SYSTEMS   Positive for: abdominal pain and nausea  Negative for: no diarrhea or vomiting. No fever or chills  All ROS completed and otherwise negative.    PHYSICAL EXAM     Vital Signs (most recent): BP 96/53   Pulse 79   Temp 98.7 F (37.1 C)   Resp 16   Ht 1.549 m (5\' 1" )   Wt (!) 44.5 kg (98 lb)   SpO2 97%   BMI 18.52 kg/m   Constitutional: No apparent distress. Patient speaks freely in full sentences.   HEENT: NC/AT, PERRL, no scleral icterus or conjunctival pallor, no nasal discharge, MMM, oropharynx without erythema or exudate  Neck: trachea midline, supple, no cervical or supraclavicular lymphadenopathy or masses  Cardiovascular: RRR, normal S1 S2, no murmurs, gallops, palpable thrills, no JVD, Non-displaced PMI.  Respiratory: Normal rate. No retractions or  increased work of breathing. Clear to auscultation and percussion bilaterally.  Gastrointestinal: +BS, non-distended, soft,  no rebound or guarding, no hepatosplenomegaly. Mild RLQ tenderness  Genitourinary: no suprapubic or costovertebral angle tenderness  Musculoskeletal: ROM and motor strength grossly normal. No clubbing, edema, or cyanosis. DP and radial pulses 2+ and symmetric.  Skin exam:  pink  Neurologic: EOMI, CN 2-12 grossly intact. no gross motor or sensory deficits  Psychiatric: AAOx3, affect and mood appropriate. The patient is alert, interactive, appropriate.  Capillary refill:  Normal    Exam done by Nelson Chimes, MD on 01/09/17 at 11:19 PM      LABS & IMAGING     Recent Results (from the past 24 hour(s))   UA, Reflex to Microscopic (pts 3 + yrs)    Collection Time: 01/09/17  7:21 PM   Result Value Ref Range    Urine Type Clean Catch     Color, UA Yellow Clear - Yellow    Clarity, UA Hazy Clear - Hazy    Specific Gravity UA 1.023 1.001 - 1.035    Urine pH 6.0 5.0 - 8.0    Leukocyte Esterase, UA Trace (A) Negative    Nitrite, UA Negative Negative    Protein, UR Negative Negative    Glucose, UA Negative Negative    Ketones UA 20 (A) Negative    Urobilinogen, UA 2.0 0.2 - 2.0 mg/dL    Bilirubin, UA Negative Negative    Blood, UA Negative Negative    RBC, UA 0 - 2 0 - 5 /hpf    WBC, UA 0 - 5 0 - 5 /hpf    Squamous Epithelial Cells, Urine 0 - 5 0 - 25 /hpf   Comprehensive Metabolic Panel (CMP)    Collection Time: 01/09/17  7:32 PM   Result Value Ref Range    Glucose 90 70 - 100 mg/dL    BUN 40.9 7.0 - 81.1 mg/dL    Creatinine 0.8 0.6 - 1.0 mg/dL    Sodium 914 782 - 956 mEq/L    Potassium 4.2 3.5 - 5.1 mEq/L    Chloride 106 100 - 111 mEq/L    CO2 24 22 - 29 mEq/L    Calcium 8.9 8.5 - 10.5 mg/dL    Protein, Total 7.4 6.0 - 8.3 g/dL  Albumin 3.7 3.5 - 5.0 g/dL    AST (SGOT) 19 5 - 34 U/L    ALT 10 0 - 55 U/L    Alkaline Phosphatase 61 37 - 106 U/L    Bilirubin, Total 0.2 0.2 - 1.2 mg/dL    Globulin  3.7 (H) 2.0 - 3.6 g/dL    Albumin/Globulin Ratio 1.0 0.9 - 2.2    Anion Gap 6.0 5.0 - 15.0   CBC with differential    Collection Time: 01/09/17  7:32 PM   Result Value Ref Range    WBC 6.29 3.50 - 10.80 x10 3/uL    Hgb 11.3 (L) 12.0 - 16.0 g/dL    Hematocrit 28.3 15.1 - 47.0 %    Platelets 404 (H) 140 - 400 x10 3/uL    RBC 4.30 4.20 - 5.40 x10 6/uL    MCV 86.5 80.0 - 100.0 fL    MCH 26.3 (L) 28.0 - 32.0 pg    MCHC 30.4 (L) 32.0 - 36.0 g/dL    RDW 17 (H) 12 - 15 %    MPV 9.3 (L) 9.4 - 12.3 fL    Neutrophils 60.0 None %    Lymphocytes Automated 30.0 None %    Monocytes 8.9 None %    Eosinophils Automated 0.3 None %    Basophils Automated 0.5 None %    Immature Granulocyte 0.3 None %    Nucleated RBC 0.0 0.0 - 1.0 /100 WBC    Neutrophils Absolute 3.77 1.80 - 8.10 x10 3/uL    Abs Lymph Automated 1.89 0.50 - 4.40 x10 3/uL    Abs Mono Automated 0.56 0.00 - 1.20 x10 3/uL    Abs Eos Automated 0.02 0.00 - 0.70 x10 3/uL    Absolute Baso Automated 0.03 0.00 - 0.20 x10 3/uL    Absolute Immature Granulocyte 0.02 0 x10 3/uL    Absolute NRBC 0.00 0 x10 3/uL   Lipase    Collection Time: 01/09/17  7:32 PM   Result Value Ref Range    Lipase <4 (L) 8 - 78 U/L   Hemolysis index    Collection Time: 01/09/17  7:32 PM   Result Value Ref Range    Hemolysis Index 5 0 - 18   GFR    Collection Time: 01/09/17  7:32 PM   Result Value Ref Range    EGFR >60.0        MICROBIOLOGY:  Blood Culture:none  Urine Culture: none  Antibiotics Started: None    IMAGING:  Upon my review: none    CARDIAC:  EKG Interpretation (upon my review):  None    Markers:        EMERGENCY DEPARTMENT COURSE:  Orders Placed This Encounter   Procedures   . Comprehensive Metabolic Panel (CMP)   . CBC with differential   . Lipase   . UA, Reflex to Microscopic (pts 3 + yrs)   . Hemolysis index   . GFR   . ED Unit Sec Comm Order   . ED Unit Sec Comm Order   . Saline lock IV   . Place (admit) for Observation Services   . [facility] ED Bed Request (Observation)       ASSESSMENT &  PLAN     [name] is a 31 y.o. female admitted under [facility] with intractable abdominal pain.    Patient Active Hospital Problem List:   1. Intractable RLQ abdominal pain,   - Patient with no fever, diarrhea, or leukocytosis. Recent  CT as an outpatient about a week ago and no additional recommendation was made by her GI doctor  - Will start Iv dilaudid for sever pain and start Pepcid  - to be evaluated by Gi: dr Cam Hai    2. History of Crohn's  - Will obtain ESR and CRP  - Evaluation by GI    3. Hypothyroidism  - continue synthroid      Nutrition  Full liquid    DVT/VTE Prophylaxis  SCD/Lovenox    Anticipated medical stability for discharge: 24- 48 hours    Service status/Reason for ongoing hospitalization: Intractable Pain  Anticipated Discharge Needs: None     Signed,  Nelson Chimes    01/09/2017 11:19 PM  Time Elapsed: 55 minutes

## 2017-01-09 NOTE — ED Provider Notes (Signed)
EMERGENCY DEPARTMENT HISTORY AND PHYSICAL EXAM     Physician/Midlevel provider first contact with patient: 01/09/17 1800         Date: 01/09/2017  Patient Name: [name]    History of Presenting Illness     Chief Complaint   Patient presents with   . Abdominal Pain   . Crohn's Flare       History Provided By: Patient    Chief Complaint: Crohn's disease flare  Duration: Today  Timing: Acute on-chronic  Location: Abdomen  Quality: Aching  Severity: Severe  Modifying factors: Similar to Crohn's disease flare up.   Associated Symptoms: Dysuria, rectal pain  Pertinent Negatives: None    Additional History: [name] is a 31 y.o. female with a medical history of Crohn's disease presenting to the ED with diffuse abdominal pain since earlier today. Pt notes her pain feeling similar to a typical Crohn's disease flare up. Pt reports dysuria and rectal pain along with her abdominal pain. Pt notes recent hospitalization 2 weeks ago for similar issues, had CT done that showed no abscess. Pt sees Dr. Malena Edman) and takes Humira for her Crohn's. Pt notes being hospitalized in the past for her Crohn's flares.      PCP: Vivia Ewing, NP  SPECIALISTS:    Current Facility-Administered Medications   Medication Dose Route Frequency Provider Last Rate Last Dose   . acetaminophen (TYLENOL) tablet 650 mg  650 mg Oral Q6H PRN Orma Flaming B, MD       . enoxaparin (LOVENOX) syringe 40 mg  40 mg Subcutaneous Daily Orma Flaming B, MD       . famotidine (PEPCID) tablet 20 mg  20 mg Oral Q12H [facility] Nelson Chimes, MD        Or   . famotidine (PEPCID) injection 20 mg  20 mg Intravenous Q12H [facility] Orma Flaming B, MD   20 mg at 01/10/17 0030   . HYDROmorphone (DILAUDID) injection 0.4 mg  0.4 mg Intravenous Q4H PRN Orma Flaming B, MD   0.4 mg at 01/10/17 0026   . levothyroxine (SYNTHROID, LEVOTHROID) tablet 88 mcg  88 mcg Oral Daily at 0600 Orma Flaming B, MD       . naloxone [facility] [address])  injection 0.2 mg  0.2 mg Intravenous PRN Orma Flaming B, MD       . ondansetron (ZOFRAN-ODT) disintegrating tablet 4 mg  4 mg Oral Q6H PRN Nelson Chimes, MD        Or   . ondansetron (ZOFRAN) injection 4 mg  4 mg Intravenous Q6H PRN Orma Flaming B, MD       . zolpidem (AMBIEN) tablet 5 mg  5 mg Oral QHS PRN Nelson Chimes, MD   5 mg at 01/10/17 0037       Past History     Past Medical History:  Past Medical History:   Diagnosis Date   . Abortion    . Anxiety    . Crohn's disease    . H/O Hashimoto thyroiditis     hypothyroidism   . PCOS (polycystic ovarian syndrome)    . Tachycardia        Past Surgical History:  Past Surgical History:   Procedure Laterality Date   . BREAST IMPLANT     . COLONOSCOPY     . D & C, SUCTION N/A 08/24/2014    Procedure: D & C, SUCTION;  Surgeon: Maryruth Hancock, MD;  Location: [facility] OR;  Service: Obstetrics;  Laterality: N/A;   . INDUCED ABORTION         Family History:  History reviewed. No pertinent family history.    Social History:  Social History   Substance Use Topics   . Smoking status: Never Smoker   . Smokeless tobacco: Never Used   . Alcohol use Yes      Comment: rare       Allergies:  Allergies   Allergen Reactions   . Bentyl [Dicyclomine] Hives   . Morphine    . Bactrim [Sulfamethoxazole W/Trimethoprim (Co-Trimoxazole)] Rash   . Sulfa Antibiotics Rash       Review of Systems     Constitutional: Negative for fever or chills.   Neurological: Negative for speech changes, weakness, or numbness.  Eyes: Negative for visual changes or eye pain.  HENT: No headache. Negative for sore throat, neck pain, or runny nose.   Cardiovascular: Negative for chest pain.   Respiratory: Negative for shortness of breath.   Gastrointestinal: + abdominal pain. Negative for nausea, vomiting, diarrhea, or blood in stool.   Genitourinary: + Dysuria, rectal pain. Negative for hematuria.  Musculoskeletal: Negative for gait changes, joint pain or muscle pain.   Skin:  Negative for itching or rash.   Hematological: Negative for easy bruising      Physical Exam   BP 100/63   Pulse 86   Temp 97.4 F (36.3 C) (Axillary)   Resp 16   Ht 5\' 1"  (1.549 m)   Wt 46.5 kg   SpO2 99%   BMI 19.37 kg/m     Physical Exam   Constitutional: Oriented to person, place, and time  and in no distress.   Head: Normocephalic and atraumatic.   Mouth/Throat: Oropharynx is clear and moist. Dry MM.  Eyes: Conjunctivae normal and EOM are normal. Pupils are equal, round, and reactive to light.    Neck: Normal range of motion. Neck supple.   Cardiovascular: Normal rate, regular rhythm, normal heart sounds and intact distal pulses.  No murmur heard.  Pulmonary/Chest: Effort normal and breath sounds normal.   Abdominal: Soft. Non distended. There is diffuse abdominal tenderness, worse in the periumbilical area. No rebound or guarding  Genitourinary: Intermittent rectal pain.  Musculoskeletal: No peripheral edema. No calf swelling or tenderness.    Neurological: Patient is alert and oriented to person, place, and time. No cranial nerve deficit.  GCS score is 15.   Skin: Skin is warm and dry. No rash  Psychiatric: Affect normal.       Diagnostic Study Results     Labs -     Results     Procedure Component Value Units Date/Time    Comprehensive Metabolic Panel (CMP) [[id number]]  (Abnormal) Collected:  01/09/17 1932    Specimen:  Blood Updated:  01/09/17 2009     Glucose 90 mg/dL      BUN 40.9 mg/dL      Creatinine 0.8 mg/dL      Sodium 811 mEq/L      Potassium 4.2 mEq/L      Chloride 106 mEq/L      CO2 24 mEq/L      Calcium 8.9 mg/dL      Protein, Total 7.4 g/dL      Albumin 3.7 g/dL      AST (SGOT) 19 U/L      ALT 10 U/L      Alkaline Phosphatase 61 U/L      Bilirubin, Total 0.2 mg/dL  Globulin 3.7 (H) g/dL      Albumin/Globulin Ratio 1.0     Anion Gap 6.0    Lipase [[id number]]  (Abnormal) Collected:  01/09/17 1932    Specimen:  Blood Updated:  01/09/17 2009     Lipase <4 (L) U/L     Hemolysis index  [[id number]] Collected:  01/09/17 1932     Updated:  01/09/17 2009     Hemolysis Index 5    GFR [[id number]] Collected:  01/09/17 1932     Updated:  01/09/17 2009     EGFR >60.0    UA, Reflex to Microscopic (pts 3 + yrs) [[id number]]  (Abnormal) Collected:  01/09/17 1921    Specimen:  Urine Updated:  01/09/17 2007     Urine Type Clean Catch     Color, UA Yellow     Clarity, UA Hazy     Specific Gravity UA 1.023     Urine pH 6.0     Leukocyte Esterase, UA Trace (A)     Nitrite, UA Negative     Protein, UR Negative     Glucose, UA Negative     Ketones UA 20 (A)     Urobilinogen, UA 2.0 mg/dL      Bilirubin, UA Negative     Blood, UA Negative     RBC, UA 0 - 2 /hpf      WBC, UA 0 - 5 /hpf      Squamous Epithelial Cells, Urine 0 - 5 /hpf     CBC with differential [[id number]]  (Abnormal) Collected:  01/09/17 1932    Specimen:  Blood from Blood Updated:  01/09/17 1953     WBC 6.29 x10 3/uL      Hgb 11.3 (L) g/dL      Hematocrit 24.4 %      Platelets 404 (H) x10 3/uL      RBC 4.30 x10 6/uL      MCV 86.5 fL      MCH 26.3 (L) pg      MCHC 30.4 (L) g/dL      RDW 17 (H) %      MPV 9.3 (L) fL      Neutrophils 60.0 %      Lymphocytes Automated 30.0 %      Monocytes 8.9 %      Eosinophils Automated 0.3 %      Basophils Automated 0.5 %      Immature Granulocyte 0.3 %      Nucleated RBC 0.0 /100 WBC      Neutrophils Absolute 3.77 x10 3/uL      Abs Lymph Automated 1.89 x10 3/uL      Abs Mono Automated 0.56 x10 3/uL      Abs Eos Automated 0.02 x10 3/uL      Absolute Baso Automated 0.03 x10 3/uL      Absolute Immature Granulocyte 0.02 x10 3/uL      Absolute NRBC 0.00 x10 3/uL           Radiologic Studies -   Radiology Results (24 Hour)     ** No results found for the last 24 hours. **      .    Medical Decision Making   I am the first provider for this patient.    I reviewed the vital signs, available nursing notes, past medical history, past surgical history, family history and social history.    Vital Signs-Reviewed the patient's vital  signs.  Patient Vitals for the past 12 hrs:   BP Temp Pulse Resp   01/10/17 0031 100/63 97.4 F (36.3 C) 86 16   01/09/17 2356 96/62 98.6 F (37 C) 87 16   01/09/17 2202 96/53 - 79 16   01/09/17 2154 100/58 - 88 16   01/09/17 1655 124/79 98.7 F (37.1 C) (!) 117 16       Pulse Oximetry Analysis - Normal 99% on room air    Cardiac Monitor:  Rate: 87  Rhythm:  Normal Sinus Rhythm     Old Medical Records:   Nursing notes.     ED Course:   8:14 PM - Discussed ED plan for labs, UA and pain control. Pt accepts pain medication.     10:20 PM - Upon reassessment, pt is still in pain.     10:35 PM - D/w Wynona Canes MLP, GI, who states Dr. Cam Hai will consult pt tomorrow.     10:53 PM - D/w Dr. Caryl Comes, sound, who accepts for observation, general medicine.    Provider Notes: PMH sig for chronic abdominal pain and crohns disease poorly controlled in ED with morphine. No sig lab abnormalities. H/H stable. Will observe and treat pain. GI consult placed.     For Hospitalized Patients:    1. Hospitalization Decision Time:  The decision to admit this patient was made by the emergency provider at 10:53 pm on 01/09/2017    2. Aspirin: Aspirin was not given because the patient did not present with a stroke at the time of their Emergency Department evaluation.    3. Core Measures: N/a    Diagnosis     Clinical Impression:   1. Exacerbation of Crohn's disease with complication        Treatment Plan:   ED Disposition     ED Disposition Condition Date/Time Comment    Observation  Fri Jan 09, 2017 11:04 PM Admitting Physician: Nelson Chimes [[id number]]   Diagnosis: Exacerbation of Crohn's disease [098119]   Estimated Length of Stay: < 2 midnights   Tentative Discharge Plan?: Home or Self Care [1]   Patient Class: Observation [104]              _______________________________      Attestations: This note is prepared by Marland Kitchen, acting as scribe for Audley Hose, MD.    Audley Hose, MD - The scribe's documentation has been  prepared under my direction and personally reviewed by me in its entirety.  I confirm that the note above accurately reflects all work, treatment, procedures, and medical decision making performed by me.    _______________________________     Cherlyn Roberts, MD  01/11/17 [phone]

## 2017-01-10 LAB — CBC AND DIFFERENTIAL
Absolute NRBC: 0 10*3/uL
Basophils Absolute Automated: 0.01 10*3/uL (ref 0.00–0.20)
Basophils Automated: 0.2 %
Eosinophils Absolute Automated: 0.07 10*3/uL (ref 0.00–0.70)
Eosinophils Automated: 1.2 %
Hematocrit: 31.7 % — ABNORMAL LOW (ref 37.0–47.0)
Hgb: 9.7 g/dL — ABNORMAL LOW (ref 12.0–16.0)
Immature Granulocytes Absolute: 0.02 10*3/uL
Immature Granulocytes: 0.4 %
Lymphocytes Absolute Automated: 2.49 10*3/uL (ref 0.50–4.40)
Lymphocytes Automated: 44 %
MCH: 26.5 pg — ABNORMAL LOW (ref 28.0–32.0)
MCHC: 30.6 g/dL — ABNORMAL LOW (ref 32.0–36.0)
MCV: 86.6 fL (ref 80.0–100.0)
MPV: 9.4 fL (ref 9.4–12.3)
Monocytes Absolute Automated: 0.81 10*3/uL (ref 0.00–1.20)
Monocytes: 14.3 %
Neutrophils Absolute: 2.26 10*3/uL (ref 1.80–8.10)
Neutrophils: 39.9 %
Nucleated RBC: 0 /100 WBC (ref 0.0–1.0)
Platelets: 333 10*3/uL (ref 140–400)
RBC: 3.66 10*6/uL — ABNORMAL LOW (ref 4.20–5.40)
RDW: 17 % — ABNORMAL HIGH (ref 12–15)
WBC: 5.66 10*3/uL (ref 3.50–10.80)

## 2017-01-10 LAB — C-REACTIVE PROTEIN: C-Reactive Protein: <0.1 mg/dL (ref 0.0–0.8)

## 2017-01-10 LAB — SEDIMENTATION RATE: Sed Rate: 12 mm/Hr (ref 0–20)

## 2017-01-10 MED ORDER — FAMOTIDINE 10 MG/ML IV SOLN (WRAP)
20.0000 mg | Freq: Two times a day (BID) | INTRAVENOUS | Status: DC
Start: 2017-01-10 — End: 2017-01-11
  Administered 2017-01-10: 20 mg via INTRAVENOUS
  Filled 2017-01-10: qty 2

## 2017-01-10 MED ORDER — ONDANSETRON 4 MG PO TBDP
4.0000 mg | ORAL_TABLET | Freq: Four times a day (QID) | ORAL | Status: DC | PRN
Start: 2017-01-10 — End: 2017-01-11

## 2017-01-10 MED ORDER — ACETAMINOPHEN 325 MG PO TABS
650.0000 mg | ORAL_TABLET | Freq: Four times a day (QID) | ORAL | Status: DC | PRN
Start: 2017-01-10 — End: 2017-01-11

## 2017-01-10 MED ORDER — SODIUM CHLORIDE 0.9 % IV SOLN
INTRAVENOUS | Status: DC
Start: 2017-01-10 — End: 2017-01-11
  Administered 2017-01-11: 100 mL/h via INTRAVENOUS

## 2017-01-10 MED ORDER — ENOXAPARIN SODIUM 40 MG/0.4ML SC SOLN
40.0000 mg | Freq: Every day | SUBCUTANEOUS | Status: DC
Start: 2017-01-10 — End: 2017-01-11
  Administered 2017-01-10: 40 mg via SUBCUTANEOUS
  Filled 2017-01-10 (×2): qty 0.4

## 2017-01-10 MED ORDER — NALOXONE HCL 0.4 MG/ML IJ SOLN (WRAP)
0.2000 mg | INTRAMUSCULAR | Status: DC | PRN
Start: 2017-01-10 — End: 2017-01-11

## 2017-01-10 MED ORDER — ZOLPIDEM TARTRATE 5 MG PO TABS
5.0000 mg | ORAL_TABLET | Freq: Every evening | ORAL | Status: DC | PRN
Start: 2017-01-10 — End: 2017-01-11
  Administered 2017-01-10 (×2): 5 mg via ORAL
  Filled 2017-01-10 (×2): qty 1

## 2017-01-10 MED ORDER — HYDROMORPHONE HCL 1 MG/ML IJ SOLN
1.0000 mg | INTRAMUSCULAR | Status: DC | PRN
Start: 2017-01-10 — End: 2017-01-11
  Administered 2017-01-10 – 2017-01-11 (×7): 1 mg via INTRAVENOUS
  Filled 2017-01-10 (×7): qty 1

## 2017-01-10 MED ORDER — LEVOTHYROXINE SODIUM 88 MCG PO TABS
88.0000 ug | ORAL_TABLET | Freq: Every day | ORAL | Status: DC
Start: 2017-01-10 — End: 2017-01-11
  Administered 2017-01-10 – 2017-01-11 (×2): 88 ug via ORAL
  Filled 2017-01-10 (×2): qty 1

## 2017-01-10 MED ORDER — FAMOTIDINE 20 MG PO TABS
20.0000 mg | ORAL_TABLET | Freq: Two times a day (BID) | ORAL | Status: DC
Start: 2017-01-10 — End: 2017-01-11
  Administered 2017-01-10 – 2017-01-11 (×3): 20 mg via ORAL
  Filled 2017-01-10 (×3): qty 1

## 2017-01-10 MED ORDER — HYDROMORPHONE HCL 1 MG/ML IJ SOLN
1.0000 mg | INTRAMUSCULAR | Status: DC | PRN
Start: 2017-01-10 — End: 2017-01-10
  Administered 2017-01-10 (×3): 1 mg via INTRAVENOUS
  Filled 2017-01-10 (×3): qty 1

## 2017-01-10 MED ORDER — HYDROMORPHONE HCL 1 MG/ML IJ SOLN
0.4000 mg | INTRAMUSCULAR | Status: DC | PRN
Start: 2017-01-10 — End: 2017-01-10
  Administered 2017-01-10 (×2): 0.4 mg via INTRAVENOUS
  Filled 2017-01-10 (×2): qty 1

## 2017-01-10 MED ORDER — ONDANSETRON HCL 4 MG/2ML IJ SOLN
4.0000 mg | Freq: Four times a day (QID) | INTRAMUSCULAR | Status: DC | PRN
Start: 2017-01-10 — End: 2017-01-11
  Administered 2017-01-10 (×2): 4 mg via INTRAVENOUS
  Filled 2017-01-10 (×2): qty 2

## 2017-01-10 NOTE — Consults (Signed)
CONSULTATION NOTE    [address], [place], [place] [address]  [id number]  [name]    Date of admission: 01/09/2017  Date of consult: 01/10/17      Assessment:  Intractable Abd pain related to CD, UTI, vs. 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 female w/ crohn's dz on humira QOW admitted for management of intractable abd pain.  Labs/imaging not suggestive of crohn's flare.  Would avoid steroids.  ___________________________  Plan:  Supportive measures  Minimize narcotic use  Pain management team recs would be appreciated  Continue humira QOW and f/u with primary GI regarding this  No steroids necessary at this time    Clydene Pugh, PA   12:45 PM    Thank you for allowing [place] to see and participate in this patient's care.  This case will be discussed with Dr. Cam Hai who will see this patient as well today and will write an accompanying GI consultation and treatment plan.  ________________________    Referring Physician: Dr. Roseanne Reno    Consulting Physician: Dr. Cam Hai     Reason for consultation: abd pain w/ crohn's    Chief complaint: abd pain    HPI:  [name] is a 31 y.o. female w/ PMHx of Crohn's Dz dx around age 88, hashimoto's thyroiditis, remote hx of erythema nodosum, PCOS, pyelonephritis, cushing's dz.  Regarding her Crohn's she has been treated w/ cimzia, remicade and is currently on humira 40 mg every other week.  She has been in and out of the hospital for admissions and ER visits mostly for management of abd pain.  (Admitted to [facility] 12/29-12/31/17, 12/24-12/27/17, 10/30-11/1/17, 10/25-10/27/17 [facility].)  Her most recent colonoscopy was at [facility] prior to moving back to [place] (around the summer) which showed "crohn's" records unavailable.  She has been treated w/ abx and budesonide for her symptoms for possible crohn's flares (although ESR/CRP normal, CT  normal).  She is admitted to [facility] for management of intractable abd pain. She reports some diarrhea pre-admit and dysuira.  She ran out of pain meds (oxycontin) and has been advised to see a pain specialist.  She has not been able to work in the last 4 weeks, is an Charity fundraiser at [employer].  There has been suggestion of changing her humira to weekly dosing w/ her primary GI Dr. Maximino Sarin.    Past Medical History:   Diagnosis Date   . Abortion    . Anxiety    . Crohn's disease    . H/O Hashimoto thyroiditis     hypothyroidism   . PCOS (polycystic ovarian syndrome)    . Tachycardia        Past Surgical History:   Procedure Laterality Date   . BREAST IMPLANT     . COLONOSCOPY     . D & C, SUCTION N/A 08/24/2014    Procedure: D & C, SUCTION;  Surgeon: Maryruth Hancock, MD;  Location: [facility];  Service: Obstetrics;  Laterality: N/A;   . INDUCED ABORTION         Allergies   Allergen Reactions   . Bentyl [Dicyclomine] Hives   . Morphine    . Bactrim [Sulfamethoxazole W/Trimethoprim (Co-Trimoxazole)] Rash   . Sulfa Antibiotics Rash       Social History     Social History   . Marital  status: Single     Spouse name: N/A   . Number of children: N/A   . Years of education: N/A     Occupational History   . Not on file.     Social History Main Topics   . Smoking status: Never Smoker   . Smokeless tobacco: Never Used   . Alcohol use Yes      Comment: rare   . Drug use: No   . Sexual activity: Not on file     Other Topics Concern   . Not on file     Social History Narrative   . No narrative on file       History reviewed. No pertinent family history.    Current Discharge Medication List      CONTINUE these medications which have NOT CHANGED    Details   Acetaminophen-Codeine (TYLENOL/CODEINE #3) 300-30 MG per tablet Take 1-2 tablets by mouth every 6 (six) hours as needed for Pain.for up to 12 doses  Qty: 12 tablet, Refills: 0      adalimumab (HUMIRA PEN) 40 MG/0.8ML Pen-injector Kit Inject 40 mg into the skin every 14 (fourteen)  days.      ALPRAZolam (XANAX) 0.5 MG tablet Take 0.5 mg by mouth nightly as needed.      levothyroxine (SYNTHROID, LEVOTHROID) 88 MCG tablet Take 88 mcg by mouth daily.      Zolpidem Tartrate (AMBIEN PO) Take 10 mg by mouth nightly as needed.         Ibuprofen (MOTRIN PO) Take by mouth as needed.         Multiple Vitamins-Minerals (HAIR/SKIN/NAILS) Tab Take 1 Tab by Mouth Once a Day. Indications: Vitamin Deficiency      ondansetron (ZOFRAN) 4 MG tablet Take 1 tablet (4 mg total) by mouth every 8 (eight) hours as needed for Nausea.  Qty: 12 tablet, Refills: 0             Current Facility-Administered Medications   Medication Dose Route Frequency Last Rate Last Dose   . 0.9%  NaCl infusion   Intravenous Continuous       . acetaminophen (TYLENOL) tablet 650 mg  650 mg Oral Q6H PRN       . enoxaparin (LOVENOX) syringe 40 mg  40 mg Subcutaneous Daily   40 mg at 01/10/17 0959   . famotidine (PEPCID) tablet 20 mg  20 mg Oral Q12H [facility]   20 mg at 01/10/17 0959    Or   . famotidine (PEPCID) injection 20 mg  20 mg Intravenous Q12H [facility]   20 mg at 01/10/17 0030   . HYDROmorphone (DILAUDID) injection 1 mg  1 mg Intravenous Q4H PRN   1 mg at 01/10/17 0959   . levothyroxine (SYNTHROID, LEVOTHROID) tablet 88 mcg  88 mcg Oral Daily at 0600   88 mcg at 01/10/17 0559   . naloxone [facility]) injection 0.2 mg  0.2 mg Intravenous PRN       . ondansetron (ZOFRAN-ODT) disintegrating tablet 4 mg  4 mg Oral Q6H PRN        Or   . ondansetron (ZOFRAN) injection 4 mg  4 mg Intravenous Q6H PRN   4 mg at 01/10/17 0510   . zolpidem (AMBIEN) tablet 5 mg  5 mg Oral QHS PRN   5 mg at 01/10/17 0037       Review of Systems  Constitutional  Negative for fevers or weight loss   Skin  Negative for rash  HENT  Negative for sore throat   Eyes  Negative for blurred vision   Cardiovascular  Negative for chest pain   Respiratory  Negative for SOB or cough   Gastrointestinal  see HPI   Genitourinary  dysuria   Musculoskeletal  Negative for joint pain   Endo   Negative for diabetes   Heme  Negative for anemia   Neurological  Negative for syncope   Psych  Negative for depression       Physical Exam  BP 91/52   Pulse 99   Temp 98.2 F (36.8 C) (Oral)   Resp 16   Ht 1.549 m (5\' 1" )   Wt 46.5 kg (102 lb 8 oz)   SpO2 99%   BMI 19.37 kg/m     General Appearance:    no acute distress, appears stated age and looks comfortable. thin   HEENT:    Normocephalic, without obvious abnormality, atraumatic, sclera anicteric, oral mucosa pink/moist   Lungs:     Clear to auscultation bilaterally, no wheezing/rhonchi/rales    Heart:    Regular rate and rhythm, S1 and S2 normal, no murmur, rub or gallops appreciated   Abdomen:    soft and tender to palpation diffuse no guarding.   Rectal:   deferred    Extremities:   Extremities normal, atraumatic, no cyanosis or edema   Skin:   Skin color, texture, turgor normal, no rashes or lesions and no jaundice   Neurologic:   AAOx3, no focal deficits           Psychological: normal affect    Laboratory Data reviewed:      Recent Labs  Lab 01/10/17  0407 01/09/17  1932   WBC 5.66 6.29   Hgb 9.7* 11.3*   Hematocrit 31.7* 37.2   Platelets 333 404*   MCV 86.6 86.5   Neutrophils 39.9 60.0       Recent Labs  Lab 01/09/17  1932   Sodium 136   Potassium 4.2   Chloride 106   CO2 24   BUN 12.0   Creatinine 0.8   Glucose 90   Calcium 8.9   Protein, Total 7.4   Albumin 3.7   AST (SGOT) 19   ALT 10   Alkaline Phosphatase 61   Bilirubin, Total 0.2     Glucose:    Recent Labs  Lab 01/09/17  1932   Glucose 90      Ref. Range 01/10/2017 04:07   C-Reactive Protein Latest Ref Range: 0.0 - 0.8 mg/dL <0.9   Sed Rate Latest Ref Range: 0 - 20 mm/Hr 12      Ref. Range 01/09/2017 19:21   Urine Type Unknown Clean Catch   Color, UA Latest Ref Range: Clear - Yellow  Yellow   Clarity, UA Latest Ref Range: Clear - Hazy  Hazy   Specific Gravity, UA Latest Ref Range: 1.001 - 1.035  1.023   Urine pH Latest Ref Range: 5.0 - 8.0  6.0   Leukocyte Esterase, UA Latest Ref Range:  Negative  Trace (A)   Nitrite, UA Latest Ref Range: Negative  Negative   Protein, UR Latest Ref Range: Negative  Negative   Glucose, UA Latest Ref Range: Negative  Negative   Ketones UA Latest Ref Range: Negative  20 (A)   Urobilinogen, UA Latest Ref Range: 0.2 - 2.0 mg/dL 2.0   Bilirubin, UA Latest Ref Range: Negative  Negative   Blood, UA Latest Ref Range: Negative  Negative  RBC UA Latest Ref Range: 0 - 5 /hpf 0 - 2   WBC, UA Latest Ref Range: 0 - 5 /hpf 0 - 5   Squamous Epithelial Cells, Urine Latest Ref Range: 0 - 25 /hpf 0 - 5     Radiological Imaging reviewed:    CT abd/pelvis 10/23/16:  IMPRESSION:   Normal CT abdomen and pelvis. No evidence of Crohn's flare.

## 2017-01-10 NOTE — UM Notes (Signed)
Primary Payor: OUT OF STATE BLUE CROSS/BCBS OUT OF STATE   Place (admit) for Observation Services (Order [id number]) 01/09/17 2304      31 y.o. female with a medical history of Crohn's disease presenting to the ED with diffuse abdominal pain since earlier today. Pt notes her pain feeling similar to a typical Crohn's disease flare up. Pt reports dysuria and rectal pain along with her abdominal pain. Pt notes recent hospitalization 2 weeks ago for similar issues, had CT done that showed no abscess. Pt sees Dr. Malena Edman) and takes Humira for her Crohn's. Pt notes being hospitalized in the past for her Crohn's flares     98.7 F (37.1 C) --  117 100 % -- 16 124/79     Leukocyte Esterase, UA Trace (A)       Nitrite, UA Negative     Protein, UR Negative     Glucose, UA Negative     Ketones UA 20 (A)     CBC with differential [[id number]]  (Abnormal) Collected:  01/09/17 1932     Specimen:  Blood from Blood Updated:  01/09/17 1953     WBC 6.29 x10 3/uL      Hgb 11.3 (L) g/dL      Hematocrit 78.2 %      Platelets 404 (H) x10 3/uL      RBC 4.30 x10 6/uL      MCV 86.5 fL      MCH 26.3 (L) pg      MCHC 30.4 (L) g/dL      RDW 17 (H) %      MPV 9.3 (L) fL      Neutrophils 60.0 %        ER MEDS:  01/09/2017 1755 ondansetron (ZOFRAN) injection 4 mg 4 mg Intravenous Given Kemba, Hoppes, RN    01/09/2017 1821 sodium chloride 0.9 % bolus 1,000 mL 0 mL Intravenous Stopped Leretha Dykes, RN    01/09/2017 1721 sodium chloride 0.9 % bolus 1,000 mL 1,000 mL Intravenous [address] [place], [place], [place]    01/09/2017 1836 fentaNYL (PF) (SUBLIMAZE) injection 50 mcg 50 mcg Intravenous Given Amariz, Flamenco, RN    01/09/2017 1836 promethazine (PHENERGAN) injection 12.5 mg 12.5 mg Intravenous Given Cydni, Reddoch, RN    01/09/2017 2156 sodium chloride 0.9 % bolus 1,000 mL 0 mL Intravenous Stopped Gianan, Irena Reichmann, RN completed.   01/09/2017 2041 sodium chloride 0.9 % bolus 1,000 mL  1,000 mL Intravenous [address] [place], [place]    01/09/2017 2041 fentaNYL (PF) (SUBLIMAZE) injection 50 mcg 50 mcg Intravenous Given Rande Brunt, [place]    01/09/2017 2044 metoclopramide (REGLAN) injection 10 mg 10 mg Intravenous Given Rande Brunt, [place]    01/09/2017 2042 LORazepam (ATIVAN) injection 1 mg 1 mg Intravenous Given Rande Brunt, [place]    01/09/2017 2156 fentaNYL (PF) (SUBLIMAZE) injection 50 mcg 50 mcg Intravenous Given Elpidio Anis, RN    01/09/2017 2357 ondansetron (ZOFRAN) injection 4 mg 4 mg Intravenous Given           ADMIT  FULL LIQ DIET    1/13 MD PROG NOTE-. Crohn's flare up :   - Patient with no fever, diarrhea, or leukocytosis   - Pending GI recs    - Avoid steroids for now   - No need for Abx   - Control pain   - Advance diet as tolerated   - ESR Normal       2.  Hypothyroidism  - continue synthroid        Analgesia: Dilaudid     Nutrition: Full liquid     DVT Prophylaxis: Lovenox     Pt still C/O abdominal pain but no diarrhea     Current Facility-Administered Medications   Medication Dose Route Frequency   . enoxaparin  40 mg Subcutaneous Daily   . famotidine  20 mg Oral Q12H [facility]    Or   . famotidine  20 mg Intravenous Q12H [facility]   . levothyroxine  88 mcg Oral Daily at 0600     0.9% NaCl infusion  Rate: 100 mL/hr Freq: Continuous Route: IV  HYDROmorphone (DILAUDID) injection 0.4 mg  Dose: 0.4 mg Freq: Every 4 hours PRN Route: IV  PRN Reasons: moderate pain,severe pain-X2 TODAY  Start: 01/10/17 0014 End: 01/10/17 0513  INCREASED TO  HYDROmorphone (DILAUDID) injection 1 mg  Dose: 1 mg Freq: Every 4 hours PRN Route: IV  PRN Reasons: moderate pain,severe pain-X3 TODAY  Start: 01/10/17 0512    Homero Fellers, RN, BSN, ACM, CCM  Utilization Review Nurse  [facility]  [phone] [facility] Staff ONLY)  [phone] (Carriers)    Please submit all clinical review requests via fax to [phone]

## 2017-01-10 NOTE — Progress Notes (Signed)
[facility]  PROGRESS NOTE      Patient: [name]  Date: 01/10/2017   LOS: 0 Days  Admission Date: 01/09/2017   MRN: [id number]  Attending: Cathe Mons  Please contact me on the following   Pager [phone]        ASSESSMENT/PLAN     Zuzu Befort is a 31 y.o. female admitted with Crohn's flare up     Interval Summary:     1. Crohn's flare up :   - Patient with no fever, diarrhea, or leukocytosis   - Pending GI recs    - Avoid steroids for now   - No need for Abx   - Control pain   - Advance diet as tolerated   - ESR Normal       2. Hypothyroidism  - continue synthroid        Analgesia: Dilaudid     Nutrition: Full liquid     DVT Prophylaxis: Lovenox        Code Status: Full code     DISPO:     Family Contact:        SUBJECTIVE     [name] is still C/O abdominal pain but no diarrhea     MEDICATIONS     Current Facility-Administered Medications   Medication Dose Route Frequency   . enoxaparin  40 mg Subcutaneous Daily   . famotidine  20 mg Oral Q12H SCH    Or   . famotidine  20 mg Intravenous Q12H SCH   . levothyroxine  88 mcg Oral Daily at 0600       PHYSICAL EXAM     Vitals:    01/10/17 0646   BP: 91/52   Pulse: 99   Resp: 16   Temp: 98.2 F (36.8 C)   SpO2: 99%       Temperature: Temp  Min: 97.4 F (36.3 C)  Max: 98.7 F (37.1 C)  Pulse: Pulse  Min: 79  Max: 117  Respiratory: Resp  Min: 16  Max: 16  Non-Invasive BP: BP  Min: 91/52  Max: 124/79  Pulse Oximetry SpO2  Min: 97 %  Max: 100 %    Intake and Output Summary (Last 24 hours) at Date Time    Intake/Output Summary (Last 24 hours) at 01/10/17 1110  Last data filed at 01/10/17 0129   Gross per 24 hour   Intake             1500 ml   Output                0 ml   Net             1500 ml         GEN APPEARANCE: Normal;  A&OX3  HEENT: PERLA; EOMI; Conjunctiva Clear  NECK: Supple; No bruits  CVS: RRR, S1, S2; No M/G/R  LUNGS: CTAB; No Wheezes; No Rhonchi: No rales  ABD: Soft; No TTP; + Normoactive BS  EXT: No edema; Pulses 2+ and  intact  Skin exam:  no pallor  NEURO: CN 2-12 intact; No Focal neurological deficits  CAP REFILL:  Normal  MENTAL STATUS:  Normal    Exam done by Cathe Mons, MD on 01/10/17 at 11:10 AM      LABS       Recent Labs  Lab 01/10/17  0407 01/09/17  1932   WBC 5.66 6.29   RBC 3.66* 4.30  Hgb 9.7* 11.3*   Hematocrit 31.7* 37.2   MCV 86.6 86.5   Platelets 333 404*         Recent Labs  Lab 01/09/17  1932   Sodium 136   Potassium 4.2   Chloride 106   CO2 24   BUN 12.0   Creatinine 0.8   Glucose 90   Calcium 8.9         Recent Labs  Lab 01/09/17  1932   ALT 10   AST (SGOT) 19   Bilirubin, Total 0.2   Albumin 3.7   Alkaline Phosphatase 61                   Microbiology Results     None           RADIOLOGY     Upon my review:    Brendolyn Patty  11:10 AM 01/10/2017

## 2017-01-10 NOTE — Plan of Care (Signed)
Problem: Altered GI Function  Goal: Elimination patterns are normal or improving  Outcome: Progressing   01/10/17 0058   Goal/Interventions addressed this shift   Elimination patterns are normal or improving Anticipate/assist with toileting needs;Assess for normal bowel sounds;Monitor for abdominal distension;Monitor for abdominal discomfort;Assess for signs and symptoms of bleeding. Report signs of bleeding to physician;Administer treatments as ordered;Administer medications to improve bowel evacuation as prescribed;Reinforce education on foods that improve and complicate bowel movements and how activity and medications can affect bowel movements       Problem: Safety  Goal: Patient will be free from injury during hospitalization  Outcome: Progressing   01/10/17 0058   Goal/Interventions addressed this shift   Patient will be free from injury during hospitalization  Assess patient's risk for falls and implement fall prevention plan of care per policy;Provide and maintain safe environment;Use appropriate transfer methods;Ensure appropriate safety devices are available at the bedside       Comments: Patient is alert and oriented x4, VSS, pain medication given for abdominal pain. Bowel sounds active. No nausea or diarrhea during the night shift. Low fall precaution in place.

## 2017-01-10 NOTE — Plan of Care (Signed)
Problem: Pain  Goal: Pain at adequate level as identified by patient  Outcome: Progressing  Continue to report abdominal pain, needing dilaudid q4hr.   01/10/17 1240   Goal/Interventions addressed this shift   Pain at adequate level as identified by patient Identify patient comfort function goal;Assess for risk of opioid induced respiratory depression, including snoring/sleep apnea. Alert healthcare team of risk factors identified.;Assess pain on admission, during daily assessment and/or before any "as needed" intervention(s);Reassess pain within 30-60 minutes of any procedure/intervention, per Pain Assessment, Intervention, Reassessment (AIR) Cycle;Evaluate if patient comfort function goal is met;Include patient/patient care companion in decisions related to pain management as needed

## 2017-01-10 NOTE — Progress Notes (Signed)
Patient admitted with abdominal pain, AOX4, VSS. pain states 7/10 in pain scale, given dilaudid. Oriented room. Low fall precaution bundle in place.

## 2017-01-11 MED ORDER — OXYCODONE HCL 5 MG PO TABS
15.0000 mg | ORAL_TABLET | ORAL | 0 refills | Status: DC | PRN
Start: 2017-01-11 — End: 2017-01-11

## 2017-01-11 MED ORDER — OXYCODONE HCL 15 MG PO TABS
15.0000 mg | ORAL_TABLET | ORAL | 0 refills | Status: DC | PRN
Start: 2017-01-11 — End: 2017-05-01

## 2017-01-11 NOTE — Discharge Instr - Diet (Signed)
Regular as tolerated

## 2017-01-11 NOTE — Discharge Summary (Signed)
[facility]      Patient: [name]  Admission Date: 01/09/2017   DOB: 1986/09/04  Discharge Date: 01/11/2017    MRN: [id number]  Discharge Attending:Tyreisha Ungar Judie Petit Celestia Khat     Referring Physician: Vivia Ewing, NP  PCP: Vivia Ewing, NP       DISCHARGE SUMMARY     Discharge Information   Admission Diagnosis:   Crohn's Flare up     Discharge Diagnosis:   Crohn'z Disease   Active Hospital Problems    Diagnosis   . Exacerbation of Crohn's disease        Admission Condition: Unstable   Discharge Condition: Stable   Consultants: GI   Functional Status: Independent   Discharged to: Home     Discharge Medications:     Medication List      START taking these medications    oxyCODONE 15 MG immediate release tablet  Commonly known as:  ROXICODONE  Take 1 tablet (15 mg total) by mouth every 4 (four) hours as needed for Pain.        CONTINUE taking these medications    ALPRAZolam 0.5 MG tablet  Commonly known as:  XANAX     AMBIEN PO     HAIR/SKIN/NAILS Tabs     HUMIRA PEN 40 MG/0.8ML Pnkt  Generic drug:  adalimumab     levothyroxine 88 MCG tablet  Commonly known as:  SYNTHROID, LEVOTHROID     MOTRIN PO     ondansetron 4 MG tablet  Commonly known as:  ZOFRAN  Take 1 tablet (4 mg total) by mouth every 8 (eight) hours as needed for Nausea.        STOP taking these medications    Acetaminophen-Codeine 300-30 MG per tablet  Commonly known as:  TYLENOL/CODEINE #3           Where to Get Your Medications      You can get these medications from any pharmacy    Bring a paper prescription for each of these medications   oxyCODONE 15 MG immediate release tablet             Hospital Course   Presentation History   [name] is a 31 y.o. female with a PMHx of hypothyroidism and Crohn's disease who presented with Right lower quadrant abdominal pain. The pain started today in the morning, localized to the RLQ, sharp and constant with intensity of 7-8/10 and non radiating. She takes [place] Q 2 weeks for  Crohn's disease and had CT abdomen and pelvis about a week ago and no further treatment was suggested by her GI. No fever, chills, diarrhea or vomiting. C/o occasional nausea. In the ED she was given fentanyl but the pain persisted. The case was discussed With GI service by ED and agreed to observe the patient in the Hospital    See HPI for details.    Hospital Course (0 Days)   [name] with PMHx of Crohn's disease who was admitted with abdominal pain ?? Crohn's f;are up ( ESR was normal ) . GI evaluated the patient and recommended avoiding steroids and minimize narcotics use .   Patient was instructed to F/U with GI and PCP     Procedures/Imaging:   Upon my review:        Best Practices   Was the patient admitted with either a CHF Exacerbation or Pneumonia? No      Progress Note/Physical Exam at Discharge     Subjective:  Still C/O abdominal pain     Vitals:    01/10/17 2217 01/11/17 0507 01/11/17 0655 01/11/17 0656   BP: 109/54  97/43 105/60   Pulse: 81  91 85   Resp: 16  17    Temp: 98.6 F (37 C)  97.4 F (36.3 C)    TempSrc: Oral  Oral    SpO2: 99%  100% 100%   Weight:  49.4 kg (108 lb 14.4 oz)     Height:               General: NAD, AAOx3  HEENT: perrla, eomi, sclera anicteric, OP: Clear, MMM  Neck: supple, FROM, no LAD  Cardiovascular: RRR, no m/r/g  Lungs: CTAB, no w/r/r  Abdomen: soft, +BS, NT/ND, no masses, no g/r  Extremities: no C/C/E  Skin: no rashes or lesions noted  Neuro: CN 2-12 intact; No Focal neurological deficits       Diagnostics     Labs/Studies Pending at Discharge: No    Last Labs     Recent Labs  Lab 01/10/17  0407 01/09/17  1932   WBC 5.66 6.29   RBC 3.66* 4.30   Hgb 9.7* 11.3*   Hematocrit 31.7* 37.2   MCV 86.6 86.5   Platelets 333 404*         Recent Labs  Lab 01/09/17  1932   Sodium 136   Potassium 4.2   Chloride 106   CO2 24   BUN 12.0   Creatinine 0.8   Glucose 90   Calcium 8.9       Microbiology Results     None           Patient Instructions   Discharge Diet: regular    Discharge Activity: as tolerated     Follow Up Appointment:  Follow-up Information     Schutter, Elenore Paddy, NP Follow up.    Specialty:  Nurse Practitioner  Contact information:  [address]  [place] [address]  [phone]             GI Follow up in 1 week(s).                  Time spent examining patient, discussing with patient/family regarding hospital course, chart review, reconciling medications and discharge planning: 25  minutes.    Brendolyn Patty    10:02 AM 01/11/2017

## 2017-01-11 NOTE — Discharge Instr - AVS First Page (Signed)
Reason for your Hospital Admission:  Crohn's Dz flare up       Instructions for after your discharge:  F/U with PCP and GI

## 2017-01-11 NOTE — Discharge Instr - Activity (Signed)
As tolerated

## 2017-01-11 NOTE — Plan of Care (Signed)
Problem: Altered GI Function  Goal: Fluid and electrolyte balance are achieved/maintained  Outcome: Progressing   01/11/17 0212   Goal/Interventions addressed this shift   Fluid and electrolyte balance are achieved/maintained Monitor for muscle weakness;Observe for seizure activity and initiate seizure precautions if indicated;Assess and reassess fluid and electrolyte status;Provide adequate hydration;Monitor intake and output every shift;Monitor/assess lab values and report abnormal values;Assess for confusion/personality changes;Monitor daily weight;Observe for cardiac arrhythmias   Patient is on IV fluids normal saline at 1ml/hour, fluid and electrolyte balances are in the normal range and improving, patient ate cupful of snack of her choice-ice cream to  Break the period of food abstainence    Problem: Pain  Goal: Pain at adequate level as identified by patient  Outcome: Progressing   01/11/17 0212   Goal/Interventions addressed this shift   Pain at adequate level as identified by patient Assess for risk of opioid induced respiratory depression, including snoring/sleep apnea. Alert healthcare team of risk factors identified.;Identify patient comfort function goal;Assess pain on admission, during daily assessment and/or before any "as needed" intervention(s);Reassess pain within 30-60 minutes of any procedure/intervention, per Pain Assessment, Intervention, Reassessment (AIR) Cycle;Evaluate if patient comfort function goal is met;Offer non-pharmacological pain management interventions;Evaluate patient's satisfaction with pain management progress;Consult/collaborate with Physical Therapy, Occupational Therapy, and/or Speech Therapy;Consult/collaborate with Pain Service;Include patient/patient care companion in decisions related to pain management as needed   Patient is on pain management to ensure her hospitalization and recovery be comfortable, she is on dilaudid 1mg  Q 3 hours, pain has been kept under control  by administering pain medication around the clock, patient stable

## 2017-05-01 ENCOUNTER — Emergency Department: Payer: Commercial Managed Care - PPO

## 2017-05-01 ENCOUNTER — Observation Stay: Payer: Commercial Managed Care - PPO | Admitting: Internal Medicine

## 2017-05-01 ENCOUNTER — Observation Stay
Admission: EM | Admit: 2017-05-01 | Discharge: 2017-05-05 | Disposition: A | Discharge status: Home / Self Care | Payer: Commercial Managed Care - PPO | Attending: Emergency Medicine | Admitting: Emergency Medicine

## 2017-05-01 DIAGNOSIS — E860 Dehydration: Secondary | ICD-10-CM | POA: Insufficient documentation

## 2017-05-01 DIAGNOSIS — Z87440 Personal history of urinary (tract) infections: Secondary | ICD-10-CM | POA: Insufficient documentation

## 2017-05-01 DIAGNOSIS — Z888 Allergy status to other drugs, medicaments and biological substances status: Secondary | ICD-10-CM | POA: Insufficient documentation

## 2017-05-01 DIAGNOSIS — R112 Nausea with vomiting, unspecified: Secondary | ICD-10-CM

## 2017-05-01 DIAGNOSIS — R1030 Lower abdominal pain, unspecified: Secondary | ICD-10-CM

## 2017-05-01 DIAGNOSIS — Z8719 Personal history of other diseases of the digestive system: Secondary | ICD-10-CM | POA: Insufficient documentation

## 2017-05-01 DIAGNOSIS — F418 Other specified anxiety disorders: Secondary | ICD-10-CM | POA: Insufficient documentation

## 2017-05-01 DIAGNOSIS — F419 Anxiety disorder, unspecified: Secondary | ICD-10-CM

## 2017-05-01 DIAGNOSIS — Z885 Allergy status to narcotic agent status: Secondary | ICD-10-CM | POA: Insufficient documentation

## 2017-05-01 DIAGNOSIS — N10 Acute tubulo-interstitial nephritis: Principal | ICD-10-CM | POA: Insufficient documentation

## 2017-05-01 DIAGNOSIS — N390 Urinary tract infection, site not specified: Secondary | ICD-10-CM

## 2017-05-01 DIAGNOSIS — B961 Klebsiella pneumoniae [K. pneumoniae] as the cause of diseases classified elsewhere: Secondary | ICD-10-CM | POA: Insufficient documentation

## 2017-05-01 DIAGNOSIS — Z882 Allergy status to sulfonamides status: Secondary | ICD-10-CM | POA: Insufficient documentation

## 2017-05-01 DIAGNOSIS — N12 Tubulo-interstitial nephritis, not specified as acute or chronic: Secondary | ICD-10-CM | POA: Diagnosis present

## 2017-05-01 DIAGNOSIS — E876 Hypokalemia: Secondary | ICD-10-CM | POA: Insufficient documentation

## 2017-05-01 DIAGNOSIS — R300 Dysuria: Secondary | ICD-10-CM

## 2017-05-01 DIAGNOSIS — E282 Polycystic ovarian syndrome: Secondary | ICD-10-CM | POA: Insufficient documentation

## 2017-05-01 DIAGNOSIS — E039 Hypothyroidism, unspecified: Secondary | ICD-10-CM | POA: Insufficient documentation

## 2017-05-01 HISTORY — DX: Depression, unspecified: F32.A

## 2017-05-01 LAB — COMPREHENSIVE METABOLIC PANEL
ALT: 13 U/L (ref 0–55)
AST (SGOT): 18 U/L (ref 10–42)
Albumin/Globulin Ratio: 1.05 Ratio (ref 0.70–1.50)
Albumin: 4.1 gm/dL (ref 3.5–5.0)
Alkaline Phosphatase: 79 U/L (ref 40–145)
Anion Gap: 14.5 mMol/L (ref 7.0–18.0)
BUN / Creatinine Ratio: 10.6 Ratio (ref 10.0–30.0)
BUN: 9 mg/dL (ref 7–22)
Bilirubin, Total: 0.6 mg/dL (ref 0.1–1.2)
CO2: 20.7 mMol/L (ref 20.0–30.0)
Calcium: 9.4 mg/dL (ref 8.5–10.5)
Chloride: 106 mMol/L (ref 98–110)
Creatinine: 0.85 mg/dL (ref 0.60–1.20)
EGFR: 93 mL/min/{1.73_m2} (ref 60–150)
Globulin: 3.9 gm/dL (ref 2.0–4.0)
Glucose: 95 mg/dL (ref 71–99)
Osmolality Calc: 274 mOsm/kg — ABNORMAL LOW (ref 275–300)
Potassium: 3.2 mMol/L — ABNORMAL LOW (ref 3.5–5.3)
Protein, Total: 8 gm/dL (ref 6.0–8.3)
Sodium: 138 mMol/L (ref 136–147)

## 2017-05-01 LAB — CBC AND DIFFERENTIAL
Basophils %: 0.5 % (ref 0.0–3.0)
Basophils Absolute: 0 10*3/uL (ref 0.0–0.3)
Eosinophils %: 0.9 % (ref 0.0–7.0)
Eosinophils Absolute: 0.1 10*3/uL (ref 0.0–0.8)
Hematocrit: 40.4 % (ref 36.0–48.0)
Hemoglobin: 13.1 gm/dL (ref 12.0–16.0)
Lymphocytes Absolute: 2.9 10*3/uL (ref 0.6–5.1)
Lymphocytes: 32.4 % (ref 15.0–46.0)
MCH: 29 pg (ref 28–35)
MCHC: 32 gm/dL (ref 32–36)
MCV: 88 fL (ref 80–100)
MPV: 6.9 fL (ref 6.0–10.0)
Monocytes Absolute: 0.9 10*3/uL (ref 0.1–1.7)
Monocytes: 9.8 % (ref 3.0–15.0)
Neutrophils %: 56.4 % (ref 42.0–78.0)
Neutrophils Absolute: 5.1 10*3/uL (ref 1.7–8.6)
PLT CT: 373 10*3/uL (ref 130–440)
RBC: 4.58 10*6/uL (ref 3.80–5.00)
RDW: 13.2 % (ref 11.0–14.0)
WBC: 9.1 10*3/uL (ref 4.0–11.0)

## 2017-05-01 LAB — VH URINALYSIS WITH MICROSCOPIC
Bilirubin, UA: NEGATIVE
Glucose, UA: NEGATIVE mg/dL
Ketones UA: NEGATIVE mg/dL
Leukocyte Esterase, UA: 75 Leu/uL — AB
Nitrite, UA: POSITIVE — AB
Protein, UR: 30 mg/dL — AB
RBC, UA: 4 /hpf (ref 0–5)
Squam Epithel, UA: 2 /hpf (ref 0–2)
Urine Specific Gravity: 1.024 (ref 1.001–1.040)
Urobilinogen, UA: NORMAL mg/dL
WBC, UA: 29 /hpf — ABNORMAL HIGH (ref 0–4)
pH, Urine: 5 pH (ref 5.0–8.0)

## 2017-05-01 LAB — HCG, SERUM, QUALITATIVE: BHCG Qualitative: NEGATIVE

## 2017-05-01 LAB — I-STAT LACTIC ACID
Lactic Acid I-Stat: 0.99 mMol/L (ref 0.50–2.10)
Room Number I-Stat: 25

## 2017-05-01 LAB — T4, FREE: T4 Free: 1.29 ng/dL (ref 0.70–1.48)

## 2017-05-01 LAB — TSH: TSH: 0.34 u[IU]/mL — ABNORMAL LOW (ref 0.40–4.20)

## 2017-05-01 LAB — VH I-STAT LACTIC ACID NOTIFICATION

## 2017-05-01 LAB — LIPASE: Lipase: 8 U/L (ref 8–78)

## 2017-05-01 MED ORDER — DIPHENHYDRAMINE HCL 50 MG/ML IJ SOLN
25.0000 mg | Freq: Four times a day (QID) | INTRAMUSCULAR | Status: DC | PRN
Start: 2017-05-01 — End: 2017-05-05
  Administered 2017-05-02 – 2017-05-05 (×11): 25 mg via INTRAVENOUS
  Filled 2017-05-01 (×11): qty 1

## 2017-05-01 MED ORDER — FENTANYL CITRATE (PF) 50 MCG/ML IJ SOLN (WRAP)
50.0000 ug | Freq: Four times a day (QID) | INTRAMUSCULAR | Status: DC | PRN
Start: 2017-05-01 — End: 2017-05-02
  Administered 2017-05-01 – 2017-05-02 (×3): 50 ug via INTRAVENOUS
  Filled 2017-05-01: qty 2
  Filled 2017-05-01: qty 1
  Filled 2017-05-01: qty 2

## 2017-05-01 MED ORDER — DIPHENHYDRAMINE HCL 50 MG/ML IJ SOLN
INTRAMUSCULAR | Status: AC
Start: 2017-05-01 — End: ?
  Filled 2017-05-01: qty 1

## 2017-05-01 MED ORDER — BUSPIRONE HCL 10 MG PO TABS
20.0000 mg | ORAL_TABLET | Freq: Two times a day (BID) | ORAL | Status: DC
Start: 2017-05-02 — End: 2017-05-05
  Administered 2017-05-02 – 2017-05-05 (×7): 20 mg via ORAL
  Filled 2017-05-01 (×8): qty 2

## 2017-05-01 MED ORDER — FENTANYL CITRATE (PF) 50 MCG/ML IJ SOLN (WRAP)
INTRAMUSCULAR | Status: AC
Start: 2017-05-01 — End: ?
  Filled 2017-05-01: qty 5

## 2017-05-01 MED ORDER — ONDANSETRON HCL 4 MG/2ML IJ SOLN
4.0000 mg | INTRAMUSCULAR | Status: DC | PRN
Start: 2017-05-01 — End: 2017-05-05
  Administered 2017-05-01: 4 mg via INTRAVENOUS
  Filled 2017-05-01: qty 2

## 2017-05-01 MED ORDER — PROMETHAZINE HCL 25 MG/ML IJ SOLN
INTRAMUSCULAR | Status: AC
Start: 2017-05-01 — End: ?
  Filled 2017-05-01: qty 1

## 2017-05-01 MED ORDER — VH SODIUM CHLORIDE BACTERIOSTATIC 0.9 % IJ SOLN (WRAPPED)
1.0000 g | INTRAMUSCULAR | Status: DC
Start: 2017-05-02 — End: 2017-05-02

## 2017-05-01 MED ORDER — ACETAMINOPHEN 325 MG PO TABS
650.0000 mg | ORAL_TABLET | ORAL | Status: DC | PRN
Start: 2017-05-01 — End: 2017-05-05

## 2017-05-01 MED ORDER — PROMETHAZINE HCL 25 MG/ML IJ SOLN
12.5000 mg | Freq: Once | INTRAMUSCULAR | Status: AC
Start: 2017-05-01 — End: 2017-05-01
  Administered 2017-05-01: 12.5 mg via INTRAVENOUS

## 2017-05-01 MED ORDER — ACETAMINOPHEN 650 MG RE SUPP
650.0000 mg | RECTAL | Status: DC | PRN
Start: 2017-05-01 — End: 2017-05-05

## 2017-05-01 MED ORDER — POTASSIUM CHLORIDE IN NACL 20-0.9 MEQ/L-% IV SOLN
INTRAVENOUS | Status: DC
Start: 2017-05-01 — End: 2017-05-01
  Filled 2017-05-01 (×2): qty 1000

## 2017-05-01 MED ORDER — ONDANSETRON HCL 4 MG/2ML IJ SOLN
INTRAMUSCULAR | Status: AC
Start: 2017-05-01 — End: ?
  Filled 2017-05-01: qty 2

## 2017-05-01 MED ORDER — VH SODIUM CHLORIDE BACTERIOSTATIC 0.9 % IJ SOLN (WRAPPED)
1.0000 g | Freq: Once | INTRAMUSCULAR | Status: AC
Start: 2017-05-01 — End: 2017-05-01
  Administered 2017-05-01: 1 g via INTRAVENOUS

## 2017-05-01 MED ORDER — KETOROLAC TROMETHAMINE 15 MG/ML IJ SOLN
15.0000 mg | Freq: Once | INTRAMUSCULAR | Status: AC
Start: 2017-05-01 — End: 2017-05-01
  Administered 2017-05-01: 15 mg via INTRAVENOUS

## 2017-05-01 MED ORDER — DIPHENHYDRAMINE HCL 50 MG/ML IJ SOLN
25.0000 mg | Freq: Once | INTRAMUSCULAR | Status: AC
Start: 2017-05-01 — End: 2017-05-01
  Administered 2017-05-01: 25 mg via INTRAVENOUS

## 2017-05-01 MED ORDER — ZOLPIDEM TARTRATE 5 MG PO TABS
10.0000 mg | ORAL_TABLET | Freq: Every evening | ORAL | Status: DC | PRN
Start: 2017-05-01 — End: 2017-05-05
  Administered 2017-05-01 – 2017-05-04 (×4): 10 mg via ORAL
  Filled 2017-05-01 (×4): qty 2

## 2017-05-01 MED ORDER — ALPRAZOLAM 0.5 MG PO TABS
0.5000 mg | ORAL_TABLET | Freq: Every day | ORAL | Status: DC | PRN
Start: 2017-05-01 — End: 2017-05-05

## 2017-05-01 MED ORDER — KETOROLAC TROMETHAMINE 15 MG/ML IJ SOLN
15.0000 mg | Freq: Four times a day (QID) | INTRAMUSCULAR | Status: DC | PRN
Start: 2017-05-01 — End: 2017-05-05
  Administered 2017-05-02 – 2017-05-04 (×3): 15 mg via INTRAVENOUS
  Filled 2017-05-01 (×3): qty 1

## 2017-05-01 MED ORDER — CEFTRIAXONE SODIUM 1 G IJ SOLR
INTRAMUSCULAR | Status: AC
Start: 2017-05-01 — End: ?
  Filled 2017-05-01: qty 1000

## 2017-05-01 MED ORDER — ACETAMINOPHEN 160 MG/5ML PO SOLN
650.0000 mg | ORAL | Status: DC | PRN
Start: 2017-05-01 — End: 2017-05-05

## 2017-05-01 MED ORDER — VALLEY PROMETHAZINE 50 MG/0.4 ML TOPICAL GEL UD (RPKG)
25.0000 mg | Freq: Four times a day (QID) | TOPICAL | Status: DC | PRN
Start: 2017-05-01 — End: 2017-05-05
  Administered 2017-05-02 – 2017-05-03 (×2): 25 mg via TOPICAL
  Filled 2017-05-01 (×2): qty 0.4

## 2017-05-01 MED ORDER — POTASSIUM CHLORIDE IN NACL 40-0.9 MEQ/L-% IV SOLN
INTRAVENOUS | Status: DC
Start: 2017-05-01 — End: 2017-05-05
  Administered 2017-05-02 (×2): 125 mL/h via INTRAVENOUS
  Administered 2017-05-04: 50 mL/h via INTRAVENOUS
  Filled 2017-05-01 (×9): qty 1000

## 2017-05-01 MED ORDER — ONDANSETRON HCL 4 MG/2ML IJ SOLN
4.0000 mg | Freq: Once | INTRAMUSCULAR | Status: AC
Start: 2017-05-01 — End: 2017-05-01
  Administered 2017-05-01: 4 mg via INTRAVENOUS

## 2017-05-01 MED ORDER — HEPARIN SODIUM (PORCINE) PF 5000 UNIT/0.5ML IJ SOLN
5000.0000 [IU] | Freq: Two times a day (BID) | INTRAMUSCULAR | Status: DC
Start: 2017-05-01 — End: 2017-05-05
  Administered 2017-05-02 – 2017-05-05 (×4): 5000 [IU] via SUBCUTANEOUS
  Filled 2017-05-01 (×9): qty 0.5

## 2017-05-01 MED ORDER — SODIUM CHLORIDE 0.9 % IV BOLUS
1000.0000 mL | Freq: Once | INTRAVENOUS | Status: AC
Start: 2017-05-01 — End: 2017-05-01
  Administered 2017-05-01: 1000 mL via INTRAVENOUS

## 2017-05-01 MED ORDER — PROCHLORPERAZINE EDISYLATE 5 MG/ML IJ SOLN
10.0000 mg | INTRAMUSCULAR | Status: DC | PRN
Start: 2017-05-01 — End: 2017-05-05

## 2017-05-01 MED ORDER — VH BIO-K PLUS PROBIOTIC 50 BIL CFU CAPSULE
50.0000 | DELAYED_RELEASE_CAPSULE | Freq: Every day | ORAL | Status: DC
Start: 2017-05-02 — End: 2017-05-05
  Administered 2017-05-02 – 2017-05-05 (×4): 50 via ORAL
  Filled 2017-05-01 (×4): qty 1

## 2017-05-01 MED ORDER — ONDANSETRON 4 MG PO TBDP
8.0000 mg | ORAL_TABLET | Freq: Three times a day (TID) | ORAL | Status: DC | PRN
Start: 2017-05-01 — End: 2017-05-05

## 2017-05-01 MED ORDER — VH SODIUM CHLORIDE BACTERIOSTATIC 0.9 % IJ SOLN (WRAPPED)
INTRAMUSCULAR | Status: AC
Start: 2017-05-01 — End: ?
  Filled 2017-05-01: qty 30

## 2017-05-01 MED ORDER — FLUOXETINE HCL 20 MG PO CAPS
40.0000 mg | ORAL_CAPSULE | Freq: Every day | ORAL | Status: DC
Start: 2017-05-02 — End: 2017-05-05
  Administered 2017-05-02 – 2017-05-05 (×4): 40 mg via ORAL
  Filled 2017-05-01 (×4): qty 2

## 2017-05-01 MED ORDER — FENTANYL CITRATE (PF) 50 MCG/ML IJ SOLN (WRAP)
25.0000 ug | Freq: Once | INTRAMUSCULAR | Status: AC
Start: 2017-05-01 — End: 2017-05-01
  Administered 2017-05-01: 25 ug via INTRAVENOUS

## 2017-05-01 MED ORDER — SODIUM CHLORIDE 0.9 % IJ SOLN
0.4000 mg | INTRAMUSCULAR | Status: DC | PRN
Start: 2017-05-01 — End: 2017-05-05

## 2017-05-01 MED ORDER — KETOROLAC TROMETHAMINE 15 MG/ML IJ SOLN
INTRAMUSCULAR | Status: AC
Start: 2017-05-01 — End: ?
  Filled 2017-05-01: qty 1

## 2017-05-01 NOTE — ED Notes (Signed)
Bed: S25-A  Expected date:   Expected time:   Means of arrival:   Comments:  Triage

## 2017-05-01 NOTE — ED Notes (Signed)
Pt stable for transfer. Breathing even/unlabored. Color appropriate. A & O X 4. Ambulatory with steady gait. Speaking with complete sentences. Pt reports pain is manageable. Transport ordered via Doctor, general practice.

## 2017-05-01 NOTE — ED Provider Notes (Addendum)
None        History     Chief Complaint   Patient presents with   . Abdominal Pain     HPI     Patient here for evaluation of abdominal pain. Patient had abdominal pain for last day. Pressure with urination. Pain is lower crampy in nature. Bilateral but may be more the right side now. She's had no vaginal discharge no hematuria. She's had low-grade fever 100.4 at home. She's had nausea vomiting. History of Crohn's disease but no palpitations has not been taking any medications for a while. She has been on Humira and past. No recent.    Past Medical History:   Diagnosis Date   . Abortion    . Anxiety    . Crohn's disease    . Depression    . H/O Hashimoto thyroiditis     hypothyroidism   . PCOS (polycystic ovarian syndrome)    . Tachycardia        Past Surgical History:   Procedure Laterality Date   . BREAST IMPLANT     . COLONOSCOPY     . D & C, SUCTION N/A 08/24/2014    Procedure: D & C, SUCTION;  Surgeon: Maryruth Hancock, MD;  Location: [facility];  Service: Obstetrics;  Laterality: N/A;   . INDUCED ABORTION         History reviewed. No pertinent family history.    Social  Social History   Substance Use Topics   . Smoking status: Never Smoker   . Smokeless tobacco: Never Used   . Alcohol use No      Comment: rare       .     Allergies   Allergen Reactions   . Bentyl [Dicyclomine] Hives   . Bactrim [Sulfamethoxazole W/Trimethoprim (Co-Trimoxazole)] Rash   . Morphine Rash     Tolerates fentanyl and hydromorphone   . Sulfa Antibiotics Rash       Home Medications     Med List Status:  Pharmacy Completed Set By: Hart Robinsons, PharmD at 05/01/2017  8:37 PM                ALPRAZolam (XANAX) 0.5 MG tablet     Take 0.5 mg by mouth daily as needed for Anxiety.         busPIRone (BUSPAR) 10 MG tablet     Take 20 mg by mouth 2 (two) times daily.Take 2 tablets (20 mg total) twice daily     FLUoxetine (PROZAC) 40 MG capsule     Take 40 mg by mouth daily.     ibuprofen (ADVIL,MOTRIN) 200 MG tablet     Take 400 mg by mouth  every 6 (six) hours as needed for Pain.     levothyroxine (SYNTHROID, LEVOTHROID) 88 MCG tablet     Take 88 mcg by mouth daily.     zolpidem (AMBIEN) 10 MG tablet     Take 10 mg by mouth nightly as needed for Sleep.           Review of Systems   Constitutional: Negative.  Negative for activity change, chills, fatigue and fever.   HENT: Negative.  Negative for congestion, sore throat and voice change.    Eyes: Negative.  Negative for visual disturbance.   Respiratory: Negative.  Negative for cough, chest tightness and shortness of breath.    Cardiovascular: Negative.  Negative for chest pain.   Gastrointestinal: Positive for abdominal pain, nausea and  vomiting. Negative for diarrhea.   Genitourinary: Positive for difficulty urinating, dysuria and frequency. Negative for decreased urine volume, pelvic pain, urgency, vaginal bleeding, vaginal discharge and vaginal pain.   Musculoskeletal: Negative.  Negative for back pain and neck pain.   Skin: Negative.  Negative for rash.   Neurological: Negative.  Negative for syncope, numbness and headaches.   Hematological: Negative for adenopathy.   Psychiatric/Behavioral: Negative.  The patient is not nervous/anxious.        Physical Exam    BP: (!) 124/100, Heart Rate: (!) 154, Temp: 100.3 F (37.9 C), Resp Rate: 18, SpO2: (!) 18 %, Weight: 47.7 kg    Physical Exam   Constitutional: She is oriented to person, place, and time. Vital signs are normal. She appears well-developed and well-nourished. She is active and cooperative. No distress.   HENT:   Head: Normocephalic and atraumatic.   Right Ear: Hearing, tympanic membrane, external ear and ear canal normal.   Left Ear: Hearing, tympanic membrane, external ear and ear canal normal.   Nose: Nose normal.   Mouth/Throat: Uvula is midline, oropharynx is clear and moist and mucous membranes are normal.   Eyes: Conjunctivae, EOM and lids are normal. Pupils are equal, round, and reactive to light.   Neck: Trachea normal, normal range  of motion, full passive range of motion without pain and phonation normal. Neck supple.   Cardiovascular: Normal rate, regular rhythm, S1 normal, S2 normal, normal heart sounds and intact distal pulses.    No murmur heard.  Pulmonary/Chest: Effort normal and breath sounds normal. She has no wheezes.   Abdominal: Soft. Normal appearance, normal aorta and bowel sounds are normal. She exhibits no distension. There is no tenderness. There is no rebound and no guarding.   Musculoskeletal: Normal range of motion. She exhibits no edema.   Neurological: She is alert and oriented to person, place, and time. She has normal strength. No cranial nerve deficit.   Skin: Skin is warm, dry and intact. No rash noted.   Psychiatric: She has a normal mood and affect. Her behavior is normal.   Nursing note and vitals reviewed.        MDM and ED Course     ED Medication Orders     Start Ordered     Status Ordering Provider    05/01/17 2035 05/01/17 2034    Continuous     Route: Intravenous     Discontinued Wyona Almas    05/01/17 2034 05/01/17 2033  promethazine (PHENERGAN) injection 12.5 mg  Once in ED     Route: Intravenous  Ordered Dose: 12.5 mg     Last MAR action:  Given Wyona Almas    05/01/17 2034 05/01/17 2033  diphenhydrAMINE (BENADRYL) injection 25 mg  Once in ED     Route: Intravenous  Ordered Dose: 25 mg     Last MAR action:  Given Etheleen Mayhew K    05/01/17 2010 05/01/17 2009  cefTRIAXone (ROCEPHIN) 1 g in sodium chloride bacteriostatic 0.9 % 10 mL Injection  Once in ED     Route: Intravenous  Ordered Dose: 1 g     Last MAR action:  Given Wyona Almas    05/01/17 1926 05/01/17 1925  fentaNYL (PF) (SUBLIMAZE) injection 25 mcg  Once in ED     Route: Intravenous  Ordered Dose: 25 mcg     Last MAR action:  Given Wyona Almas    05/01/17 1850 05/01/17 1849  ondansetron (  ZOFRAN) injection 4 mg  Once in ED     Route: Intravenous  Ordered Dose: 4 mg     Last MAR action:  Given Wyona Almas    05/01/17 1833 05/01/17 1832   ketorolac (TORADOL) injection 15 mg  Once in ED     Route: Intravenous  Ordered Dose: 15 mg     Last MAR action:  Given Dolora Ridgely K    05/01/17 1800 05/01/17 1759  sodium chloride 0.9 % bolus 1,000 mL  Once in ED     Route: Intravenous  Ordered Dose: 1,000 mL     Last MAR action:  Stopped Leila Schuff K         Results for orders placed or performed during the hospital encounter of 05/01/17   US Abdomen Complete    Narrative    Clinical History:  Reason For Exam: ABDOMINAL PAIN    Examination:  US ABDOMEN COMPLETE    Comparison:  None available.    Findings:  Abdominal aorta and inferior vena cava are normal in caliber. The pancreas is not well visualized due to overlying bowel gas.    The liver is normal in appearance. Liver length measures 13.6 cm.    The gallbladder is normal in appearance. There is no biliary tract dilatation with the common duct measuring 4 mm. There is no tenderness over the gallbladder.    The right kidney measures 8.7 cm in length. It is normal in appearance.    The left kidney measures 8.1 cm in length. It is normal in appearance. Incidental finding of a dromedary hump.    The spleen is normal in appearance, with length of 8.7 cm.    No free fluid is identified.      Impression    Essentially normal abdominal ultrasound exam. Pancreas is not visualized due to bowel gas.    ReadingStation:[email or web address]   CT Abdomen Pelvis W IV And PO Cont    Narrative    Clinical History:  Reason For Exam: ABDOMINAL PAIN; right upper quadrant persistant pain; recurrent pyelonephritis; immunocompromised  Nausea, vomiting, right upper quadrant pain. History of Crohns.    Technique:  CT of abdomen and pelvis with intravenous and oral contrast obtained. Multiplanar reconstructions performed.    CT images were acquired utilizing Automated Exposure Control for dose reduction.     Contrast:  IOHEXOL 350 MG/ML IV SOLN/100 mL    Comparison:  CT abdomen dated 10/23/2016    Findings:    Lower chest:  Lung bases are clear.  Partial visualization of bilateral breast implants. The heart size is normal.    Abdomen:  Is normal. The spleen is normal. Bilateral kidneys and adrenal glands are normal. The pancreatic parenchyma is normal.    The stomach is filled with contrast and food stuff material. Small bowel loops are normal. Colonic loops are normal. The terminal ileum ileum is normal. The appendix is not well seen on this exam.    There is no pathologic adenopathy identified in the abdomen. A few minimally prominent retroperitoneal nodes are visualized.    There is no free air or free fluid.    No evidence of a Crohn's flare.    Pelvis:  Bladder is normal. The uterus is mildly heterogeneous. In the right adnexa, there is a corpus luteal cyst that measures 2.4 cm AP. Left adnexa is unremarkable. There is no free air or free fluid in the pelvis. No pathologic adenopathy in the pelvis.    Bones  and Soft Tissues:  Unremarkable.      Impression    1. No acute findings in the abdomen or pelvis.  2. 2.4 cm right corpus luteal cyst.    ReadingStation:[email or web address]     Results for orders placed or performed during the hospital encounter of 05/01/17   Urine Culture    Narrative    Specimen/Source: Urine Specimens/Clean Catch  Collected: 05/01/2017 18:21     Status: Final      Last Updated: 05/03/2017 06:43           (1) *Specimen Urine      *Source Detail Clean Catch             ISO #1 (Final)      Greater than 100,000 CFU/mL      Klebsiella pneumoniae                               ISO #1                            Klebsiella pneumoniae                            ---------------------    MIC (mcg/ml)      Ampicillin/Sulbactam  4                S      (A/S)      Cefazolin (CFZ)       <=4              S      Ciprofloxacin (CP)    <=0.25           S      Gentamicin (GM)       <=1              S      Levofloxacin (LEV)    <=0.12           S      Nitrofurantoin (FD)   64               I      Pip/Tazo (PI/T)       <=4              S      Tobramycin (TO)        <=1              S      Trimeth/Sulfa (T/S)   <=20             S         Blood Culture - Venipuncture    Narrative    Specimen/Source: Blood/Venipuncture  Collected: 05/02/2017 12:32     Status: Final      Last Updated: 05/07/2017 20:43           (1) *Specimen Blood      *Source Detail Venipuncture             Culture Result (Final)      No Growth in 5 Days         CBC   Result Value Ref Range    WBC 9.1 4.0 - 11.0 K/cmm    RBC 4.58 3.80 - 5.00 M/cmm    Hemoglobin 13.1 12.0 - 16.0 gm/dL    Hematocrit 40.4  36.0 - 48.0 %    MCV 88 80 - 100 fL    MCH 29 28 - 35 pg    MCHC 32 32 - 36 gm/dL    RDW 38.7 56.4 - 33.2 %    PLT CT 373 130 - 440 K/cmm    MPV 6.9 6.0 - 10.0 fL    NEUTROPHIL % 56.4 42.0 - 78.0 %    Lymphocytes 32.4 15.0 - 46.0 %    Monocytes 9.8 3.0 - 15.0 %    Eosinophils % 0.9 0.0 - 7.0 %    Basophils % 0.5 0.0 - 3.0 %    Neutrophils Absolute 5.1 1.7 - 8.6 K/cmm    Lymphocytes Absolute 2.9 0.6 - 5.1 K/cmm    Monocytes Absolute 0.9 0.1 - 1.7 K/cmm    Eosinophils Absolute 0.1 0.0 - 0.8 K/cmm    BASO Absolute 0.0 0.0 - 0.3 K/cmm   CMP   Result Value Ref Range    Sodium 138 136 - 147 mMol/L    Potassium 3.2 (L) 3.5 - 5.3 mMol/L    Chloride 106 98 - 110 mMol/L    CO2 20.7 20.0 - 30.0 mMol/L    Calcium 9.4 8.5 - 10.5 mg/dL    Glucose 95 71 - 99 mg/dL    Creatinine 9.51 8.84 - 1.20 mg/dL    BUN 9 7 - 22 mg/dL    Protein, Total 8.0 6.0 - 8.3 gm/dL    Albumin 4.1 3.5 - 5.0 gm/dL    Alkaline Phosphatase 79 40 - 145 U/L    ALT 13 0 - 55 U/L    AST (SGOT) 18 10 - 42 U/L    Bilirubin, Total 0.6 0.1 - 1.2 mg/dL    Albumin/Globulin Ratio 1.05 0.70 - 1.50 Ratio    Anion Gap 14.5 7.0 - 18.0 mMol/L    BUN/Creatinine Ratio 10.6 10.0 - 30.0 Ratio    EGFR 93 60 - 150 mL/min/1.74m2    Osmolality Calc 274 (L) 275 - 300 mOsm/kg    Globulin 3.9 2.0 - 4.0 gm/dL   Lipase   Result Value Ref Range    Lipase 8 8 - 78 U/L   Beta HCG, Qual   Result Value Ref Range    BHCG Qual Negative    Urinalysis with Microscopic   Result Value Ref Range     Color, UA Yellow Colorless,Yellow,Straw    Clarity, UA Clear Clear    Specific Gravity, UR 1.024 1.001 - 1.040    pH, Urine 5.0 5.0 - 8.0 pH    Protein, UR 30 (A) Negative mg/dL    Glucose, UA Negative Negative mg/dL    Ketones UA Negative Negative,5 mg/dL    Bilirubin, UA Negative Negative    Blood, UA Small (A) Negative    Nitrite, UA Positive (A) Negative    Urobilinogen, UA Normal Normal mg/dL    Leukocyte Esterase, UA 75 (A) Negative Leu/uL    UR Micro Performed     WBC, UA 29 (H) 0 - 4 /hpf    RBC, UA 4 0 - 5 /hpf    Bacteria, UA Moderate (A) None /hpf    Squam Epithel, UA 2 0 - 2 /hpf   TSH   Result Value Ref Range    TSH 0.34 (L) 0.40 - 4.20 uIU/mL   I-Stat Lactic Acid   Result Value Ref Range    I-STAT Notification Istat Notification    T4, Free   Result Value Ref Range  T4 Free 1.29 0.70 - 1.48 ng/dL   Basic Metabolic Panel   Result Value Ref Range    Sodium 137 136 - 147 mMol/L    Potassium 4.2 3.5 - 5.3 mMol/L    Chloride 112 (H) 98 - 110 mMol/L    CO2 19.9 (L) 20.0 - 30.0 mMol/L    Calcium 8.0 (L) 8.5 - 10.5 mg/dL    Glucose 71 71 - 99 mg/dL    Creatinine 1.61 0.96 - 1.20 mg/dL    BUN 8 7 - 22 mg/dL    Anion Gap 9.3 7.0 - 18.0 mMol/L    BUN/Creatinine Ratio 11.0 10.0 - 30.0 Ratio    EGFR 112 60 - 150 mL/min/1.80m2    Osmolality Calc 271 (L) 275 - 300 mOsm/kg   HIV Ag/Ab 4th generation   Result Value Ref Range    HIV Ag/Ab, 4th Generation Nonreactive Nonreactive   CBC and differential   Result Value Ref Range    WBC 6.3 4.0 - 11.0 K/cmm    RBC 4.28 3.80 - 5.00 M/cmm    Hemoglobin 12.3 12.0 - 16.0 gm/dL    Hematocrit 04.5 40.9 - 48.0 %    MCV 91 80 - 100 fL    MCH 29 28 - 35 pg    MCHC 32 32 - 36 gm/dL    RDW 81.1 91.4 - 78.2 %    PLT CT 323 130 - 440 K/cmm    MPV 7.2 6.0 - 10.0 fL    NEUTROPHIL % 49.2 42.0 - 78.0 %    Lymphocytes 37.9 15.0 - 46.0 %    Monocytes 9.0 3.0 - 15.0 %    Eosinophils % 3.4 0.0 - 7.0 %    Basophils % 0.5 0.0 - 3.0 %    Neutrophils Absolute 3.1 1.7 - 8.6 K/cmm    Lymphocytes  Absolute 2.4 0.6 - 5.1 K/cmm    Monocytes Absolute 0.6 0.1 - 1.7 K/cmm    Eosinophils Absolute 0.2 0.0 - 0.8 K/cmm    BASO Absolute 0.0 0.0 - 0.3 K/cmm   Comprehensive metabolic panel   Result Value Ref Range    Sodium 134 (L) 136 - 147 mMol/L    Potassium 4.6 3.5 - 5.3 mMol/L    Chloride 106 98 - 110 mMol/L    CO2 22.3 20.0 - 30.0 mMol/L    Calcium 8.7 8.5 - 10.5 mg/dL    Glucose 78 71 - 99 mg/dL    Creatinine 9.56 2.13 - 1.20 mg/dL    BUN 6 (L) 7 - 22 mg/dL    Protein, Total 6.7 6.0 - 8.3 gm/dL    Albumin 3.4 (L) 3.5 - 5.0 gm/dL    Alkaline Phosphatase 73 40 - 145 U/L    ALT 11 0 - 55 U/L    AST (SGOT) 20 10 - 42 U/L    Bilirubin, Total 0.4 0.1 - 1.2 mg/dL    Albumin/Globulin Ratio 1.03 0.70 - 1.50 Ratio    Anion Gap 10.3 7.0 - 18.0 mMol/L    BUN/Creatinine Ratio 8.2 (L) 10.0 - 30.0 Ratio    EGFR 112 60 - 150 mL/min/1.32m2    Osmolality Calc 265 (L) 275 - 300 mOsm/kg    Globulin 3.3 2.0 - 4.0 gm/dL   Basic Metabolic Panel   Result Value Ref Range    Sodium 135 (L) 136 - 147 mMol/L    Potassium 3.9 3.5 - 5.3 mMol/L    Chloride 104 98 - 110 mMol/L  CO2 24.6 20.0 - 30.0 mMol/L    Calcium 8.4 (L) 8.5 - 10.5 mg/dL    Glucose 76 71 - 99 mg/dL    Creatinine 0.86 5.78 - 1.20 mg/dL    BUN 6 (L) 7 - 22 mg/dL    Anion Gap 46.9 7.0 - 18.0 mMol/L    BUN/Creatinine Ratio 7.9 (L) 10.0 - 30.0 Ratio    EGFR 106 60 - 150 mL/min/1.39m2    Osmolality Calc 266 (L) 275 - 300 mOsm/kg   CBC and differential   Result Value Ref Range    WBC 5.6 4.0 - 11.0 K/cmm    RBC 4.48 3.80 - 5.00 M/cmm    Hemoglobin 13.0 12.0 - 16.0 gm/dL    Hematocrit 62.9 52.8 - 48.0 %    MCV 90 80 - 100 fL    MCH 29 28 - 35 pg    MCHC 32 32 - 36 gm/dL    RDW 41.3 24.4 - 01.0 %    PLT CT 333 130 - 440 K/cmm    MPV 6.6 6.0 - 10.0 fL    NEUTROPHIL % 45.9 42.0 - 78.0 %    Lymphocytes 38.2 15.0 - 46.0 %    Monocytes 11.0 3.0 - 15.0 %    Eosinophils % 4.2 0.0 - 7.0 %    Basophils % 0.7 0.0 - 3.0 %    Neutrophils Absolute 2.6 1.7 - 8.6 K/cmm    Lymphocytes Absolute 2.2  0.6 - 5.1 K/cmm    Monocytes Absolute 0.6 0.1 - 1.7 K/cmm    Eosinophils Absolute 0.2 0.0 - 0.8 K/cmm    BASO Absolute 0.0 0.0 - 0.3 K/cmm   Basic Metabolic Panel   Result Value Ref Range    Sodium 134 (L) 136 - 147 mMol/L    Potassium 4.1 3.5 - 5.3 mMol/L    Chloride 102 98 - 110 mMol/L    CO2 25.1 20.0 - 30.0 mMol/L    Calcium 9.1 8.5 - 10.5 mg/dL    Glucose 77 71 - 99 mg/dL    Creatinine 2.72 5.36 - 1.20 mg/dL    BUN 6 (L) 7 - 22 mg/dL    Anion Gap 64.4 7.0 - 18.0 mMol/L    BUN/Creatinine Ratio 7.9 (L) 10.0 - 30.0 Ratio    EGFR 106 60 - 150 mL/min/1.60m2    Osmolality Calc 265 (L) 275 - 300 mOsm/kg   Magnesium   Result Value Ref Range    Magnesium 2.1 1.6 - 2.6 mg/dL   Phosphorus   Result Value Ref Range    Phosphorus 2.8 2.3 - 4.7 mg/dL   i-Stat Lactic AcID   Result Value Ref Range    Room Number, ISTAT 25     Sample, ISTAT Venous     Site, ISTAT OTHER     i-STAT Lactic acid 0.99 0.50 - 2.10 mMol/L           MDM    The patient present with signs and symptoms consistent with a urinary tract infection/pyelonephritis.  Evaluation and treatment has been initiated in the ER, but the patient has not had significant improvement in symptoms, appears ill enough and/or has illness/findings/co-morbidities that make admission for IV medications and fluids with further management the most appropriate disposition. Differential diagnosis has included but is not limited to urinary tract infection, urinary tract infection in pregnancy, dehydration, resistant urinary tract infection, renal obstruction, pyelonephritis, bacteremia, and sepsis.  Diagnostic impression and plan were discussed and agreed upon with the  patient and/or family.  Results of lab/radiology tests were reviewed and discussed with the patient and/or family. All questions were answered and concerns addressed.  Appropriate consultation was made for admission and further treatment of this patient.    < 30 min               Procedures    Clinical Impression &  Disposition     Clinical Impression  Final diagnoses:   UTI (urinary tract infection)        ED Disposition     ED Disposition Condition Date/Time Comment    Observation  Fri May 01, 2017  9:22 PM Admitting Physician: Ester Rink [[id number]]   Diagnosis: Pyelonephritis [914782]   Estimated Length of Stay: < 2 midnights   Tentative Discharge Plan?: Home or Self Care [1]   Patient Class: Observation [104]             Discharge Medication List as of 05/05/2017  1:49 PM      START taking these medications    Details   cefuroxime (CEFTIN) 250 MG tablet Take 1 tablet (250 mg total) by mouth every 12 (twelve) hours., Starting Tue 05/05/2017, Print      lactobacillus species (BIO-K PLUS) capsule Take 1 capsule (50 Billion CFU total) by mouth daily., Starting Wed 05/06/2017, Print      naloxone [facility]) 4 MG/0.1ML nasal spray 1 spray intranasally. If pt does not respond or relapses into respiratory depression call 911. Give additional doses every 2-3 min., Print      oxyCODONE-acetaminophen (PERCOCET) 5-325 MG per tablet Take 1 tablet by mouth every 6 (six) hours as needed for Pain.for up to 5 days, Starting Tue 05/05/2017, Until Sun 05/10/2017, Print                       Wyona Almas, MD  05/01/17 1850       Wyona Almas, MD  05/15/17 [phone]

## 2017-05-01 NOTE — ED Triage Notes (Signed)
Pt arrived with complaint of lower abd pain x 2 days. Pt reports hx of chrons but states the pain is different to that of Chron's. Pt reports some discomfort during urination and denies v/d. Pt is currently nauseated. Breathing even/unlabored. Color appropriate. A & O X 4. Ambulatory with steady gait. Speaking with complete sentences.

## 2017-05-01 NOTE — H&P (Signed)
[facility] NOTE                                                                                                                                                                      HISTORY AND PHYSICAL      Primary Care Providers:  PCP: Vivia Ewing, MD    Chief Complaint:  Fever  Nausea and vomiting    HPI:  [name] is a 31 y.o. Caucasian female, medical history of Crohn's disease in remission for the past 6 months, off immunosuppressive therapy for the past 6 months, depression with anxiety, hypothyroidism, presents  [facility] emergency room with the above chief complaint  Patient also describes urinary symptoms of dysuria with suprapubic pain, increased frequency. Had a low-grade fever of 100.18F at home. Developed nausea and vomiting and came to the ER to be evaluated  Low-grade fever 100.52F in the ER and tachycardic at 154  Received IV fluids with improvement in tachycardia; sinus tachycardia on cardiac monitor  Negative CBC, CMP with exception of a mild hypokalemia of 3.2  Abnormal TSH of 0.34 with normal free T4 1.29  Normal lactic acid of 0.99  Abnormal urinalysis consistent with infection with urine culture in process  Abdominal ultrasound without any evidence of obstructive uropathy  Received IV fluids, IV antibiotics, antipyretics and antiemetics therapy in the ER  Hospitalist service consulted and patient evaluated at bedside in the ER; admitted under observation status for UTI with pyelonephritis      Past Medical History:   Past Medical History:   Diagnosis Date   . Abortion    . Anxiety    . Crohn's disease    . Depression    . H/O Hashimoto thyroiditis     hypothyroidism   . PCOS (polycystic ovarian syndrome)    . Tachycardia        Past Surgical History:  Past Surgical History:   Procedure Laterality Date    . BREAST IMPLANT     . COLONOSCOPY     . D & C, SUCTION N/A 08/24/2014    Procedure: D & C, SUCTION;  Surgeon: Maryruth Hancock, MD;  [facility];  Service: Obstetrics;  Laterality: N/A;   . INDUCED ABORTION         Allergies:  Allergies   Allergen Reactions   . Bentyl [Dicyclomine] Hives   . Bactrim [Sulfamethoxazole W/Trimethoprim (Co-Trimoxazole)] Rash   . Morphine Rash     Tolerates fentanyl and hydromorphone   . Sulfa Antibiotics Rash       Outpatient Medications:  Current Facility-Administered Medications   Medication Dose Route Frequency Provider Last Rate Last Dose   . 0.9 % NaCl with KCl 20 mEq infusion  Intravenous Continuous Wyona Almas, MD         Current Outpatient Prescriptions   Medication Sig Dispense Refill   . busPIRone (BUSPAR) 10 MG tablet Take 20 mg by mouth 2 (two) times daily.Take 2 tablets (20 mg total) twice daily     . FLUoxetine (PROZAC) 40 MG capsule Take 40 mg by mouth daily.     Marland Kitchen ibuprofen (ADVIL,MOTRIN) 200 MG tablet Take 400 mg by mouth every 6 (six) hours as needed for Pain.     Marland Kitchen levothyroxine (SYNTHROID, LEVOTHROID) 88 MCG tablet Take 88 mcg by mouth daily.     Marland Kitchen ALPRAZolam (XANAX) 0.5 MG tablet Take 0.5 mg by mouth daily as needed for Anxiety.         Marland Kitchen zolpidem (AMBIEN) 10 MG tablet Take 10 mg by mouth nightly as needed for Sleep.  2       Social History:  Social History   Substance Use Topics   . Smoking status: Never Smoker   . Smokeless tobacco: Never Used   . Alcohol use No      Comment: rare       Illicit Drug Use:   Reviewed and negative    Designated Health Care Proxy:   Shahed Yeoman, father    Family History:  Family history was obtained and was non contributory for cardiac history, cancer history.     Review of Systems:  All systems reviewed including eyes, ENT, cardiovascular, respiratory, gastrointestinal, genitourinary, psychiatric, neurologic, integumentary, allergic/hematology,  are reviewed and found unremarkable except pertinent positives  mentioned in the history of present illness and past medical history.     Physical Exam:    Vital Signs:  BP 112/68   Pulse (!) 154   Temp 100.3 F (37.9 C) (Tympanic)   Wt 47.7 kg (105 lb 2.6 oz)   LMP  (LMP Unknown)   SpO2 100%   BMI 19.87 kg/m   No intake or output data in the 24 hours ending 05/01/17 2112  Wt Readings from Last 4 Encounters:   05/01/17 47.7 kg (105 lb 2.6 oz)   01/11/17 49.4 kg (108 lb 14.4 oz)   12/31/16 44.9 kg (99 lb)   11/03/16 (!) 44.1 kg (97 lb 3.6 oz)         1) General Appearance:  Ill looking young Caucasian female, in no apparent acute      cardiorespiratory nor painful distress.     2) HEENT: Head is atraumatic and normocephalic.       Eyes: Pink conjunctiva, anicteric sclera. Pupils are equally reactive to light and      accommodation. Extraocular muscles are intact.      ENT:  Patient has intact external auditory canal. No abnormal lesions or bleeding from      nose. Oral mucosa dry with no pharyngeal congestion, erythema or swelling.    3) Neck: Supple, with full range of motion without any discomfort. Trachea is central,       no JVD noted, no carotid bruit, no cervical masses palpable.    4) Chest: Clear to auscultation bilaterally, no adventitial sounds heard.    5) CVS:  S1, S2 normal intensity and regular rhythm. No murmurs, rubs or gallops appreciated.    6) Abdomen:  Soft, suprapubic tenderness to palpation with mild voluntary guarding without any rebound      tenderness or rigidity, non distended, no palpable mass. Bowel sounds audible.      Right costovertebral angle tenderness noted.  7) Extremities: 2+ pulses without edema, cyanosis or clubbing.    8) Musculoskeletal: No joint deformities, tenderness or swelling noted. Full range of      motion in all the joints examined.    9) Skin: Warm with normal skin turgor, no lesions seen.    10) Lymphatics: No lymphadenopathy in axillary, cervical and inguinal area.     11) Neurological: Lethargic but awake and oriented  x 4. Cranial nerves II-XII intact. No gross focal        neurological deficits noted.    12) Psychiatric:  Mood and affect are appropriate.     Labs: Reviewed by me    Labs Reviewed   COMPREHENSIVE METABOLIC PANEL - Abnormal; Notable for the following:        Result Value    Potassium 3.2 (*)     Osmolality Calc 274 (*)     All other components within normal limits   VH URINALYSIS WITH MICROSCOPIC - Abnormal; Notable for the following:     Protein, UR 30 (*)     Blood, UA Small (*)     Nitrite, UA Positive (*)     Leukocyte Esterase, UA 75 (*)     WBC, UA 29 (*)     Bacteria, UA Moderate (*)     All other components within normal limits   TSH - Abnormal; Notable for the following:     TSH 0.34 (*)     All other components within normal limits   VH CULTURE, URINE    Narrative:     Specimen/Source: Urine Specimens/Clean Catch  Collected: 05/01/2017 18:21     Status: Valued      Last Updated: 05/01/2017 19:31           (1) *Specimen Urine      *Source Detail Clean Catch             Culture Result (Prelim)      Culture In Progress         CBC AND DIFFERENTIAL   LIPASE   HCG, SERUM, QUALITATIVE   VH I-STAT LACTIC ACID NOTIFICATION   T4, FREE   I-STAT LACTIC ACID       EKG: Reviewed by me  None  Sinus tachycardia on cardiac monitor    Imaging Studies: Reviewed by me  US Abdomen Complete  Clinical History:  Reason For Exam: ABDOMINAL PAIN    Examination:  US ABDOMEN COMPLETE    Comparison:  None available.    Findings:  Abdominal aorta and inferior vena cava are normal in caliber. The pancreas is not well visualized due to overlying bowel gas.    The liver is normal in appearance. Liver length measures 13.6 cm.    The gallbladder is normal in appearance. There is no biliary tract dilatation with the common duct measuring 4 mm. There is no tenderness over the gallbladder.    The right kidney measures 8.7 cm in length. It is normal in appearance.    The left kidney measures 8.1 cm in length. It is normal in  appearance. Incidental finding of a dromedary hump.    The spleen is normal in appearance, with length of 8.7 cm.    No free fluid is identified.    IMPRESSION:   Essentially normal abdominal ultrasound exam. Pancreas is not visualized due to bowel gas.      Assessment and Plan:    1. UTI with right sided pyelonephritis  IV ceftriaxone  Urine culture  IV fluids  Antiemetic therapy  Antipyretic therapy  Pain control  Clear liquid diet for now and advance as tolerated    2. Hypokalemia from GI losses  Potassium chloride repletion in process  Repeat metabolic panel    3. Crohn's disease in remission  Last Humira about 6 months ago  No active inpatient intervention  Pain control as needed    4. Hypothyroidism with   Abnormal TSH with normal fT4   Resume outpatient levothyroxine    5. Depression with anxiety  Clinically stable mood on admission  Resume outpatient buspirone, Prozac and as needed Xanax    Plan of care discussed with patient and is in agreement as outlined, with verbalization of understanding and agreement    Disposition: Observation admission    GI Prophylaxis: None    DVT Prophylaxis: Heparin    CODE Status : FULL CODE    Ester Rink, MD  05/01/2017    9:12 PM      Note: This chart was generated by the Epic EMR system/speech recognition and may contain inherent errors or omissions not intended by the user. Grammatical errors, random word insertions, deletions, pronoun errors and incomplete sentences are occasional consequences of this technology due to software limitations. Not all errors are caught or corrected. If there are questions or concerns about the content of this note or information contained within the body of this dictation they should be addressed directly with the author for clarification

## 2017-05-02 LAB — BASIC METABOLIC PANEL
Anion Gap: 9.3 mMol/L (ref 7.0–18.0)
BUN / Creatinine Ratio: 11 Ratio (ref 10.0–30.0)
BUN: 8 mg/dL (ref 7–22)
CO2: 19.9 mMol/L — ABNORMAL LOW (ref 20.0–30.0)
Calcium: 8 mg/dL — ABNORMAL LOW (ref 8.5–10.5)
Chloride: 112 mMol/L — ABNORMAL HIGH (ref 98–110)
Creatinine: 0.73 mg/dL (ref 0.60–1.20)
EGFR: 112 mL/min/{1.73_m2} (ref 60–150)
Glucose: 71 mg/dL (ref 71–99)
Osmolality Calc: 271 mOsm/kg — ABNORMAL LOW (ref 275–300)
Potassium: 4.2 mMol/L (ref 3.5–5.3)
Sodium: 137 mMol/L (ref 136–147)

## 2017-05-02 LAB — HIV AG/AB 4TH GENERATION: HIV Ag/Ab, 4th Generation: NONREACTIVE

## 2017-05-02 MED ORDER — FENTANYL CITRATE (PF) 50 MCG/ML IJ SOLN (WRAP)
25.0000 ug | INTRAMUSCULAR | Status: DC | PRN
Start: 2017-05-02 — End: 2017-05-05
  Administered 2017-05-02 – 2017-05-05 (×17): 25 ug via INTRAVENOUS
  Filled 2017-05-02 (×17): qty 2

## 2017-05-02 MED ORDER — SODIUM CHLORIDE 0.9 % IV MBP
1000.0000 mg | Freq: Three times a day (TID) | INTRAVENOUS | Status: DC
Start: 2017-05-02 — End: 2017-05-05
  Administered 2017-05-02 – 2017-05-05 (×9): 1000 mg via INTRAVENOUS
  Filled 2017-05-02 (×3): qty 100
  Filled 2017-05-02 (×6): qty 1000
  Filled 2017-05-02 (×2): qty 100
  Filled 2017-05-02 (×2): qty 1000
  Filled 2017-05-02: qty 100
  Filled 2017-05-02: qty 1000
  Filled 2017-05-02 (×3): qty 100

## 2017-05-02 NOTE — Plan of Care (Signed)
Problem: Pain interferes with ability to perform ADL  Goal: Pain at adequate level as identified by patient  Outcome: Progressing   05/02/17 0124   Goal/Interventions addressed this shift   Pain at adequate level as identified by patient Identify patient comfort function goal;Assess for risk of opioid induced respiratory depression, including snoring/sleep apnea. Alert healthcare team of risk factors identified.;Assess pain on admission, during daily assessment and/or before any "as needed" intervention(s);Reassess pain within 30-60 minutes of any procedure/intervention, per Pain Assessment, Intervention, Reassessment (AIR) Cycle;Offer non-pharmacological pain management interventions;Evaluate if patient comfort function goal is met;Evaluate patient's satisfaction with pain management progress       Problem: Side Effects from Pain Analgesia  Goal: Patient will experience minimal side effects of analgesic therapy  Outcome: Progressing   05/02/17 0124   Goal/Interventions addressed this shift   Patient will experience minimal side effects of analgesic therapy Monitor/assess patient's respiratory status (RR depth, effort, breath sounds);Assess for changes in cognitive function       Comments: Pt up to floor from ER at 2220. Pt oriented to room. Pt reports pain at 7/10 but refuses prn Toradol at this time. Pt states she will wait for prn fentanyl. PT nausea was medicated per MAR with zofran, pt requesting benadryl, unable to given at this time as pt received dose in ER. Tele on. Fluids infusing per MAR. Pt has no other needs at this time. Will continue to monitor.

## 2017-05-02 NOTE — Progress Notes (Signed)
Date Time: 05/02/17 7:33 AM  Patient Name: [name], [name]       CSN: [id number]  Attending Physician: Otho Ket, Lochlin Eppinger  Primary Care Provider: Vivia Ewing, NP    Interim summary [facility] course)   31 y.o. F with medical history of Crohn's disease in remission for the past 6 months, off immunosuppressive therapy for the past 6 months, depression with anxiety, hypothyroidism, was admitted with   Fever , nausea and vomiting   Patient also described urinary symptoms of dysuria with suprapubic pain, increased frequency.   Had a low-grade fever of 100.58F at home.   At ED  She had  fever 100.50F  and tachycardia at 154/min   Received IV fluids with improvement in tachycardia; sinus tachycardia on cardiac monitor  Negative CBC, CMP with exception of a mild hypokalemia of 3.2  Abnormally low  TSH of 0.34 with normal free T4 1.29  Normal lactic acid of 0.99  Abnormal urinalysis consistent with infection with urine culture was sent from ED .    Abdominal ultrasound without any evidence of obstructive uropathy but showing Incidental finding of a dromedary hump.  Received IV fluids, IV antibiotics, antipyretics and antiemetics therapy in the ER        [place] abd on ;5.4.18  Abdominal aorta and inferior vena cava are normal in caliber. The pancreas is not well visualized due to overlying bowel gas.  The liver is normal in appearance. Liver length measures 13.6 cm.  The gallbladder is normal in appearance. There is no biliary tract dilatation with the common duct measuring 4 mm. There is no tenderness over the gallbladder.  The right kidney measures 8.7 cm in length. It is normal in appearance.  The left kidney measures 8.1 cm in length. It is normal in appearance.   Incidental finding of a dromedary hump.    The spleen is normal in appearance, with length of 8.7 cm.    No free fluid is identified.          SUBJECTIVE:     CC: Patient's SAYS , '' I typically get better with first dose  of Rocephin.  I have had multiple UTIs in the past.  I am IMMUNO compromised''            PHYSICAL EXAM:   Temp:  [97.9 F (36.6 C)-100.3 F (37.9 C)] 97.9 F (36.6 C)  Heart Rate:  [73-154] 73  Resp Rate:  [18] 18  BP: (100-124)/(49-100) 109/66  Body mass index is 20.24 kg/m.    Intake/Output Summary (Last 24 hours) at 05/02/17 0733  Last data filed at 05/02/17 0557   Gross per 24 hour   Intake              400 ml   Output                0 ml   Net              400 ml     Weight Monitoring 12/31/2016 01/09/2017 01/10/2017 01/10/2017 01/11/2017 05/01/2017 05/01/2017   Height 154.9 cm 154.9 cm 154.9 cm - - - 154.9 cm   Height Method Stated Stated - - - - Stated   Weight 44.906 kg 44.453 kg 46.494 kg 46.494 kg 49.397 kg 47.7 kg 48.58 kg   Weight Method Stated Stated Bed Scale Bed Scale Bed Scale Actual Standing Scale   BMI (calculated) 18.7 kg/m2 18.6 kg/m2 19.4 kg/m2 - - - 20.3 kg/m2  Exam:   General:thin  built, no acute distress   Psychiatry: Patient is awake, alert and oriented x 3, mood is appropriate   Respt: CTA bilaterally. No crackles or wheezing. No accessory muscle use   CVS: S1, S2 normal, RRR, no thrill   GI: soft and moderate tenderness in the right flank and right mid abdomen area.  No guarding or rigidity.  Normal bowel sound.    Musculoskeletal: No pitting edema, no cyanosis/clubbing.   Expected ROM all 4 extremities. No joint swelling     MEDS: (SCHEDULED/INFUSIONS/PRN)     Current Facility-Administered Medications   Medication Dose Route Frequency   . busPIRone  20 mg Oral BID   . cefTRIAXone  1 g Intravenous Q24H   . FLUoxetine  40 mg Oral Daily   . heparin (porcine)  5,000 Units Subcutaneous Q12H SCH   . lactobacillus species  50 Billion CFU Oral Daily     Current Facility-Administered Medications   Medication Dose Route Frequency Last Rate   . 0.9 % NaCl with KCl 40 mEq   Intravenous Continuous 125 mL/hr (05/02/17 0000)     Current Facility-Administered Medications   Medication Dose Route   .  acetaminophen  650 mg Oral    Or   . acetaminophen  650 mg per NG tube    Or   . acetaminophen  650 mg Rectal   . ALPRAZolam  0.5 mg Oral   . diphenhydrAMINE  25 mg Intravenous   . fentaNYL (PF)  50 mcg Intravenous   . ketorolac  15 mg Intravenous   . naloxone  0.4 mg Intravenous   . ondansetron  8 mg Oral    Or   . ondansetron  4 mg Intravenous   . prochlorperazine  10 mg Intravenous   . promethazine  25 mg Topical   . zolpidem  10 mg Oral         LABS:     Recent Labs  Lab 05/01/17  1826   WBC 9.1   RBC 4.58   Hemoglobin 13.1   Hematocrit 40.4   MCV 88   PLT CT 373         Recent Labs  Lab 05/01/17  1826   Sodium 138   Potassium 3.2*   Chloride 106   CO2 20.7   BUN 9   Creatinine 0.85   Glucose 95   EGFR 93   Calcium 9.4         Recent Labs  Lab 05/01/17  1826   ALT 13   AST (SGOT) 18   Bilirubin, Total 0.6   Albumin 4.1   Alkaline Phosphatase 79        No results found for: [id number]      IMAGING STUDIES:   US Abdomen Complete    Result Date: 05/01/2017  Essentially normal abdominal ultrasound exam. Pancreas is not visualized due to bowel gas. ReadingStation:[id number]      MICROBIOLOGY AND/OR TELEMETRY:     Urine cx in progress .    ASSESSMENT AND PLAN:      UTI with right sided pyelonephritis  Patient feels she is not getting better  She also concerned that she is immunocompromised and has had multiple UTI in the past   [place] iV ceftriaxone  Started on meropenem to cover ESBL bacteria   Urine culture in progress .  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  Patient gave oral consent for HIV test.  Test ordered and came back negative.        Hypokalemia from GI losses  Replace potassium  Check BMP in the morning     Crohn's disease in remission  Last dose of  Humira about 6 months ago  No acute GI issues   Out pt GI follow up  .    Hypothyroidism with   Low  TSH ; 0.34 with normal FT4 1.29.  Cont  levothyroxine     Depression with anxiety  Clinically stable mood  Cont  buspirone, Prozac and as needed Xanax.      DVT Prophylaxis: sq Heparin     Code Status: full code .    Disposition: Change ceftriaxone to meropenem until after culture report is finalized before we can narrow down.  Continue to treat UTI covering ESBL producing bacteria in an immunocompromised patient.  Blood culture in progress.  Urine culture in progress    Plan discussed with patient, nursing staff.    Signed by: Otho Ket, Laray Corbit  [address]  [place]  Office Ph: [phone]    Please contact at phone number [phone] if any questions.

## 2017-05-02 NOTE — Plan of Care (Addendum)
Problem: Bladder/Voiding  Goal: Free from infection  Outcome: Progressing   05/02/17 0930   Goal/Interventions addressed this shift   Free from infection Monitor/assess for signs and symptoms of infection     Goal: Patient will experience proper bladder emptying during admission  Outcome: Progressing   05/02/17 0930   Goal/Interventions addressed this shift   Patient will experience proper bladder emptying during admission Monitor intake and output;Encourage patient to empty bladder at regular intervals;Utilize bladder scans prior to or post void as appropriate       Comments: Patient A&o x4, no s/s of distress. IV fluids infusing. Administered pain medication for right flank pain. Patient reports RLQ tenderness upon palpation. Patient reports increased frequency, burning upon urination, difficulty starting stream, and reports only a few trickles of urine. Patient refused breakfast this am due to nausea, administered antiemetic. Patient afebrile this am.     An hour after administration of Toradol for pain and benadryl for nausea, patient reports 6/10 dull right flank pain, administered fentanyl for pain and phenergan topical for nausea. Will reassess for effectiveness. Pain medication and phenergan were effective.

## 2017-05-03 LAB — CBC AND DIFFERENTIAL
Basophils %: 0.5 % (ref 0.0–3.0)
Basophils Absolute: 0 10*3/uL (ref 0.0–0.3)
Eosinophils %: 3.4 % (ref 0.0–7.0)
Eosinophils Absolute: 0.2 10*3/uL (ref 0.0–0.8)
Hematocrit: 39 % (ref 36.0–48.0)
Hemoglobin: 12.3 gm/dL (ref 12.0–16.0)
Lymphocytes Absolute: 2.4 10*3/uL (ref 0.6–5.1)
Lymphocytes: 37.9 % (ref 15.0–46.0)
MCH: 29 pg (ref 28–35)
MCHC: 32 gm/dL (ref 32–36)
MCV: 91 fL (ref 80–100)
MPV: 7.2 fL (ref 6.0–10.0)
Monocytes Absolute: 0.6 10*3/uL (ref 0.1–1.7)
Monocytes: 9 % (ref 3.0–15.0)
Neutrophils %: 49.2 % (ref 42.0–78.0)
Neutrophils Absolute: 3.1 10*3/uL (ref 1.7–8.6)
PLT CT: 323 10*3/uL (ref 130–440)
RBC: 4.28 10*6/uL (ref 3.80–5.00)
RDW: 13.6 % (ref 11.0–14.0)
WBC: 6.3 10*3/uL (ref 4.0–11.0)

## 2017-05-03 LAB — COMPREHENSIVE METABOLIC PANEL
ALT: 11 U/L (ref 0–55)
AST (SGOT): 20 U/L (ref 10–42)
Albumin/Globulin Ratio: 1.03 Ratio (ref 0.70–1.50)
Albumin: 3.4 gm/dL — ABNORMAL LOW (ref 3.5–5.0)
Alkaline Phosphatase: 73 U/L (ref 40–145)
Anion Gap: 10.3 mMol/L (ref 7.0–18.0)
BUN / Creatinine Ratio: 8.2 Ratio — ABNORMAL LOW (ref 10.0–30.0)
BUN: 6 mg/dL — ABNORMAL LOW (ref 7–22)
Bilirubin, Total: 0.4 mg/dL (ref 0.1–1.2)
CO2: 22.3 mMol/L (ref 20.0–30.0)
Calcium: 8.7 mg/dL (ref 8.5–10.5)
Chloride: 106 mMol/L (ref 98–110)
Creatinine: 0.73 mg/dL (ref 0.60–1.20)
EGFR: 112 mL/min/{1.73_m2} (ref 60–150)
Globulin: 3.3 gm/dL (ref 2.0–4.0)
Glucose: 78 mg/dL (ref 71–99)
Osmolality Calc: 265 mOsm/kg — ABNORMAL LOW (ref 275–300)
Potassium: 4.6 mMol/L (ref 3.5–5.3)
Protein, Total: 6.7 gm/dL (ref 6.0–8.3)
Sodium: 134 mMol/L — ABNORMAL LOW (ref 136–147)

## 2017-05-03 MED ORDER — HYDROCODONE-ACETAMINOPHEN 5-325 MG PO TABS
1.0000 | ORAL_TABLET | Freq: Four times a day (QID) | ORAL | Status: DC | PRN
Start: 2017-05-03 — End: 2017-05-05
  Administered 2017-05-04 (×3): 1 via ORAL
  Filled 2017-05-03 (×3): qty 1

## 2017-05-03 NOTE — Plan of Care (Signed)
Problem: Pain interferes with ability to perform ADL  Goal: Pain at adequate level as identified by patient  Outcome: Progressing   05/03/17 0146   Goal/Interventions addressed this shift   Pain at adequate level as identified by patient Identify patient comfort function goal;Assess for risk of opioid induced respiratory depression, including snoring/sleep apnea. Alert healthcare team of risk factors identified.;Assess pain on admission, during daily assessment and/or before any "as needed" intervention(s);Reassess pain within 30-60 minutes of any procedure/intervention, per Pain Assessment, Intervention, Reassessment (AIR) Cycle;Evaluate if patient comfort function goal is met;Offer non-pharmacological pain management interventions;Consult/collaborate with Physical Therapy, Occupational Therapy, and/or Speech Therapy;Include patient/patient care companion in decisions related to pain management as needed;Evaluate patient's satisfaction with pain management progress;Consult/collaborate with Pain Service       Problem: Side Effects from Pain Analgesia  Goal: Patient will experience minimal side effects of analgesic therapy  Outcome: Progressing   05/03/17 0146   Goal/Interventions addressed this shift   Patient will experience minimal side effects of analgesic therapy Monitor/assess patient's respiratory status (RR depth, effort, breath sounds);Assess for changes in cognitive function;Prevent/manage side effects per LIP orders (i.e. nausea, vomiting, pruritus, constipation, urinary retention, etc.);Evaluate for opioid-induced sedation with appropriate assessment tool (i.e. POSS)       Comments: Assumed care of patient at 1900. Assessment complete. Medications given per MAR. IVF infusing per MAR. Patient treated with PRN medications as requested and per [facility]. No s/s of distress noted at this time. Call bell within reach, will continue to monitor.

## 2017-05-03 NOTE — Progress Notes (Addendum)
0700: Assumed care of pt, assessment complete. Pt states pain is still a 5 in abdomen - will administer pain medication per [facility] when it is due. Denies other needs at this time. Whiteboard updated, call bell within reach, tele intact. Will continue to monitor.     4098: Tele D/C'd and IVF rate decreased to 50ml/hr per orders.     1035: Pt states she is having 7/10 abdominal pain and nausea. Fentanyl and phenergan administered per MAR.     1300: Pt requested a different pain medication for pain. Offered to give Toradol since Fentanyl was not due yet but pt declined. Dr. Lauro Regulus notified and ordered Norco q6h PRN. Pt states that Norco does not help, requested Toradol until Fentanyl dose due.     1445: Pt still states abdominal and flank pain is 7/10, Fentanyl administered per order.     1550: Pt states pain is now a 3/10 but is nauseated. Benadryl administered per MAR.

## 2017-05-03 NOTE — Progress Notes (Signed)
Date Time: 05/03/17 11:30 AM  Patient Name: [name], [name]       CSN: [id number]  Attending Physician: Karren Cobble, *  Primary Care Provider: Vivia Ewing, NP    Interim summary [facility] course)   31 y.o. F with medical history of Crohn's disease in remission for the past 6 months, off immunosuppressive therapy for the past 6 months, depression with anxiety, hypothyroidism, was admitted with fever , nausea and vomiting patient also described urinary symptoms of dysuria with suprapubic pain, increased frequency.Had a low-grade fever of 100.79F at home.   At ED  She had  fever 100.12F  and tachycardia at 154/min   Received IV fluids with improvement in tachycardia; sinus tachycardia on cardiac monitor  Negative CBC, CMP with exception of a mild hypokalemia of 3.2  Abnormally low  TSH of 0.34 with normal free T4 1.29  Normal lactic acid of 0.99  Abnormal urinalysis consistent with infection with urine culture Grew Klebsiella pneumonia    Abdominal ultrasound without any evidence of obstructive uropathy but showing Incidental finding of a dromedary hump.  Received IV fluids, IV antibiotics, antipyretics and antiemetics therapy in the ER    [place] abd on ;5.4.18  Abdominal aorta and inferior vena cava are normal in caliber. The pancreas is not well visualized due to overlying bowel gas.  The liver is normal in appearance. Liver length measures 13.6 cm.  The gallbladder is normal in appearance. There is no biliary tract dilatation with the common duct measuring 4 mm. There is no tenderness over the gallbladder.  The right kidney measures 8.7 cm in length. It is normal in appearance.  The left kidney measures 8.1 cm in length. It is normal in appearance.   Incidental finding of a dromedary hump.  The spleen is normal in appearance, with length of 8.7 cm.  No free fluid is identified.    SUBJECTIVE:     CC: Patient's SAYS , '' I typically get better with first dose of  Rocephin.  I have had multiple UTIs in the past.  I am IMMUNO compromised''  5/6 feeling much better since he started the meropenem symptoms improving no fever for 24 hours    PHYSICAL EXAM:   Temp:  [98.5 F (36.9 C)-99.1 F (37.3 C)] 99.1 F (37.3 C)  Heart Rate:  [76-102] 80  Resp Rate:  [16-22] 18  BP: (101-120)/(52-73) 109/64  Body mass index is 20.24 kg/m.    Intake/Output Summary (Last 24 hours) at 05/03/17 1130  Last data filed at 05/03/17 9811   Gross per 24 hour   Intake          4538.33 ml   Output             3325 ml   Net          1213.33 ml     Weight Monitoring 12/31/2016 01/09/2017 01/10/2017 01/10/2017 01/11/2017 05/01/2017 05/01/2017   Height 154.9 cm 154.9 cm 154.9 cm - - - 154.9 cm   Height Method Stated Stated - - - - Stated   Weight 44.906 kg 44.453 kg 46.494 kg 46.494 kg 49.397 kg 47.7 kg 48.58 kg   Weight Method Stated Stated Bed Scale Bed Scale Bed Scale Actual Standing Scale   BMI (calculated) 18.7 kg/m2 18.6 kg/m2 19.4 kg/m2 - - - 20.3 kg/m2         Exam:   General:thin  built, no acute distress   Psychiatry: Patient is awake, alert and oriented x  3, mood is appropriate   Respt: CTA bilaterally. No crackles or wheezing. No accessory muscle use   CVS: S1, S2 normal, RRR, no thrill   GI: soft and moderate tenderness in the right flank and right mid abdomen area.  No guarding or rigidity.  Normal bowel sound.  Musculoskeletal: No pitting edema, no cyanosis/clubbing.   Expected ROM all 4 extremities. No joint swelling     MEDS: (SCHEDULED/INFUSIONS/PRN)     Current Facility-Administered Medications   Medication Dose Route Frequency   . busPIRone  20 mg Oral BID   . FLUoxetine  40 mg Oral Daily   . heparin (porcine)  5,000 Units Subcutaneous Q12H SCH   . lactobacillus species  50 Billion CFU Oral Daily   . meropenem  1,000 mg Intravenous Q8H     Current Facility-Administered Medications   Medication Dose Route Frequency Last Rate   . 0.9 % NaCl with KCl 40 mEq   Intravenous Continuous 50 mL/hr  (05/03/17 0950)     Current Facility-Administered Medications   Medication Dose Route   . acetaminophen  650 mg Oral    Or   . acetaminophen  650 mg per NG tube    Or   . acetaminophen  650 mg Rectal   . ALPRAZolam  0.5 mg Oral   . diphenhydrAMINE  25 mg Intravenous   . fentaNYL (PF)  25 mcg Intravenous   . ketorolac  15 mg Intravenous   . naloxone  0.4 mg Intravenous   . ondansetron  8 mg Oral    Or   . ondansetron  4 mg Intravenous   . prochlorperazine  10 mg Intravenous   . promethazine  25 mg Topical   . zolpidem  10 mg Oral         LABS:       Recent Labs  Lab 05/03/17  0459 05/01/17  1826   WBC 6.3 9.1   RBC 4.28 4.58   Hemoglobin 12.3 13.1   Hematocrit 39.0 40.4   MCV 91 88   PLT CT 323 373         Recent Labs  Lab 05/03/17  0459 05/02/17  0733   Sodium 134* 137   Potassium 4.6 4.2   Chloride 106 112*   CO2 22.3 19.9*   BUN 6* 8   Creatinine 0.73 0.73   Glucose 78 71   EGFR 112 112   Calcium 8.7 8.0*         Recent Labs  Lab 05/03/17  0459 05/01/17  1826   ALT 11 13   AST (SGOT) 20 18   Bilirubin, Total 0.4 0.6   Albumin 3.4* 4.1   Alkaline Phosphatase 73 79        No results found for: [id number]      IMAGING STUDIES:   US Abdomen Complete    Result Date: 05/01/2017  Essentially normal abdominal ultrasound exam. Pancreas is not visualized due to bowel gas. ReadingStation:[id number]      MICROBIOLOGY AND/OR TELEMETRY:     Urine cx Klebsiella pneumonia  Blood culture pending    ASSESSMENT AND PLAN:      UTI with right sided pyelonephritis improving since starting meropenem will continue 1 more day and switched to Keflex and sent home tomorrow on by mouth Keflex  She also concerned that she is immunocompromised and has had multiple UTI in the past   iV ceftriaxone was stopped yesterday  Started on meropenem to cover ESBL bacteria  Urine culture grew Klebsiella pneumonia.  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  Patient gave oral consent for HIV test.  Test ordered and came back negative.        Hypokalemia from GI losses  Replace potassium  Check BMP in the morning     Crohn's disease in remission  Last dose of  Humira about 6 months ago  No acute GI issues   Out pt GI follow up .    Hypothyroidism with   Low  TSH ; 0.34 with normal FT4 1.29.  Cont  levothyroxine     Depression with anxiety  Clinically stable mood  Cont  buspirone, Prozac and as needed Xanax.      DVT Prophylaxis: sq Heparin     Code Status: full code .    Disposition: Change ceftriaxone to meropenem until after culture report is finalized before we can narrow down.  Continue to treat UTI covering ESBL producing bacteria in an immunocompromised patient.  Blood culture pending  Urine culture Klebsiella pneumonia  Plan discussed with patient, nursing staff.    Signed by: Edmon Crape, MD  [address]  [place]  Office Ph: [phone]    Please contact at phone number [phone] if any questions.

## 2017-05-03 NOTE — Plan of Care (Signed)
Problem: Pain interferes with ability to perform ADL  Goal: Pain at adequate level as identified by patient  Outcome: Progressing   05/03/17 0146   Goal/Interventions addressed this shift   Pain at adequate level as identified by patient Identify patient comfort function goal;Assess for risk of opioid induced respiratory depression, including snoring/sleep apnea. Alert healthcare team of risk factors identified.;Assess pain on admission, during daily assessment and/or before any "as needed" intervention(s);Reassess pain within 30-60 minutes of any procedure/intervention, per Pain Assessment, Intervention, Reassessment (AIR) Cycle;Evaluate if patient comfort function goal is met;Offer non-pharmacological pain management interventions;Consult/collaborate with Physical Therapy, Occupational Therapy, and/or Speech Therapy;Include patient/patient care companion in decisions related to pain management as needed;Evaluate patient's satisfaction with pain management progress;Consult/collaborate with Pain Service       Problem: Side Effects from Pain Analgesia  Goal: Patient will experience minimal side effects of analgesic therapy  Outcome: Progressing   05/03/17 0146   Goal/Interventions addressed this shift   Patient will experience minimal side effects of analgesic therapy Monitor/assess patient's respiratory status (RR depth, effort, breath sounds);Assess for changes in cognitive function;Prevent/manage side effects per LIP orders (i.e. nausea, vomiting, pruritus, constipation, urinary retention, etc.);Evaluate for opioid-induced sedation with appropriate assessment tool (i.e. POSS)       Problem: Bladder/Voiding  Goal: Free from infection  Outcome: Progressing   05/02/17 0930   Goal/Interventions addressed this shift   Free from infection Monitor/assess for signs and symptoms of infection     Goal: Patient will experience proper bladder emptying during admission  Outcome: Progressing   05/02/17 0930   Goal/Interventions  addressed this shift   Patient will experience proper bladder emptying during admission Monitor intake and output;Encourage patient to empty bladder at regular intervals;Utilize bladder scans prior to or post void as appropriate

## 2017-05-04 ENCOUNTER — Observation Stay: Payer: Commercial Managed Care - PPO

## 2017-05-04 LAB — BASIC METABOLIC PANEL
Anion Gap: 10.3 mMol/L (ref 7.0–18.0)
BUN / Creatinine Ratio: 7.9 Ratio — ABNORMAL LOW (ref 10.0–30.0)
BUN: 6 mg/dL — ABNORMAL LOW (ref 7–22)
CO2: 24.6 mMol/L (ref 20.0–30.0)
Calcium: 8.4 mg/dL — ABNORMAL LOW (ref 8.5–10.5)
Chloride: 104 mMol/L (ref 98–110)
Creatinine: 0.76 mg/dL (ref 0.60–1.20)
EGFR: 106 mL/min/{1.73_m2} (ref 60–150)
Glucose: 76 mg/dL (ref 71–99)
Osmolality Calc: 266 mOsm/kg — ABNORMAL LOW (ref 275–300)
Potassium: 3.9 mMol/L (ref 3.5–5.3)
Sodium: 135 mMol/L — ABNORMAL LOW (ref 136–147)

## 2017-05-04 MED ORDER — IOHEXOL 350 MG/ML IV SOLN
100.0000 mL | Freq: Once | INTRAVENOUS | Status: AC | PRN
Start: 2017-05-04 — End: 2017-05-04
  Administered 2017-05-04: 100 mL via INTRAVENOUS

## 2017-05-04 MED ORDER — BARIUM SULFATE 2.1 % PO SUSP
450.0000 mL | Freq: Once | ORAL | Status: DC | PRN
Start: 2017-05-04 — End: 2017-05-05

## 2017-05-04 NOTE — Plan of Care (Addendum)
1021: Pt resting in bed. Pt treated for nausea with PRN IV benadryl. Pt treated for pain with PRN PO norco. Pt denies other needs. Assessment complete. Will continue to monitor.    1156: Pt treated for pain. Pt scheduled for CT abd/pelvis with PO and IV contrast. Only IV is in her hand. No good veins seen/felt. Pt reports her ACs are pretty bad-that the ER tried multiple times and failed. VAT contacted-stated they will be up as soon as they are able. Pt denied other needs. Will continue to monitor.    1859: Another nurse on floor able to get LAC IV in before CT scan. Pt went to CT scan and returned. CT abd/pelvis negative for anything acute. Pt treated multiple times for pain. Pt treated again for nausea. Pt denies other needs. Will continue to monitor.    Problem: Pain interferes with ability to perform ADL  Goal: Pain at adequate level as identified by patient  Outcome: Progressing   05/04/17 1021   Goal/Interventions addressed this shift   Pain at adequate level as identified by patient Identify patient comfort function goal;Assess for risk of opioid induced respiratory depression, including snoring/sleep apnea. Alert healthcare team of risk factors identified.;Assess pain on admission, during daily assessment and/or before any "as needed" intervention(s);Reassess pain within 30-60 minutes of any procedure/intervention, per Pain Assessment, Intervention, Reassessment (AIR) Cycle;Offer non-pharmacological pain management interventions;Evaluate if patient comfort function goal is met;Evaluate patient's satisfaction with pain management progress;Include patient/patient care companion in decisions related to pain management as needed

## 2017-05-04 NOTE — Plan of Care (Signed)
Problem: Pain interferes with ability to perform ADL  Goal: Pain at adequate level as identified by patient  Outcome: Progressing   05/04/17 0003   Goal/Interventions addressed this shift   Pain at adequate level as identified by patient Identify patient comfort function goal;Assess for risk of opioid induced respiratory depression, including snoring/sleep apnea. Alert healthcare team of risk factors identified.;Assess pain on admission, during daily assessment and/or before any "as needed" intervention(s);Evaluate if patient comfort function goal is met;Offer non-pharmacological pain management interventions;Evaluate patient's satisfaction with pain management progress;Reassess pain within 30-60 minutes of any procedure/intervention, per Pain Assessment, Intervention, Reassessment (AIR) Cycle;Consult/collaborate with Pain Service;Consult/collaborate with Physical Therapy, Occupational Therapy, and/or Speech Therapy;Include patient/patient care companion in decisions related to pain management as needed       Problem: Bladder/Voiding  Goal: Free from infection  Outcome: Progressing   05/04/17 0003   Goal/Interventions addressed this shift   Free from infection Monitor/assess for signs and symptoms of infection;Assess need for indwelling catheter every shift and discuss with LIP

## 2017-05-04 NOTE — Progress Notes (Signed)
[facility]   [address]   [place] [address]     INITIAL ASSESSMENT  Case Management       Estimated D/C Date:     5/8   RX Coverage:       yes   Inpatient Plan of Care:      UTI with right sided pyleonephritis- per MD notes, anticipated Jerome on PO Keflex when medically ready.    CM Interventions:      CM met with patient at bedside- independent, drives, insured, plans [place] home with family support when medically ready. CM following.           05/04/17 1248   Patient Type   Within 30 Days of Previous Admission? No   Healthcare Decisions   Interviewed: Patient   Orientation/Decision Making Abilities of Patient Alert and Oriented x3, able to make decisions   Prior to admission   Prior level of function Independent with ADLs  (Drives)   Type of Residence Private residence   Living Arrangements Spouse/significant other   Dressing Independent   Grooming Independent   Feeding Independent   Bathing Independent   Toileting Independent   Discharge Planning   Support Systems Spouse/significant other   Patient expects to be discharged to: home   Anticipated Ernest plan discussed with: Same as interviewed   Mode of transportation: Private car (family member)   Consults/Providers   Correct PCP listed in Epic? Yes  (Stephanie Schutter)   Suzan Slick. Dorthy Cooler BSN, RN  Geophysicist/field seismologist. [phone]

## 2017-05-04 NOTE — Progress Notes (Signed)
This is a recommendation based on chart review only.      Antimicrobial Stewardship Note      [name] is a 31 y.o. admitted with urinary symptoms, dysuria, suprapubic pain, increased frequency. Day 4 of IV antibiotics - currently on meropenem. Afebrile, WBC WNL.     05/01/17 Urine: K. Pneumoniae > 100,000, I nitrofurantoin, S to all others       Recommendation:     1. Please de-escalate antibiotics. Noted plans to de-escalate to PO Keflex on discharge - no need to wait until discharge to de-escalate. Patient is taking other PO medications and no indication for carbapenem (no ESBL documented)         Objective:     Allergies   Allergen Reactions   . Bentyl [Dicyclomine] Hives   . Bactrim [Sulfamethoxazole W/Trimethoprim (Co-Trimoxazole)] Rash   . Morphine Rash     Tolerates fentanyl and hydromorphone   . Sulfa Antibiotics Rash        Height: 1.549 m (5\' 1" )  Weight:  48.6 kg (107 lb 1.6 oz)    Temp (24hrs), Avg:99.1 F (37.3 C), Min:98.8 F (37.1 C), Max:99.3 F (37.4 C)      Lab Results   Component Value Date    CREAT 0.76 05/04/2017        Lab Results   Component Value Date    WBC 6.3 05/03/2017          Microbiology Results     Procedure Component Value Units Date/Time    Blood Culture - Venipuncture [[id number]] Collected:  05/02/17 1232    Specimen:  Blood from Venipuncture Updated:  05/04/17 0717    Narrative:       Specimen/Source: Blood/Venipuncture  Collected: 05/02/2017 12:32     Status: Valued      Last Updated: 05/04/2017 07:15           (1) *Specimen Blood      *Source Detail Venipuncture             Culture Result (Prelim)      No Growth To Date          Urine Culture [[id number]] Collected:  05/01/17 1821    Specimen:  Urine from Clean Catch Updated:  05/03/17 0643    Narrative:       Specimen/Source: Urine Specimens/Clean Catch  Collected: 05/01/2017 18:21     Status: Final      Last Updated: 05/03/2017 06:43           (1) *Specimen Urine      *Source Detail Clean Catch             ISO #1  (Final)      Greater than 100,000 CFU/mL      Klebsiella pneumoniae                               ISO #1                            Klebsiella pneumoniae                            ---------------------    MIC (mcg/ml)      Ampicillin/Sulbactam  4                S      (A/S)  Cefazolin (CFZ)       <=4              S      Ciprofloxacin (CP)    <=0.25           S      Gentamicin (GM)       <=1              S      Levofloxacin (LEV)    <=0.12           S      Nitrofurantoin (FD)   64               I      Pip/Tazo (PI/T)       <=4              S      Tobramycin (TO)       <=1              S      Trimeth/Sulfa (T/S)   <=20             S                        Thanks,   The Antimicrobial Stewardship Team   Wynelle Beckmann, PharmD Phone# [phone]  Rosiland Oz, MD, Pager# [phone]

## 2017-05-04 NOTE — Plan of Care (Signed)
Problem: Pain interferes with ability to perform ADL  Goal: Pain at adequate level as identified by patient  Outcome: Progressing      Problem: Moderate/High Fall Risk Score >5  Goal: Patient will remain free of falls  Outcome: Progressing   05/04/17 1019   OTHER   Moderate Risk (6-13) MOD-(VH Only) Yellow "Fall Risk" signage;MOD-(VH Only) Yellow slippers;MOD-(VH Only) Apply yellow "Fall Risk" arm band;MOD-Consider activation of bed alarm if appropriate;LOW-Fall Interventions Appropriate for Low Fall Risk

## 2017-05-04 NOTE — UM Notes (Signed)
[facility] Utilization Management Review Sheet    Facility :  [facility]    NAME: Dianca Owensby  MR#: [id number]    CSN#: [id number]     DOB: 29-Jun-1986     ROOM: 540/540-A AGE: 31 y.o.    ADMIT DATE AND TIME: 05/01/2017  5:52 PM      PATIENT CLASS: Observation 05/01/2017 @ 2122     ATTENDING PHYSICIAN: Alease Medina, MD  PAYOR:Payor: Monia Pouch / Plan: [employer] PPO / Product Type: COMMERCIAL /       AUTH #:     DIAGNOSIS:     ICD-10-CM    1. UTI (urinary tract infection) N39.0        HISTORY:   Past Medical History:   Diagnosis Date   . Abortion    . Anxiety    . Crohn's disease    . Depression    . H/O Hashimoto thyroiditis     hypothyroidism   . PCOS (polycystic ovarian syndrome)    . Tachycardia      05/01/2017    ED TREATMENT: Rocephin 1g IV Benadryl 25mg  IV Fentanyl IV Toradol 15mg  IV Zofran 4mg  IV Phenergan 12.5mg  IV NS bolus     "Patient presents to the ED with complaints of fever, nausea and vomiting.  Patient also describes urinary symptoms of dysuria with suprapubic pain, increased frequency. Had a low-grade fever of 100.46F at home. Developed nausea and vomiting and came to the ER to be evaluated." per Dr. Barnet Pall H&P    LABS: Potassium 3.2 Osmolality 274 TSH 0.34 UA: Leukocyte Esterase 75 Nitrite Positive Protein 30 Blood Small WBC 29 Bacteria Moderate    URINE CULTURE:  ISO #1 (Final)   Greater than 100,000 CFU/mL   Klebsiella pneumoniae    US Abdomen Complete: Essentially normal abdominal ultrasound exam. Pancreas is not visualized due to bowel gas.      VITALS: T100.3 P154 R18 BP124/100 Sat 96%    ASSESSMENT AND PLAN:  1. UTI with right sided pyelonephritis  IV ceftriaxone  Urine culture  IV fluids  Antiemetic therapy  Antipyretic therapy  Pain control  Clear liquid diet for now and advance as tolerated    2. Hypokalemia from GI losses  Potassium chloride repletion in process  Repeat metabolic panel    3. Crohn's disease in remission  Last Humira about 6 months ago  No  active inpatient intervention  Pain control as needed    4. Hypothyroidism with   Abnormal TSH with normal fT4   Resume outpatient levothyroxine    5. Depression with anxiety  Clinically stable mood on admission  Resume outpatient buspirone, Prozac and as needed Xanax    Plan of care discussed with patient and is in agreement as outlined, with verbalization of understanding and agreement    Disposition: Observation admission    GI Prophylaxis: None    DVT Prophylaxis: Heparin    CODE Status : FULL CODE    Admit to Renal Pulmonary vitals q 8 hrs SCD Intake and Output q shift O2 to maintain Sat 92% or greater Regular diet     MEDICATIONS: Fentanyl IV q 6 hrs PRN x 1 Zofran 4mg  IV q 4 hrs PRN x 1 Ambien 10mg  PO @ HS PRN x 1       05/02/2017    LABS: Chloride 112 Carbon Dioxide 19.9 Calcium 8.0 Osmolality 271 Blood Cultures: No Growth     VITALS: T98.1 P66 R18 BP99/61 Sat 100%  ASSESSMENT AND PLAN:  UTI with right sided pyelonephritis  Patient feels she is not getting better  She also concerned that she is immunocompromised and has had multiple UTI in the past   [place] iV ceftriaxone  Started on meropenem to cover ESBL bacteria   Urine culture in progress .  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  Patient gave oral consent for HIV test.  Test ordered and came back negative.        Hypokalemia from GI losses  Replace potassium  Check BMP in the morning     Crohn's disease in remission  Last dose of  Humira about 6 monthsago  No acute GI issues   Out pt GI follow up .    Hypothyroidism with   Low  TSH ; 0.34 with normal FT4 1.29.  Cont  levothyroxine     Depression with anxiety  Clinically stable mood  Cont  buspirone, Prozac and as needed Xanax.      DVT Prophylaxis: sq Heparin     Code Status: full code  .    Disposition: Change ceftriaxone to meropenem until after culture report is finalized before we can narrow down.  Continue to treat UTI covering ESBL producing bacteria in an immunocompromised patient.  Blood culture in progress.  Urine culture in progress    MEDICATIONS: Buspar 20mg  PO 2 times daily Prozac 40mg  PO Daily Heparin 5000units q 12 hrs Lactobacillus PO Daily Merrem 1000mg  IV q 8 hrs NS with KCL IV @ 45ml/hr Benadryl 25mg  IV q 6 hrs PRN x 4 Fentanyl IB q 4 hrs PRN x 3 Fentanyl q 6 hrs IV PR Nx 2 Toradol 15mg  IV q 6 hrs PRN x 1 Phenergan 25mg  q 6 hrs PRN TP x 1 Ambien 10mg  PO @ HS PRN x 1     05/03/2017    LABS: BUN 6 Sodium 134 Osmolality 264 Albumin 3.4     VITALS: T99.1 P80 R16 BP115/72 Sat 100%    ASSESSMENT AND PLAN:  UTI with right sided pyelonephritis improving since starting meropenem will continue 1 more day and switched to Keflex and sent home tomorrow on by mouth Keflex  She also concerned that she is immunocompromised and has had multiple UTI in the past   iV ceftriaxone was stopped yesterday  Started on meropenem to cover ESBL bacteria   Urine culture grew Klebsiella pneumonia.  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  Patient gave oral consent for HIV test.  Test ordered and came back negative.        Hypokalemia from GI losses  Replace potassium  Check BMP in the morning     Crohn's disease in remission  Last dose of  Humira about 6 monthsago  No acute GI issues   Out pt GI follow up .    Hypothyroidism with   Low  TSH ; 0.34 with normal FT4 1.29.  Cont  levothyroxine     Depression with anxiety  Clinically stable mood  Cont  buspirone, Prozac and as needed Xanax.      DVT Prophylaxis: sq Heparin     Code Status: full code .    Disposition:  Change ceftriaxone to meropenem until after culture report is finalized before we can narrow down.  Continue to treat UTI covering ESBL producing bacteria in an immunocompromised patient.  Blood culture pending  Urine culture Klebsiella pneumonia  Plan discussed with patient, nursing staff.    MEDICATIONS: Buspar 20mg  PO 2 times daily Prozac 40mg  PO Daily Lactobacillus PO Daily Merrem 1000mg  IV q 8 hrs NS with KCL IV @ 92ml/hr Benadryl 25mg  IV q 6 hrs PRN x 2 Fentanyl IV q 4 hrs PRN x 6 Toradol 15mg  IV q 6 hrs PRN x 1 Phenergan 25mg  q 6 hrs TP PRN x 1 Ambien 10mg  PO @ HS PRN x 1     DATE OF REVIEW: 05/04/2017    VITALS: BP 101/60   Pulse 76   Temp 98.8 F (37.1 C) (Oral)   Resp 18   Ht 1.549 m (5\' 1" )   Wt 48.6 kg (107 lb 1.6 oz)   LMP  (LMP Unknown)   SpO2 98%   BMI 20.24 kg/m     Active Hospital Problems    Diagnosis   . Pyelonephritis     LABS: BUN 6 Sodium 135 Calcium 8.4 Osmolality 266     MEDICATIONS: Buspar 20mg  PO 2 times daily Prozac 40mg  PO Daily Heparin 5000units q 12 hr Lactobacillus PO Daily Merrem 1000mg  IV q 8 hrs Benadryl 25mg  IV q 6 hrs PRN x 2 Fentanyl IV q 4 hrs PRN x 3 NORCO 1 tablet q 6 hrs PO PRN x 2     Delphia Kaylor L. Danella Penton RN BSN  [facility]  Utilization Review  [phone]  Fax [phone]

## 2017-05-04 NOTE — Progress Notes (Signed)
Date Time: 05/04/17 3:15 PM  Patient Name: [name], [name]       CSN: [id number]  Attending Physician: Alease Medina, MD  Primary Care Provider: Vivia Ewing, NP    Interim summary [facility] course)     31 y.o. F with medical history of Crohn's disease in remission for the past 6 months, off immunosuppressive therapy for the past 6 months, depression with anxiety, hypothyroidism, was admitted with fever , nausea and vomiting patient also described urinary symptoms of dysuria with suprapubic pain, increased frequency.Had a low-grade fever of 100.30F at home.   At ED  She had  fever 100.38F  and tachycardia at 154/min   Received IV fluids with improvement in tachycardia; sinus tachycardia on cardiac monitor  Negative CBC, CMP with exception of a mild hypokalemia of 3.2  Abnormally low  TSH of 0.34 with normal free T4 1.29  Normal lactic acid of 0.99  Abnormal urinalysis consistent with infection with urine culture Grew Klebsiella pneumonia    Abdominal ultrasound without any evidence of obstructive uropathy but showing Incidental finding of a dromedary hump.  Received IV fluids, IV antibiotics, antipyretics and antiemetics therapy in the ER    [place] abd on ;5.4.18  Abdominal aorta and inferior vena cava are normal in caliber. The pancreas is not well visualized due to overlying bowel gas.  The liver is normal in appearance. Liver length measures 13.6 cm.  The gallbladder is normal in appearance. There is no biliary tract dilatation with the common duct measuring 4 mm. There is no tenderness over the gallbladder.  The right kidney measures 8.7 cm in length. It is normal in appearance.  The left kidney measures 8.1 cm in length. It is normal in appearance.   Incidental finding of a dromedary hump.  The spleen is normal in appearance, with length of 8.7 cm.  No free fluid is identified.    SUBJECTIVE:     CC:   - Patient still complaining of right flank pain which seems to radiate  into the right groin area  - No high-grade fever still has poor appetite; no active nausea vomiting    PHYSICAL EXAM:   Temp:  [98.8 F (37.1 C)-99.3 F (37.4 C)] 98.8 F (37.1 C)  Heart Rate:  [76-77] 76  Resp Rate:  [18] 18  BP: (101-115)/(51-60) 101/60  Body mass index is 20.24 kg/m.    Intake/Output Summary (Last 24 hours) at 05/04/17 1515  Last data filed at 05/04/17 1300   Gross per 24 hour   Intake             1720 ml   Output             2750 ml   Net            -1030 ml     Weight Monitoring 12/31/2016 01/09/2017 01/10/2017 01/10/2017 01/11/2017 05/01/2017 05/01/2017   Height 154.9 cm 154.9 cm 154.9 cm - - - 154.9 cm   Height Method Stated Stated - - - - Stated   Weight 44.906 kg 44.453 kg 46.494 kg 46.494 kg 49.397 kg 47.7 kg 48.58 kg   Weight Method Stated Stated Bed Scale Bed Scale Bed Scale Actual Standing Scale   BMI (calculated) 18.7 kg/m2 18.6 kg/m2 19.4 kg/m2 - - - 20.3 kg/m2     Exam:   General:thin  built, no acute distress   Psychiatry: Patient is awake, alert and oriented x 3, mood is appropriate   Respt: CTA bilaterally. No  crackles or wheezing. No accessory muscle use   CVS: S1, S2 normal, RRR, no thrill   GI: soft and moderate tenderness in the right flank and right mid abdomen area; mild tenderness in the right lower quadrant; no rebound tenderness  No guarding or rigidity.  Normal bowel sound.  Musculoskeletal: No pitting edema, no cyanosis/clubbing.   Expected ROM all 4 extremities. No joint swelling     MEDS: (SCHEDULED/INFUSIONS/PRN)     Current Facility-Administered Medications   Medication Dose Route Frequency   . busPIRone  20 mg Oral BID   . FLUoxetine  40 mg Oral Daily   . heparin (porcine)  5,000 Units Subcutaneous Q12H SCH   . lactobacillus species  50 Billion CFU Oral Daily   . meropenem  1,000 mg Intravenous Q8H     Current Facility-Administered Medications   Medication Dose Route Frequency Last Rate   . 0.9 % NaCl with KCl 40 mEq   Intravenous Continuous 50 mL/hr (05/03/17 0950)          Current Facility-Administered Medications   Medication Dose Route   . acetaminophen  650 mg Oral    Or   . acetaminophen  650 mg per NG tube    Or   . acetaminophen  650 mg Rectal   . ALPRAZolam  0.5 mg Oral   . diphenhydrAMINE  25 mg Intravenous   . fentaNYL (PF)  25 mcg Intravenous   . HYDROcodone-acetaminophen  1 tablet Oral   . ketorolac  15 mg Intravenous   . naloxone  0.4 mg Intravenous   . ondansetron  8 mg Oral    Or   . ondansetron  4 mg Intravenous   . prochlorperazine  10 mg Intravenous   . promethazine  25 mg Topical   . zolpidem  10 mg Oral         LABS:       Recent Labs  Lab 05/03/17  0459 05/01/17  1826   WBC 6.3 9.1   RBC 4.28 4.58   Hemoglobin 12.3 13.1   Hematocrit 39.0 40.4   MCV 91 88   PLT CT 323 373         Recent Labs  Lab 05/04/17  0601 05/03/17  0459   Sodium 135* 134*   Potassium 3.9 4.6   Chloride 104 106   CO2 24.6 22.3   BUN 6* 6*   Creatinine 0.76 0.73   Glucose 76 78   EGFR 106 112   Calcium 8.4* 8.7         Recent Labs  Lab 05/03/17  0459 05/01/17  1826   ALT 11 13   AST (SGOT) 20 18   Bilirubin, Total 0.4 0.6   Albumin 3.4* 4.1   Alkaline Phosphatase 73 79        No results found for: [id number]      IMAGING STUDIES:   US Abdomen Complete    Result Date: 05/01/2017  Essentially normal abdominal ultrasound exam. Pancreas is not visualized due to bowel gas. ReadingStation:[id number]      MICROBIOLOGY AND/OR TELEMETRY:     Urine cx Klebsiella pneumonia  Blood culture pending    ASSESSMENT AND PLAN:      UTI with right sided pyelonephritis - Overall improving   - Still complaining of right-sided flank pain; occasional low-grade fever and poor appetite  - meropenem will continue 1 more day   - Obtain CT abdomen pelvis with by mouth and IV contrast  -  immunocompromised and has had multiple episodes of pyelonephritis in the past  - HIV screen is negative  - Urine culture grew Klebsiella pneumonia.  - Antiemetic therapy  - Antipyretic therapy - monitor for further fevers  - Pain  control    Hypokalemia from GI losses  Replace potassium  Check BMP in the morning     Crohn's disease in remission  Last dose of  Humira about 6 months ago  No acute GI issues   Out pt GI follow up .    Hypothyroidism with   Low  TSH ; 0.34 with normal FT4 1.29.  Cont  levothyroxine     Depression with anxiety  Clinically stable mood  Cont  buspirone, Prozac and as needed Xanax.    DVT Prophylaxis: sq Heparin     Code Status: full code .    Disposition: We'll get CT abdomen pelvis today and give 1 more day of IV antibiotics. Pending on follow-up results she could likely be discharged in a.m. tomorrow with oral antibiotics    Plan discussed with patient, nursing staff.    Signed by: Alease Medina, MD  [address]  [place]  Office Ph: [phone]    Please contact at phone number [phone] if any questions.

## 2017-05-04 NOTE — Progress Notes (Signed)
Assumed care of patient at 1900. No s/s of distress noted. Assessment complete. Call bell within reach, will continue to monitor.    2325: Patient stating she "doesn't feel right." States she feels like she has a fever, rectal temp 99.2. Patient requesting more pain medication, advised patient that attending provider has ordered PRN norco and toradol for in between doses of fentanyl, patient however wants a higher dose of fentanyl. Patient agreeable to try PRN norco or toradol between fentanyl doses. Will continue to monitor.

## 2017-05-04 NOTE — Plan of Care (Signed)
Problem: Pain interferes with ability to perform ADL  Goal: Pain at adequate level as identified by patient  Outcome: Progressing   05/04/17 1021   Goal/Interventions addressed this shift   Pain at adequate level as identified by patient Identify patient comfort function goal;Assess for risk of opioid induced respiratory depression, including snoring/sleep apnea. Alert healthcare team of risk factors identified.;Assess pain on admission, during daily assessment and/or before any "as needed" intervention(s);Reassess pain within 30-60 minutes of any procedure/intervention, per Pain Assessment, Intervention, Reassessment (AIR) Cycle;Offer non-pharmacological pain management interventions;Evaluate if patient comfort function goal is met;Evaluate patient's satisfaction with pain management progress;Include patient/patient care companion in decisions related to pain management as needed       Problem: Side Effects from Pain Analgesia  Goal: Patient will experience minimal side effects of analgesic therapy  Outcome: Progressing   05/03/17 0146   Goal/Interventions addressed this shift   Patient will experience minimal side effects of analgesic therapy Monitor/assess patient's respiratory status (RR depth, effort, breath sounds);Assess for changes in cognitive function;Prevent/manage side effects per LIP orders (i.e. nausea, vomiting, pruritus, constipation, urinary retention, etc.);Evaluate for opioid-induced sedation with appropriate assessment tool (i.e. POSS)       Problem: Moderate/High Fall Risk Score >5  Goal: Patient will remain free of falls  Outcome: Progressing   05/04/17 2325   OTHER   Moderate Risk (6-13) MOD-(VH Only) Yellow "Fall Risk" signage;MOD-(VH Only) Yellow slippers;MOD-(VH Only) Apply yellow "Fall Risk" arm band;MOD-Remain with patient during toileting;MOD-Consider activation of bed alarm if appropriate;MOD-Apply bed exit alarm if patient is confused       Problem: Bladder/Voiding  Goal: Free from  infection  Outcome: Progressing   05/04/17 0003   Goal/Interventions addressed this shift   Free from infection Monitor/assess for signs and symptoms of infection;Assess need for indwelling catheter every shift and discuss with LIP       Comments: Assumed care of pt at 1900. Fluids infusing per orders. PT continues to have 7/10 right lower quadrant pain after pain medication. Has been medicated for pain throughout shift. CT scan showed 2.4 cm right corpus luteal cyst. Friend at bedside. Pt has no other needs or concerns at this time. Will continue to monitor.

## 2017-05-05 LAB — CBC AND DIFFERENTIAL
Basophils %: 0.7 % (ref 0.0–3.0)
Basophils Absolute: 0 10*3/uL (ref 0.0–0.3)
Eosinophils %: 4.2 % (ref 0.0–7.0)
Eosinophils Absolute: 0.2 10*3/uL (ref 0.0–0.8)
Hematocrit: 40.4 % (ref 36.0–48.0)
Hemoglobin: 13 gm/dL (ref 12.0–16.0)
Lymphocytes Absolute: 2.2 10*3/uL (ref 0.6–5.1)
Lymphocytes: 38.2 % (ref 15.0–46.0)
MCH: 29 pg (ref 28–35)
MCHC: 32 gm/dL (ref 32–36)
MCV: 90 fL (ref 80–100)
MPV: 6.6 fL (ref 6.0–10.0)
Monocytes Absolute: 0.6 10*3/uL (ref 0.1–1.7)
Monocytes: 11 % (ref 3.0–15.0)
Neutrophils %: 45.9 % (ref 42.0–78.0)
Neutrophils Absolute: 2.6 10*3/uL (ref 1.7–8.6)
PLT CT: 333 10*3/uL (ref 130–440)
RBC: 4.48 10*6/uL (ref 3.80–5.00)
RDW: 13.4 % (ref 11.0–14.0)
WBC: 5.6 10*3/uL (ref 4.0–11.0)

## 2017-05-05 LAB — BASIC METABOLIC PANEL
Anion Gap: 11 mMol/L (ref 7.0–18.0)
BUN / Creatinine Ratio: 7.9 Ratio — ABNORMAL LOW (ref 10.0–30.0)
BUN: 6 mg/dL — ABNORMAL LOW (ref 7–22)
CO2: 25.1 mMol/L (ref 20.0–30.0)
Calcium: 9.1 mg/dL (ref 8.5–10.5)
Chloride: 102 mMol/L (ref 98–110)
Creatinine: 0.76 mg/dL (ref 0.60–1.20)
EGFR: 106 mL/min/{1.73_m2} (ref 60–150)
Glucose: 77 mg/dL (ref 71–99)
Osmolality Calc: 265 mOsm/kg — ABNORMAL LOW (ref 275–300)
Potassium: 4.1 mMol/L (ref 3.5–5.3)
Sodium: 134 mMol/L — ABNORMAL LOW (ref 136–147)

## 2017-05-05 LAB — PHOSPHORUS: Phosphorus: 2.8 mg/dL (ref 2.3–4.7)

## 2017-05-05 LAB — MAGNESIUM: Magnesium: 2.1 mg/dL (ref 1.6–2.6)

## 2017-05-05 MED ORDER — NALOXONE HCL 4 MG/0.1ML NA LIQD
NASAL | 0 refills | Status: DC
Start: 2017-05-05 — End: 2017-07-18

## 2017-05-05 MED ORDER — CEFUROXIME AXETIL 250 MG PO TABS
250.0000 mg | ORAL_TABLET | Freq: Two times a day (BID) | ORAL | 0 refills | Status: DC
Start: 2017-05-05 — End: 2017-07-18

## 2017-05-05 MED ORDER — VH BIO-K PLUS PROBIOTIC 50 BIL CFU CAPSULE
50.0000 | DELAYED_RELEASE_CAPSULE | Freq: Every day | ORAL | 0 refills | Status: DC
Start: 2017-05-06 — End: 2017-07-18

## 2017-05-05 MED ORDER — CEFUROXIME AXETIL 250 MG PO TABS
250.0000 mg | ORAL_TABLET | Freq: Two times a day (BID) | ORAL | Status: DC
Start: 2017-05-05 — End: 2017-05-05
  Administered 2017-05-05: 250 mg via ORAL
  Filled 2017-05-05 (×2): qty 1

## 2017-05-05 MED ORDER — OXYCODONE-ACETAMINOPHEN 5-325 MG PO TABS
1.0000 | ORAL_TABLET | Freq: Four times a day (QID) | ORAL | 0 refills | Status: AC | PRN
Start: 2017-05-05 — End: 2017-05-10

## 2017-05-05 NOTE — Discharge Instr - AVS First Page (Signed)
Bladder Infection,Female (Adult)    Urine is normally doesn't have any bacteria in it. But bacteria can get into the urinary tract from the skin around the rectum. Or they can travel in the blood from elsewhere in the body. Once they are in your urinary tract, they can cause infection in the urethra (urethritis), the bladder (cystitis), or the kidneys (pyelonephritis).  The most common place for an infection is in the bladder. This is called a bladder infection. This is one of the most common infections in women. Most bladder infections are easily treated. They are not serious unless the infection spreads to the kidney.  The phrases "bladder infection," "UTI," and "cystitis" are often used to describe the same thing. But they are not always the same. Cystitis is an inflammation of the bladder. Themost common cause of cystitis is an infection.  Symptoms  The infection causes inflammation in the urethra and bladder. This causes many of the symptoms. The most common symptoms of a bladder infection are:   Pain or burning when urinating   Having to urinate more often than usual   Urgent need to urinate   Only a small amount of urine comes out   Blood in urine   Abdominal discomfort. This is usually in the lower abdomen above the pubic bone.   Cloudy urine   Strong- or bad-smelling urine   Unable to urinate (urinary retention)   Unable to hold urine in (urinary incontinence)   Fever   Loss of appetite   Confusion (in older adults)  Causes  Bladder infections are not contagious. You can't get one from someone else, from a toilet seat, or from sharing a bath.  The most common cause of bladder infections is bacteria from the bowels. The bacteria get onto the skin around the opening of the urethra. From there, they can get into the urine and travel up to the bladder, causing inflammation and infection. This usually happens because of:   Wiping improperly after urinating. Always wipe from front to  back.   Bowel incontinence   Pregnancy   Procedures such as having a catheter inserted   Older age   Not emptying your bladder. This can allow bacteria a chance to grow in your urine.   Dehydration   Constipation   Sex   Use of a diaphragm for birth control  Treatment  Bladder infections are diagnosed by a urine test. They are treated with antibiotics and usuallyclear up quickly without complications. Treatment helps prevent a more serious kidney infection.  Medicines  Medicines can help in the treatment of a bladder infection:   Take antibiotics until they are used up, even if you feel better. It is important to finish them to make sure the infection has cleared.   You can use acetaminophen or ibuprofen for pain, fever, or discomfort, unless another medicine was prescribed. If you have chronic liver or kidney disease, talk with your healthcareprovider before usingthese medicines. Also talk with your provider if you've ever had a stomach ulcer or gastrointestinal bleeding, or are taking blood-thinner medicines.   If you are givenphenazopydridine to reduce burning with urination, it will cause your urine to become a bright orange color. This can stain clothing.  Care and prevention  These self-care steps can help prevent future infections:   Drink plenty of fluids to prevent dehydration and flush out your bladder. Do thisunless you must restrict fluids for other health reasons, or your doctor told you not to.     Proper cleaning after going to the bathroom is important. Wipe from front to back after using the toilet to prevent the spread of bacteria.   Urinate more often. Don't try to hold urine in for a long time.   Wear loose-fitting clothes and cotton underwear. Avoid tight-fitting pants.   Improve your diet and prevent constipation. Eat more fresh fruit and vegetables, andfiber, and less junk and fatty foods.   Avoid sex until your symptoms are gone.   Avoid caffeine, alcohol, and spicy  foods. These can irritate your bladder.   Urinate right after intercourse to flush out your bladder.   If you use birth control pills and have frequent bladder infections, discuss it with your doctor.  Follow-up care  Call your healthcare provider if all symptoms are not gone after 3 days of treatment. This is especially important if you have repeat infections.  If a culture was done, you will be told if your treatment needs to be changed. If directed, you can callto find out the results.  If X-rays were done, you will be told if the results will affect yourtreatment.  Call 911  Call 911 if any of the following occur:   Trouble breathing   Hard to wake up orconfusion   Fainting or loss of consciousness   Rapid heart rate  When to seek medical advice  Call your healthcare provider right away if any of these occur:   Fever of 100.4F (38.0C) or higher, or as directed by your healthcare provider   Symptoms are not betterby the third day of treatment   Back or belly (abdominal) pain that gets worse   Repeated vomiting, or unable to keep medicine down   Weakness or dizziness   Vaginal discharge   Pain, redness, or swelling in the outer vaginal area (labia)  Date Last Reviewed: 09/29/2015   2000-2016 [employer] [address], [place] [address]. All rights reserved. This information is not intended as a substitute for professional medical care. Always follow your healthcare professional's instructions.

## 2017-05-05 NOTE — Progress Notes (Signed)
05/05/17 1217   Case Management Quick Doc   Case Management Assessment Status Assessment Complete   CM Comments 5.8-RNCM: UTI with right sided pyelonephritis-stable for [place] today per attending on PO abx. Patient is independent, drives, insured, plans  home with family support.    Physical Discharge Disposition Home, No Needs   Suzan Slick. Dorthy Cooler BSN, RN  Geophysicist/field seismologist. [phone]

## 2017-05-05 NOTE — Discharge Summary (Signed)
[facility]      Patient: [name]  Admission Date: 05/01/2017   DOB: September 03, 1986  Discharge Date: 05/05/2017    MRN: [id number]  Discharge Attending:  Alease Medina, MD   Referring Physician: Vivia Ewing, NP  PCP: Vivia Ewing, NP       DISCHARGE SUMMARY     Discharge Information   Discharge Diagnoses:  1. Acute pyelonephritis  2. UTI with Klebsiella pneumonia  3. History of Crohn's disease in remission  4. Hypothyroidism  5. History of recent immunosuppressive therapy use  6. History of depression and anxiety    Discharge Medications:     Medication List      START taking these medications    cefuroxime 250 MG tablet  Commonly known as:  CEFTIN  Take 1 tablet (250 mg total) by mouth every 12 (twelve) hours.     lactobacillus species capsule  Commonly known as:  BIO-K PLUS  Take 1 capsule (50 Billion CFU total) by mouth daily.  Start taking on:  05/06/2017     naloxone 4 MG/0.1ML nasal spray  Commonly known as:  NARCAN  1 spray intranasally. If pt does not respond or relapses into respiratory depression call 911. Give additional doses every 2-3 min.     oxyCODONE-acetaminophen 5-325 MG per tablet  Commonly known as:  PERCOCET  Take 1 tablet by mouth every 6 (six) hours as needed for Pain.for up to 5 days        CONTINUE taking these medications    ALPRAZolam 0.5 MG tablet  Commonly known as:  XANAX     busPIRone 10 MG tablet  Commonly known as:  BUSPAR     FLUoxetine 40 MG capsule  Commonly known as:  PROzac     ibuprofen 200 MG tablet  Commonly known as:  ADVIL,MOTRIN     levothyroxine 88 MCG tablet  Commonly known as:  SYNTHROID, LEVOTHROID     zolpidem 10 MG tablet  Commonly known as:  AMBIEN           Where to Get Your Medications      You can get these medications from any pharmacy    Bring a paper prescription for each of these medications   cefuroxime 250 MG tablet   lactobacillus species capsule   naloxone 4  MG/0.1ML nasal spray   oxyCODONE-acetaminophen 5-325 MG per tablet           Hospital Course   History of Present Illness and Hospital Course (0 Days)     [name] is a 31 y.o. female with known history of Crohn's disease in remission who was admitted with fever nausea vomiting along with dysuria and suprapubic pain and frequency.   She was febrile and tachycardic in the ER and received IV fluids and IV antibiotics was started. She was admitted for acute pyelonephritis and dehydration. Urine cultures grew Klebsiella pneumonia which was sensitive to most antibiotics. Patient had abdominal ultrasound which did not show any evidence of obstructive uropathy.   Gen. Still complaining of right flank pain as well as right groin area pain after 3 days of IV antibiotics and therefore CT abdomen was done. CT abdomen did not show any abnormalities apart from a right corpus luteum cyst. Patient hemogram was stable no evidence of crepitus cyst rupture and imaging.   At this time patient is feeling much better has had no fever over the past 3 days she will be discharged on oral antibiotics  to complete a 14 day course of treatment. I have advised her to follow up with her local urologist because of recurrent urinary tract infections and pyelonephritis       Procedures/Imaging   CT Abdomen Pelvis W IV And PO Cont   Final Result      1. No acute findings in the abdomen or pelvis.   2. 2.4 cm right corpus luteal cyst.      ReadingStation:[email or web address]      US Abdomen Complete   Final Result   Essentially normal abdominal ultrasound exam. Pancreas is not visualized due to bowel gas.      ReadingStation:[email or web address]        Treatment Team:   Attending Provider: Alease Medina, MD         Progress Note/Physical Exam at Discharge     Subjective: patient feeling much better; right flank pain is controlled with pain medications; tolerating diet no nausea vomiting     Vitals:    05/04/17 0755 05/04/17 1602 05/04/17 2300 05/05/17 0918      BP: 101/60 117/80 97/58 113/63   Pulse: 76 81 88 80   Resp: 18 18 16 18    Temp: 98.8 F (37.1 C) 98.5 F (36.9 C) 98.3 F (36.8 C) 98.9 F (37.2 C)   TempSrc: Oral Oral Oral Oral   SpO2: 98% 100% 100% 100%   Weight:       Height:           General Appearance: No apparent distress.   Eyes: no scleral icterus or conjunctival pallor  HEENT: no oropharyngeal lesions,no lymphadenopathy  Cardiovascular: RRR, S1 S2 normal, no murmurs  Respiratory: CTA bilateral; no wheezing/crackles/ronchi  Gastrointestinal: Soft, non-tender, +BS, no hepatosplenomegaly  Skin: no rashes, jaundice or ulcers  Neurologic: no gross motor or sensory deficits   Psychiatric: alert and oriented, normal affect       Diagnostics     Labs/Studies Pending at Discharge: No    Last Labs     Recent Labs  Lab 05/05/17  0833 05/03/17  0459 05/01/17  1826   WBC 5.6 6.3 9.1   RBC 4.48 4.28 4.58   Hemoglobin 13.0 12.3 13.1   Hematocrit 40.4 39.0 40.4   MCV 90 91 88   PLT CT 333 323 373         Recent Labs  Lab 05/05/17  0833 05/04/17  0601 05/03/17  0459 05/02/17  0733 05/01/17  1826   Sodium 134* 135* 134* 137 138   Potassium 4.1 3.9 4.6 4.2 3.2*   Chloride 102 104 106 112* 106   CO2 25.1 24.6 22.3 19.9* 20.7   BUN 6* 6* 6* 8 9   Creatinine 0.76 0.76 0.73 0.73 0.85   Glucose 77 76 78 71 95   Calcium 9.1 8.4* 8.7 8.0* 9.4   Magnesium 2.1  --   --   --   --        Microbiology Results     Procedure Component Value Units Date/Time    Blood Culture - Venipuncture [[id number]] Collected:  05/02/17 1232    Specimen:  Blood from Venipuncture Updated:  05/04/17 0717    Narrative:       Specimen/Source: Blood/Venipuncture  Collected: 05/02/2017 12:32     Status: Valued      Last Updated: 05/04/2017 07:15           (1) *Specimen Blood      *Source Detail Venipuncture  Culture Result (Prelim)      No Growth To Date          Urine Culture [[id number]] Collected:  05/01/17 1821    Specimen:  Urine from Clean Catch Updated:  05/03/17 0643    Narrative:        Specimen/Source: Urine Specimens/Clean Catch  Collected: 05/01/2017 18:21     Status: Final      Last Updated: 05/03/2017 06:43           (1) *Specimen Urine      *Source Detail Clean Catch             ISO #1 (Final)      Greater than 100,000 CFU/mL      Klebsiella pneumoniae                               ISO #1                            Klebsiella pneumoniae                            ---------------------    MIC (mcg/ml)      Ampicillin/Sulbactam  4                S      (A/S)      Cefazolin (CFZ)       <=4              S      Ciprofloxacin (CP)    <=0.25           S      Gentamicin (GM)       <=1              S      Levofloxacin (LEV)    <=0.12           S      Nitrofurantoin (FD)   64               I      Pip/Tazo (PI/T)       <=4              S      Tobramycin (TO)       <=1              S      Trimeth/Sulfa (T/S)   <=20             S                   Patient Instructions   Discharge Diet: regular diet  Discharge Activity:  activity as tolerated and no lifting, Driving, or Strenuous exercise for 5 days    Follow Up Appointment:  Follow-up Information     Schutter, Elenore Paddy, NP. Schedule an appointment as soon as possible for a visit in 1 week(s).    Specialty:  Nurse Practitioner  Why:  May need referral to Urology  Contact information:  [address]  [place] [address]  [phone]                    Time spent examining patient, discussing with patient/family regarding hospital course, chart review, reconciling medications and discharge planning: 30 minutes.  Carman Ching, MD  05/05/2017 10:48 AM

## 2017-05-05 NOTE — Progress Notes (Addendum)
Discharge instructions reviewed with patient, all questions and concerns addressed by this nurse,patient was given copy of discharge instructions along with prescriptions for cefuroxime, lactobacillus, naloxone, and oxycodone.  Patient verbalized understanding of discharge instructions.

## 2017-07-18 ENCOUNTER — Emergency Department: Payer: Commercial Managed Care - PPO

## 2017-07-18 ENCOUNTER — Emergency Department
Admission: EM | Admit: 2017-07-18 | Discharge: 2017-07-18 | Disposition: A | Discharge status: Home / Self Care | Payer: Commercial Managed Care - PPO | Attending: Emergency Medicine | Admitting: Emergency Medicine

## 2017-07-18 DIAGNOSIS — R1030 Lower abdominal pain, unspecified: Secondary | ICD-10-CM

## 2017-07-18 DIAGNOSIS — R1031 Right lower quadrant pain: Secondary | ICD-10-CM | POA: Insufficient documentation

## 2017-07-18 DIAGNOSIS — N390 Urinary tract infection, site not specified: Secondary | ICD-10-CM

## 2017-07-18 DIAGNOSIS — B9689 Other specified bacterial agents as the cause of diseases classified elsewhere: Secondary | ICD-10-CM | POA: Insufficient documentation

## 2017-07-18 LAB — CBC AND DIFFERENTIAL
Basophils %: 0.5 % (ref 0.0–3.0)
Basophils Absolute: 0.1 10*3/uL (ref 0.0–0.3)
Eosinophils %: 0.2 % (ref 0.0–7.0)
Eosinophils Absolute: 0 10*3/uL (ref 0.0–0.8)
Hematocrit: 40.7 % (ref 36.0–48.0)
Hemoglobin: 13.1 gm/dL (ref 12.0–16.0)
Lymphocytes Absolute: 1.9 10*3/uL (ref 0.6–5.1)
Lymphocytes: 16.9 % (ref 15.0–46.0)
MCH: 29 pg (ref 28–35)
MCHC: 32 gm/dL (ref 32–36)
MCV: 91 fL (ref 80–100)
MPV: 6.8 fL (ref 6.0–10.0)
Monocytes Absolute: 0.8 10*3/uL (ref 0.1–1.7)
Monocytes: 7.3 % (ref 3.0–15.0)
Neutrophils %: 75.1 % (ref 42.0–78.0)
Neutrophils Absolute: 8.3 10*3/uL (ref 1.7–8.6)
PLT CT: 451 10*3/uL — ABNORMAL HIGH (ref 130–440)
RBC: 4.47 10*6/uL (ref 3.80–5.00)
RDW: 13.4 % (ref 11.0–14.0)
WBC: 11 10*3/uL (ref 4.0–11.0)

## 2017-07-18 LAB — VH URINALYSIS WITH MICROSCOPIC AND CULTURE IF INDICATED
Bilirubin, UA: NEGATIVE
Blood, UA: NEGATIVE
Glucose, UA: NEGATIVE mg/dL
Ketones UA: 80 mg/dL — AB
Leukocyte Esterase, UA: 25 Leu/uL — AB
Nitrite, UA: NEGATIVE
Protein, UR: 30 mg/dL — AB
RBC, UA: 2 /hpf (ref 0–5)
Squam Epithel, UA: <1 /hpf (ref 0–2)
Urine Specific Gravity: 1.023 (ref 1.001–1.040)
Urobilinogen, UA: 2 mg/dL — AB
WBC, UA: 15 /hpf — ABNORMAL HIGH (ref 0–4)
pH, Urine: 6 pH (ref 5.0–8.0)

## 2017-07-18 LAB — COMPREHENSIVE METABOLIC PANEL
ALT: 13 U/L (ref 0–55)
AST (SGOT): 28 U/L (ref 10–42)
Albumin/Globulin Ratio: 1.07 Ratio (ref 0.70–1.50)
Albumin: 4.4 gm/dL (ref 3.5–5.0)
Alkaline Phosphatase: 97 U/L (ref 40–145)
Anion Gap: 20.8 mMol/L — ABNORMAL HIGH (ref 7.0–18.0)
BUN / Creatinine Ratio: 14.5 Ratio (ref 10.0–30.0)
BUN: 11 mg/dL (ref 7–22)
Bilirubin, Total: 0.8 mg/dL (ref 0.1–1.2)
CO2: 20.4 mMol/L (ref 20.0–30.0)
Calcium: 9.9 mg/dL (ref 8.5–10.5)
Chloride: 102 mMol/L (ref 98–110)
Creatinine: 0.76 mg/dL (ref 0.60–1.20)
EGFR: 106 mL/min/{1.73_m2} (ref 60–150)
Globulin: 4.1 gm/dL — ABNORMAL HIGH (ref 2.0–4.0)
Glucose: 86 mg/dL (ref 71–99)
Osmolality Calc: 276 mOsm/kg (ref 275–300)
Potassium: 4.2 mMol/L (ref 3.5–5.3)
Protein, Total: 8.5 gm/dL — ABNORMAL HIGH (ref 6.0–8.3)
Sodium: 139 mMol/L (ref 136–147)

## 2017-07-18 LAB — HCG, SERUM, QUALITATIVE: BHCG Qualitative: NEGATIVE

## 2017-07-18 LAB — C-REACTIVE PROTEIN: C-Reactive Protein: 0.22 mg/dL (ref 0.02–0.80)

## 2017-07-18 LAB — LIPASE: Lipase: 4 U/L — ABNORMAL LOW (ref 8–78)

## 2017-07-18 MED ORDER — IOHEXOL 240 MG/ML IJ SOLN
50.0000 mL | Freq: Once | INTRAMUSCULAR | Status: AC
Start: 2017-07-18 — End: 2017-07-18
  Administered 2017-07-18: 50 mL via ORAL

## 2017-07-18 MED ORDER — LORAZEPAM 2 MG/ML IJ SOLN
1.0000 mg | Freq: Once | INTRAMUSCULAR | Status: AC
Start: 2017-07-18 — End: 2017-07-18
  Administered 2017-07-18: 1 mg via INTRAVENOUS

## 2017-07-18 MED ORDER — SODIUM CHLORIDE 0.9 % IV BOLUS
1000.0000 mL | Freq: Once | INTRAVENOUS | Status: AC
Start: 2017-07-18 — End: 2017-07-18
  Administered 2017-07-18: 1000 mL via INTRAVENOUS

## 2017-07-18 MED ORDER — MORPHINE SULFATE 4 MG/ML IJ/IV SOLN (WRAP)
Status: AC
Start: 2017-07-18 — End: ?
  Filled 2017-07-18: qty 1

## 2017-07-18 MED ORDER — CEPHALEXIN 500 MG PO CAPS
500.0000 mg | ORAL_CAPSULE | Freq: Once | ORAL | Status: AC
Start: 2017-07-18 — End: 2017-07-18
  Administered 2017-07-18: 500 mg via ORAL

## 2017-07-18 MED ORDER — ONDANSETRON HCL 4 MG/2ML IJ SOLN
INTRAMUSCULAR | Status: AC
Start: 2017-07-18 — End: ?
  Filled 2017-07-18: qty 2

## 2017-07-18 MED ORDER — IOHEXOL 350 MG/ML IV SOLN
100.0000 mL | Freq: Once | INTRAVENOUS | Status: AC | PRN
Start: 2017-07-18 — End: 2017-07-18
  Administered 2017-07-18: 100 mL via INTRAVENOUS

## 2017-07-18 MED ORDER — SODIUM CHLORIDE 0.9 % IJ SOLN
10.0000 mL | INTRAMUSCULAR | Status: DC | PRN
Start: 2017-07-18 — End: 2017-07-18

## 2017-07-18 MED ORDER — PROMETHAZINE HCL 25 MG/ML IJ SOLN
12.5000 mg | Freq: Once | INTRAMUSCULAR | Status: DC
Start: 2017-07-18 — End: 2017-07-18

## 2017-07-18 MED ORDER — MORPHINE SULFATE 4 MG/ML IJ/IV SOLN (WRAP)
4.0000 mg | Freq: Once | Status: AC
Start: 2017-07-18 — End: 2017-07-18
  Administered 2017-07-18: 4 mg via INTRAVENOUS

## 2017-07-18 MED ORDER — ONDANSETRON HCL 8 MG PO TABS
8.0000 mg | ORAL_TABLET | Freq: Four times a day (QID) | ORAL | 0 refills | Status: DC | PRN
Start: 2017-07-18 — End: 2017-08-12

## 2017-07-18 MED ORDER — PROMETHAZINE HCL 25 MG/ML IJ SOLN
INTRAMUSCULAR | Status: AC
Start: 2017-07-18 — End: ?
  Filled 2017-07-18: qty 1

## 2017-07-18 MED ORDER — LORAZEPAM 2 MG/ML IJ SOLN
INTRAMUSCULAR | Status: AC
Start: 2017-07-18 — End: ?
  Filled 2017-07-18: qty 1

## 2017-07-18 MED ORDER — CEPHALEXIN 500 MG PO CAPS
500.0000 mg | ORAL_CAPSULE | Freq: Two times a day (BID) | ORAL | 0 refills | Status: AC
Start: 2017-07-18 — End: 2017-07-25

## 2017-07-18 MED ORDER — PROMETHAZINE HCL 25 MG/ML IJ SOLN
12.5000 mg | Freq: Once | INTRAMUSCULAR | Status: AC
Start: 2017-07-18 — End: 2017-07-18
  Administered 2017-07-18: 12.5 mg via INTRAVENOUS

## 2017-07-18 MED ORDER — SODIUM CHLORIDE 0.9 % IJ SOLN
10.0000 mL | Freq: Every day | INTRAMUSCULAR | Status: DC
Start: 2017-07-18 — End: 2017-07-18
  Administered 2017-07-18: 10 mL

## 2017-07-18 MED ORDER — ONDANSETRON HCL 4 MG/2ML IJ SOLN
4.0000 mg | Freq: Once | INTRAMUSCULAR | Status: AC
Start: 2017-07-18 — End: 2017-07-18
  Administered 2017-07-18: 4 mg via INTRAVENOUS

## 2017-07-18 MED ORDER — CEPHALEXIN 500 MG PO CAPS
ORAL_CAPSULE | ORAL | Status: AC
Start: 2017-07-18 — End: ?
  Filled 2017-07-18: qty 1

## 2017-07-18 NOTE — ED Notes (Signed)
Debbie, VAS, returned call at this time and was notified of pt need for IV access

## 2017-07-18 NOTE — ED Notes (Signed)
[name], EDT, attempted IV x2. Pt c/o burning before tech even flushed IV and insisted IV be removed. Pt states they normally use an U/S machine to get an IV in her [facility]. Pt needs better IV site for abdominal CT with contrast. VAS called at this time. Waiting for return call

## 2017-07-18 NOTE — ED Notes (Signed)
Debbie, VAS, in room at this time

## 2017-07-18 NOTE — ED Notes (Signed)
Report given to [facility], [employer] fund[employer], at this time

## 2017-07-18 NOTE — ED Notes (Addendum)
Attempted an IV in RAC 20g - patient stated that it was stinging and wanted it taken out. LAC 20g AC flushed great as well pt stated that it was stinging and wanted it out. -kj

## 2017-07-18 NOTE — ED Triage Notes (Signed)
Pt c/o diffuse lower abdominal pain and n/v since last night. Pt states she was lying in bed when the pain came on all of a sudden. +hx Crohn's disease. Pt states she had a little burning with urination. Denies diarrhea.

## 2017-07-18 NOTE — Discharge Instructions (Signed)
Please take antibiotic as prescribed. Please also take a probiotic as directed over the counter. Please take ibuprofen 600 mg every 4-6 hours and/or tylenol 500 mg every 6-8 hours as needed for pain. Take zofran as prescribed for nausea. Return to the ER for worsening abdominal pain, fever, vomiting despite medication, or any other concerns.     Unknown Causes of Abdominal Pain(Female)    The exact cause of your abdominal (stomach) pain is not clear. This does not mean that this is something to worry about. Everyone likes to know the exact cause of the problem, but sometimes with abdominal pain, there is no clear-cut cause, and this could be a good thing. The good news is that your symptoms can be treated, and you will feel better.  Your condition does not seem serious now; however, sometimes the signs of a serious problem may take more time to appear. For this reason,it is important for you to watch for any new symptoms, problems,or worsening of your condition.  Over the next few days, the abdominal pain may come and go, or be continuous. Other common symptoms can include nausea and vomiting. Sometimes it can be difficult to tell if you feel nauseous, you may just feel bad and not associate that feeling with nausea. Constipation, diarrhea, and a fever may go along with the pain.  The pain may continue even if treated correctly over the following days. Depending on how things go, sometimes the cause can become clear and may require further or different treatment. Additional evaluations, medications, or tests may also be needed.  Home care  Your healthcare provider may prescribe medicine for pain, symptoms, or an infection. Follow the healthcare provider's instructions for taking these medicines.  General care   Rest as much as you can until your next exam. No strenuous activities.   Try to find positions that ease discomfort. A small pillow placed on the abdomen may help relieve pain.   Something warm on  your abdomen (such as a heating pad) may help, but be careful not to burn yourself.  Diet   Do not force yourself to eat, especially if having cramps, vomiting, or diarrhea.   Water is important so you do not get dehydrated. Soup may also be good. Sports drinks may also help, especially if they are not too acidic. Make sure you don't drink sugary drinks as this can make things worse. Take liquids in small amounts. Do not guzzle them.   Caffeine sometimes makes the pain and cramping worse.   Avoid dairy products if you have vomiting or diarrhea.   Don't eat large amounts at a time. Wait a few minutes between bites.   Eat a diet low in fiber (called a low-residue diet). Foods allowed include refined breads, white rice, fruit and vegetable juices without pulp, tender meats. These foods will pass more easily through the intestine.   Avoid whole-grain foods, whole fruits and vegetables, meats, seeds and nuts, fried or fatty foods, dairy, alcohol and spicy foods until your symptoms go away.  Follow-up care  Follow up with your healthcare provider, or as advised, if your pain does not begin to improve in the next 24 hours.  Call 911  [phone] if any of these occur:   Trouble breathing   Confusion   Fainting or loss of consciousness   Rapid heart rate   Seizure  When to seek medical advice  Call your healthcare provider right away if any of these occur:   Pain  gets worse or moves to the right lower abdomen   New or worsening vomiting or diarrhea   Swelling of the abdomen   Unable to pass stool for more than3 days   Fever of 100.92F (38C) or higher, or as directed by your healthcare provider.   Blood in vomit or bowel movements (dark red or black color)   Jaundice (yellow color of eyes and skin)   Weakness, dizziness   Chest, arm, back, neck or jaw pain   Unexpected vaginal bleeding or missed period   Can't keep down liquids or water and are getting dehydrated  Date Last Reviewed: 12/27/2014    2000-2016 [employer] [address], [place], [place] [address]. All rights reserved. This information is not intended as a substitute for professional medical care. Always follow your healthcare professional's instructions.          Bladder Infection,Female (Adult)    Urine is normally doesn't have any bacteria in it. But bacteria can get into the urinary tract from the skin around the rectum. Or they can travel in the blood from elsewhere in the body. Once they are in your urinary tract, they can cause infection in the urethra (urethritis), the bladder (cystitis), or the kidneys (pyelonephritis).  The most common place for an infection is in the bladder. This is called a bladder infection. This is one of the most common infections in women. Most bladder infections are easily treated. They are not serious unless the infection spreads to the kidney.  The phrases "bladder infection," "UTI," and "cystitis" are often used to describe the same thing. But they are not always the same. Cystitis is an inflammation of the bladder. Themost common cause of cystitis is an infection.  Symptoms  The infection causes inflammation in the urethra and bladder. This causes many of the symptoms. The most common symptoms of a bladder infection are:   Pain or burning when urinating   Having to urinate more often than usual   Urgent need to urinate   Only a small amount of urine comes out   Blood in urine   Abdominal discomfort. This is usually in the lower abdomen above the pubic bone.   Cloudy urine   Strong- or bad-smelling urine   Unable to urinate (urinary retention)   Unable to hold urine in (urinary incontinence)   Fever   Loss of appetite   Confusion (in older adults)  Causes  Bladder infections are not contagious. You can't get one from someone else, from a toilet seat, or from sharing a bath.  The most common cause of bladder infections is bacteria from the bowels. The bacteria get onto the skin  around the opening of the urethra. From there, they can get into the urine and travel up to the bladder, causing inflammation and infection. This usually happens because of:   Wiping improperly after urinating. Always wipe from front to back.   Bowel incontinence   Pregnancy   Procedures such as having a catheter inserted   Older age   Not emptying your bladder. This can allow bacteria a chance to grow in your urine.   Dehydration   Constipation   Sex   Use of a diaphragm for birth control  Treatment  Bladder infections are diagnosed by a urine test. They are treated with antibiotics and usuallyclear up quickly without complications. Treatment helps prevent a more serious kidney infection.  Medicines  Medicines can help in the treatment of a bladder  infection:   Take antibiotics until they are used up, even if you feel better. It is important to finish them to make sure the infection has cleared.   You can use acetaminophen or ibuprofen for pain, fever, or discomfort, unless another medicine was prescribed. If you have chronic liver or kidney disease, talk with your healthcareprovider before usingthese medicines. Also talk with your provider if you've ever had a stomach ulcer or gastrointestinal bleeding, or are taking blood-thinner medicines.   If you are givenphenazopydridine to reduce burning with urination, it will cause your urine to become a bright orange color. This can stain clothing.  Care and prevention  These self-care steps can help prevent future infections:   Drink plenty of fluids to prevent dehydration and flush out your bladder. Do thisunless you must restrict fluids for other health reasons, or your doctor told you not to.   Proper cleaning after going to the bathroom is important. Wipe from front to back after using the toilet to prevent the spread of bacteria.   Urinate more often. Don't try to hold urine in for a long time.   Wear loose-fitting clothes and cotton  underwear. Avoid tight-fitting pants.   Improve your diet and prevent constipation. Eat more fresh fruit and vegetables, andfiber, and less junk and fatty foods.   Avoid sex until your symptoms are gone.   Avoid caffeine, alcohol, and spicy foods. These can irritate your bladder.   Urinate right after intercourse to flush out your bladder.   If you use birth control pills and have frequent bladder infections, discuss it with your doctor.  Follow-up care  Call your healthcare provider if all symptoms are not gone after 3 days of treatment. This is especially important if you have repeat infections.  If a culture was done, you will be told if your treatment needs to be changed. If directed, you can callto find out the results.  If X-rays were done, you will be told if the results will affect yourtreatment.  Call 911  Call 911 if any of the following occur:   Trouble breathing   Hard to wake up orconfusion   Fainting or loss of consciousness   Rapid heart rate  When to seek medical advice  Call your healthcare provider right away if any of these occur:   Fever of 100.51F (38.0C) or higher, or as directed by your healthcare provider   Symptoms are not betterby the third day of treatment   Back or belly (abdominal) pain that gets worse   Repeated vomiting, or unable to keep medicine down   Weakness or dizziness   Vaginal discharge   Pain, redness, or swelling in the outer vaginal area (labia)  Date Last Reviewed: 09/29/2015   2000-2016 [employer] [address] [place], [place] [address]. All rights reserved. This information is not intended as a substitute for professional medical care. Always follow your healthcare professional's instructions.

## 2017-07-18 NOTE — ED Provider Notes (Signed)
[facility]  Emergency Department       Patient Name: [name], [name] Patient DOB:  10-08-1986   Encounter Date:  07/18/2017 Age: 31 y.o. female   Attending ED Physician: Fara Olden, MD MRN:  [id number]   First Contact:   Physician/Midlevel provider first contact with patient: 07/18/17 1509      PCP: Vivia Ewing, NP      History of Presenting Illness:   Chief Complaint:abdominal pain     Location: lower abdomen   Quality: dull   Severity: Moderate  Onset: Acute  Duration: 1 Days Prior to Arrival  Modifying Factors: None    Context:    [name] is a 31 y.o. female with a past medical history significant for crohns disease not currently medication, pyelonephritis, and ovarian cysts who presents to the emergency department for Abdominal pain which started in her lower abdomen since last night, points suprapubic and right lower quadrant, is intermittent and somewhat in waves, radiates to her back, associated with nausea and 2 episodes of vomiting. Denies vaginal bleeding or discharge, admits to mild dysuria.        Review of Systems   Review of Systems   Constitutional: Negative for fever.   HENT: Negative for congestion.    Eyes: Negative for blurred vision.   Respiratory: Negative for cough.    Cardiovascular: Negative for leg swelling.   Gastrointestinal: Negative for diarrhea.   Genitourinary: Negative for dysuria.   Musculoskeletal: Negative for myalgias.   Skin: Negative for rash.   Neurological: Negative for dizziness.        Other pertinent review of systems findings in history of present illness.     Nursing Note:   I personally reviewed Nursing Notes.    There are no exam notes on file for this visit.          Allergies / Medications:   I personally reviewed Allergies    Pt is allergic to bentyl [dicyclomine]; bactrim [sulfamethoxazole w/trimethoprim (co-trimoxazole)]; and sulfa antibiotics.    Discharge Medication List as of 07/18/2017  7:16 PM      CONTINUE these  medications which have NOT CHANGED    Details   FLUoxetine (PROZAC) 40 MG capsule Take 40 mg by mouth daily., Starting Wed 04/08/2017, Historical Med      levothyroxine (SYNTHROID, LEVOTHROID) 88 MCG tablet Take 88 mcg by mouth daily., Until Discontinued, Historical Med      ibuprofen (ADVIL,MOTRIN) 200 MG tablet Take 400 mg by mouth every 6 (six) hours as needed for Pain., Historical Med               Past History:   I personally reviewed past history    Medical: Pt has a past medical history of Abortion; Anxiety; Crohn's disease; Depression; H/O Hashimoto thyroiditis; PCOS (polycystic ovarian syndrome); and Tachycardia.    Surgical: Pt  has a past surgical history that includes Breast Implant; Induced abortion; D & C, SUCTION (N/A, 08/24/2014); and Colonoscopy.    Family: The family history is not on file.    Social: Pt reports that she has never smoked. She has never used smokeless tobacco. She reports that she does not drink alcohol or use drugs.     Vital Signs:   I personally reviewed Vital signs (including pulse oximetry)    Vitals:    07/18/17 2020   BP: 121/80   Pulse: 92   Resp: 16   Temp:    SpO2: 100%  Physical Exam:   Physical Exam   Constitutional: She is oriented to person, place, and time. She appears well-developed and well-nourished. No distress.   HENT:   Head: Normocephalic and atraumatic.   Eyes: Pupils are equal, round, and reactive to light. EOM are normal.   Neck: Normal range of motion. Neck supple.   Cardiovascular: Normal rate, regular rhythm, normal heart sounds and intact distal pulses.    No murmur heard.  Pulmonary/Chest: Effort normal and breath sounds normal. No respiratory distress.   Abdominal: Soft. Bowel sounds are normal. She exhibits no distension. There is tenderness (rlq). There is no rebound and no guarding.   Genitourinary: Uterus normal. Vaginal discharge (mildoff white discharge, no erythema, no cmt) found.   Genitourinary Comments: Supervised by nurse noelle    Musculoskeletal: She exhibits no edema.   Neurological: She is alert and oriented to person, place, and time. She exhibits normal muscle tone.   Skin: Skin is warm and dry. No rash noted. She is not diaphoretic.   Psychiatric: She has a normal mood and affect.           Diagnostic Results:   The results of the diagnostic studies have been reviewed by myself:    Radiologic Studies  No results found.    Lab Studies  Labs Reviewed   CBC AND DIFFERENTIAL - Abnormal; Notable for the following:        Result Value    PLT CT 451 (*)     All other components within normal limits   COMPREHENSIVE METABOLIC PANEL - Abnormal; Notable for the following:     Protein, Total 8.5 (*)     Anion Gap 20.8 (*)     Globulin 4.1 (*)     All other components within normal limits   LIPASE - Abnormal; Notable for the following:     Lipase 4 (*)     All other components within normal limits   VH URINALYSIS WITH MICROSCOPIC AND CULTURE IF INDICATED       - Abnormal; Notable for the following:     Protein, UR 30 (*)     Ketones UA 80 (*)     Urobilinogen, UA 2.0 (*)     Leukocyte Esterase, UA 25 (*)     WBC, UA 15 (*)     All other components within normal limits    Narrative:     A Urine Culture has been ordered based upon the Positive UA results.   VH SMEAR, WET PREP    Narrative:     Specimen: Vaginal  Collected: 07/18/2017 16:25     Status: Final      Last Updated: 07/18/2017 16:46                Smear- Wet Prep (Final)      No WBC's Seen      No Clue Cells Seen      No Yeast or Hyphae Seen      No Trichomonas Seen         VH CULTURE, URINE    Narrative:     Specimen: Urine, Random  Collected: 07/18/2017 18:41     Status: Final      Last Updated: 07/19/2017 11:08                ISO #1 (Final)      Less than 10,000 CFU/mL      Gram Positive Flora Isolated.      Antibiotic sensitivities not  routinely performed on this organism.         VH STD AMPLIFIED DNA PROBE   HCG, SERUM, QUALITATIVE   C-REACTIVE PROTEIN             EKG/ Procedure /  Critical Care time:   none       MDM:   Differential Diagnosis includes but is not limited to: The differential diagnosis includes, but is not limited to vomiting, ureterolithiasis/renal colic, UTI/pyelonephritis, gastroenteritis/gastritis, peptic ulcer disease, GERD, appendicitis, aortic aneurysm, MI/angina/mesenteric ischemia, bowel perforation/obstruction, pancreatitis, cholecystitis, biliary colic, diverticulitis. , ectopic pregnancy, ovarian cyst, ovarian torsion, intrauterine pregnancy, cervicitis, PID    MDM: unclear etiology of patients pain. History is not consistent with torsion with no adnexal mass/cyst and normal flow, torsion is very unlikely at this time. Likewise, no acute findings on ct scan. On chart review, patient has had 4 previous ct scans without acute findings for abdominal pain. Her UA is suggestive of possible uti, she has no systemic signs of illness at this time. Was given strict return precautions. Is tolerating PO.       Diagnosis / Disposition:   Final Impression  1. Acute UTI    2. Lower abdominal pain        Disposition  ED Disposition     ED Disposition Condition Date/Time Comment    Discharge Boarder to Home  Sun Jul 19, 2017 12:22 PM           Follow up  Vivia Ewing, NP  [address]  [place] [address]  [phone]    In 2 days      [facility]  [address]  [place] [address]  [phone]    If symptoms worsen      Prescriptions  Discharge Medication List as of 07/18/2017  7:16 PM      START taking these medications    Details   cephalexin (KEFLEX) 500 MG capsule Take 1 capsule (500 mg total) by mouth 2 (two) times daily.for 7 days, Starting Sat 07/18/2017, Until Sat 07/25/2017, Print      ondansetron (ZOFRAN) 8 MG tablet Take 1 tablet (8 mg total) by mouth every 6 (six) hours as needed for Nausea., Starting Sat 07/18/2017, Print                ATTESTATIONS   The HPI, ROS and physical exam of this chart were created by a scribe and  later reviewed by myself. Likewise, this note was generated by the Epic EMR system/ Dragon speech recognition and may contain inherent errors or omissions not intended by the user. Grammatical errors, random word insertions, deletions, pronoun errors and incomplete sentences are occasional consequences of this technology due to software limitations. Not all errors are caught or corrected. If there are questions or concerns about the content of this note or information contained within the body of this dictation they should be addressed directly with the author for clarification

## 2017-07-23 NOTE — ED Notes (Addendum)
Received call from lab, they were unable to run STD profile due to cleaning swab sent instead of testing swab.  Attempted to reach patient by phone, but unable to leave a message due to mailbox being full.  I mailed letter to the patient to please call our office to discuss this issue and that we could order an STD panel (urine) as an outpatient if she would like confirmation of this test, verbal order by Dr. Roseanna Rainbow to order as an outpatient.

## 2017-08-10 ENCOUNTER — Observation Stay
Admission: EM | Admit: 2017-08-10 | Discharge: 2017-08-12 | Disposition: A | Discharge status: Home / Self Care | Payer: No Typology Code available for payment source | Attending: Internal Medicine | Admitting: Internal Medicine

## 2017-08-10 DIAGNOSIS — E063 Autoimmune thyroiditis: Secondary | ICD-10-CM | POA: Insufficient documentation

## 2017-08-10 DIAGNOSIS — K5090 Crohn's disease, unspecified, without complications: Secondary | ICD-10-CM | POA: Insufficient documentation

## 2017-08-10 DIAGNOSIS — R109 Unspecified abdominal pain: Secondary | ICD-10-CM | POA: Insufficient documentation

## 2017-08-10 DIAGNOSIS — E039 Hypothyroidism, unspecified: Secondary | ICD-10-CM | POA: Insufficient documentation

## 2017-08-10 DIAGNOSIS — N12 Tubulo-interstitial nephritis, not specified as acute or chronic: Principal | ICD-10-CM | POA: Insufficient documentation

## 2017-08-10 DIAGNOSIS — Z87440 Personal history of urinary (tract) infections: Secondary | ICD-10-CM | POA: Insufficient documentation

## 2017-08-10 DIAGNOSIS — E282 Polycystic ovarian syndrome: Secondary | ICD-10-CM | POA: Insufficient documentation

## 2017-08-10 LAB — URINALYSIS, REFLEX TO MICROSCOPIC EXAM IF INDICATED
Bilirubin, UA: NEGATIVE
Blood, UA: NEGATIVE
Glucose, UA: NEGATIVE
Ketones UA: NEGATIVE
Nitrite, UA: NEGATIVE
Protein, UR: 100 — AB
Specific Gravity UA: 1.024 (ref 1.001–1.035)
Urine pH: 7 (ref 5.0–8.0)
Urobilinogen, UA: NEGATIVE mg/dL

## 2017-08-10 LAB — COMPREHENSIVE METABOLIC PANEL
ALT: 18 U/L (ref 0–55)
AST (SGOT): 27 U/L (ref 5–34)
Albumin/Globulin Ratio: 0.9 (ref 0.9–2.2)
Albumin: 4.1 g/dL (ref 3.5–5.0)
Alkaline Phosphatase: 122 U/L — ABNORMAL HIGH (ref 37–106)
Anion Gap: 9 (ref 5.0–15.0)
BUN: 16 mg/dL (ref 7.0–19.0)
Bilirubin, Total: 0.5 mg/dL (ref 0.2–1.2)
CO2: 26 mEq/L (ref 22–29)
Calcium: 9.9 mg/dL (ref 8.5–10.5)
Chloride: 104 mEq/L (ref 100–111)
Creatinine: 0.9 mg/dL (ref 0.6–1.0)
Globulin: 4.5 g/dL — ABNORMAL HIGH (ref 2.0–3.6)
Glucose: 95 mg/dL (ref 70–100)
Potassium: 4.1 mEq/L (ref 3.5–5.1)
Protein, Total: 8.6 g/dL — ABNORMAL HIGH (ref 6.0–8.3)
Sodium: 139 mEq/L (ref 136–145)

## 2017-08-10 LAB — CBC AND DIFFERENTIAL
Absolute NRBC: 0 10*3/uL
Basophils Absolute Automated: 0.05 10*3/uL (ref 0.00–0.20)
Basophils Automated: 0.5 %
Eosinophils Absolute Automated: 0.08 10*3/uL (ref 0.00–0.70)
Eosinophils Automated: 0.8 %
Hematocrit: 40.8 % (ref 37.0–47.0)
Hgb: 13.2 g/dL (ref 12.0–16.0)
Immature Granulocytes Absolute: 0.04 10*3/uL
Immature Granulocytes: 0.4 %
Lymphocytes Absolute Automated: 2.85 10*3/uL (ref 0.50–4.40)
Lymphocytes Automated: 28.4 %
MCH: 29.5 pg (ref 28.0–32.0)
MCHC: 32.4 g/dL (ref 32.0–36.0)
MCV: 91.3 fL (ref 80.0–100.0)
MPV: 8.9 fL — ABNORMAL LOW (ref 9.4–12.3)
Monocytes Absolute Automated: 0.86 10*3/uL (ref 0.00–1.20)
Monocytes: 8.6 %
Neutrophils Absolute: 6.14 10*3/uL (ref 1.80–8.10)
Neutrophils: 61.3 %
Nucleated RBC: 0 /100 WBC (ref 0.0–1.0)
Platelets: 483 10*3/uL — ABNORMAL HIGH (ref 140–400)
RBC: 4.47 10*6/uL (ref 4.20–5.40)
RDW: 15 % (ref 12–15)
WBC: 10.02 10*3/uL (ref 3.50–10.80)

## 2017-08-10 LAB — LIPASE: Lipase: 9 U/L (ref 8–78)

## 2017-08-10 LAB — HEMOLYSIS INDEX: Hemolysis Index: 18 (ref 0–18)

## 2017-08-10 LAB — URINE BHCG POC: Urine bHCG POC: NEGATIVE

## 2017-08-10 LAB — GFR: EGFR: >60

## 2017-08-10 MED ORDER — MORPHINE SULFATE 2 MG/ML IJ/IV SOLN (WRAP)
2.0000 mg | Freq: Once | Status: AC
Start: 2017-08-10 — End: 2017-08-10
  Administered 2017-08-10: 2 mg via INTRAVENOUS
  Filled 2017-08-10: qty 1

## 2017-08-10 MED ORDER — KETOROLAC TROMETHAMINE 15 MG/ML IJ SOLN
15.0000 mg | Freq: Once | INTRAMUSCULAR | Status: AC
Start: 2017-08-10 — End: 2017-08-10
  Administered 2017-08-10: 15 mg via INTRAVENOUS
  Filled 2017-08-10: qty 1

## 2017-08-10 MED ORDER — PROMETHAZINE HCL 25 MG/ML IJ SOLN
12.5000 mg | Freq: Once | INTRAMUSCULAR | Status: AC
Start: 2017-08-10 — End: 2017-08-10
  Administered 2017-08-10: 12.5 mg via INTRAVENOUS
  Filled 2017-08-10: qty 1

## 2017-08-10 MED ORDER — SODIUM CHLORIDE 0.9 % IV BOLUS
1000.0000 mL | Freq: Once | INTRAVENOUS | Status: AC
Start: 2017-08-10 — End: 2017-08-10
  Administered 2017-08-10: 1000 mL via INTRAVENOUS

## 2017-08-10 MED ORDER — SODIUM CHLORIDE 0.9 % IV MBP
1.0000 g | Freq: Once | INTRAVENOUS | Status: AC
Start: 2017-08-10 — End: 2017-08-11
  Administered 2017-08-10: 1 g via INTRAVENOUS
  Filled 2017-08-10: qty 100
  Filled 2017-08-10: qty 1000

## 2017-08-10 MED ORDER — ONDANSETRON HCL 4 MG/2ML IJ SOLN
4.0000 mg | Freq: Once | INTRAMUSCULAR | Status: AC
Start: 2017-08-10 — End: 2017-08-10
  Administered 2017-08-10: 4 mg via INTRAVENOUS
  Filled 2017-08-10: qty 2

## 2017-08-10 NOTE — ED Triage Notes (Signed)
Pt to ED with c/o R sided flank pain with intermittent fever, emesis, x 3 days. Pt has hx of pyelo.

## 2017-08-10 NOTE — ED Notes (Signed)
Bed: EX25  Expected date:   Expected time:   Means of arrival:   Comments:

## 2017-08-10 NOTE — ED Provider Notes (Signed)
EMERGENCY DEPARTMENT HISTORY AND PHYSICAL EXAM     Physician/Midlevel provider first contact with patient: 08/10/17 2138         Date: 08/10/2017  Patient Name: [name]  Attending Physician: French Ana, MD  Advanced Practice Provider: Arbutus Leas    History of Presenting Illness       History Provided By: Patient    Chief Complaint:  Chief Complaint   Patient presents with   . Flank Pain     History: [name] is a 31 y.o. female wit hx of recurrent pylenonephriitis presenting to the ED with R sided flank pain, dysuria, nausea, vomiting, and increased urinary frequency. She also describes hx of kidney stones. She notes current pain does not feel similar to stones and feels similar to previous episodes of kidney infections. Describes last bladder infection one month ago and symptoms improved but did not resolve completely with antibiotics. Patient denies any vaginal bleeding or discharge. Denies fevers.     PCP: Vivia Ewing, NP  SPECIALISTS:    Current Facility-Administered Medications   Medication Dose Route Frequency Provider Last Rate Last Dose   . 0.9%  NaCl infusion   Intravenous Continuous Oneida Alar, MD 150 mL/hr at 08/11/17 0545     . acetaminophen (TYLENOL) tablet 650 mg  650 mg Oral Q4H PRN Oneida Alar, MD        Or   . acetaminophen (TYLENOL) suppository 650 mg  650 mg Rectal Q4H PRN Oneida Alar, MD       . FLUoxetine (PROzac) capsule 40 mg  40 mg Oral Daily Oneida Alar, MD       . ketorolac (TORADOL) injection 15 mg  15 mg Intravenous Q6H PRN Oneida Alar, MD       . levoFLOXacin (LEVAQUIN) 750mg  in D5W IVPB (premix)  750 mg Intravenous Q24H Oneida Alar, MD       . levothyroxine (SYNTHROID, LEVOTHROID) tablet 88 mcg  88 mcg Oral Daily Valentino Nose E, MD       . morphine injection 2 mg  2 mg Intravenous Q6H PRN Oneida Alar, MD       . ondansetron (ZOFRAN-ODT) disintegrating tablet 4 mg  4 mg Oral Q6H  PRN Oneida Alar, MD        Or   . ondansetron (ZOFRAN) injection 4 mg  4 mg Intravenous Q6H PRN Oneida Alar, MD       . oxyCODONE-acetaminophen (PERCOCET) 5-325 MG per tablet 2 tablet  2 tablet Oral Q4H PRN Oneida Alar, MD   2 tablet at 08/11/17 0546   . promethazine (PHENERGAN) tablet 25 mg  25 mg Oral Q6H PRN Oneida Alar, MD   25 mg at 08/11/17 0546       Past History     Past Medical History:  Past Medical History:   Diagnosis Date   . Abortion    . Anxiety    . Crohn's disease    . Depression    . H/O Hashimoto thyroiditis     hypothyroidism   . PCOS (polycystic ovarian syndrome)    . Tachycardia        Past Surgical History:  Past Surgical History:   Procedure Laterality Date   . BREAST IMPLANT     . COLONOSCOPY     . D & C, SUCTION N/A 08/24/2014    Procedure: D & C, SUCTION;  Surgeon: Maryruth Hancock,  MD;  Location: [place] [facility];  Service: Obstetrics;  Laterality: N/A;   . INDUCED ABORTION         Family History:  History reviewed. No pertinent family history.    Social History:  Social History     Social History   . Marital status: Single     Spouse name: N/A   . Number of children: N/A   . Years of education: N/A     Social History Main Topics   . Smoking status: Former Smoker     Quit date: 12/30/2007   . Smokeless tobacco: Never Used   . Alcohol use Yes      Comment: social   . Drug use: No   . Sexual activity: Yes     Partners: Male     Birth control/ protection: Condom     Other Topics Concern   . None     Social History Narrative   . None       Allergies:  Allergies   Allergen Reactions   . Bentyl [Dicyclomine] Hives   . Bactrim [Sulfamethoxazole W/Trimethoprim (Co-Trimoxazole)] Rash   . Sulfa Antibiotics Rash       Review of Systems     Review of Systems   Constitutional: Negative for chills and fever.   HENT: Negative for congestion, ear pain and sore throat.    Respiratory: Negative for cough, hemoptysis, sputum production, shortness of breath and wheezing.     Cardiovascular: Negative for chest pain and palpitations.   Gastrointestinal: Positive for abdominal pain, nausea and vomiting. Negative for diarrhea and melena.   Genitourinary: Positive for dysuria, flank pain, frequency and urgency. Negative for hematuria.   Musculoskeletal: Negative for back pain, joint pain and neck pain.   Skin: Negative for itching and rash.   Neurological: Negative for dizziness and headaches.   Psychiatric/Behavioral: Negative for suicidal ideas.       Physical Exam     Vitals:    08/10/17 2139 08/10/17 2349 08/11/17 0210 08/11/17 0518   BP: 140/89 112/66 114/75 110/67   Pulse: (!) 104 (!) 104 88 83   Resp: 18 18 18 16    Temp: 98.9 F (37.2 C) 98.8 F (37.1 C) 98.4 F (36.9 C) 97.1 F (36.2 C)   TempSrc:   Oral Oral   SpO2: 99% 98% 97% 98%   Weight:    53.4 kg   Height:    5\' 1"  (1.549 m)       Physical Exam   Constitutional: She is oriented to person, place, and time. She appears well-developed and well-nourished. No distress.   HENT:   Head: Normocephalic and atraumatic.   Eyes: Pupils are equal, round, and reactive to light. Conjunctivae are normal.   Neck: Normal range of motion. Neck supple.   Cardiovascular: Normal rate, regular rhythm and normal heart sounds.  Exam reveals no gallop and no friction rub.    No murmur heard.  Pulmonary/Chest: Effort normal and breath sounds normal. No respiratory distress. She has no wheezes. She has no rales.   Abdominal: Soft. She exhibits no distension. There is no tenderness. There is no rebound and no guarding.   Musculoskeletal: Normal range of motion. She exhibits tenderness (CVA tenderness Left side). She exhibits no edema.   Neurological: She is alert and oriented to person, place, and time.   Skin: Skin is warm and dry. No rash noted. She is not diaphoretic.   Nursing note and vitals reviewed.  Diagnostic Study Results     Labs -     Results     Procedure Component Value Units Date/Time    Urine culture [[id number]] Collected:   08/11/17 0206    Specimen:  Urine from Urine, Clean Catch Updated:  08/11/17 0457    Narrative:       Replace urinary catheter prior to obtaining the urine culture  if it has been in place for greater than or equal to 14  days:->No  Indications for Urine Culture:->Costovertebral Angle  Tenderness    Hemolysis index [[id number]] Collected:  08/10/17 2240     Updated:  08/10/17 2301     Hemolysis Index 18    GFR [[id number]] Collected:  08/10/17 2240     Updated:  08/10/17 2301     EGFR >60.0    Comprehensive metabolic panel [[id number]]  (Abnormal) Collected:  08/10/17 2240    Specimen:  Blood Updated:  08/10/17 2301     Glucose 95 mg/dL      BUN 62.9 mg/dL      Creatinine 0.9 mg/dL      Sodium 528 mEq/L      Potassium 4.1 mEq/L      Chloride 104 mEq/L      CO2 26 mEq/L      Calcium 9.9 mg/dL      Protein, Total 8.6 (H) g/dL      Albumin 4.1 g/dL      AST (SGOT) 27 U/L      ALT 18 U/L      Alkaline Phosphatase 122 (H) U/L      Bilirubin, Total 0.5 mg/dL      Globulin 4.5 (H) g/dL      Albumin/Globulin Ratio 0.9     Anion Gap 9.0    Lipase [[id number]] Collected:  08/10/17 2240    Specimen:  Blood Updated:  08/10/17 2301     Lipase 9 U/L     UA, Reflex to Microscopic (pts 3 + yrs) [[id number]]  (Abnormal) Collected:  08/10/17 2240    Specimen:  Urine Updated:  08/10/17 2254     Urine Type Clean Catch     Color, UA Yellow     Clarity, UA Sl Cloudy (A)     Specific Gravity UA 1.024     Urine pH 7.0     Leukocyte Esterase, UA Large (A)     Nitrite, UA Negative     Protein, UR 100 (A)     Glucose, UA Negative     Ketones UA Negative     Urobilinogen, UA Negative mg/dL      Bilirubin, UA Negative     Blood, UA Negative     RBC, UA 11 - 25 (A) /hpf      WBC, UA TNTC (A) /hpf      Squamous Epithelial Cells, Urine 11 - 25 /hpf     CBC with differential [[id number]]  (Abnormal) Collected:  08/10/17 2240    Specimen:  Blood from Blood Updated:  08/10/17 2249     WBC 10.02 x10 3/uL      Hgb 13.2 g/dL      Hematocrit 63.8 %       Platelets 483 (H) x10 3/uL      RBC 4.47 x10 6/uL      MCV 91.3 fL      MCH 29.5 pg      MCHC 32.4 g/dL      RDW 15 %      MPV  8.9 (L) fL      Neutrophils 61.3 %      Lymphocytes Automated 28.4 %      Monocytes 8.6 %      Eosinophils Automated 0.8 %      Basophils Automated 0.5 %      Immature Granulocyte 0.4 %      Nucleated RBC 0.0 /100 WBC      Neutrophils Absolute 6.14 x10 3/uL      Abs Lymph Automated 2.85 x10 3/uL      Abs Mono Automated 0.86 x10 3/uL      Abs Eos Automated 0.08 x10 3/uL      Absolute Baso Automated 0.05 x10 3/uL      Absolute Immature Granulocyte 0.04 x10 3/uL      Absolute NRBC 0.00 x10 3/uL     Urine BHCG POC [[id number]] Collected:  08/10/17 2239     Updated:  08/10/17 2245     Urine bHCG POC Negative          Radiologic Studies -   Radiology Results (24 Hour)     Procedure Component Value Units Date/Time    US Kidney/ Bladder [[id number]] Collected:  08/11/17 0228    Order Status:  Completed Updated:  08/11/17 0233    Narrative:       US RENAL KIDNEY BLADDER COMPLETE    HISTORY: flank pain.        COMPARISON: None.    TECHNIQUE: Multiple sonographic grayscale and color Doppler images were  obtained of the kidneys and bladder.    FINDINGS:  Right Kidney: The right kidney measures 9 x 3.8 x 4.7 cm. There is no  evidence of renal mass, hydronephrosis, or nephrolithiasis.     Left Kidney: The left kidney measures 9.5 x 5 x 4.3 cm. There is no  evidence of renal mass, hydronephrosis, or nephrolithiasis.    Bladder:  The bladder is not visualized      Impression:         1. Normal appearance of the kidneys.    [name], MD   08/11/2017 2:29 AM      .    Medical Decision Making   I am the first provider for this patient.    I reviewed the vital signs, available nursing notes, past medical history, past surgical history, family history and social history.    Vital Signs-Reviewed the patient's vital signs.     Patient Vitals for the past 12 hrs:   BP Temp Pulse Resp   08/11/17 0518 110/67 97.1 F  (36.2 C) 83 16   08/11/17 0210 114/75 98.4 F (36.9 C) 88 18   08/10/17 2349 112/66 98.8 F (37.1 C) (!) 104 18   08/10/17 2139 140/89 98.9 F (37.2 C) (!) 104 18       Pulse Oximetry Analysis -  Normal, 98%      Procedures:   none      For Hospitalized Patients:    1. Hospitalization Decision Time:  The decision to admit this patient was made by the emergency provider at 250[time] on 08/10/2017     2. Aspirin: Aspirin was not given because the patient did not present with a stroke at the time of their Emergency Department evaluation.      Clinical Decision Support:       Old Medical Records: Old medical records.  Nursing notes.     ED Course:        Provider Notes:  2302: Pt continues to complain of nausea. Describes phenergan works better, will order. Pt still complains of pain. Toradol ordered. Ceftriaxone given for Pyelonephritis. Will attempt PO challenge.     1215: Pt still complains of R flank pain, ordered CT abdomen pelvis to evaluate for stone, pt refuses staing she has had many CT scans and does not want more radiation. Discussed ultrasound with patient who is agreeable.    105: Pt continues to complain of pain, repeat morphine, re evaluated, abdomen is soft and non tender. + CVA tenderness.  Awaiting Renal US.     2:53 AM -   Pt continues to vomit despite zofran, phenergan,and pain is uncontrolled. Pt is unable to tolerate PO. Will admit for IV antibiotics and pain control.    3:26 AM - Discussed with [facility] hospitalist Dr. Dessie Coma who accepts admission.     D/w French Ana, MD      Diagnosis     Clinical Impression:   1. Pyelonephritis        Treatment Plan:   ED Disposition     ED Disposition Condition Date/Time Comment    Observation  Tue Aug 11, 2017  3:27 AM Admitting Physician: Oneida Alar [[id number]]   Diagnosis: Pyelonephritis [604540]   Estimated Length of Stay: < 2 midnights   Tentative Discharge Plan?: Home or Self Care [1]   Patient Class: Observation [104]               _______________________________    CHART OWNERSHIPConstance Goltz, PA-C, am the primary clinician of record.  _______________________________       Arbutus Leas, PA  08/11/17 9811       French Ana, MD  08/12/17 [phone]

## 2017-08-10 NOTE — ED Notes (Signed)
Bed: PU36  Expected date:   Expected time:   Means of arrival:   Comments:  PU PT

## 2017-08-10 NOTE — EDIE (Signed)
[name]?NOTIFICATION?08/10/2017 21:21?Allison, Felicetti [name]?MRN: [id number]    This patient has registered at the [facility] Emergency Department   For more information visit: [email or web address]   Criteria met      5 ED Visits in 12 Months    3 Different Facilities in 90 Days    Security Events  No recent Security Events currently on file    ED Care Guidelines  There are currently no ED Care Guidelines in [place] for this patient. Please check your facility's medical records system.    Care Providers  Provider [facility] Type Phone Fax Service Dates   Elenore Paddy [facility]   Current      E.D. Visit Count (12 mo.)  Facility Visits   [facility] 1   [facility] 4   [facility] 2   [facility] 4   Total 11   Note: Visits indicate total known visits.      Recent Emergency Department Visit Summary  Admit Date Facility [facility] Type Major Type Diagnoses or Chief Complaint   Aug 10, 2017 [place] [facility] [place] [facility]  Emergency      Back Pain      Jul 18, 2017 [facility] [place] [place] [facility]  Emergency      ABD PAIN      Abdominal Pain      Lower abdominal pain, unspecified      Urinary tract infection, site not specified      May 24, 2017 [facility] [name] [place] [facility]  Emergency      Toxic encephalopathy          Recent Inpatient Visit Summary  No recorded inpatient visits.       Prescription Monitoring Program  360??- Narcotic Use Score  330??- Sedative Use Score  000??- Stimulant Use Score  - All Scores range from 000-999 with 75% of the population scoring < 200 and on 1% scoring above 650  - The last digit of the narcotic, sedative, and stimulant score indicates the number of active prescriptions of that type  - Higher Use scores correlate with increased prescribers, pharmacies, mg equiv, and overlapping prescriptions   Concerning or unexpectedly high scores should prompt a review of  the PMP record; this does not constitute checking PMP for prescribing purposes.    The above information is provided for the sole purpose of patient treatment. Use of this information beyond the terms of Data Sharing Memorandum of Understanding and License Agreement is prohibited. In certain cases not all visits may be represented. Consult the aforementioned facilities for additional information.   ? 2018 [employer] - [place], [place] - [email or web address]

## 2017-08-11 ENCOUNTER — Emergency Department: Payer: No Typology Code available for payment source

## 2017-08-11 LAB — LACTIC ACID, PLASMA: Lactic Acid: 0.7 mmol/L (ref 0.2–2.0)

## 2017-08-11 LAB — TSH: TSH: 2.41 u[IU]/mL (ref 0.35–4.94)

## 2017-08-11 MED ORDER — FLUOXETINE HCL 20 MG PO CAPS
40.0000 mg | ORAL_CAPSULE | Freq: Every day | ORAL | Status: DC
Start: 2017-08-11 — End: 2017-08-12
  Administered 2017-08-11 – 2017-08-12 (×2): 40 mg via ORAL
  Filled 2017-08-11 (×3): qty 2

## 2017-08-11 MED ORDER — PROMETHAZINE HCL 25 MG PO TABS
25.0000 mg | ORAL_TABLET | Freq: Four times a day (QID) | ORAL | Status: DC | PRN
Start: 2017-08-11 — End: 2017-08-12
  Administered 2017-08-11: 25 mg via ORAL
  Filled 2017-08-11: qty 1

## 2017-08-11 MED ORDER — MORPHINE SULFATE 2 MG/ML IJ/IV SOLN (WRAP)
2.0000 mg | Status: DC | PRN
Start: 2017-08-11 — End: 2017-08-12
  Administered 2017-08-11 – 2017-08-12 (×4): 2 mg via INTRAVENOUS
  Filled 2017-08-11 (×4): qty 1

## 2017-08-11 MED ORDER — ACETAMINOPHEN 325 MG PO TABS
650.0000 mg | ORAL_TABLET | ORAL | Status: DC | PRN
Start: 2017-08-11 — End: 2017-08-12

## 2017-08-11 MED ORDER — LEVOFLOXACIN IN D5W 750 MG/150ML IV SOLN
750.0000 mg | INTRAVENOUS | Status: DC
Start: 2017-08-11 — End: 2017-08-11
  Administered 2017-08-11: 750 mg via INTRAVENOUS
  Filled 2017-08-11: qty 150

## 2017-08-11 MED ORDER — OXYCODONE-ACETAMINOPHEN 5-325 MG PO TABS
2.0000 | ORAL_TABLET | ORAL | Status: DC | PRN
Start: 2017-08-11 — End: 2017-08-12
  Administered 2017-08-11 – 2017-08-12 (×5): 2 via ORAL
  Filled 2017-08-11 (×5): qty 2

## 2017-08-11 MED ORDER — SODIUM CHLORIDE 0.9 % IV MBP
2.0000 g | INTRAVENOUS | Status: DC
Start: 2017-08-11 — End: 2017-08-12
  Administered 2017-08-11: 2 g via INTRAVENOUS
  Filled 2017-08-11 (×2): qty 2000

## 2017-08-11 MED ORDER — KETOROLAC TROMETHAMINE 15 MG/ML IJ SOLN
15.0000 mg | Freq: Four times a day (QID) | INTRAMUSCULAR | Status: DC | PRN
Start: 2017-08-11 — End: 2017-08-12

## 2017-08-11 MED ORDER — ACETAMINOPHEN 650 MG RE SUPP
650.0000 mg | RECTAL | Status: DC | PRN
Start: 2017-08-11 — End: 2017-08-12

## 2017-08-11 MED ORDER — ONDANSETRON 4 MG PO TBDP
4.0000 mg | ORAL_TABLET | Freq: Four times a day (QID) | ORAL | Status: DC | PRN
Start: 2017-08-11 — End: 2017-08-12

## 2017-08-11 MED ORDER — MORPHINE SULFATE 4 MG/ML IJ/IV SOLN (WRAP)
4.0000 mg | Freq: Once | Status: AC
Start: 2017-08-11 — End: 2017-08-11
  Administered 2017-08-11: 4 mg via INTRAVENOUS
  Filled 2017-08-11: qty 1

## 2017-08-11 MED ORDER — LEVOTHYROXINE SODIUM 88 MCG PO TABS
88.0000 ug | ORAL_TABLET | Freq: Every day | ORAL | Status: DC
Start: 2017-08-11 — End: 2017-08-12
  Administered 2017-08-11 – 2017-08-12 (×2): 88 ug via ORAL
  Filled 2017-08-11 (×3): qty 1

## 2017-08-11 MED ORDER — SODIUM CHLORIDE 0.9 % IV SOLN
INTRAVENOUS | Status: DC
Start: 2017-08-11 — End: 2017-08-12
  Administered 2017-08-11: 150 mL/h via INTRAVENOUS

## 2017-08-11 MED ORDER — MORPHINE SULFATE 2 MG/ML IJ/IV SOLN (WRAP)
2.0000 mg | Freq: Four times a day (QID) | Status: DC | PRN
Start: 2017-08-11 — End: 2017-08-11
  Administered 2017-08-11: 2 mg via INTRAVENOUS
  Filled 2017-08-11: qty 1

## 2017-08-11 MED ORDER — RISAQUAD PO CAPS
1.0000 | ORAL_CAPSULE | Freq: Every day | ORAL | Status: DC
Start: 2017-08-11 — End: 2017-08-12
  Administered 2017-08-11 – 2017-08-12 (×2): 1 via ORAL
  Filled 2017-08-11 (×2): qty 1

## 2017-08-11 MED ORDER — ONDANSETRON HCL 4 MG/2ML IJ SOLN
4.0000 mg | Freq: Four times a day (QID) | INTRAMUSCULAR | Status: DC | PRN
Start: 2017-08-11 — End: 2017-08-12
  Administered 2017-08-12: 4 mg via INTRAVENOUS
  Filled 2017-08-11: qty 2

## 2017-08-11 NOTE — ED Notes (Signed)
NURSING NOTE FOR THE RECEIVING INPATIENT NURSE   ED NURSE [place]   [place] [address]   ADMISSION INFORMATION   [name] is a 31 y.o. female admitted with a diagnosis of:    No diagnosis found.   NURSING CARE   LOC Oriented to person, place, time, and general circumstances.   ADL Independent with all ADLs   Pertinent Information with c/o R sided flank pain with intermittent fever, emesis, x 3 days. Pt has hx of pyelo    VITAL SIGNS     Vitals:    08/11/17 0210   BP: 114/75   Pulse: 88   Resp: 18   Temp: 98.4 F (36.9 C)   SpO2: 97%        IV LINES     IV Catheter Size: 22 g Lt hand    Peripheral IV 08/10/17 Left Hand (Active)   Site Assessment Clean;Intact;Dry 08/10/2017 10:45 PM   Number of days: 0          LAB RESULTS     Labs Reviewed   URINALYSIS, REFLEX TO MICROSCOPIC EXAM IF INDICATED - Abnormal; Notable for the following:        Result Value    Clarity, UA Sl Cloudy (*)     Leukocyte Esterase, UA Large (*)     Protein, UR 100 (*)     RBC, UA 11 - 25 (*)     WBC, UA TNTC (*)     All other components within normal limits   CBC AND DIFFERENTIAL - Abnormal; Notable for the following:     Platelets 483 (*)     MPV 8.9 (*)     All other components within normal limits   COMPREHENSIVE METABOLIC PANEL - Abnormal; Notable for the following:     Protein, Total 8.6 (*)     Alkaline Phosphatase 122 (*)     Globulin 4.5 (*)     All other components within normal limits   URINE CULTURE    Narrative:     Replace urinary catheter prior to obtaining the urine culture  if it has been in place for greater than or equal to 14  days:->No  Indications for Urine Culture:->Costovertebral Angle  Tenderness   LIPASE   HEMOLYSIS INDEX   GFR   URINE BHCG POC SOFT

## 2017-08-11 NOTE — Plan of Care (Signed)
Problem: Pain  Goal: Pain at adequate level as identified by patient  Outcome: Progressing   08/11/17 0628   Goal/Interventions addressed this shift   Pain at adequate level as identified by patient Identify patient comfort function goal;Assess pain on admission, during daily assessment and/or before any "as needed" intervention(s);Reassess pain within 30-60 minutes of any procedure/intervention, per Pain Assessment, Intervention, Reassessment (AIR) Cycle;Evaluate if patient comfort function goal is met       Problem: Altered GI Function  Goal: Elimination patterns are normal or improving  Outcome: Progressing   08/11/17 1610   Goal/Interventions addressed this shift   Elimination patterns are normal or improving Report abnormal assessment to physician;Anticipate/assist with toileting needs;Assess for normal bowel sounds       Problem: Bladder/Voiding  Goal: Free from infection  Outcome: Progressing   08/11/17 0628   Goal/Interventions addressed this shift   Free from infection Monitor/assess for signs and symptoms of infection       Problem: Infection  Goal: Free from infection  Outcome: Progressing   08/11/17 0628   OTHER   Free from infection  Assess for signs/symptoms of infection;Utilize sepsis protocol       Comments: Pt reports nausea, vomited at home, relieved by phenergan. Pt reports 6/10 rt flank pain, relieved by percocet and morphine. Pt had bladder infection 2 weeks ago, but no recent irritation when urinating. Pt on IV abx and IVF.

## 2017-08-11 NOTE — Treatment Plan (Signed)
Chart reviewed.

## 2017-08-11 NOTE — Consults (Addendum)
Infectious Diseases and Tropical Medicine Consult  Tenna Child, MD          Date Time: 08/11/17 11:11 AM  Patient Name: [name] MARIE  Referring Physician: Jolyn Lent, MD      Reason for Consultation:      Acute pyelonephritis    Assessment:      Urinalysis consistent with UTI.   Renal ultrasound-no stones/obstruction   History of Crohn's disease-not on Humira since last 8 months   Hypothyroidism   History of recurrent UTIs.   No fever or leukocytosis    Recommendations:      Start ceftriaxone while awaiting urine culture.   Await pending urine cultures   Probiotics    Monitor electrolytes and renal functions closely   Monitor clinically                                                                   History of Present Illness:     Ms. Quain is a 31 year old female with a history of Crohn's disease, hypothyroidism secondary to Hashimoto's thyroiditis, generalized anxiety disorder, recurrent urinary tract infections is admitted since 08/10/2017 with dysuria, increased urinary frequency and right flank pain.Complains of nausea vomiting and temperature of 100.4 at home.Denies any chest pain cough or shortness of breath.No seizure or syncopal episode.  Urinalysis is consistent with UTI.Urine and blood cultures are ordered and are pending.Renal ultrasound is unremarkable.  Patient is currently on Levaquin.  No fever or leukocytosis.  Patient continues to complain of right flank pain.    Past Medical History:      Abortion   Anxiety   Crohn's disease   Depression   Hypothyroidism   Polycystic ovarian syndrome   Tachycardia    Past Surgical History:      Breast implant   Colonoscopy   D & C suction   Induced abortion    Family History:      Noncontributory    Social History:      No smoking    No alcohol; rare    Allergies:      Bentyl   Sulfa antibiotics    Review of Systems:     General:  Comfortable, no acute distress.Complains of fevers before coming to the  hospital.  HEENT: No runny nose or sore  Respiratory: no cough or shortness of breath, no hematemesis or hemoptysis  Cardiac: no chest pain  Abdomen: Complains of right flank and back pain, no nausea vomiting or diarrhea  Neurologic: awake and alert  Extremities: no joint pains or swelling.  Genitourinary: Complains of dysuria and increased urinary frequency  Dermatologic: no skin rashes, no itching    Physical Exam:     Blood pressure 104/63, pulse 94, temperature 97.6 F (36.4 C), temperature source Oral, resp. rate 17, height 1.549 m (5\' 1" ), weight 53.4 kg (117 lb 12.8 oz), last menstrual period 08/04/2017, SpO2 98 %, currently breastfeeding.    General Appearance: Comfortable, well-appearing and in no acute distress.    HEENT:  Head is normocephalic, atraumatic, pupils are equal and reactive to light  Neck:    Supple,No neck lymphadenopathy, no thyromegaly  Lungs:  Clear to auscultation   Chest Wall: Symmetric chest wall expansion.   Heart : Regular rate and  rhythm, no murmur or gallop  Abdomen: Abdomen is soft, positive tenderness in right CVA, good bowel sounds, no hepatosplenomegaly  Neurological: Awake and alert, normal muscle strength, no focal deficit  Extremities: No edema, no clubbing or cyanosis  Skin:  Warm and dry.No rash or ecchymosis.   Psychiatric: Mood and affect is normal    Labs:     Recent Labs      08/10/17   2240   WBC  10.02   Hgb  13.2   Hematocrit  40.8   Platelets  483*   MCV  91.3       Recent Labs      08/10/17   2240   Sodium  139   Potassium  4.1   Chloride  104   CO2  26   BUN  16.0   Creatinine  0.9   Glucose  95   Calcium  9.9       Recent Labs      08/10/17   2240   AST (SGOT)  27   ALT  18   Alkaline Phosphatase  122*   Protein, Total  8.6*   Albumin  4.1   Bilirubin, Total  0.5         Imaging studies:           Thanks for the consultation          Signed by: Tenna Child, MD  Date Time: 08/11/17 11:11 AM      *This note was generated by the Epic EMR system/ Dragon  speech recognition and may contain inherent errors or omissions not intended by the user. Grammatical errors, random word insertions, deletions, pronoun errors and incomplete sentences are occasional consequences of this technology due to software limitations. Not all errors are caught or corrected. If there are questions or concerns about the content of this note or information contained within the body of this dictation they should be addressed directly with the author for clarification

## 2017-08-11 NOTE — Plan of Care (Signed)
Problem: Safety  Goal: Patient will be free from injury during hospitalization  Outcome: Progressing   08/11/17 1843   Goal/Interventions addressed this shift   Patient will be free from injury during hospitalization  Assess patient's risk for falls and implement fall prevention plan of care per policy;Use appropriate transfer methods;Provide and maintain safe environment;Ensure appropriate safety devices are available at the bedside;Include patient/ family/ care giver in decisions related to safety;Hourly rounding;Assess for patients risk for elopement and implement Elopement Risk Plan per policy;Provide alternative method of communication if needed (communication boards, writing)   PT A&Ox4, call light within reach, fall bundle in place, purposeful rounding in progress, PT presented with R flank pain, possible Pyelonephritis, on pain management with morphine and percocet. Has a hx of Crohns, hasimoto, anxiety. Awaiting UA culture. On IV abt.

## 2017-08-11 NOTE — Treatment Plan (Signed)
Pt admitted and oriented to unit.

## 2017-08-11 NOTE — Progress Notes (Signed)
CM spoke with pt re: discharge plan. Pt is admitted from home due to pyelonephritis.  Pt lives with a roommate and independent pta.  Pt has good friend and family support.  No past DME, HH or SNF.  DCP is home with no needs and roommate to transport.  CM will continue to f/u with needs.     08/11/17 1558   Patient Type   Within 30 Days of Previous Admission? No   Healthcare Decisions   Interviewed: Patient   Orientation/Decision Making Abilities of Patient Alert and Oriented x3, able to make decisions   Advance Directive Patient does not have advance directive   Healthcare Agent Appointed Other (Comment)  Miyo Aina, father, [phone])   Healthcare Agent's Name n/a   Healthcare Agent's Phone Number n/a   Additional Emergency Contacts? n/a   Prior to admission   Prior level of function Independent with ADLs;Ambulates independently   Type of Residence Private residence   Home Layout One level   Have running water, electricity, heat, etc? Yes   Living Arrangements Other (Comment)  (roommate)   How do you get to your MD appointments? drives   How do you get your groceries? drives   Who fixes your meals? pt   Who does your laundry? pt   Who picks up your prescriptions? pt   Dressing Independent   Grooming Independent   Feeding Independent   Bathing Independent   Toileting Independent   DME Currently at Home (none)   Home Care/Community Services None   Prior SNF admission? (Detail) none   Prior Rehab admission? (Detail) none   Adult Protective Services (APS) involved? No   Discharge Planning   Support Systems Parent   Patient expects to be discharged to: home   Anticipated [place] plan discussed with: Same as interviewed   Potential barriers to discharge: (none)   Mode of transportation: Private car (friend)   Consults/Providers   Correct PCP listed in Epic? Yes   Important Message from Medicare Notice   Patient received 1st IMM Letter? n/a   Janie Morning  Case Management  [phone]

## 2017-08-11 NOTE — UM Notes (Signed)
AETNA/AETNA PPO   AETNA/INNOV HLTH SELF INSURED   08/11/17 0327  Place (admit) for Observation Services (Adult Observation Admit Panel ([place])) Once         31 y.o. female with a PMHx of Crohn's disease not currently on treatment, Hypothyroidism due to Hashimoto's thyroiditis, anxiety, depression who presented with right flank pain and dysuria.  Patient has a history of frequent urinary tract infections occurring every 2-3 months.  Her last urinary tract infection was in May and was treated with Keflex.  Patient states that she had onset of right flank pain 3 days ago associated with nausea, vomiting, fever to 100.4, dysuria, frequency, and urgency.  She denies any headache, rash, chest pain, shortness of breath, or hematuria.  Of note, patient was previously treated with Humira for Crohn's disease, but discontinued 8 months ago.  She recently moved here from [place] and works as a Engineer, civil (consulting) at [employer].    Vitals     98.8 F (37.1 C)  --   104  18  112/66  98 %       ASSESSMENT & PLAN     [name] is a 31 y.o. female with a history of Hypothyroidism and mood disorder presenting with Pyelonephritis.    # Pyelonephritis.  -Continue empiric antibiotics with Levaquin.  -Follow-up cultures.  -Pain control with Toradol and Percocet.  -Tylenol as needed for fever.  -Zofran and Phenergan as needed for nausea.    #Hypothyroidism.  -Continue Synthroid.  -Check thyroid-stimulating hormone.    #Anxiety/depression.  -Continue Prozac    #Crohns Disease  -Continue to monitor for signs of diarrhea    # Nutrition  Regular diet as tolerated    # VTE Prophylaxis  SCDs, ambulate    # CODE STATUS: Full code    Anticipated medical stability for discharge: 2-3 days    Service status/Reason for ongoing hospitalization: Pyelonephritis  Anticipated Discharge Needs: None    Labs  Urine culture [[id number]] Collected: 08/11/17 0206   Specimen: Urine from Urine, Clean Catch Updated: 08/11/17 0457   Narrative:     Replace urinary catheter prior to obtaining the urine culture  if it has been in place for greater than or equal to 14  days:->No  Indications for Urine Culture:->Costovertebral Angle  Tenderness   Hemolysis index [[id number]] Collected: 08/10/17 2240    Updated: 08/10/17 2301    Hemolysis Index 18   GFR [[id number]] Collected: 08/10/17 2240    Updated: 08/10/17 2301    EGFR >60.0   Comprehensive metabolic panel [[id number]] (Abnormal) Collected: 08/10/17 2240   Specimen: Blood Updated: 08/10/17 2301    Glucose 95 mg/dL     BUN 16.6 mg/dL     Creatinine 0.9 mg/dL     Sodium 063 mEq/L     Potassium 4.1 mEq/L     Chloride 104 mEq/L     CO2 26 mEq/L     Calcium 9.9 mg/dL     Protein, Total 8.6 (H) g/dL     Albumin 4.1 g/dL     AST (SGOT) 27 U/L     ALT 18 U/L     Alkaline Phosphatase 122 (H) U/L     Bilirubin, Total 0.5 mg/dL     Globulin 4.5 (H) g/dL     Albumin/Globulin Ratio 0.9    Anion Gap 9.0   Lipase [[id number]] Collected: 08/10/17 2240   Specimen: Blood Updated: 08/10/17 2301    Lipase 9 U/L    UA, Reflex to Microscopic (  pts 3 + yrs) [[id number]] (Abnormal) Collected: 08/10/17 2240   Specimen: Urine Updated: 08/10/17 2254    Urine Type Clean Catch    Color, UA Yellow    Clarity, UA Sl Cloudy (A)    Specific Gravity UA 1.024    Urine pH 7.0    Leukocyte Esterase, UA Large (A)    Nitrite, UA Negative    Protein, UR 100 (A)    Glucose, UA Negative    Ketones UA Negative    Urobilinogen, UA Negative mg/dL     Bilirubin, UA Negative    Blood, UA Negative    RBC, UA 11 - 25 (A) /hpf     WBC, UA TNTC (A) /hpf     Squamous Epithelial Cells, Urine 11 - 25 /hpf    CBC with differential [[id number]] (Abnormal) Collected: 08/10/17 2240   Specimen: Blood from Blood Updated: 08/10/17 2249    WBC 10.02 x10 3/uL     Hgb 13.2 g/dL     Hematocrit 64.4 %     Platelets 483 (H) x10 3/uL     RBC 4.47 x10 6/uL     MCV 91.3 fL     MCH 29.5 pg     MCHC 32.4 g/dL     RDW 15 %     MPV 8.9 (L) fL     Neutrophils 61.3 %     Lymphocytes  Automated 28.4 %     Monocytes 8.6 %     Eosinophils Automated 0.8 %     Basophils Automated 0.5 %     Immature Granulocyte 0.4 %     Nucleated RBC 0.0 /100 WBC     Neutrophils Absolute 6.14 x10 3/uL     Abs Lymph Automated 2.85 x10 3/uL     Abs Mono Automated 0.86 x10 3/uL     Abs Eos Automated 0.08 x10 3/uL     Absolute Baso Automated 0.05 x10 3/uL     Absolute Immature Granulocyte 0.04 x10 3/uL     Absolute NRBC 0.00 x10 3/uL    Urine BHCG POC [[id number]] Collected: 08/10/17 2239    Updated: 08/10/17 2245    Urine bHCG POC Negative         Morna Flud K. Coral Ceo, RN, BSN, MSN, MBA  UR Case Manager  Please Email if you have any questions  [email or web address]

## 2017-08-11 NOTE — Progress Notes (Addendum)
Pt's new address is [address]. [place], [place], [place] [address]    Janie Morning  Case Management  [phone]

## 2017-08-11 NOTE — H&P (Signed)
[facility]      Patient: [name]  Date: 08/10/2017   DOB: 01-25-1986  Admission Date: 08/10/2017   MRN: [id number]  Attending: Oneida Alar         Chief Complaint   Patient presents with   . Flank Pain      History Gathered From: Patient    HISTORY AND PHYSICAL     [name] is a 31 y.o. female with a PMHx of Crohn's disease not currently on treatment, Hypothyroidism due to Hashimoto's thyroiditis, anxiety, depression who presented with right flank pain and dysuria.  Patient has a history of frequent urinary tract infections occurring every 2-3 months.  Her last urinary tract infection was in May and was treated with Keflex.  Patient states that she had onset of right flank pain 3 days ago associated with nausea, vomiting, fever to 100.4, dysuria, frequency, and urgency.  She denies any headache, rash, chest pain, shortness of breath, or hematuria.  Of note, patient was previously treated with Humira for Crohn's disease, but discontinued 8 months ago.  She recently moved here from [place] and works as a Engineer, civil (consulting) at [employer].    Past Medical History:   Diagnosis Date   . Abortion    . Anxiety    . Crohn's disease    . Depression    . H/O Hashimoto thyroiditis     hypothyroidism   . PCOS (polycystic ovarian syndrome)    . Tachycardia        Past Surgical History:   Procedure Laterality Date   . BREAST IMPLANT     . COLONOSCOPY     . D & C, SUCTION N/A 08/24/2014    Procedure: D & C, SUCTION;  Surgeon: Maryruth Hancock, MD;  Location: [facility];  Service: Obstetrics;  Laterality: N/A;   . INDUCED ABORTION         Prior to Admission medications    Medication Sig Start Date End Date Taking? Authorizing Provider   FLUoxetine (PROZAC) 40 MG capsule Take 40 mg by mouth daily. 04/08/17   [provider]   ibuprofen (ADVIL,MOTRIN) 200 MG tablet Take 400 mg by mouth every 6 (six) hours as needed for Pain.    [provider]   levothyroxine (SYNTHROID,  LEVOTHROID) 88 MCG tablet Take 88 mcg by mouth daily.    [provider]   ondansetron (ZOFRAN) 8 MG tablet Take 1 tablet (8 mg total) by mouth every 6 (six) hours as needed for Nausea. 07/18/17   Derrill Kay, MD       Allergies   Allergen Reactions   . Bentyl [Dicyclomine] Hives   . Bactrim [Sulfamethoxazole W/Trimethoprim (Co-Trimoxazole)] Rash   . Sulfa Antibiotics Rash       PRIMARY CARE MD: Vivia Ewing, NP    History reviewed. No pertinent family history.    Social History   Substance Use Topics   . Smoking status: Never Smoker   . Smokeless tobacco: Never Used   . Alcohol use No      Comment: rare       REVIEW OF SYSTEMS   12-Point Review of Systems completed and was otherwise negative except for as noted in HPI.    PHYSICAL EXAM   Vital Signs (most recent): BP 114/75   Pulse 88   Temp 98.4 F (36.9 C)   Resp 18   SpO2 97%     Constitutional: No apparent distress.  HEENT: NC/AT, PERRL, no scleral icterus or conjunctival pallor, MMM, oropharynx without erythema or exudate  Neck: supple, no cervical or supraclavicular lymphadenopathy or masses  Cardiovascular: RRR, normal S1 S2, no murmurs, gallops, no JVD, Non-displaced PMI.  Respiratory: Normal rate. No increased work of breathing. Clear to auscultation and percussion bilaterally.   Gastrointestinal: +BS, non-distended, soft, non-tender, no rebound or guarding, no hepatosplenomegaly  Genitourinary: mild suprapubic and R costovertebral angle tenderness  Musculoskeletal: ROM and motor strength grossly normal. No clubbing, edema, or cyanosis. DP and radial pulses 2+ and symmetric.  Neurologic: EOMI, CN 2-12 grossly intact. no gross motor or sensory deficits  Psychiatric: AAOx3, affect and mood appropriate. The patient is alert, interactive, appropriate.    Lines/Drains/Airways:  Patient Lines/Drains/Airways Status    Active Lines, Drains and Airways     Name:   Placement date:   Placement time:   Site:   Days:    Peripheral IV  08/10/17 Left Hand  08/10/17    2245    Hand    less than 1                Exam done by Oneida Alar, MD on 08/11/17 at 3:35 AM      LABS & IMAGING     Recent Labs      08/10/17   2240   WBC  10.02   Hgb  13.2   Hematocrit  40.8   Platelets  483*     No results for input(s): PT, INR, PTT in the last 72 hours.   Recent Labs  Lab 08/10/17  2240   Sodium 139   Potassium 4.1   Chloride 104   CO2 26   BUN 16.0   Creatinine 0.9   Calcium 9.9   Albumin 4.1   Protein, Total 8.6*   Bilirubin, Total 0.5   Alkaline Phosphatase 122*   ALT 18   AST (SGOT) 27   Glucose 95             Microbiology:   Microbiology Results     Procedure Component Value Units Date/Time    Urine culture [[id number]] Collected:  08/11/17 0206    Specimen:  Urine from Urine, Clean Catch Updated:  08/11/17 0206    Narrative:       Replace urinary catheter prior to obtaining the urine culture  if it has been in place for greater than or equal to 14  days:->No  Indications for Urine Culture:->Costovertebral Angle  Tenderness          Imaging:  US Kidney/ Bladder    Result Date: 08/11/2017  1. Normal appearance of the kidneys. [name], MD 08/11/2017 2:29 AM    Ct Abdomen Pelvis W Iv And Po Cont    Result Date: 07/18/2017  IMPRESSION: No acute intra-abdominal abnormalities. ReadingStation:[id number]    US Pelvis With Transvaginal    Result Date: 07/18/2017  IMPRESSION: Normal pelvic ultrasound examination. ReadingStation:[id number]        EMERGENCY DEPARTMENT COURSE:  Orders Placed This Encounter   Procedures   . Urine culture   . US Kidney/ Bladder   . UA, Reflex to Microscopic (pts 3 + yrs)   . CBC with differential   . Comprehensive metabolic panel   . Lipase   . Hemolysis index   . GFR   . Urine BHCG POC   . Urine bHCG POCT   . Vital Signs-Repeat   . ED Unit Sec Comm Order   .  Saline lock IV   . Place (admit) for Observation Services   . [facility] ED Bed Request (Observation)       ASSESSMENT & PLAN     [name] is a 31 y.o.  female with a history of Hypothyroidism and mood disorder presenting with Pyelonephritis.    # Pyelonephritis.  -Continue empiric antibiotics with Levaquin.  -Follow-up cultures.  -Pain control with Toradol and Percocet.  -Tylenol as needed for fever.  -Zofran and Phenergan as needed for nausea.    #Hypothyroidism.  -Continue Synthroid.  -Check thyroid-stimulating hormone.    #Anxiety/depression.  -Continue Prozac    #Crohns Disease  -Continue to monitor for signs of diarrhea    # Nutrition  Regular diet as tolerated    # VTE Prophylaxis  SCDs, ambulate    # CODE STATUS: Full code    Anticipated medical stability for discharge: 2-3 days    Service status/Reason for ongoing hospitalization: Pyelonephritis  Anticipated Discharge Needs: None    Signed,  Oneida Alar    08/11/2017 3:37 AM  Time Elapsed: 45 minutes

## 2017-08-11 NOTE — Plan of Care (Signed)
[name] is 31 years old female who was admitted overnight with pyelonephritis, patient was admitted, started on antibiotic  Levaquin.     Patient seen and examined independently by the writer.     Assessment and plan     Pyelonephritis   IV hydration   IV antibiotics   Probiotics   ID consult   Check urine culture       Jeannine Kitten T Lundyn Coste   9:11 AM

## 2017-08-12 LAB — CBC
Absolute NRBC: 0 10*3/uL
Hematocrit: 35.9 % — ABNORMAL LOW (ref 37.0–47.0)
Hgb: 11.1 g/dL — ABNORMAL LOW (ref 12.0–16.0)
MCH: 29.4 pg (ref 28.0–32.0)
MCHC: 30.9 g/dL — ABNORMAL LOW (ref 32.0–36.0)
MCV: 95 fL (ref 80.0–100.0)
MPV: 9.1 fL — ABNORMAL LOW (ref 9.4–12.3)
Nucleated RBC: 0 /100 WBC (ref 0.0–1.0)
Platelets: 384 10*3/uL (ref 140–400)
RBC: 3.78 10*6/uL — ABNORMAL LOW (ref 4.20–5.40)
RDW: 14 % (ref 12–15)
WBC: 10.81 10*3/uL — ABNORMAL HIGH (ref 3.50–10.80)

## 2017-08-12 LAB — BASIC METABOLIC PANEL
Anion Gap: 6 (ref 5.0–15.0)
BUN: 11 mg/dL (ref 7.0–19.0)
CO2: 22 mEq/L (ref 22–29)
Calcium: 9 mg/dL (ref 8.5–10.5)
Chloride: 106 mEq/L (ref 100–111)
Creatinine: 0.8 mg/dL (ref 0.6–1.0)
Glucose: 92 mg/dL (ref 70–100)
Potassium: 4.3 mEq/L (ref 3.5–5.1)
Sodium: 134 mEq/L — ABNORMAL LOW (ref 136–145)

## 2017-08-12 LAB — HEPATIC FUNCTION PANEL
ALT: 23 U/L (ref 0–55)
AST (SGOT): 44 U/L — ABNORMAL HIGH (ref 5–34)
Albumin/Globulin Ratio: 0.9 (ref 0.9–2.2)
Albumin: 3.4 g/dL — ABNORMAL LOW (ref 3.5–5.0)
Alkaline Phosphatase: 100 U/L (ref 37–106)
Bilirubin Direct: 0.1 mg/dL (ref 0.0–0.5)
Bilirubin Indirect: 0.2 mg/dL (ref 0.0–1.0)
Bilirubin, Total: 0.3 mg/dL (ref 0.2–1.2)
Globulin: 3.6 g/dL (ref 2.0–3.6)
Protein, Total: 7 g/dL (ref 6.0–8.3)

## 2017-08-12 LAB — GFR: EGFR: >60

## 2017-08-12 LAB — HEMOLYSIS INDEX: Hemolysis Index: 7 (ref 0–18)

## 2017-08-12 MED ORDER — CEFUROXIME AXETIL 250 MG PO TABS
250.0000 mg | ORAL_TABLET | Freq: Two times a day (BID) | ORAL | 0 refills | Status: AC
Start: 2017-08-12 — End: 2017-08-19

## 2017-08-12 MED ORDER — ONDANSETRON 4 MG PO TBDP
4.0000 mg | ORAL_TABLET | Freq: Three times a day (TID) | ORAL | 0 refills | Status: AC | PRN
Start: 2017-08-12 — End: 2017-08-22

## 2017-08-12 MED ORDER — OXYCODONE-ACETAMINOPHEN 5-325 MG PO TABS
1.0000 | ORAL_TABLET | ORAL | 0 refills | Status: AC | PRN
Start: 2017-08-12 — End: 2017-08-17

## 2017-08-12 MED ORDER — RISAQUAD PO CAPS
1.0000 | ORAL_CAPSULE | Freq: Every day | ORAL | 0 refills | Status: AC
Start: 2017-08-12 — End: 2017-08-19

## 2017-08-12 NOTE — Plan of Care (Signed)
Problem: Pain  Goal: Pain at adequate level as identified by patient  Outcome: Progressing   08/12/17 0244   Goal/Interventions addressed this shift   Pain at adequate level as identified by patient Identify patient comfort function goal;Assess for risk of opioid induced respiratory depression, including snoring/sleep apnea. Alert healthcare team of risk factors identified.;Assess pain on admission, during daily assessment and/or before any "as needed" intervention(s);Reassess pain within 30-60 minutes of any procedure/intervention, per Pain Assessment, Intervention, Reassessment (AIR) Cycle;Evaluate if patient comfort function goal is met;Evaluate patient's satisfaction with pain management progress;Offer non-pharmacological pain management interventions   Pt's pain managed with PRN's as ordered. Will cont to assess and reassess pain and medicate appropriately.    Problem: Bladder/Voiding  Goal: Free from infection  Outcome: Progressing   08/12/17 0244   Goal/Interventions addressed this shift   Free from infection Monitor/assess for signs and symptoms of infection;Assess need for indwelling catheter every shift and discuss with LIP   Pt voiding without c/o burns or irritation.     Problem: Infection  Goal: Free from infection  Outcome: Progressing   08/12/17 0244   OTHER   Free from infection  Assess for signs/symptoms of infection;Utilize sepsis protocol       Comments: Safety precautions maintained. Purposeful 2 hourly rounding. Will cont with POC

## 2017-08-12 NOTE — Discharge Instr - Diet (Signed)
regular

## 2017-08-12 NOTE — Discharge Instructions (Signed)
Urinary Tract Infections in Women    Urinary tract infections (UTIs) are most often caused bybacteria. These bacteria enter the urinary tract. The bacteria may come from outside the body. Or they may travel from the skin outside therectum or vagina into the urethra. Female anatomy makes it easy for bacteria from the bowelto enter a woman's urinary tract, which is the most common source of UTI. This means women develop UTIs more often than men. Pain in or around the urinary tract is a common UTI symptom. But the only way to know for sure if you have a UTI for the healthcare provider to test your urine. The two tests that may be done are the urinalysis and urine culture.  Types of UTIs   Cystitis. A bladder infection (cystitis) is the most common UTI in women. You may have urgent or frequent urination. You may also havepain, burning when you urinate, and bloody urine.   Urethritis. This is an inflamed urethra, which is the tube that carries urine from the bladder to outside the body. You may have lower stomach or back pain. You may also have urgent or frequent urination.   Pyelonephritis. This is a kidney infection. If not treated, it can be serious and damage your kidneys. In severe cases, you may need to stay in thehospital. You may have a fever and lower back pain.  Medicines to treat a UTI  Most UTIs are treated with antibiotics. These kill the bacteria. The length of time you need to take them depends on the type of infection. It may be as short as 3 days. If you have repeated UTIs, you may need a low-dose antibioticfor several months. Take antibiotics exactly as directed. Don't stop taking them until all of the medicine is gone. If you stop taking the antibiotic too soon, the infection may not go away. You may also develop a resistance to the antibiotic. This can make it much harder to treat.  Lifestyle changes to treat and prevent UTIs  The lifestyle changes below will help get rid of your UTI. They  may also help prevent future UTIs.   Drink plenty of fluids. This includes water, juice, or other caffeine-free drinks. Fluids help flush bacteria out of your body.   Empty your bladder. Always empty your bladder when you feel the urge to urinate. And always urinate before going to sleep. Urine that stays in your bladder can lead to infection. Try to urinate before and after sex as well.   Practice good personal hygiene. Wipe yourself from front to back after using the toilet. This helps keep bacteria from getting into the urethra.   Use condoms during sex. These help prevent UTIs caused by sexually transmitted bacteria. Also don't use spermicides during sex. These can increase the risk for UTIs. Choose other forms of birth control instead. For women who tend to get UTIs after sex, a low-dose of a preventive antibiotic may be used. Be sure to discuss this option with your healthcare provider.   Follow up with your healthcare provider as directed. He or she may test to make sure the infection has cleared. If needed, more treatment may be started.  Date Last Reviewed: 12/30/2015   2000-2017 [employer], [place]. [address] [place], [place] [address]. All rights reserved. This information is not intended as a substitute for professional medical care. Always follow your healthcare professional's instructions.      Cefuroxime tablets  Brand Names: Alti-Cefuroxime, Ceftin  What is this  medicine?  CEFUROXIME (se fyoor OX eem) is a cephalosporin antibiotic. It is used to treat certain kinds of bacterial infections. It will not work for colds, flu, or other viral infections.  How should I use this medicine?  Take this medicine by mouth with a full glass of water. Follow the directions on the prescription label. Do not crush or chew. This medicine works best if you take it with food. Take your medicine at regular intervals. Do not take your medicine more often than directed. Take all of your medicine as directed  even if you think your are better. Do not skip doses or stop your medicine early.  Talk to your pediatrician regarding the use of this medicine in children. Special care may be needed. While this drug may be prescribed for children as young as 80 months of age for selected conditions, precautions do apply.  What side effects may I notice from receiving this medicine?  Side effects that you should report to your doctor or health care professional as soon as possible:   allergic reactions like skin rash, itching or hives, swelling of the face, lips, or tongue   dark urine   difficulty breathing   fever   irregular heartbeat or chest pain   redness, blistering, peeling or loosening of the skin, including inside the mouth   seizures   unusual bleeding or bruising   unusually weak or tired   white patches or sores in the mouth  Side effects that usually do not require medical attention (report to your doctor or health care professional if they continue or are bothersome):   diarrhea   gas or heartburn   headache   nausea, vomiting   vaginal itching  What may interact with this medicine?  This medicine may interact with the following medications:   antacids   birth control pills   certain medicines for infection like amikacin, gentamicin, tobramycin   diuretics   probenecid   warfarin  What if I miss a dose?  If you miss a dose, take it as soon as you can. If it is almost time for your next dose, take only that dose. Do not take double or extra doses.  Where should I keep my medicine?  Keep out of the reach of children.  Store at room temperature between 15 and 30 degrees C (59 and 86 degrees F). Keep container tightly closed. Protect from moisture. Throw away any unused medicine after the expiration date.  What should I tell my health care provider before I take this medicine?  They need to know if you have any of these conditions:   bleeding problems   bowel disease, like colitis   kidney  disease   liver disease   an unusual or allergic reaction to cefuroxime, other antibiotics or medicines, foods, dyes or preservatives   pregnant or trying to get pregnant   breast-feeding  What should I watch for while using this medicine?  Tell your doctor or health care professional if your symptoms do not improve or if you get new symptoms.  Do not treat diarrhea with over the counter products. Contact your doctor if you have diarrhea that lasts more than 2 days or if it is severe and watery.  This medicine can interfere with some urine glucose tests. If you use such tests, talk with your health care professional.  If you are being treated for a sexually transmitted disease, avoid sexual contact until you have finished your treatment.  Your sexual partner may also need treatment.  NOTE:This sheet is a summary. It may not cover all possible information. If you have questions about this medicine, talk to your doctor, pharmacist, or health care provider. Copyright 2018 [employer]

## 2017-08-12 NOTE — Progress Notes (Signed)
Infectious Diseases & Tropical Medicine  Progress Note    08/12/2017   [name] [id number] is a 31 y.o. female,       Assessment:      Urinalysis consistent with UTI.   Urine culture no growth   Renal ultrasound-no stones/obstruction   History of Crohn's disease-not on Humira since last 8 months   Hypothyroidism   History of recurrent UTIs.   No fever or leukocytosis.   Clinically improved    Plan:      Okay to discharge from infectious disease point of view.   Ceftin 7 days.   Continue probiotics       ROS:     General:  no fever, no chills, no rigor, awake and alert,comfortable  HEENT: no neck pain, no throat pain  Endocrine:  no fatigue, no night sweats  Respiratory: no cough, shortness of breath, or wheezing   Cardiovascular: no chest pain   Gastrointestinal: Complains of right flank and back pain,no N/V/D  Genito-Urinary: no dysuria, increased urinary frequency, trouble voiding, or hematuria   Musculoskeletal: no edema  Neurological: c/o generalized weakness   Dermatological: no rash, no ulcer    Physical Examination:     Blood pressure 115/72, pulse 88, temperature 99.3 F (37.4 C), temperature source Oral, resp. rate 18, height 1.549 m (5\' 1" ), weight 53.3 kg (117 lb 9.6 oz), last menstrual period 08/04/2017, SpO2 95 %, currently breastfeeding.     General Appearance: Comfortable, and in no acute distress.   HEENT: Pupils are equal, round, and reactive to light.    Lungs:  Clear to auscultation   Heart:  Regular rate and rhythm   Chest: Symmetric chest wall expansion.    Abdomen: soft, right CVA tenderness,no hepatosplenomegaly,good bowel sounds   Neurological: No focal deficit   Extremities: No edema    Laboratory And Diagnostic Studies:     Recent Labs      08/12/17   0446  08/10/17   2240   WBC  10.81*  10.02   Hgb  11.1*  13.2   Hematocrit  35.9*  40.8   Platelets  384  483*     Recent Labs      08/12/17   0446  08/10/17   2240   Sodium  134*  139   Potassium   4.3  4.1   Chloride  106  104   CO2  22  26   BUN  11.0  16.0   Creatinine  0.8  0.9   Glucose  92  95   Calcium  9.0  9.9     Recent Labs      08/12/17   0446  08/10/17   2240   AST (SGOT)  44*  27   ALT  23  18   Alkaline Phosphatase  100  122*   Protein, Total  7.0  8.6*   Albumin  3.4*  4.1       Current Meds:      Scheduled Meds: PRN Meds:      cefTRIAXone 2 g Intravenous Q24H   FLUoxetine 40 mg Oral Daily   lactobacillus/streptococcus 1 capsule Oral Daily   levothyroxine 88 mcg Oral Daily       Continuous Infusions:  . sodium chloride 150 mL/hr at 08/12/17 0509      acetaminophen 650 mg Q4H PRN   Or     acetaminophen 650 mg Q4H PRN   ketorolac 15 mg Q6H PRN   morphine  2 mg Q4H PRN   ondansetron 4 mg Q6H PRN   Or     ondansetron 4 mg Q6H PRN   oxyCODONE-acetaminophen 2 tablet Q4H PRN   promethazine 25 mg Q6H PRN         [name] A. Janalyn Rouse, M.D.  08/12/2017  10:17 AM

## 2017-08-12 NOTE — Progress Notes (Signed)
08/12/17 2956   Discharge Disposition   Patient preference/choice provided? N/A   Physical Discharge Disposition Home, No Needs   Mode of Transportation Car   Patient/Family/POA notified of transfer plan Yes   Patient agreeable to discharge plan/expected d/c date? Yes   Bedside nurse notified of transport plan? Yes   CM Interventions   Follow up appointment scheduled? No   Reason no follow up scheduled? Patient declined   Referral made for home health RN visit? No, Other (comment)   Multidisciplinary rounds/family meeting before d/c? Yes   Medicare Checklist   Is this a Medicare patient? No   Patient received 1st IMM Letter? n/a   Janie Morning  Case Management  [phone]

## 2017-08-12 NOTE — Discharge Summary (Signed)
Discharge Summary    Date:08/12/2017   Patient Name: [name]  Attending Physician: Jolyn Lent, MD    Date of Admission:   08/10/2017    Date of Discharge:   08/12/17    Admitting Diagnosis:   Rt flank pain due to pyelonephritis     Discharge Dx:     Principal Diagnosis (Diagnosis after study, that is chiefly responsible for admission to inpatient status): Pyelonephritis  Active Hospital Problems    Diagnosis POA   . Principal Problem: Pyelonephritis Yes      Resolved Hospital Problems    Diagnosis POA   No resolved problems to display.       Treatment Team:   Treatment Team:   Attending Provider: Jolyn Lent, MD  Consulting Physician: Tenna Child, MD     Procedures performed:   Radiology: all results from this admission  US Kidney/ Bladder    Result Date: 08/11/2017  1. Normal appearance of the kidneys. Allison Guest, MD 08/11/2017 2:29 AM    Ct Abdomen Pelvis W Iv And Po Cont    Result Date: 07/18/2017  IMPRESSION: No acute intra-abdominal abnormalities. ReadingStation:[id number]    US Pelvis With Transvaginal    Result Date: 07/18/2017  IMPRESSION: Normal pelvic ultrasound examination. ReadingStation:[id number]      Reason for Admission:   Rt sided pyelonephritis     Hospital Course:     31 years old female admitted with rt sided pyelonephritis, started on antibiotics, IV hydration, painmedication, urine culture no bacterial growth, she will be discharged on 7 days of ceftin in addition to pain management and probiotics     Condition at Discharge:   Stable     Today:     BP 115/72   Pulse 88   Temp 99.3 F (37.4 C) (Oral)   Resp 18   Ht 1.549 m (5\' 1" )   Wt 53.3 kg (117 lb 9.6 oz)   LMP 08/04/2017 (Exact Date)   SpO2 95%   BMI 22.22 kg/m   Ranges for the last 24 hours:  Temp:  [97.4 F (36.3 C)-99.3 F (37.4 C)] 99.3 F (37.4 C)  Heart Rate:  [83-93] 88  Resp Rate:  [16-18] 18  BP: (100-115)/(62-72) 115/72    Last set of labs     Recent Labs  Lab 08/12/17  0446    WBC 10.81*   Hgb 11.1*   Hematocrit 35.9*   Platelets 384       Recent Labs  Lab 08/12/17  0446   Sodium 134*   Potassium 4.3   Chloride 106   CO2 22   BUN 11.0   Creatinine 0.8   EGFR >60.0   Glucose 92   Calcium 9.0       Recent Labs  Lab 08/12/17  0446   Bilirubin, Total 0.3   Bilirubin, Direct 0.1   Protein, Total 7.0   Albumin 3.4*   ALT 23   AST (SGOT) 44*       Micro / Labs / Path pending:     Unresulted Labs     None                Discharge Instructions:     Follow-up Information     [name], Allison Paddy, NP. Schedule an appointment as soon as possible for a visit in 1 week(s).    Specialty:  Nurse Practitioner  Why:  for after discharge follow up  Contact information:  [address]  [place] [address]  [phone]                   Discharge Diet: Regular Diet     Disposition:  Home or Self Care     Discharge Medication List      Taking    cefuroxime 250 MG tablet  Dose:  250 mg  Commonly known as:  CEFTIN  Take 1 tablet (250 mg total) by mouth 2 (two) times daily.for 7 days     FLUoxetine 40 MG capsule  Dose:  40 mg  Commonly known as:  PROzac  Take 40 mg by mouth daily.     ibuprofen 200 MG tablet  Dose:  400 mg  Commonly known as:  ADVIL,MOTRIN  Take 400 mg by mouth every 6 (six) hours as needed for Pain.     lactobacillus/streptococcus Caps  Dose:  1 capsule  Take 1 capsule by mouth daily.for 7 days     levothyroxine 88 MCG tablet  Dose:  88 mcg  Commonly known as:  SYNTHROID, LEVOTHROID  Take 88 mcg by mouth daily.     ondansetron 4 MG disintegrating tablet  Dose:  4 mg  Commonly known as:  ZOFRAN-ODT  Take 1 tablet (4 mg total) by mouth every 8 (eight) hours as needed for Nausea.for up to 10 days     oxyCODONE-acetaminophen 5-325 MG per tablet  Dose:  1 tablet  Commonly known as:  PERCOCET  Take 1 tablet by mouth every 4 (four) hours as needed for Pain.for up to 5 days        STOP taking these medications    ondansetron 8 MG tablet  Commonly known as:  ZOFRAN          Minutes spent  coordinating discharge and reviewing discharge plan: 40 minutes      Signed by: Jolyn Lent, MD

## 2017-08-12 NOTE — Discharge Instr - Activity (Signed)
As tolerated

## 2017-08-12 NOTE — Progress Notes (Signed)
Pt got discharged to home, IV was removed, discharge instruction was given, verbalized the understanding of the given instruction. Was taken downstairs to the entrance by the transporter

## 2017-08-12 NOTE — Discharge Instr - AVS First Page (Signed)
[facility] DISCHARGE INSTRUCTIONS     Date of Admission: 08/10/2017    Date of Discharge: 08/12/2017    Discharge Physician: Jolyn Lent    Dear [name],     Thank you for choosing [facility] for your emergency care needs. We strive to provide EXCELLENT care to you and your family.     In an effort to explain clearly why you were here in the hospital, I've written a very brief summary. I hope that you find it useful. Other details including formal diagnosis, medication changes, follow up appointment recommendations, and access to MyChart for formal medical records can be found in this packet.       You were admitted for Pyelonephritis for which you were started on antibiotics.. Make sure to follow up with our  Discharge Clinic or Vivia Ewing, NP your primary care doctor for follow-up. I cannot stress the importance of follow up enough.    Make sure to bring the following to your doctors appointments:  Medications in their original bottles  Glucometer/blood sugar log (if diabetic)   Weight log (if you have heart failure)    If you are unable to obtain an appointment, unable to obtain newly prescribed medications, or are unclear about any of your discharge instructions please contact me at [phone] (M-F, 8am-3pm) or weekends and after hours via the hospital operator [phone]) [phone], the hospital case manager, or your primary care physician.    Finally, as your discharging physician, you may be receiving a survey which is regarding my care. I would greatly value and appreciate your feedback as I strive for excellence.     Respectfully yours,    Jolyn Lent

## 2018-01-15 ENCOUNTER — Emergency Department
Admission: EM | Admit: 2018-01-15 | Discharge: 2018-01-15 | Disposition: A | Discharge status: Home / Self Care | Payer: No Typology Code available for payment source | Attending: Emergency Medicine | Admitting: Emergency Medicine

## 2018-01-15 ENCOUNTER — Telehealth (HOSPITAL_BASED_OUTPATIENT_CLINIC_OR_DEPARTMENT_OTHER): Payer: Self-pay

## 2018-01-15 DIAGNOSIS — F10230 Alcohol dependence with withdrawal, uncomplicated: Secondary | ICD-10-CM | POA: Insufficient documentation

## 2018-01-15 DIAGNOSIS — E063 Autoimmune thyroiditis: Secondary | ICD-10-CM | POA: Insufficient documentation

## 2018-01-15 DIAGNOSIS — F1020 Alcohol dependence, uncomplicated: Secondary | ICD-10-CM

## 2018-01-15 DIAGNOSIS — Z87891 Personal history of nicotine dependence: Secondary | ICD-10-CM | POA: Insufficient documentation

## 2018-01-15 DIAGNOSIS — F10930 Alcohol use, unspecified with withdrawal, uncomplicated: Secondary | ICD-10-CM

## 2018-01-15 DIAGNOSIS — K5090 Crohn's disease, unspecified, without complications: Secondary | ICD-10-CM | POA: Insufficient documentation

## 2018-01-15 DIAGNOSIS — Z79899 Other long term (current) drug therapy: Secondary | ICD-10-CM | POA: Insufficient documentation

## 2018-01-15 HISTORY — DX: Alcohol abuse, uncomplicated: F10.10

## 2018-01-15 LAB — COMPREHENSIVE METABOLIC PANEL
ALT: 20 U/L (ref 0–55)
AST (SGOT): 42 U/L — ABNORMAL HIGH (ref 5–34)
Albumin/Globulin Ratio: 0.8 — ABNORMAL LOW (ref 0.9–2.2)
Albumin: 4.1 g/dL (ref 3.5–5.0)
Alkaline Phosphatase: 110 U/L — ABNORMAL HIGH (ref 37–106)
Anion Gap: 12 (ref 5.0–15.0)
BUN: 7 mg/dL (ref 7.0–19.0)
Bilirubin, Total: 0.2 mg/dL (ref 0.2–1.2)
CO2: 24 mEq/L (ref 22–29)
Calcium: 9.5 mg/dL (ref 8.5–10.5)
Chloride: 100 mEq/L (ref 100–111)
Creatinine: 0.8 mg/dL (ref 0.6–1.0)
Globulin: 4.9 g/dL — ABNORMAL HIGH (ref 2.0–3.6)
Glucose: 102 mg/dL — ABNORMAL HIGH (ref 70–100)
Potassium: 4.4 mEq/L (ref 3.5–5.1)
Protein, Total: 9 g/dL — ABNORMAL HIGH (ref 6.0–8.3)
Sodium: 136 mEq/L (ref 136–145)

## 2018-01-15 LAB — CBC AND DIFFERENTIAL
Absolute NRBC: 0 10*3/uL
Basophils Absolute Automated: 0.04 10*3/uL (ref 0.00–0.20)
Basophils Automated: 0.5 %
Eosinophils Absolute Automated: 0.01 10*3/uL (ref 0.00–0.70)
Eosinophils Automated: 0.1 %
Hematocrit: 42.8 % (ref 37.0–47.0)
Hgb: 13.9 g/dL (ref 12.0–16.0)
Immature Granulocytes Absolute: 0.02 10*3/uL
Immature Granulocytes: 0.2 %
Lymphocytes Absolute Automated: 2.88 10*3/uL (ref 0.50–4.40)
Lymphocytes Automated: 33.2 %
MCH: 29.6 pg (ref 28.0–32.0)
MCHC: 32.5 g/dL (ref 32.0–36.0)
MCV: 91.3 fL (ref 80.0–100.0)
MPV: 9.2 fL — ABNORMAL LOW (ref 9.4–12.3)
Monocytes Absolute Automated: 1.22 10*3/uL — ABNORMAL HIGH (ref 0.00–1.20)
Monocytes: 14.1 %
Neutrophils Absolute: 4.51 10*3/uL (ref 1.80–8.10)
Neutrophils: 51.9 %
Nucleated RBC: 0 /100 WBC (ref 0.0–1.0)
Platelets: 456 10*3/uL — ABNORMAL HIGH (ref 140–400)
RBC: 4.69 10*6/uL (ref 4.20–5.40)
RDW: 13 % (ref 12–15)
WBC: 8.68 10*3/uL (ref 3.50–10.80)

## 2018-01-15 LAB — HCG QUANTITATIVE: hCG, Quant.: <1.2

## 2018-01-15 LAB — ETHANOL: Alcohol: NOT DETECTED mg/dL

## 2018-01-15 LAB — GFR: EGFR: >60

## 2018-01-15 LAB — HEMOLYSIS INDEX: Hemolysis Index: 93 — ABNORMAL HIGH (ref 0–18)

## 2018-01-15 MED ORDER — CHLORDIAZEPOXIDE HCL 5 MG PO CAPS
25.0000 mg | ORAL_CAPSULE | Freq: Three times a day (TID) | ORAL | 0 refills | Status: DC | PRN
Start: 2018-01-15 — End: 2019-01-12

## 2018-01-15 MED ORDER — SODIUM CHLORIDE 0.9 % IV BOLUS
1000.00 mL | Freq: Once | INTRAVENOUS | Status: AC
Start: 2018-01-15 — End: 2018-01-15
  Administered 2018-01-15: 1000 mL via INTRAVENOUS

## 2018-01-15 MED ORDER — CHLORDIAZEPOXIDE HCL 25 MG PO CAPS
25.00 mg | ORAL_CAPSULE | Freq: Once | ORAL | Status: AC
Start: 2018-01-15 — End: 2018-01-15
  Administered 2018-01-15: 25 mg via ORAL
  Filled 2018-01-15: qty 1

## 2018-01-15 MED ORDER — PROMETHAZINE HCL 25 MG/ML IJ SOLN
12.50 mg | Freq: Once | INTRAMUSCULAR | Status: AC
Start: 2018-01-15 — End: 2018-01-15
  Administered 2018-01-15: 12.5 mg via INTRAVENOUS
  Filled 2018-01-15: qty 1

## 2018-01-15 MED ORDER — LORAZEPAM 1 MG PO TABS
1.00 mg | ORAL_TABLET | Freq: Once | ORAL | Status: AC
Start: 2018-01-15 — End: 2018-01-15
  Administered 2018-01-15: 1 mg via ORAL
  Filled 2018-01-15: qty 1

## 2018-01-15 MED ORDER — PANTOPRAZOLE SODIUM 40 MG IV SOLR
40.00 mg | Freq: Once | INTRAVENOUS | Status: AC
Start: 2018-01-15 — End: 2018-01-15
  Administered 2018-01-15: 40 mg via INTRAVENOUS
  Filled 2018-01-15: qty 40

## 2018-01-15 NOTE — EDIE (Signed)
COLLECTIVE?NOTIFICATION?01/15/2018 08:04?[name], [name] M?MRN: [id number]    This patient has registered at the [facility] Emergency Department   For more information visit: [email or web address]   Criteria Met      5 ED Visits in 12 Months    3 Different Facilities in 90 Days    Security Events  No recent Security Events currently on file    ED Care Guidelines  There are currently no ED Care Guidelines for this patient. Please check your facility's medical records system.    Care Providers  Provider [facility] Type Phone Fax Service Dates   Elenore Paddy [facility] Primary Care   Current      E.D. Visit Count (12 mo.)  Facility Visits   [facility] 2   [facility] 1   [facility] 1   [facility] 4   [facility] 2   [facility] 2   [facility] 2   Total 14   Note: Visits indicate total known visits.      Recent Emergency Department Visit Summary  Date Facility [facility] Type Major Type Diagnoses or Chief Complaint   Jan 15, 2018  - [place] [facility] [place] [facility]  Emergency      chest pain      Jan 04, 2018 [facility] [place] [facility]  Emergency     Jan 04, 2018 [facility] [place] [facility] [place] [facility]  Emergency      abdominal pain fever      Abdominal Pain      Fever      Generalized abdominal pain      Fever, unspecified      Crohn's disease of both small and large intestine with unspecified complications      Dec 30, 2017 [facility] [place] [facility] [place]  Emergency  Emergency     Dec 29, 2017 [facility] [place] [facility] [place] [facility]  Emergency      FACIAL INJURY      Contusion of other part of head, initial encounter      Unspecified injury of head, initial encounter      Nov 15, 2017 [facility] [place] [facility]  Emergency       Disorder of thyroid, unspecified      Crohn's disease, unspecified, without complications      Urinary tract infection, site not specified      Nov 15, 2017 [facility] [place] [facility]  Emergency  Chief Complaint: POSS ANXIETY ATTACK; CP; SOB    Oct 26, 2017 [facility] [place]  [facility]gency  Emergency      Right upper quadrant pain      Hypothyroidism, unspecified          Recent Inpatient Visit Summary  No recorded inpatient visits.       Prescription Monitoring Program  280??- Narcotic Use Score  280??- Sedative Use Score  000??- Stimulant Use Score  - All Scores range from 000-999 with 75% of the population scoring < 200 and on 1% scoring above 650  - The last digit of the narcotic, sedative, and stimulant score indicates the number of active prescriptions of that type  - Higher Use scores correlate with increased prescribers, pharmacies, mg equiv, and overlapping prescriptions   Concerning or unexpectedly  high scores should prompt a review of the PMP record; this does not constitute checking PMP for prescribing purposes.    The above information is provided for the sole purpose of patient treatment. Use of this information beyond the terms of Data Sharing Memorandum of Understanding and License Agreement is prohibited. In certain cases not all visits may be represented. Consult the aforementioned facilities for additional information.   ? 2019 [employer] - [place], [place] - [email or web address]

## 2018-01-15 NOTE — ED Provider Notes (Signed)
EMERGENCY DEPARTMENT HISTORY AND PHYSICAL EXAM     Physician/Midlevel provider first contact with patient: 01/15/18 0809         Date: 01/15/2018  Patient Name: [name]    History of Presenting Illness     Chief Complaint   Patient presents with   . Epigastric pain   . Alcohol Problem       History Provided By: Patient    Chief Complaint: Alcohol Withdrawal   Duration: 16 hrs ago   Timing:  Constant  Location: Diffuse  Quality: tremulous  Severity: 7 out of 10  Exacerbating factors: None.   Alleviating factors: None.   Associated Symptoms: chest pain, abdominal pain, tremors, vomiting   Pertinent Negatives: seizure, hematemesis     Additional History: [name] is a 32 y.o. female h/o Crohn's disease presenting to the ED with alcohol withdrawal. Her last drink was 16 hours ago. The pt says she has been having chest pain, epigastric abdominal pain, vomiting and tremors since that time. The pt admits to drinking heavily and daily since October and wants to stop and get help. She states that she has never gone through withdrawal in the past. She denies withdrawal seizures and hematemesis.      PCP: Vivia Ewing, NP  SPECIALISTS:    No current facility-administered medications for this encounter.      Current Outpatient Prescriptions   Medication Sig Dispense Refill   . chlordiazePOXIDE (LIBRIUM) 5 MG capsule Take 5 capsules (25 mg total) by mouth 3 (three) times daily as needed (withdrawal). 22 capsule 0   . FLUoxetine (PROZAC) 40 MG capsule Take 40 mg by mouth daily.     Marland Kitchen levothyroxine (SYNTHROID, LEVOTHROID) 88 MCG tablet Take 88 mcg by mouth daily.         Past History     Past Medical History:  Past Medical History:   Diagnosis Date   . Abortion    . Alcohol abuse    . Anxiety    . Crohn's disease    . Depression    . H/O Hashimoto thyroiditis     hypothyroidism   . PCOS (polycystic ovarian syndrome)    . Tachycardia        Past Surgical History:  Past Surgical History:   Procedure  Laterality Date   . BREAST IMPLANT     . COLONOSCOPY     . D & C, SUCTION N/A 08/24/2014    Procedure: D & C, SUCTION;  Surgeon: Maryruth Hancock, MD;  [facility];  Service: Obstetrics;  Laterality: N/A;   . INDUCED ABORTION         Family History:  History reviewed. No pertinent family history.    Social History:  Social History   Substance Use Topics   . Smoking status: Former Smoker     Quit date: 12/30/2007   . Smokeless tobacco: Never Used   . Alcohol use Yes       Allergies:  Allergies   Allergen Reactions   . Bentyl [Dicyclomine] Hives   . Bactrim [Sulfamethoxazole W/Trimethoprim (Co-Trimoxazole)] Rash   . Sulfa Antibiotics Rash       Review of Systems     Review of Systems   Constitutional: Negative for chills and fever.   Respiratory: Negative for shortness of breath.    Cardiovascular: Positive for chest pain.   Gastrointestinal: Positive for abdominal pain and vomiting. Negative for diarrhea and nausea.   Neurological: Positive  for tremors.   All other systems reviewed and are negative.      Physical Exam   BP 131/77   Pulse 86   Temp 98.5 F (36.9 C) (Oral)   Resp 18   Ht 5\' 1"  (1.549 m)   Wt 52.2 kg   LMP 12/29/2017   SpO2 100%   BMI 21.73 kg/m     Physical Exam   Constitutional: She is oriented to person, place, and time. She appears well-developed and well-nourished. No distress.   HENT:   Head: Normocephalic and atraumatic.   Mouth/Throat: Oropharynx is clear and moist.   Eyes: Conjunctivae and EOM are normal.   Neck: Normal range of motion. Neck supple.   Cardiovascular: Normal rate, regular rhythm and normal heart sounds.  Exam reveals no gallop and no friction rub.    No murmur heard.  Pulmonary/Chest: Effort normal and breath sounds normal. No respiratory distress. She has no wheezes. She has no rales.   Abdominal: Soft. Bowel sounds are normal. She exhibits no distension. There is tenderness in the epigastric area. There is no rebound and no guarding.   Musculoskeletal:  Normal range of motion. She exhibits no edema.   Neurological: She is alert and oriented to person, place, and time. She displays tremor.   Skin: Skin is warm and dry.   Psychiatric: She has a normal mood and affect. Her behavior is normal.   Nursing note and vitals reviewed.      Diagnostic Study Results     Labs -     Results     Procedure Component Value Units Date/Time    Beta HCG Quant Serum [[id number]] Collected:  01/15/18 0830     Updated:  01/15/18 0927     hCG, Quant. <1.2    Comprehensive metabolic panel [[id number]]  (Abnormal) Collected:  01/15/18 0830    Specimen:  Blood Updated:  01/15/18 0900     Glucose 102 (H) mg/dL      BUN 7.0 mg/dL      Creatinine 0.8 mg/dL      Sodium 308 mEq/L      Potassium 4.4 mEq/L      Chloride 100 mEq/L      CO2 24 mEq/L      Calcium 9.5 mg/dL      Protein, Total 9.0 (H) g/dL      Albumin 4.1 g/dL      AST (SGOT) 42 (H) U/L      ALT 20 U/L      Alkaline Phosphatase 110 (H) U/L      Bilirubin, Total 0.2 mg/dL      Globulin 4.9 (H) g/dL      Albumin/Globulin Ratio 0.8 (L)     Anion Gap 12.0    Ethanol (Alcohol) Level [[id number]] Collected:  01/15/18 0830    Specimen:  Blood Updated:  01/15/18 0900     Alcohol None Detected mg/dL     Hemolysis index [[id number]]  (Abnormal) Collected:  01/15/18 0830     Updated:  01/15/18 0900     Hemolysis Index 93 (H)    GFR [[id number]] Collected:  01/15/18 0830     Updated:  01/15/18 0900     EGFR >60.0    CBC with differential [[id number]]  (Abnormal) Collected:  01/15/18 0830    Specimen:  Blood from Blood Updated:  01/15/18 0838     WBC 8.68 x10 3/uL      Hgb 13.9 g/dL      Hematocrit 25.9 %  Platelets 456 (H) x10 3/uL      RBC 4.69 x10 6/uL      MCV 91.3 fL      MCH 29.6 pg      MCHC 32.5 g/dL      RDW 13 %      MPV 9.2 (L) fL      Neutrophils 51.9 %      Lymphocytes Automated 33.2 %      Monocytes 14.1 %      Eosinophils Automated 0.1 %      Basophils Automated 0.5 %      Immature Granulocyte 0.2 %      Nucleated RBC 0.0 /100 WBC       Neutrophils Absolute 4.51 x10 3/uL      Abs Lymph Automated 2.88 x10 3/uL      Abs Mono Automated 1.22 (H) x10 3/uL      Abs Eos Automated 0.01 x10 3/uL      Absolute Baso Automated 0.04 x10 3/uL      Absolute Immature Granulocyte 0.02 x10 3/uL      Absolute NRBC 0.00 x10 3/uL           Radiologic Studies -   Radiology Results (24 Hour)     ** No results found for the last 24 hours. **      .    Medical Decision Making   I am the first provider for this patient.    I reviewed the vital signs, available nursing notes, past medical history, past surgical history, family history and social history.    Vital Signs-Reviewed the patient's vital signs.     Patient Vitals for the past 12 hrs:   BP Temp Pulse Resp   01/15/18 1100 131/77 98.5 F (36.9 C) 86 18   01/15/18 1054 135/80 - 92 18   01/15/18 1000 137/81 98.3 F (36.8 C) (!) 102 18   01/15/18 0818 144/89 99 F (37.2 C) (!) 107 20       Pulse Oximetry Analysis - Normal    EKG:  Interpreted by the EP.   Time Interpreted: 8:04 AM   Rate: 107 bpm   Rhythm: Sinus Tachycardia    Interpretation: Short PR. Nonspecific T wave abnormality. Normal axis. No STEMI.    Old Medical Records: Nursing notes.     ED Course:   8:13 AM -  Discussed ED plan with the pt, who agrees with the plan.      10:30 AM - Pt was given oupt resources and the pt is looking at resources on her own as well.     10:56 AM - Pt is feeling better, but is still very anxious and agrees with plan for outpt treatment and f/u.    12:03 PM - Discussed results, discharge instructions, follow up instructions, and return precautions with the patient.  Solicited and answered all questions.  The patient agrees with the care plan and is amenable to discharge.    Provider Notes: Pt with alcohol withdrawal. Symptoms improved after Librium. She was evaluated by psych, who recommended outpt services. Pt will be [facility]'d with Librium and outpt services.       Diagnosis     Clinical Impression:   1. Alcohol withdrawal  syndrome without complication        Treatment Plan:   ED Disposition     ED Disposition Condition Date/Time Comment    Discharge  Fri Jan 15, 2018 11:59 AM [facility] Friday discharge to  home/self care.    Condition at disposition: Stable            _______________________________      Attestations: This note is prepared by Daryll Drown, acting as scribe for Mariane Duval, MD. The scribe's documentation has been prepared under my direction and personally reviewed by me in its entirety.  I confirm that the note above accurately reflects all work, treatment, procedures, and medical decision making performed by me.    _______________________________     Verlee Rossetti, MD  01/15/18 [phone]

## 2018-01-15 NOTE — ED Triage Notes (Signed)
Pt said she is here for epigastric pain that started an hour ago. Pt said she drinks alcohol every day since October 2018 but been sober for 16 hours. Pt said she is vomiting at home. Pt denies having alcohol seizure withdrawal. Pt said she is not vomiting blood.

## 2018-01-15 NOTE — Discharge Instructions (Signed)
Day 1: Librium 25-50 every 4 hours prn  Day 2: Librium 25-50 every 6 hours prn  Day 3: Librium 25-50 every 6 hours prn  Day 4: Librium 25-50 every 12 hours prn  Day 5: Librium 25-50 every 12 hours prn    ETOH Withdrawal    You have been diagnosed with alcohol withdrawal.    Alcohol abuse (alcoholism) is common. More than 100 million people drink alcohol. 10-20% (1 or 2 out of 10) are considered alcoholics. When you drink too much and too often, your body gets addicted to alcohol. When you stop drinking, you can have withdrawal symptoms. This is a serious problem that can be life-threatening. Alcohol abuse can ruin your life and the lives of those who care about you.    Some alcohol withdrawal symptoms are:   Insomnia (trouble sleeping), tremors (shaking), irritability and depression. You may feel fatigue (tired) and/or anxiety.   Vomiting (throwing up) and diarrhea (loose stool). You may have high blood pressure or palpitations (abnormal heart beats).   Some SERIOUS SYMPTOMS are hallucinations (seeing/hearing things that others do not), seizures, bad tremors and confusion.    It is important to see a counselor and a family doctor regularly. A counselor can help you with your problem and follow your progress.    Alcohol abuse (alcoholism) is a serious health problem that may shorten your life. It may have serious effects on family, friends and work relationships. Treatment is often recommended. The staff should be able to give you a list of local numbers for alcohol treatment like chemical dependency units and Alcoholics Anonymous (AA).    YOU SHOULD SEEK MEDICAL ATTENTION IMMEDIATELY, EITHER HERE OR AT THE NEAREST EMERGENCY DEPARTMENT, IF ANY OF THE FOLLOWING OCCURS:   Uncontrollable shaking develops or you see/hear things that others do not see/hear (hallucinations).   Thoughts of harming yourself (suicidal thoughts).   Constant vomiting (3 or more episodes), blood in your vomit or stool. Blood in stool  can be bright or dark red. It may be black and tarry if digested.   You do not feel safe in your home environment. This means if you feel you are in danger of bodily harm or that you will start drinking again.

## 2018-01-15 NOTE — Progress Notes (Signed)
Psychiatric Evaluation Part I    [name] is a 32 y.o. female admitted to the [facility] Emergency Department who was seen via Telepsych on 01/15/2018 by Larinda Buttery.    Call Details  Patient Location: [facility] ED  Patient Room Number: GR8  Time contacted by ED Physician: [phone]  Time consult began: 1013  Time (in minutes) from Call to Consult: 36  Time consult concluded: 1110  Referring ED Department  Emergency Department: [facility]        Discharge Planning  Living Arrangements: Alone  Support Systems: Family members, Friends/neighbors  Type of Residence: Private residence  Patient expects to be discharged to:: home    Presenting Mental Status  Orientation Level: Oriented X4, Oriented to place, Oriented to time, Oriented to situation, Oriented to person  Memory: No Impairment  Thought Content: normal  Thought Process: normal  Behavior: normal  Consciousness: Alert  Impulse Control: impaired  Perception: normal  Eye Contact: normal  Attitude: cooperative  Mood: anxious  Hopelessness Affects Goals: No  Hopelessness About Future: No  Affect: normal  Speech: normal  Concentration: normal  Insight: fair  Judgment: fair  Appearance: normal  Appetite: normal  Weight change?: normal  Energy: decreased  Sleep: difficulty falling asleep (Difficulty staying asleep)  Reliability of Reporter/Patient: fair    Tool for Assessment of Suicide Risk  Individual Risk Profile: age 32-30  Symptom Risk Profile: impulsivity, anxiety  Interview Risk Profile: recent substance abuse  Protective Factors: employed  Level of Suicide Risk: Low    Within the Last 6 Months:: no history of violence toward self  Greater than 6 Months Ago:: no history of violence toward self              Substance Abuse History  Previous Substance Abuse Treatment?: No  Self-Help Group Involvement  Current Involvement: None  Past Involvement: None  Sponsor: None  Substance Recovery Support  Have you been prescribed medications to support recovery?:  None  Recovery Resources/Support: NA  Transportation Available: Yes  Past Withdrawal Symptoms  Past Withdrawal Symptoms: Anxiety, Tremors, Nausea/vomiting, Diarrhea, Joint/Bodyaches  History of Blackouts?: No  History of Withdrawal Seizures?: No    Preliminary Diagnosis #1: F10.20 Alcohol Use Disorder  Preliminary Diagnosis #2: None  Preliminary Diagnosis #3: None  Preliminary Diagnosis #4: None  Violence Toward Others  Within the Last 6 Months:: no history of violence toward others  Greater than 6 Months Ago:: no history of violence toward others  Preliminary Diagnosis #5: None  Preliminary Diagnosis (DSM IV)  Axis I: F10.20 Alcohol Use Disorder  Axis II: deferred  Axis III: see medical chart  Axis IV: Other (comment) (None apply)  Axis V on Admission: 65  Axis V - Highest in Past Year: Unk      Summary: Pt is a 32 yo single Caucasian female recommended for outpatient treatment after coming to the ED for alcohol withdrawals.  Pt denies suicidal ideation, homicidal ideation, self-injurious behavior and hallucinations. Pt denies any previous suicide attempts.  Pt reports that she has difficulty sleeping which is why she has been drinking. Pt reports consuming a handle of vodka every 2-3 days.  Pt reports that this pattern of drinking has been going on for the past 2 months. Pt reports that prior to this she only drank socially.    Substance Use: Pt reports consuming a handle of vodka every 2-3 days.  Pt reports that her first drink was at age 73.  Pt reports current  withdrawal symptoms include nausea, vomiting, diarrhea, body aches and anxiety. Pt has no history of seizures or blackouts.  No current or past involvement with A.A.   Supports: Family and friends.  Stressors: None  Current Mental Health Treatment: None  Past Mental Health Treatment: Pt reports seeing a therapist for anxiety many years ago. Pt has never seen a psychiatrist or been on medications. No history of inpatient treatment.  Employment/School  Status: Employed as an Building control surveyor: Resides alone   Legal: None  Trauma: None  Vitals: 137/81, 102, 18, 98.3    Disposition: Pt does not want inpatient detox, day treatment or IOP all of which were offered to her.  Pt reports she plans to follow up with A.A and that she has already researched meetings in her area.  Pt reports that she is also going to look for a therapist through her insurance company. Pt medically cleared by Dr. Katrinka Blazing who is in agreement with the plan.     Justification for disposition: Pt denies SI.HI/SIB/AH.  No safety concerns.  Pt will follow up on her own    If patient is voluntarily admitted to an [facility] inpatient psychiatric unit and decides to leave AMA within the first 8 hours on the unit, is there an identified petitioner?No    Name of Petitioner:NA  Contact Information:NA    Insurance Pre-authorization information:D/C none needed    Was consent for voluntary admission obtain and scanned into EPIC? NA     By whom? NA      Larinda Buttery    [facility]  [address]  [place], [place] [address]  [phone]

## 2018-01-15 NOTE — ED Notes (Signed)
Resources    Expert addiction treatment services for adults  [facility] ([facility]) is a regional leader in  providing the highest quality addiction treatment services for adults. We offer a series of structured  programs that provides effective, compassionate treatment for individuals dealing with all forms of  chemical dependency. These include addiction to alcohol, prescription drugs, heroin, cocaine and  other drugs.  Our professional addiction treatment team includes physicians, psychiatrists, nurses, licensed clinical  therapists and certified addiction counselors. Upon performing an in depth assessment, an  individualized treatment plan will be created. Our [place] [facility] program offers a continuum of care,  providing inpatient medical detoxification, partial hospitalization, intensive outpatient programs  (day and evening times), dual diagnosis intensive outpatient programs, sober living and relapse  prevention groups, and medication assisted therapies such as buprenorphine (brand name  Suboxone) and long-acting naltrexone (brand name Vivitrol). We also collaborate with a large  network of residential facilities throughout the country and can refer to an appropriate program if  needed.  Contact us  You can contact the [facility] at [phone], option 3 for CATS  counselors 24 hours a day or email [email or web address] (non-urgent emails only).  If you would like to participate in any of the CATS programs, we will complete a full assessment to  determine the appropriate services and level of care.  A potential client may first call our intake line at [phone] to perform a phone screening and  to answer any questions or get more information about our programs. At the time of the phone  screening, an intake assessment is scheduled.  Potential clients may also be admitted:  Through our emergency rooms (ER)  By transfer from other facilities  After  being evaluated through [facility]) walk-in clinic  located at [address], [address], [place], [place] [address]  After evaluation at our [facility] inpatient program at [facility] ([address]  [place], [place] [address])  If an individual is intoxicated it is recommended that they go to their nearest Emergency Room.Expert addiction treatment services for adults  [facility] ([facility]) is a regional leader in  providing the highest quality addiction treatment services for adults. We offer a series of structured  programs that provides effective, compassionate treatment for individuals dealing with all forms of  chemical dependency. These include addiction to alcohol, prescription drugs, heroin, cocaine and  other drugs.  Our professional addiction treatment team includes physicians, psychiatrists, nurses, licensed clinical  therapists and certified addiction counselors. Upon performing an in depth assessment, an  individualized treatment plan will be created. Our [place] [facility] program offers a continuum of care,  providing inpatient medical detoxification, partial hospitalization, intensive outpatient programs  (day and evening times), dual diagnosis intensive outpatient programs, sober living and relapse  prevention groups, and medication assisted therapies such as buprenorphine (brand name  Suboxone) and long-acting naltrexone (brand name Vivitrol). We also collaborate with a large  network of residential facilities throughout the country and can refer to an appropriate program if  needed.  Contact us  You can contact the [facility] at [phone], option 3 for CATS  counselors 24 hours a day or email [email or web address] (non-urgent emails only).  If you would like to participate in any of the CATS programs, we will complete a full assessment to  determine the appropriate services and level of  care.  A potential  client may first call our intake line at [phone] to perform a phone screening and  to answer any questions or get more information about our programs. At the time of the phone  screening, an intake assessment is scheduled.  Potential clients may also be admitted:  Through our emergency rooms (ER)  By transfer from other facilities  After being evaluated through [facility]) walk-in clinic  located at [address], [address], [place], [place] [address]  After evaluation at our CATS inpatient program at [facility] ([address]  [place], [place] [address])  If an individual is intoxicated it is recommended that they go to their nearest Emergency Room.          CATS Day Treatment   [phone]    Providing a structured supportive environment for people in recovery from chemical and process addictions    . Partial hospitalization program offered at [facility], first floor  . Program operates from 8:30 AM to 4:00 PM seven days per week, Monday to Sunday, even on public holidays  . A typical length of stay is five to seven days  . This is an abstinence-based program.    . Patients will participate in approximately 4-5 group counseling sessions    . Patients will see a psychiatrist and nurse on a daily basis for medication management and medical monitoring  . Patients are encouraged to attend sober support group meetings (AA, NA, SMART Recovery, etc.) as part of the day treatment curriculum  . Family involvement is encouraged to ensure continuation of care.      Intensive Outpatient Program  [phone]    The Intensive Outpatient Program offers integrated and individualized care for individuals to provide continuity following completion of inpatient care. Individuals attend three hours a day up to five days a week.   The program may also be appropriate for those whose treatment needs do not require the structure of a  residential program.  Patients enrolled in IOP have access to all of our therapy and support services.

## 2018-01-16 LAB — ECG 12-LEAD
Atrial Rate: 107 {beats}/min
P Axis: 57 degrees
P-R Interval: 94 ms
Q-T Interval: 324 ms
QRS Duration: 64 ms
QTC Calculation (Bezet): 432 ms
R Axis: 67 degrees
T Axis: 27 degrees
Ventricular Rate: 107 {beats}/min

## 2018-02-18 DIAGNOSIS — F32A Depression, unspecified: Secondary | ICD-10-CM | POA: Insufficient documentation

## 2018-02-18 DIAGNOSIS — E038 Other specified hypothyroidism: Secondary | ICD-10-CM | POA: Insufficient documentation

## 2018-04-25 ENCOUNTER — Emergency Department
Admission: EM | Admit: 2018-04-25 | Discharge: 2018-04-25 | Disposition: A | Discharge status: Home / Self Care | Payer: No Typology Code available for payment source | Attending: Emergency Medicine | Admitting: Emergency Medicine

## 2018-04-25 ENCOUNTER — Emergency Department: Payer: No Typology Code available for payment source

## 2018-04-25 DIAGNOSIS — N390 Urinary tract infection, site not specified: Secondary | ICD-10-CM | POA: Insufficient documentation

## 2018-04-25 DIAGNOSIS — Z882 Allergy status to sulfonamides status: Secondary | ICD-10-CM | POA: Insufficient documentation

## 2018-04-25 DIAGNOSIS — Z79899 Other long term (current) drug therapy: Secondary | ICD-10-CM | POA: Insufficient documentation

## 2018-04-25 DIAGNOSIS — Z881 Allergy status to other antibiotic agents status: Secondary | ICD-10-CM | POA: Insufficient documentation

## 2018-04-25 DIAGNOSIS — E063 Autoimmune thyroiditis: Secondary | ICD-10-CM | POA: Insufficient documentation

## 2018-04-25 DIAGNOSIS — Z87891 Personal history of nicotine dependence: Secondary | ICD-10-CM | POA: Insufficient documentation

## 2018-04-25 LAB — COMPREHENSIVE METABOLIC PANEL
ALT: 19 U/L (ref 0–55)
AST (SGOT): 34 U/L (ref 5–34)
Albumin/Globulin Ratio: 1.1 (ref 0.9–2.2)
Albumin: 4 g/dL (ref 3.5–5.0)
Alkaline Phosphatase: 77 U/L (ref 37–106)
Anion Gap: 14 (ref 5.0–15.0)
BUN: 8 mg/dL (ref 7.0–19.0)
Bilirubin, Total: 0.8 mg/dL (ref 0.2–1.2)
CO2: 19 mEq/L — ABNORMAL LOW (ref 22–29)
Calcium: 9.3 mg/dL (ref 8.5–10.5)
Chloride: 102 mEq/L (ref 100–111)
Creatinine: 0.8 mg/dL (ref 0.6–1.0)
Globulin: 3.5 g/dL (ref 2.0–3.6)
Glucose: 95 mg/dL (ref 70–100)
Potassium: 3.7 mEq/L (ref 3.5–5.1)
Protein, Total: 7.5 g/dL (ref 6.0–8.3)
Sodium: 135 mEq/L — ABNORMAL LOW (ref 136–145)

## 2018-04-25 LAB — CBC AND DIFFERENTIAL
Absolute NRBC: 0 10*3/uL (ref 0.00–0.00)
Basophils Absolute Automated: 0.05 10*3/uL (ref 0.00–0.08)
Basophils Automated: 0.3 %
Eosinophils Absolute Automated: 0.01 10*3/uL (ref 0.00–0.44)
Eosinophils Automated: 0.1 %
Hematocrit: 37 % (ref 34.7–43.7)
Hgb: 12.1 g/dL (ref 11.4–14.8)
Immature Granulocytes Absolute: 0.07 10*3/uL (ref 0.00–0.07)
Immature Granulocytes: 0.5 %
Lymphocytes Absolute Automated: 1.89 10*3/uL (ref 0.42–3.22)
Lymphocytes Automated: 12.6 %
MCH: 29.8 pg (ref 25.1–33.5)
MCHC: 32.7 g/dL (ref 31.5–35.8)
MCV: 91.1 fL (ref 78.0–96.0)
MPV: 9.4 fL (ref 8.9–12.5)
Monocytes Absolute Automated: 1.18 10*3/uL — ABNORMAL HIGH (ref 0.21–0.85)
Monocytes: 7.8 %
Neutrophils Absolute: 11.85 10*3/uL — ABNORMAL HIGH (ref 1.10–6.33)
Neutrophils: 78.7 %
Nucleated RBC: 0 /100 WBC (ref 0.0–0.0)
Platelets: 429 10*3/uL — ABNORMAL HIGH (ref 142–346)
RBC: 4.06 10*6/uL (ref 3.90–5.10)
RDW: 13 % (ref 11–15)
WBC: 15.05 10*3/uL — ABNORMAL HIGH (ref 3.10–9.50)

## 2018-04-25 LAB — POCT RAPID STREP A: Rapid Strep A Screen POCT: NEGATIVE

## 2018-04-25 LAB — URINALYSIS, REFLEX TO MICROSCOPIC EXAM IF INDICATED
Bilirubin, UA: NEGATIVE
Glucose, UA: NEGATIVE
Ketones UA: NEGATIVE
Nitrite, UA: NEGATIVE
Protein, UR: 100 — AB
Specific Gravity UA: 1.013 (ref 1.001–1.035)
Urine pH: 8 (ref 5.0–8.0)
Urobilinogen, UA: 2 mg/dL

## 2018-04-25 LAB — HCG QUANTITATIVE: hCG, Quant.: <1.2

## 2018-04-25 LAB — GFR: EGFR: >60

## 2018-04-25 LAB — HEMOLYSIS INDEX: Hemolysis Index: 10 (ref 0–18)

## 2018-04-25 MED ORDER — SODIUM CHLORIDE 0.9 % IV BOLUS
1000.00 mL | Freq: Once | INTRAVENOUS | Status: AC
Start: 2018-04-25 — End: 2018-04-25
  Administered 2018-04-25: 1000 mL via INTRAVENOUS

## 2018-04-25 MED ORDER — CEFUROXIME AXETIL 250 MG PO TABS
250.00 mg | ORAL_TABLET | Freq: Two times a day (BID) | ORAL | 0 refills | Status: AC
Start: 2018-04-25 — End: 2018-05-02

## 2018-04-25 MED ORDER — CEPHALEXIN 500 MG PO CAPS
500.00 mg | ORAL_CAPSULE | Freq: Once | ORAL | Status: AC
Start: 2018-04-25 — End: 2018-04-25
  Administered 2018-04-25: 500 mg via ORAL
  Filled 2018-04-25: qty 1

## 2018-04-25 MED ORDER — MORPHINE SULFATE 10 MG/ML IJ/IV SOLN (WRAP)
6.0000 mg | Freq: Once | Status: AC
Start: 2018-04-25 — End: 2018-04-25
  Administered 2018-04-25: 6 mg via INTRAMUSCULAR
  Filled 2018-04-25: qty 1

## 2018-04-25 MED ORDER — OXYCODONE HCL 5 MG PO TABS
5.00 mg | ORAL_TABLET | Freq: Once | ORAL | Status: AC
Start: 2018-04-25 — End: 2018-04-25
  Administered 2018-04-25: 5 mg via ORAL
  Filled 2018-04-25: qty 1

## 2018-04-25 MED ORDER — ONDANSETRON HCL 4 MG/2ML IJ SOLN
4.00 mg | Freq: Once | INTRAMUSCULAR | Status: DC
Start: 2018-04-25 — End: 2018-04-25

## 2018-04-25 MED ORDER — ONDANSETRON 4 MG PO TBDP
4.00 mg | ORAL_TABLET | Freq: Four times a day (QID) | ORAL | 0 refills | Status: DC | PRN
Start: 2018-04-25 — End: 2019-01-12

## 2018-04-25 MED ORDER — FAMOTIDINE 10 MG/ML IV SOLN (WRAP)
20.00 mg | Freq: Once | INTRAVENOUS | Status: DC
Start: 2018-04-25 — End: 2018-04-25

## 2018-04-25 MED ORDER — ONDANSETRON HCL 4 MG/2ML IJ SOLN
4.00 mg | Freq: Once | INTRAMUSCULAR | Status: AC
Start: 2018-04-25 — End: 2018-04-25
  Administered 2018-04-25: 4 mg via INTRAVENOUS
  Filled 2018-04-25: qty 2

## 2018-04-25 MED ORDER — KETOROLAC TROMETHAMINE 15 MG/ML IJ SOLN
15.00 mg | Freq: Once | INTRAMUSCULAR | Status: AC
Start: 2018-04-25 — End: 2018-04-25
  Administered 2018-04-25: 15 mg via INTRAVENOUS
  Filled 2018-04-25: qty 1

## 2018-04-25 NOTE — Discharge Instructions (Signed)
We are worried about pyelonephritis. But you also have throat issues and upper abdominal pain - so if anything worsens or you have a fever please do return for reassessment (despite the negative swabs and ultrasound - things can change - so do come back).    Pyelonephritis    You have been diagnosed with pyelonephritis. This is also known as a kidney infection.    Pyelonephritis is a bacterial infection in the kidneys. Some symptoms are: Fever (temperature higher than 100.61F / 38C), chills, nausea (sick to the stomach) and vomiting (throwing up). Back pain on one or both sides is another symptom. There is sometimes trouble urinating (peeing). You may also feel very weak. The diagnosis is made based on your symptoms and a test of your urine.    Pyelonephritis most happens most often when a lower urinary tract infection (bladder infection) climbs up the urinary tract and gets into the kidney. It is important to treat a bladder infection early. This is to prevent pyelonephritis and possible kidney damage.    Pyelonephritis is treated with antibiotics, pain and fever medicines and fluids. Some patients need an "IV" if they have nausea (sick to the stomach) or vomiting and cannot keep down the antibiotics, or if they aren't getting better after 2-3 days of oral (by mouth) medications. Most pyelonephritis patients do not need to check into the hospital. A few patients who don't do well with the oral (by mouth) antibiotics or get sicker despite treatment may need to get admitted to the hospital.    YOU SHOULD SEEK MEDICAL ATTENTION IMMEDIATELY, EITHER HERE OR AT THE NEAREST EMERGENCY DEPARTMENT, IF ANY OF THE FOLLOWING OCCURS:   Failure to improve after 2-3 days of antibiotics.   Nausea or vomiting develops and you cannot to keep down medicines or fluids.   Increased weakness or lightheadedness.   Any progressive or worsening symptoms or any other concerns develop.

## 2018-04-25 NOTE — ED Triage Notes (Signed)
Pt c/o LLQ pain and bilateral flank pain that started 3 hours ago.  C/o frequent urination.
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
## 2018-04-25 NOTE — ED Provider Notes (Signed)
EMERGENCY DEPARTMENT HISTORY AND PHYSICAL EXAM    Date: 04/25/2018  Patient Name: [name]  Attending Physician:  Kelly Splinter, MD  Diagnosis and Treatment Plan       Clinical Impression:   1. Acute UTI        Treatment Plan:   ED Disposition     ED Disposition Condition Date/Time Comment    Discharge  Sun Apr 25, 2018  9:36 AM [name] discharge to home/self care.    Condition at disposition: Stable          History of Presenting Illness     Chief Complaint   Patient presents with   . Abdominal Pain   . Nausea       History Provided By: Patient   Chief Complaint: Abdominal Pain; Nausea     Additional History: [name] is a 32 y.o. female h/o Crohn's disease (LKMP: 04/19/2018) presenting with 3 days of LLQ abdominal pain and nausea onset 2 AM. She describes her abdominal pain as spasmodic and cramping. The pt says when she urinated in the ED, she had blood in her urine. She notes urinary frequency since last night, as well. She denies diarrhea, vomiting and fever. No past abdominal surgeries.     PCP: Vivia Ewing, NP      Current Facility-Administered Medications:   .  sodium chloride 0.9 % bolus 1,000 mL, 1,000 mL, Intravenous, Once, Darcus Pester, MD, Last Rate: 1,000 mL/hr at 04/25/18 0630, 1,000 mL at 04/25/18 0630    Current Outpatient Prescriptions:   .  chlordiazePOXIDE (LIBRIUM) 5 MG capsule, Take 5 capsules (25 mg total) by mouth 3 (three) times daily as needed (withdrawal)., Disp: 22 capsule, Rfl: 0  .  FLUoxetine (PROZAC) 40 MG capsule, Take 40 mg by mouth daily., Disp: , Rfl:   .  levothyroxine (SYNTHROID, LEVOTHROID) 88 MCG tablet, Take 88 mcg by mouth daily., Disp: , Rfl:     Past Medical History     Past Medical History:   Diagnosis Date   . Abortion    . Alcohol abuse    . Anxiety    . Crohn's disease    . Depression    . H/O Hashimoto thyroiditis     hypothyroidism   . PCOS (polycystic ovarian syndrome)    . Tachycardia      Past Surgical History:    Procedure Laterality Date   . BREAST IMPLANT     . COLONOSCOPY     . D & C, SUCTION N/A 08/24/2014    Procedure: D & C, SUCTION;  Surgeon: Maryruth Hancock, MD;  [facility];  Service: Obstetrics;  Laterality: N/A;   . INDUCED ABORTION         Family History     History reviewed. No pertinent family history.    Social History     Social History     Social History   . Marital status: Single     Spouse name: N/A   . Number of children: N/A   . Years of education: N/A     Social History Main Topics   . Smoking status: Former Smoker     Quit date: 12/30/2007   . Smokeless tobacco: Never Used   . Alcohol use Yes   . Drug use: No   . Sexual activity: Yes     Partners: Male     Birth control/ protection: Condom     Other Topics  Concern   . Not on file     Social History Narrative   . No narrative on file       Allergies     Allergies   Allergen Reactions   . Bentyl [Dicyclomine] Hives   . Bactrim [Sulfamethoxazole W/Trimethoprim (Co-Trimoxazole)] Rash   . Sulfa Antibiotics Rash       Review of Systems     Review of Systems   Constitutional: Negative for fever.   Gastrointestinal: Positive for abdominal pain and nausea. Negative for diarrhea and vomiting.   Genitourinary: Positive for frequency and hematuria.   Allergic/Immunologic: Negative for environmental allergies.   Psychiatric/Behavioral: Negative for suicidal ideas.   Patient asked and all other systems reviewed and are negative for acute complaints/concerns.    Physical Exam     BP 152/87   Pulse (!) 133   Temp 98.4 F (36.9 C) (Oral)   Resp 17   Ht 5\' 1"  (1.549 m)   Wt 47.6 kg   LMP 04/21/2018   SpO2 100%   BMI 19.84 kg/m   Pulse Oximetry Analysis - Normal 100% On RA    Physical Exam   Constitutional: She is oriented to person, place, and time. She appears well-developed and well-nourished.   HENT:   Head: Normocephalic and atraumatic.   Mmm, prominent tonsils   Eyes: Conjunctivae are normal. No scleral icterus.   Neck: Normal range of  motion. Neck supple.   Cardiovascular: Regular rhythm and intact distal pulses.    tachy   Pulmonary/Chest: Effort normal. No respiratory distress. She has no wheezes. She has no rales.   Abdominal: Soft. She exhibits no distension. There is tenderness. There is no rebound and no guarding.   Diffuse upper abd ttp, more left sided   Musculoskeletal: Normal range of motion. She exhibits no edema.   Neurological: She is alert and oriented to person, place, and time. She exhibits normal muscle tone. Coordination normal.   Skin: Skin is warm and dry.   Psychiatric: She has a normal mood and affect. Her behavior is normal. Judgment and thought content normal.   Nursing note and vitals reviewed.  prominent tonsils with crypts  Noted to have an episode of increased pain in her calves    Diagnostic Study Results     Labs -     Results     Procedure Component Value Units Date/Time    CBC with differential [[id number]]  (Abnormal) Collected:  04/25/18 0553    Specimen:  Blood from Blood Updated:  04/25/18 0639     WBC 15.05 (H) x10 3/uL      Hgb 12.1 g/dL      Hematocrit 40.9 %      Platelets 429 (H) x10 3/uL      RBC 4.06 x10 6/uL      MCV 91.1 fL      MCH 29.8 pg      MCHC 32.7 g/dL      RDW 13 %      MPV 9.4 fL      Neutrophils 78.7 %      Lymphocytes Automated 12.6 %      Monocytes 7.8 %      Eosinophils Automated 0.1 %      Basophils Automated 0.3 %      Immature Granulocyte 0.5 %      Nucleated RBC 0.0 /100 WBC      Neutrophils Absolute 11.85 (H) x10 3/uL      Abs Lymph  Automated 1.89 x10 3/uL      Abs Mono Automated 1.18 (H) x10 3/uL      Abs Eos Automated 0.01 x10 3/uL      Absolute Baso Automated 0.05 x10 3/uL      Absolute Immature Granulocyte 0.07 x10 3/uL      Absolute NRBC 0.00 x10 3/uL     Comprehensive metabolic panel [[id number]] Collected:  04/25/18 0553    Specimen:  Blood Updated:  04/25/18 0631    UA, Reflex to Microscopic (pts 3 + yrs) [[id number]] Collected:  04/25/18 0553    Specimen:  Urine Updated:   04/25/18 0631    Beta HCG Quant Serum [[id number]] Collected:  04/25/18 0553     Updated:  04/25/18 0631          Radiologic Studies -   Radiology Results (24 Hour)     ** No results found for the last 24 hours. **      .    Doctor's Notes     Throughout the stay in the Emergency Department, questions and concerns surrounding pain control, care plans, diagnostic studies, effects of medications administered or prescribed, and future prognostic dilemmas were assessed and addressed.    ROS addendum: The patient and/or family was asked if they had any other complaints or concerns that we could address today and nothing of significance was noted.     VITALS:  Patient Vitals for the past 12 hrs:   BP Temp Pulse Resp   04/25/18 0534 152/87 98.4 F (36.9 C) (!) 133 17     IMP & PLAN: plan on ultrasound, labs including urine, trial of sxs treatment. Strep and flu swab, review for dispo    ED COURSE:   6:29 AM -  Discussed ED plan with the pt, who agrees with the plan.      9:03 AM - Updated the pt on the results thus far and the plan going forward. She is agreeable. Pt requests paper Rx.    9:35 AM - Rx use and side effects, results, home self care, discharge instructions, and return precautions discussed extensively with patient. Possibility of evolving illness reviewed. All questions solicited and addressed. Patient is amenable to discharge.     _______________________________  Medical DeMedical Decision Makingcision Making  Attestations:     Physician/Midlevel provider first contact with patient: 04/25/18 0627         This note is prepared by Daryll Drown, acting as Scribe for Kelly Splinter, MD.    Kelly Splinter, MD:  The scribe's documentation has been prepared under my direction and personally reviewed by me in its entirety.  I confirm that the note above accurately reflects all work, treatment, procedures, and medical decision making performed by me.    I am the first provider for this patient.    Kelly Splinter,  MD is the primary emergency doctor of record.    I reviewed the vital signs, available nursing notes, past medical history, past surgical history, family history and social history.    _______________________________           Westley Foots, MD  04/29/18 [phone]

## 2018-04-30 NOTE — Progress Notes (Signed)
On cefuroxime

## 2018-10-14 DIAGNOSIS — Z8719 Personal history of other diseases of the digestive system: Secondary | ICD-10-CM | POA: Insufficient documentation

## 2018-12-12 DIAGNOSIS — E860 Dehydration: Secondary | ICD-10-CM | POA: Insufficient documentation

## 2019-01-12 ENCOUNTER — Emergency Department: Payer: BC Managed Care – PPO

## 2019-01-12 ENCOUNTER — Emergency Department
Admission: EM | Admit: 2019-01-12 | Discharge: 2019-01-13 | Disposition: A | Discharge status: Home / Self Care | Payer: BC Managed Care – PPO | Attending: Emergency Medicine | Admitting: Emergency Medicine

## 2019-01-12 ENCOUNTER — Emergency Department
Admission: EM | Admit: 2019-01-12 | Discharge: 2019-01-12 | Disposition: A | Discharge status: Home / Self Care | Payer: BC Managed Care – PPO | Attending: Emergency Medical Services | Admitting: Emergency Medical Services

## 2019-01-12 DIAGNOSIS — E063 Autoimmune thyroiditis: Secondary | ICD-10-CM | POA: Insufficient documentation

## 2019-01-12 DIAGNOSIS — R1013 Epigastric pain: Secondary | ICD-10-CM | POA: Insufficient documentation

## 2019-01-12 DIAGNOSIS — R1011 Right upper quadrant pain: Secondary | ICD-10-CM | POA: Insufficient documentation

## 2019-01-12 DIAGNOSIS — E039 Hypothyroidism, unspecified: Secondary | ICD-10-CM | POA: Insufficient documentation

## 2019-01-12 LAB — HEPATIC FUNCTION PANEL
ALT: 42 U/L (ref 0–55)
AST (SGOT): 47 U/L — ABNORMAL HIGH (ref 5–34)
Albumin/Globulin Ratio: 0.9 (ref 0.9–2.2)
Albumin: 4.4 g/dL (ref 3.5–5.0)
Alkaline Phosphatase: 182 U/L — ABNORMAL HIGH (ref 37–106)
Bilirubin Direct: 0.3 mg/dL (ref 0.0–0.5)
Bilirubin Indirect: 0.5 mg/dL (ref 0.2–1.0)
Bilirubin, Total: 0.8 mg/dL (ref 0.2–1.2)
Globulin: 5 g/dL — ABNORMAL HIGH (ref 2.0–3.6)
Protein, Total: 9.4 g/dL — ABNORMAL HIGH (ref 6.0–8.3)

## 2019-01-12 LAB — BASIC METABOLIC PANEL
Anion Gap: 12 (ref 5.0–15.0)
BUN: 11 mg/dL (ref 7–19)
CO2: 23 mEq/L (ref 22–29)
Calcium: 10.3 mg/dL (ref 8.5–10.5)
Chloride: 101 mEq/L (ref 100–111)
Creatinine: 0.7 mg/dL (ref 0.6–1.0)
Glucose: 101 mg/dL — ABNORMAL HIGH (ref 70–100)
Potassium: 4.5 mEq/L (ref 3.5–5.1)
Sodium: 136 mEq/L (ref 136–145)

## 2019-01-12 LAB — CBC AND DIFFERENTIAL
Absolute NRBC: 0 10*3/uL (ref 0.00–0.00)
Basophils Absolute Automated: 0.03 10*3/uL (ref 0.00–0.08)
Basophils Automated: 0.4 %
Eosinophils Absolute Automated: 0.04 10*3/uL (ref 0.00–0.44)
Eosinophils Automated: 0.6 %
Hematocrit: 43.1 % (ref 34.7–43.7)
Hgb: 13.5 g/dL (ref 11.4–14.8)
Immature Granulocytes Absolute: 0.02 10*3/uL (ref 0.00–0.07)
Immature Granulocytes: 0.3 %
Lymphocytes Absolute Automated: 1.37 10*3/uL (ref 0.42–3.22)
Lymphocytes Automated: 19.6 %
MCH: 27.8 pg (ref 25.1–33.5)
MCHC: 31.3 g/dL — ABNORMAL LOW (ref 31.5–35.8)
MCV: 88.9 fL (ref 78.0–96.0)
MPV: 9.5 fL (ref 8.9–12.5)
Monocytes Absolute Automated: 0.62 10*3/uL (ref 0.21–0.85)
Monocytes: 8.9 %
Neutrophils Absolute: 4.9 10*3/uL (ref 1.10–6.33)
Neutrophils: 70.2 %
Nucleated RBC: 0 /100 WBC (ref 0.0–0.0)
Platelets: 543 10*3/uL — ABNORMAL HIGH (ref 142–346)
RBC: 4.85 10*6/uL (ref 3.90–5.10)
RDW: 16 % — ABNORMAL HIGH (ref 11–15)
WBC: 6.98 10*3/uL (ref 3.10–9.50)

## 2019-01-12 LAB — URINALYSIS, REFLEX TO MICROSCOPIC EXAM IF INDICATED
Bilirubin, UA: NEGATIVE
Blood, UA: NEGATIVE
Glucose, UA: NEGATIVE
Ketones UA: 20 — AB
Nitrite, UA: NEGATIVE
Protein, UR: NEGATIVE
Specific Gravity UA: >1.035 — AB (ref 1.001–1.035)
Urine pH: 6 (ref 5.0–8.0)
Urobilinogen, UA: NORMAL mg/dL (ref 0.2–2.0)

## 2019-01-12 LAB — LIPASE: Lipase: <4 U/L — ABNORMAL LOW (ref 8–78)

## 2019-01-12 LAB — HCG, SERUM, QUALITATIVE: Hcg Qualitative: NEGATIVE

## 2019-01-12 LAB — GFR: EGFR: >60

## 2019-01-12 MED ORDER — ONDANSETRON HCL 4 MG/2ML IJ SOLN
4.00 mg | Freq: Once | INTRAMUSCULAR | Status: AC
Start: 2019-01-12 — End: 2019-01-12
  Administered 2019-01-12: 4 mg via INTRAVENOUS
  Filled 2019-01-12: qty 2

## 2019-01-12 MED ORDER — SODIUM CHLORIDE 0.9 % IV BOLUS
1000.00 mL | Freq: Once | INTRAVENOUS | Status: AC
Start: 2019-01-12 — End: 2019-01-12
  Administered 2019-01-12: 1000 mL via INTRAVENOUS

## 2019-01-12 MED ORDER — HYDROMORPHONE HCL 0.5 MG/0.5 ML IJ SOLN
0.50 mg | Freq: Once | INTRAMUSCULAR | Status: AC
Start: 2019-01-12 — End: 2019-01-12
  Administered 2019-01-12: 0.5 mg via INTRAVENOUS
  Filled 2019-01-12: qty 0.5

## 2019-01-12 MED ORDER — IOHEXOL 350 MG/ML IV SOLN
100.00 mL | Freq: Once | INTRAVENOUS | Status: AC | PRN
Start: 2019-01-12 — End: 2019-01-12
  Administered 2019-01-12: 100 mL via INTRAVENOUS

## 2019-01-12 MED ORDER — MORPHINE SULFATE 4 MG/ML IJ/IV SOLN (WRAP)
4.0000 mg | Freq: Once | Status: AC
Start: 2019-01-12 — End: 2019-01-12
  Administered 2019-01-12: 4 mg via INTRAVENOUS
  Filled 2019-01-12: qty 1

## 2019-01-12 NOTE — ED Notes (Signed)
Pt ambulatory to room 9. AAOx4. Pt c/o RUQ pain and nausea. Pt reports being seen today at [facility] and had labs and abdominal CT complete. Pt discharged. Pt reports pain and nausea not relieved since discharge from [facility].
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
## 2019-01-12 NOTE — EDIE (Signed)
COLLECTIVE?NOTIFICATION?01/12/2019 13:16?KALAN, RINN M?MRN: [id number]    Criteria Met      5 ED Visits in 12 Months    3 Different Facilities in 90 Days    Narx Scores Alert    Security and Safety  No recent Security Events currently on file    ED Care Guidelines  There are currently no ED Care Guidelines for this patient. Please check your facility's medical records system.        Prescription Monitoring Program  671??- Narcotic Use Score  613??- Sedative Use Score  000??- Stimulant Use Score  830??- Overdose Risk Score  - All Scores range from 000-999 with 75% of the population scoring < 200 and on 1% scoring above 650  - The last digit of the narcotic, sedative, and stimulant score indicates the number of active prescriptions of that type  - Higher Use scores correlate with increased prescribers, pharmacies, mg equiv, and overlapping prescriptions  - Higher Overdose Risk Scores correlate with increased risk of unintentional overdose death   Concerning or unexpectedly high scores should prompt a review of the PMP record; this does not constitute checking PMP for prescribing purposes.      E.D. Visit Count (12 mo.)  Facility Visits   [facility] 5   [facility] 1   [facility] 29   [facility] 2   [facility] 1   [facility] 5   Total 43   Note: Visits indicate total known visits.      Recent Emergency Department Visit Summary  Showing 10 most recent visits out of 43 in the past 12 months  Date Facility [facility] Type Diagnoses or Chief Complaint   Jan 12, 2019 [facility] [place] [place] [facility]      Abdominal pain      Dec 22, 2018 [facility] [place] [place] [facility]      abd pain      Abdominal Pain      Crohn's disease, unspecified, without complications      Lower abdominal pain, unspecified      Dec 20, 2018 [facility] [place] [place] [facility]      1.  Generalized abdominal pain      1. Crohn's disease, unspecified, without complications      2. Tachycardia, unspecified      3. Elevated white blood cell count, unspecified      4. Nonspec elev of levels of transamns and lactic acid dehydrgnse      5. Elevated C-reactive protein (CRP)      6. Dysuria      7. Personal history of other diseases of urinary system      8. Personal history of other diseases of the digestive system      Dec 19, 2018 [facility] [place] [place] [facility]      Flank Pain      Unspecified abdominal pain      Dec 12, 2018 [facility] [place] [place] [facility]      abdominal pain      Dec 06, 2018 [facility] [place] [place] [facility]      DYSURIA      UTI Symptoms      Nov 28, 2018 [facility]  [place] [place] [facility]      Abdominal Pain      Nov 11, 2018 [facility] [place] [place]  [facility]      abdominal pain      Nov 11, 2018 [facility] [place] [place] [facility]      1. Urinary tract infection, site not specified      1. Generalized abdominal pain      2. Personal history of other infectious and parasitic diseases      3. Personal history of other diseases of the digestive system      Oct 30, 2018 [facility] [place] [place] [facility]      Abdominal Pain      Diarrhea          Recent Inpatient Visit Summary  Date Facility [facility] Type Diagnoses or Chief Complaint   Sep 06, 2018 [facility] [place] [place] [facility]  Chief Complaint: PYELO IN SETTING OF CROHN    Jun 23, 2018 [facility] [place] [place] [facility]  Chief Complaint: C DIFF INFECTION/DIARRHE        Care Team  Provider Specialty Phone Fax Service Dates   [facility]   Current      Collective Portal  This patient has registered at the [facility] Emergency Department   For more information visit:  [email or web address]     PLEASE NOTE:     1.   Any care recommendations and other clinical information are provided as guidelines or for historical purposes only, and providers should exercise their own clinical judgment when providing care.    2.   You may only use this information for purposes of treatment, payment or health care operations activities, and subject to the limitations of applicable Collective Policies.    3.   You should consult directly with the organization that provided a care guideline or other clinical history with any questions about additional information or accuracy or completeness of information provided.    ? 2020 [place], [employer] - [email or web address]

## 2019-01-12 NOTE — ED Provider Notes (Signed)
Physician/Midlevel provider first contact with patient: 01/12/19 1458       EMERGENCY DEPARTMENT HISTORY AND PHYSICAL EXAM    Date: 01/12/19  Patient Name: [name]  Attending Physician: Blanche East, MD  Patient DOB:  [birth year]  MRN:  [id number]  Room:  R03/R03        History of Presenting Illness     Chief Complaint:    Chief Complaint   Patient presents with   . Abdominal Pain   . Epigastric pain       Historian:  Patient    33 y.o. female with h/o Crohn's disease p/w acute onset of constant "stabbing" epigastric and RUQ pain beginning last night. Associated with 5 episodes of vomiting last night. Pt states her pain "does not feel like a Crohn's flare up." Pt reports her pain radiates across her right flank to her back. Pt states that she recently moved to the area and is not currently seeing a GI. Pt reports she has never had pain like this before. Denies diarrhea.       PMD: Brita Romp, MD      Past Medical History     Past Medical History:   Diagnosis Date   . Abortion    . Alcohol abuse    . Anxiety    . Crohn's disease    . Depression    . H/O Hashimoto thyroiditis     hypothyroidism   . PCOS (polycystic ovarian syndrome)    . Tachycardia        Past Surgical History     Past Surgical History:   Procedure Laterality Date   . BREAST IMPLANT     . COLONOSCOPY     . D & C, SUCTION N/A 08/24/2014    Procedure: D & C, SUCTION;  Surgeon: Maryruth Hancock, MD;  [facility];  Service: Obstetrics;  Laterality: N/A;   . INDUCED ABORTION         Family History     History reviewed. No pertinent family history.    Social History     Social History     Socioeconomic History   . Marital status: Single     Spouse name: Not on file   . Number of children: Not on file   . Years of education: Not on file   . Highest education level: Not on file   Occupational History   . Not on file   Social Needs   . Financial resource strain: Not on file   . Food insecurity:     Worry: Not on file     Inability: Not  on file   . Transportation needs:     Medical: Not on file     Non-medical: Not on file   Tobacco Use   . Smoking status: Former Smoker     Last attempt to quit: 12/30/2007     Years since quitting: 11.0   . Smokeless tobacco: Never Used   Substance and Sexual Activity   . Alcohol use: Yes   . Drug use: No   . Sexual activity: Yes     Partners: Male     Birth control/protection: Condom   Lifestyle   . Physical activity:     Days per week: Not on file     Minutes per session: Not on file   . Stress: Not on file   Relationships   . Social connections:     Talks on phone: Not  on file     Gets together: Not on file     Attends religious service: Not on file     Active member of club or organization: Not on file     Attends meetings of clubs or organizations: Not on file     Relationship status: Not on file   . Intimate partner violence:     Fear of current or ex partner: Not on file     Emotionally abused: Not on file     Physically abused: Not on file     Forced sexual activity: Not on file   Other Topics Concern   . Not on file   Social History Narrative   . Not on file       Allergies     Allergies   Allergen Reactions   . Bentyl [Dicyclomine] Hives   . Bactrim [Sulfamethoxazole W/Trimethoprim (Co-Trimoxazole)] Rash   . Sulfa Antibiotics Rash       Home Medications     Home medications reviewed by ED MD     Discharge Medication List as of 01/12/2019  4:16 PM      CONTINUE these medications which have NOT CHANGED    Details   chlordiazePOXIDE (LIBRIUM) 5 MG capsule Take 5 capsules (25 mg total) by mouth 3 (three) times daily as needed (withdrawal)., Starting Fri 01/15/2018, Print      FLUoxetine (PROZAC) 40 MG capsule Take 40 mg by mouth daily., Starting Wed 04/08/2017, Historical Med      levothyroxine (SYNTHROID, LEVOTHROID) 88 MCG tablet Take 88 mcg by mouth daily., Until Discontinued, Historical Med      ondansetron (ZOFRAN-ODT) 4 MG disintegrating tablet Take 1 tablet (4 mg total) by mouth every 6 (six) hours as  needed for Nausea, Starting Sun 04/25/2018, Print               Review of Systems     Constitutional:  No fever  Eyes: No discharge   ENT: No ST  CV:  No CP   Resp:  No SOB or cough  GI: +Epigastric abd pain +RUQ abd pain (that radiates to her right flank to her back) +Vomiting. No diarrhea.   GU: No dysuria  Skin: No rash  Neuro:  No HA  Psych:  No behavior changes  All other systems reviewed and negative    Physical Exam     BP 111/70   Pulse 88   Temp 98.6 F (37 C) (Oral)   Resp 16   Wt 49.8 kg   SpO2 97%   BMI 20.74 kg/m     CONSTITUTIONAL Patient is afebrile, Vital signs reviewed.  HEAD Atraumatic, Normocephalic.  EYES No discharge from eyes, Sclera are normal.  NECK   Normal ROM, Cervical spine nontender  RESPIRATORY CHEST Chest is nontender, Breath sounds normal, No respiratory distress.  CARDIOVASCULAR Tachycardic rate, regular rhythm, Heart sounds normal.  ABDOMEN Mild to moderate epigastric and RUQ TTP, No peritoneal signs, No distension  BACK   There is no CVA tenderness, There is no tenderness to palpation  UPPER EXTREMITY No cyanosis, No edema  LOWER EXTREMITY No cyanosis, No edema  NEURO GCS is 15, No focal motor deficits, No focal sensory deficits.  SKIN Skin is warm, Skin is dry.  PSYCHIATRIC Normal affect, Normal insight        Monitors, EKG     EKG (interpreted by ED physician): na    Orders Placed During This Encounter     Orders Placed  This Encounter   Procedures   . CT Abd/Pelvis with IV Contrast only   . Basic Metabolic Panel   . CBC and differential   . Lipase   . Hepatic function panel (LFT)   . UA, Reflex to Microscopic (pts  3 + yrs)   . Beta HCG, Qual, Serum   . GFR   . Saline lock IV         ED Medications Administered     ED Medication Orders (From admission, onward)    Start Ordered     Status Ordering Provider    01/12/19 1607 01/12/19 1606  HYDROmorphone (DILAUDID) injection 0.5 mg  Once     Route: Intravenous  Ordered Dose: 0.5 mg     Last MAR action:  Given Derica Leiber L     01/12/19 1607 01/12/19 1606  ondansetron (ZOFRAN) injection 4 mg  Once     Route: Intravenous  Ordered Dose: 4 mg     Last MAR action:  Given Dashley Monts L    01/12/19 1548 01/12/19 1549  iohexol (OMNIPAQUE) 350 MG/ML injection 100 mL  IMG once as needed     Route: Intravenous  Ordered Dose: 100 mL     Last MAR action:  Imaging Agent Given Hart[place], Amado L    01/12/19 1509 01/12/19 1508  morphine injection 4 mg  Once     Route: Intravenous  Ordered Dose: 4 mg     Last MAR action:  Given Morad Tal L    01/12/19 1509 01/12/19 1508  ondansetron (ZOFRAN) injection 4 mg  Once     Route: Intravenous  Ordered Dose: 4 mg     Last MAR action:  Given Sanaz Scarlett L    01/12/19 1349 01/12/19 1348  sodium chloride 0.9 % bolus 1,000 mL  Once     Route: Intravenous  Ordered Dose: 1,000 mL     Last MAR action:  [employer] L                Data Review     Nursing Records Reviewed and Agree: Yes  Laboratory results reviewed by ED provider: if applicable yes  Radiologic study results reviewed by ED provider:  If applicable yes      I, Blanche East, MD, personally performed the services documented. [name] is scribing for me on [place],[name]. I reviewed and confirm the accuracy of the information in this medical record.    I, [name], am serving as a scribe to document services personally performed by Blanche East, MD, based on the provider's statements to me.     Credentials: Gentry Roch, scribe    Rendering Provider: Blanche East, MD      Diagnostic Study Results     Labs     Results     Procedure Component Value Units Date/Time    Hepatic function panel (LFT) [[id number]]  (Abnormal) Collected:  01/12/19 1420    Specimen:  Blood Updated:  01/12/19 1453     Bilirubin, Total 0.8 mg/dL      Bilirubin, Direct 0.3 mg/dL      Bilirubin, Indirect 0.5 mg/dL      AST (SGOT) 47 U/L      ALT 42 U/L      Alkaline Phosphatase 182 U/L      Protein, Total 9.4 g/dL      Albumin 4.4 g/dL      Globulin 5.0 g/dL      Albumin/Globulin  Ratio 0.9    GFR [[id number]] Collected:  01/12/19 1420     Updated:  01/12/19 1453     EGFR >60.0    Basic Metabolic Panel [[id number]]  (Abnormal) Collected:  01/12/19 1420    Specimen:  Blood Updated:  01/12/19 1453     Glucose 101 mg/dL      BUN 11 mg/dL      Creatinine 0.7 mg/dL      Calcium 78.2 mg/dL      Sodium 956 mEq/L      Potassium 4.5 mEq/L      Chloride 101 mEq/L      CO2 23 mEq/L      Anion Gap 12.0    Lipase [[id number]]  (Abnormal) Collected:  01/12/19 1420    Specimen:  Blood Updated:  01/12/19 1453     Lipase <4 U/L     Beta HCG, Qual, Serum [[id number]] Collected:  01/12/19 1420    Specimen:  Blood Updated:  01/12/19 1440     Hcg Qualitative Negative    CBC and differential [[id number]]  (Abnormal) Collected:  01/12/19 1420    Specimen:  Blood Updated:  01/12/19 1432     WBC 6.98 x10 3/uL      Hgb 13.5 g/dL      Hematocrit 27.2 %      Platelets 543 x10 3/uL      RBC 4.85 x10 6/uL      MCV 88.9 fL      MCH 27.8 pg      MCHC 31.3 g/dL      RDW 16 %      MPV 9.5 fL      Neutrophils 70.2 %      Lymphocytes Automated 19.6 %      Monocytes 8.9 %      Eosinophils Automated 0.6 %      Basophils Automated 0.4 %      Immature Granulocyte 0.3 %      Nucleated RBC 0.0 /100 WBC      Neutrophils Absolute 4.90 x10 3/uL      Abs Lymph Automated 1.37 x10 3/uL      Abs Mono Automated 0.62 x10 3/uL      Abs Eos Automated 0.04 x10 3/uL      Absolute Baso Automated 0.03 x10 3/uL      Absolute Immature Granulocyte 0.02 x10 3/uL      Absolute NRBC 0.00 x10 3/uL           Radiologic Studies  Radiology Results (24 Hour)     Procedure Component Value Units Date/Time    CT Abd/Pelvis with IV Contrast only [[id number]] Collected:  01/12/19 1559    Order Status:  Completed Updated:  01/12/19 1610    Narrative:       HISTORY:  Abdominal pain     TECHNIQUE:  CT abdomen and pelvis before and following IV contrast  administration. 100 cc Omnipaque 350 IV given.    COMPARISON:  CT 08/24/2014      FINDINGS:    Lower thorax:  Incompletely  imaged opacity at the right lung base  (series 2, image 1). There are bilateral breast implants.    Liver: unremarkable     Biliary tree: The gallbladder is present. There is no biliary  ductal dilatation.    Spleen: unremarkable     Pancreas:  Unremarkable.    Adrenal glands:  Unremarkable.    Kidneys:  There are bilateral symmetric nephrograms without  hydronephrosis.  Lymph nodes: No adenopathy.     Vasculature:  The aorta is normal caliber.    Peritoneum/mesentery/omentum:  There is a small amount of free pelvic  fluid. No free air.    GI tract:  There is no bowel obstruction. The rectum is distended  with stool. There is a moderate stool burden throughout the colon. The  appendix is normal.    Pelvic urogenital structures: The bladder is unremarkable. The uterus  is present. There is a right corpus luteum.    Body wall:  The osseous structures are unremarkable.        Impression:             Moderate stool burden. Rectum distended with stool.    Incompletely imaged opacity at the right lung base.     Carolyn Stare, MD   01/12/2019 4:05 PM      .        Procedures     na    Clinical Notes, Consults & Reevaluations, and MDM     Differential Diagnoses:     3:25 PM Early differential includes, but is not limited to: Crohn's flare, Colitis, Diverticulitis, GI bleed, gastroenteritis, peptic ulcer disease, SBO          Consults/Reevaluation:   1530 Review of prior records shows that on 01/07/19 the pt got 42 Percocet, on 12/30/18 she got 30 Xanax, and on 12/27/18 she got 28 belbuca.      4:15 PM Re-eval:   Feeling much better, VS stable,  no acute distress, looks well.     Counseled re dx  Counseled re follow up  Answered all questions  Counseled red flags and signs and sxs to return for.  Comfortable with follow up and discharge plan    Return Precautions  The patient is aware that this evaluation is only a screening for emergent conditions related to his or her symptoms and presentation.   I discussed the need for  prompt follow-up.  Patient demonstrates verbal understanding.  Patient advised to return to the ED for any worsening symptoms, uncontrolled pain if applicable, worsening fevers, or any changes in their condition prompting concern and need for repeat evaluation and/or additional management.        MDM:     4:17 PM Hx, PEx, labs, and imaging were done to evaluate pt. CT negative acute, WBC is normal, Beta is negative. Pt will f/u with her PCP.         Diagnosis and Disposition   Diagnosis/Clinical Impression:  1. Epigastric pain        Disposition  ED Disposition     ED Disposition Condition Date/Time Comment    Discharge  Wed Jan 12, 2019  4:16 PM Denny Peon [facility] discharge to home/self care.    Condition at disposition: Stable          Prescriptions    Discharge Medication List as of 01/12/2019  4:16 PM            Critical Care     Critical care exclusive of time spent performing procedures.    Total time:            Signout If Applicable     Patient signed out to:      Signout notes:               Harden Mo, MD  01/12/19 2047

## 2019-01-12 NOTE — ED Provider Notes (Signed)
[name] H&P      Visit date: 01/12/2019      CLINICAL SUMMARY          Diagnosis:    .     Final diagnoses:   Right upper quadrant abdominal pain         MDM Notes:    Patient presents emergency department with acute on chronic abdominal pain.  Patient has a long history of abdominal pain and on review of the chart she has had 12 CTs for this abdominal pain in the past year.  Patient was seen just several hours ago with unremarkable work-up at the outside hospital.  Patient is currently in pain management and has already finished her narcotic prescription a week early and is currently out of narcotics.  I stressed the importance of following up with her pain specialist and being open and honest with her doctor about how much narcotics she is going through.  Also advised her to follow-up with her GI doctor.  CT scan did show large stool ball burden which most likely is contributing to her discomfort.  Bedside ultrasound right upper quadrant was unremarkable.  I reviewed patient's labs from previous visit including slight elevation of liver enzymes and advised her to have this repeated as an outpatient within the next 2 weeks.  No vomiting while here.  All results/findings were reviewed then discussed with both patient and any family  present and all questions were answered. I rediscussed in depth precautions for which  to return for immediate reeval, and pt expressed understanding.    Myself & staff explained that not all medical issues can be fully evaluated, diagnosed,  and managed in the emergency dept, and reiterated the importance of outpt followup.  Safe and stable for discharge home with fu as discussed  All results/findings were reviewed then discussed with both patient and any family  present and all questions were answered. I rediscussed in depth precautions for which  to return for immediate reeval, and pt expressed understanding.    Myself & staff  explained that not all medical issues can be fully evaluated, diagnosed,  and managed in the emergency dept, and reiterated the importance of outpt followup.  Safe and stable for discharge home with fu as discussed           Disposition:         Discharge          Discharge Prescriptions     None                         CLINICAL INFORMATION        HPI:      Chief Complaint: Abdominal Pain  .    [name] is a 33 y.o. female who presents with abdominal pain  Patient presents the emergency department with right upper quadrant epigastric pain.  Patient states she has a history of chronic abdominal pain for which she sees a GI specialist and a pain management specialist.  Patient states this pain is now in the right upper quadrant and associated with vomiting.  She has normal bowel movements last bowel movement was today.  She went to the [facility] had work-up including CT scan and blood work which was unremarkable felt better at time of discharge and went home however pain returned.  Patient denies any fevers chest pain shortness of breath.  Patient states she ran out of her  prescription narcotics 1 week earlier than scheduled as she had to increase her dose of narcotics last week, even before this new episode of pain started.    History obtained from: patient, review of prior chart          ROS:      Positive and negative ROS elements as per HPI.      Physical Exam:      Pulse (!) 114  BP 130/67  Resp 18  SpO2 100 %  Temp 97.8 F (36.6 C)    Physical Exam  Vitals signs and nursing note reviewed.   Constitutional:       General: She is not in acute distress.     Appearance: She is well-developed. She is not diaphoretic.   HENT:      Head: Normocephalic and atraumatic.   Cardiovascular:      Rate and Rhythm: Normal rate and regular rhythm.      Heart sounds: Normal heart sounds. No murmur. No friction rub. No gallop.       Comments: Heart rate 88  Pulmonary:      Effort: Pulmonary effort is  normal. No respiratory distress.      Breath sounds: Normal breath sounds. No wheezing or rales.   Chest:      Chest wall: No tenderness.   Abdominal:      General: Bowel sounds are normal. There is no distension.      Palpations: Abdomen is soft. There is no mass.      Tenderness: There is generalized abdominal tenderness and tenderness in the right upper quadrant. There is no guarding or rebound.   Musculoskeletal: Normal range of motion.         General: No tenderness.   Neurological:      Mental Status: She is alert.                   PAST HISTORY        Primary Care Provider: Brita Romp, MD        PMH/PSH:    .     Past Medical History:   Diagnosis Date   . Abortion    . Alcohol abuse    . Anxiety    . Crohn's disease    . Depression    . H/O Hashimoto thyroiditis     hypothyroidism   . PCOS (polycystic ovarian syndrome)    . Tachycardia        She has a past surgical history that includes Breast Implant; Induced abortion; D & C, SUCTION (N/A, 08/24/2014); and Colonoscopy.      Social/Family History:      She reports that she quit smoking about 11 years ago. She has never used smokeless tobacco. She reports current alcohol use. She reports that she does not use drugs.    History reviewed. No pertinent family history.      Listed Medications on Arrival:    .     Home Medications     Med List Status:  In Progress Set By: Tracie Harrier, RN at 01/12/2019 11:34 PM                FLUoxetine (PROZAC) 40 MG capsule     Take 40 mg by mouth daily.     levothyroxine (SYNTHROID, LEVOTHROID) 88 MCG tablet     Take 88 mcg by mouth daily.     LORazepam (ATIVAN) 1 MG tablet     Take by mouth  oxyCODONE (ROXICODONE) 10 MG immediate release tablet     Take 20 mg by mouth every 6 (six) hours as needed     zolpidem (AMBIEN CR) 12.5 MG CR tablet     Take 12.5 mg by mouth nightly as needed for Sleep         Allergies: She is allergic to bentyl [dicyclomine]; bactrim [sulfamethoxazole w/trimethoprim (co-trimoxazole)]; and sulfa  antibiotics.            VISIT INFORMATION        Clinical Course in the ED:            Medications Given in the ED:    .     ED Medication Orders (From admission, onward)    Start Ordered     Status Ordering Provider    01/13/19 0045 01/13/19 0044  oxyCODONE (ROXICODONE) immediate release tablet 10 mg  Once     Route: Oral  Ordered Dose: 10 mg     Last MAR action:  Given Kenden Brandt E    01/13/19 0006 01/13/19 0005  ondansetron (ZOFRAN-ODT) disintegrating tablet 4 mg  Once     Route: Oral  Ordered Dose: 4 mg     Last MAR action:  Given Gerren Hoffmeier E            Procedures:      Procedures  Limited bedside ultrasound of the right upper quadrant done and interpreted by me shows unremarkable gallbladder ultrasound, anterior wall measures 0.29 cm, no cholelithiasis, CBD measures 0.25 cm, no pericholecystic fluid    Interpretations:      O2 sat-           saturation: 100 %; Oxygen use: room air; Interpretation: Normal                   RESULTS        Recent Lab Results:      Results     ** No results found for the last 24 hours. **              Radiology Results:      No orders to display               Scribe Attestation:      No scribe involved in the care of this patient                            Hurshel Keys, MD  01/13/19 [phone]

## 2019-01-12 NOTE — ED Triage Notes (Signed)
Pt reports right upper quadrant pain that started last night. Pain started last night and radiates to epigastric area. + nausea pt took 8mg  zofran prior to arrival with no relief. No alcohol yesterday. No fried foods prior to pain starting.

## 2019-01-12 NOTE — Discharge Instructions (Signed)
Dear [name]:    I appreciate your choosing the [facility] for your healthcare needs, and hope your visit today was EXCELLENT.    Instructions:  Please follow-up with your primary care provider in 1-2 days.     Return to the Emergency Department for any worsening symptoms or concerns.    Below is some information that our patients often find helpful.    We wish you good health and please do not hesitate to contact us if we can ever be of any assistance.    Sincerely,  Harden Mo, MD  [facility]    ________________________________________________________________    If you do not continue to improve or your condition worsens, please contact your doctor or return immediately to the Emergency Department.    Thank you for choosing [facility] for your emergency care needs.  We strive to provide EXCELLENT care to you and your family.      DOCTOR REFERRALS  Call [phone] if you need any further referrals and we can help you find a primary care doctor or specialist.  Also, available online at:  [email or web address]    YOUR CONTACT INFORMATION  Before leaving please check with registration to make sure we have an up-to-date contact number.  You can call registration at [phone] to update your information.  For questions about your hospital bill, please call [phone].  For questions about your Emergency Dept Physician bill please call [phone].      FREE HEALTH SERVICES  If you need help with health or social services, please call 2-1-1 for a free referral to resources in your area.  2-1-1 is a free service connecting people with information on health insurance, free clinics, pregnancy, mental health, dental care, food assistance, housing, and substance abuse counseling.  Also, available online at:  http://www.[place].org    MEDICAL RECORDS AND TESTS  Certain laboratory test results do not come back the  same day, for example urine cultures.   We will contact you if other important findings are noted.  Radiology films are often reviewed again to ensure accuracy.  If there is any discrepancy, we will notify you.      Please call [phone] to pick up a complimentary CD of any radiology studies performed.  If you or your doctor would like to request a copy of your medical records, please call [phone].      ORTHOPEDIC INJURY   Please know that significant injuries can exist even when an initial x-ray is read as normal or negative.  This can occur because some fractures (broken bones) are not initially visible on x-rays.  For this reason, close outpatient follow-up with your primary care doctor or bone specialist (orthopedist) is required.    MEDICATIONS AND FOLLOWUP  Please be aware that some prescription medications can cause drowsiness.  Use caution when driving or operating machinery.    The examination and treatment you have received in our Emergency Department is provided on an emergency basis, and is not intended to be a substitute for your primary care physician.  It is important that your doctor checks you again and that you report any new or remaining problems at that time.      24 HOUR PHARMACIES  [facility] [address], [place], [place] [address] (1.4 miles, 7 minutes)  [facility] - [address], [place], [place] [address] (6.5 miles, 13 minutes)  Handout with  directions available on request    PATIENT RELATIONS  If you have any concerns, issues, or feedback related to your care, positive or negative, please do not hesitate to contact Patient Relations at [phone]. They are open from 8:30AM-5:00PM Monday through [name].

## 2019-01-13 MED ORDER — OXYCODONE HCL 5 MG PO TABS
10.00 mg | ORAL_TABLET | Freq: Once | ORAL | Status: AC
Start: 2019-01-13 — End: 2019-01-13
  Administered 2019-01-13: 10 mg via ORAL
  Filled 2019-01-13: qty 2

## 2019-01-13 MED ORDER — ONDANSETRON 4 MG PO TBDP
4.00 mg | ORAL_TABLET | Freq: Once | ORAL | Status: AC
Start: 2019-01-13 — End: 2019-01-13
  Administered 2019-01-13: 4 mg via ORAL
  Filled 2019-01-13: qty 1

## 2019-01-13 NOTE — Discharge Instructions (Signed)
Dear [name]:    Thank you for choosing one of [facility] emergency departments.  I hope your visit today was EXCELLENT.    Specific instructions for your visit today:      Abdominal Pain    You have been diagnosed with abdominal (belly) pain. The cause of your pain is not yet known.    Many things can cause abdominal pain such as infections and bowel (intestine) spasms. You might need another examination or more tests to find out why you have pain.    At this time, your pain does not seem to be caused by anything dangerous. You do not need surgery. You do not need to stay in the hospital.     Though we don't believe your condition is dangerous right now, it is important to be careful. Sometimes a problem that seems mild now can become serious later. If you do not get completely better or your symptoms get worse, you should seek more care. This is why it is important that you get additional help unless you are 100% improved   Follow up with your doctor.    For the next 24 hours, Drink only clear liquids such as:   Water.   Clear broth.   Sports drinks.   Clear caffeine-free soft drinks, like 7-Up or Sprite.    Return here or go to the nearest Emergency Department immediately if:   Your pain does not go away or gets worse.   You cannot keep fluids down    Your vomit (throw up) is dark green.    You vomit (throw up) blood or see blood in your stool (poop). Blood might be bright red or dark red. It can also be black and look like tar.   You have a fever (temperature higher than 100.12F / 38C) or shaking chills.   Your skin or eyes look yellow.   Your urine looks brown.   You have severe diarrhea.    If you can't follow up with your doctor, or if at any time you feel you need to be rechecked or seen again, come back here or go to the nearest emergency department.        IF YOU DO NOT CONTINUE TO IMPROVE OR YOUR CONDITION WORSENS, PLEASE CONTACT YOUR DOCTOR OR RETURN  IMMEDIATELY TO THE EMERGENCY DEPARTMENT.    Sincerely,  [name], Lyman Speller, MD  Attending Emergency Physician  [facility] Emergency Department    OBTAINING A PRIMARY CARE APPOINTMENT    Primary care physicians (PCPs, also known as primary care doctors) are either internists or family medicine doctors. Both types of PCPs focus on health promotion, disease prevention, patient education and counseling, and treatment of acute and chronic medical conditions.    Call for an appointment with a primary care doctor.  Ask to see who is taking new patients.     [facility]  telephone:  [phone]  [email or web address]    For a pediatrician, call the [facility] referral line below.  You can also call to make an appointment at [facility] (except [employer] and [facility]):    [address]  [place], [place] [address]  [phone]    [name]  Call [phone] (available 24 hours a day, 7 days a week) if you need any further referrals and we can help you find a primary care doctor or specialist.  Also, available online at:  [email or web address]  For more information regarding our services at [facility], please call the number above or visit the website [email or web address]    YOUR CONTACT INFORMATION  Before leaving please check with registration to make sure we have an up-to-date contact number.  You can call registration at [phone], Option 7 [facility] location) or [phone], Option 1 [name] location) to update your information.  For questions about your hospital bill, please call [phone].  For questions about your Emergency Dept Physician bill please call [phone].      FREE HEALTH SERVICES  If you need help with health or social services, please call 2-1-1 for a free referral to resources in your area.  2-1-1 is a free service connecting people with information on health insurance, free clinics,  pregnancy, mental health, dental care, food assistance, housing, and substance abuse counseling.  Also, available online at:  http://www.[facility].org    MEDICAL RECORDS AND TESTS  Certain laboratory test results do not come back the same day, for example urine cultures.   We will contact you if other important findings are noted.  Radiology films are often reviewed again to ensure accuracy.  If there is any discrepancy, we will notify you.      Please call [phone] [facility] location) or [phone] ([place] location) to pick up a complimentary CD of any radiology studies performed.  If you or your doctor would like to request a copy of your medical records, please call [phone].      ORTHOPEDIC INJURY   Please know that significant injuries can exist even when an initial x-ray is read as normal or negative.  This can occur because some fractures (broken bones) are not initially visible on x-rays.  For this reason, close outpatient follow-up with your primary care doctor or bone specialist (orthopedist) is required.    MEDICATIONS AND FOLLOWUP  Please be aware that some prescription medications can cause drowsiness.  Use caution when driving or operating machinery.    The examination and treatment you have received in our Emergency Department is provided on an emergency basis, and is not intended to be a substitute for your primary care physician.  It is important that your doctor checks you again and that you report any new or remaining problems at that time.      24 HOUR PHARMACIES  Two nearby 24 hour pharmacies are:    [facility]  [address]  [place], [place] [address]  [phone]    [facility]  [address]  [place], [place] [address]  [phone]      ASSISTANCE WITH INSURANCE    Affordable Care Act  [facility])  Call to start or finish an application, compare plans, enroll or ask a question.  [phone]  TTY: 3868015567  Web:  Healthcare.gov    Help Enrolling in  [facility]  [employer] [place]  226-540-6780 (TOLL-FREE)  ([phone] (TTY)  Web:  Http://www.coverva.org    Local Help Enrolling in the [facility]  [place] [facility]  [phone] (MAIN)  Email:  health-help@nvfs .org  Web:  BlackjackMyths.is  Address:  [address], [address] [place], [place] [address]    SEDATING MEDICATIONS  Sedating medications include strong pain medications (e.g. narcotics), muscle relaxers, benzodiazepines (used for anxiety and as muscle relaxers), Benadryl/diphenhydramine and other antihistamines for allergic reactions/itching, and other medications.  If you are unsure if you have received a sedating medication, please ask your physician or nurse.  If you received a sedating medication: DO NOT  drive a car. DO NOT operate machinery. DO NOT perform jobs where you need to be alert.  DO NOT drink alcoholic beverages while taking this medicine.     If you get dizzy, sit or lie down at the first signs. Be careful going up and down stairs.  Be extra careful to prevent falls.     Never give this medicine to others.     Keep this medicine out of reach of children.     Do not take or save old medicines. Throw them away when outdated.     Keep all medicines in a cool, dry place. DO NOT keep them in your bathroom medicine cabinet or in a cabinet above the stove.    MEDICATION REFILLS  Please be aware that we cannot refill any prescriptions through the ER. If you need further treatment from what is provided at your ER visit, please follow up with your primary care doctor or your pain management specialist.

## 2019-02-10 ENCOUNTER — Emergency Department
Admission: EM | Admit: 2019-02-10 | Discharge: 2019-02-10 | Disposition: A | Discharge status: Home / Self Care | Payer: BC Managed Care – PPO | Attending: Emergency Medicine | Admitting: Emergency Medicine

## 2019-02-10 DIAGNOSIS — R109 Unspecified abdominal pain: Secondary | ICD-10-CM | POA: Insufficient documentation

## 2019-02-10 DIAGNOSIS — G8929 Other chronic pain: Secondary | ICD-10-CM | POA: Insufficient documentation

## 2019-02-10 DIAGNOSIS — E063 Autoimmune thyroiditis: Secondary | ICD-10-CM | POA: Insufficient documentation

## 2019-02-10 DIAGNOSIS — G4700 Insomnia, unspecified: Secondary | ICD-10-CM | POA: Insufficient documentation

## 2019-02-10 HISTORY — DX: Intrauterine synechiae: N85.6

## 2019-02-10 LAB — POCT PREGNANCY TEST, URINE HCG: POCT Pregnancy HCG Test, UR: NEGATIVE

## 2019-02-10 MED ORDER — PROMETHAZINE HCL 12.5 MG RE SUPP
12.50 mg | Freq: Four times a day (QID) | RECTAL | 0 refills | Status: AC | PRN
Start: 2019-02-10 — End: ?

## 2019-02-10 MED ORDER — OXYCODONE HCL 5 MG PO TABS
10.00 mg | ORAL_TABLET | Freq: Once | ORAL | Status: AC
Start: 2019-02-10 — End: 2019-02-10
  Administered 2019-02-10: 10 mg via ORAL
  Filled 2019-02-10: qty 2

## 2019-02-10 NOTE — Discharge Instructions (Signed)
Dear [name]:    Thank you for choosing the [facility] Emergency Department, the premier emergency department in the [place] area.  I hope your visit today was EXCELLENT.    Specific instructions for your visit today:      Abdominal Pain    You have been diagnosed with abdominal (belly) pain. The cause of your pain is not yet known.    Many things can cause abdominal pain such as infections and bowel (intestine) spasms. You might need another examination or more tests to find out why you have pain.    At this time, your pain does not seem to be caused by anything dangerous. You do not need surgery. You do not need to stay in the hospital.     Though we don't believe your condition is dangerous right now, it is important to be careful. Sometimes a problem that seems mild now can become serious later. If you do not get completely better or your symptoms get worse, you should seek more care. This is why it is important that you get additional help unless you are 100% improved   Follow up with your doctor.    For the next 24 hours, Drink only clear liquids such as:   Water.   Clear broth.   Sports drinks.   Clear caffeine-free soft drinks, like 7-Up or Sprite.    Return here or go to the nearest Emergency Department immediately if:   Your pain does not go away or gets worse.   You cannot keep fluids down    Your vomit (throw up) is dark green.    You vomit (throw up) blood or see blood in your stool (poop). Blood might be bright red or dark red. It can also be black and look like tar.   You have a fever (temperature higher than 100.73F / 38C) or shaking chills.   Your skin or eyes look yellow.   Your urine looks brown.   You have severe diarrhea.    If you can't follow up with your doctor, or if at any time you feel you need to be rechecked or seen again, come back here or go to the nearest emergency department.                 IF YOU DO NOT CONTINUE TO IMPROVE OR YOUR CONDITION WORSENS,  PLEASE CONTACT YOUR DOCTOR OR RETURN IMMEDIATELY TO THE EMERGENCY DEPARTMENT.    Sincerely,  No att. providers found  Attending Emergency Physician  [facility] Emergency Department    ONSITE PHARMACY  Our full service onsite pharmacy is located in the ER waiting room.  Open 7 days a week from 9 am to 9 pm.  We accept all major insurances and prices are competitive with major retailers.  Ask your provider to print your prescriptions down to the pharmacy to speed you on your way home.    OBTAINING A PRIMARY CARE APPOINTMENT    Primary care physicians (PCPs, also known as primary care doctors) are either internists or family medicine doctors. Both types of PCPs focus on health promotion, disease prevention, patient education and counseling, and treatment of acute and chronic medical conditions.    Call for an appointment with a primary care doctor.  Ask to see who is taking new patients.     [facility]  telephone:  [phone]  [email or web address]    DOCTOR REFERRALS  Call [phone] (available 24 hours a day, 7  days a week) if you need any further referrals and we can help you find a primary care doctor or specialist.  Also, available online at:  [email or web address]    YOUR CONTACT INFORMATION  Before leaving please check with registration to make sure we have an up-to-date contact number.  You can call registration at [phone] to update your information.  For questions about your hospital bill, please call [phone].  For questions about your Emergency Dept Physician bill please call [phone].      [place]  If you need help with health or social services, please call 2-1-1 for a free referral to resources in your area.  2-1-1 is a free service connecting people with information on health insurance, free clinics, pregnancy, mental health, dental care, food assistance, housing, and substance abuse counseling.  Also, available online at:   http://www.211[place].org    MEDICAL RECORDS AND TESTS  Certain laboratory test results do not come back the same day, for example urine cultures.   We will contact you if other important findings are noted.  Radiology films are often reviewed again to ensure accuracy.  If there is any discrepancy, we will notify you.      Please call [phone] to pick up a complimentary CD of any radiology studies performed.  If you or your doctor would like to request a copy of your medical records, please call [phone].      ORTHOPEDIC INJURY   Please know that significant injuries can exist even when an initial x-ray is read as normal or negative.  This can occur because some fractures (broken bones) are not initially visible on x-rays.  For this reason, close outpatient follow-up with your primary care doctor or bone specialist (orthopedist) is required.    MEDICATIONS AND FOLLOWUP  Please be aware that some prescription medications can cause drowsiness.  Use caution when driving or operating machinery.    The examination and treatment you have received in our Emergency Department is provided on an emergency basis, and is not intended to be a substitute for your primary care physician.  It is important that your doctor checks you again and that you report any new or remaining problems at that time.      [place]  The nearest 24 hour pharmacy is:    [facility] [place] [place] [address]  [place] Act  [facility]  Call to start or finish an application, compare plans, enroll or ask a question.  [place]: [phone]  Web:  Healthcare.gov    Help Enrolling in [place]  [phone] (TOLL-FREE)  2108838831 (TTY)  Web:  Http://www.coverva.org    Local Help Enrolling in the [place]  458-412-5991 (MAIN)  Email:  health-help@nvfs .org  Web:   http://lewis-perez.info/  Address:  [address], [address] [place], [place] [address]    SEDATING MEDICATIONS  Sedating medications include strong pain medications (e.g. narcotics), muscle relaxers, benzodiazepines (used for anxiety and as muscle relaxers), Benadryl/diphenhydramine and other antihistamines for allergic reactions/itching, and other medications.  If you are unsure if you have received a sedating medication, please ask your physician or nurse.  If you received a sedating medication: DO NOT drive a car. DO NOT operate machinery. DO NOT perform jobs where you need to be alert.  DO NOT drink alcoholic beverages while  taking this medicine.     If you get dizzy, sit or lie down at the first signs. Be careful going up and down stairs.  Be extra careful to prevent falls.     Never give this medicine to others.     Keep this medicine out of reach of children.     Do not take or save old medicines. Throw them away when outdated.     Keep all medicines in a cool, dry place. DO NOT keep them in your bathroom medicine cabinet or in a cabinet above the stove.    MEDICATION REFILLS  Please be aware that we cannot refill any prescriptions through the ER. If you need further treatment from what is provided at your ER visit, please follow up with your primary care doctor or your pain management specialist.    [place]  Did you know [facility] has two freestanding ERs located just a few miles away?  [facility] [place] have short wait times, easy free parking directly in front of the building and top patient satisfaction scores - and the same Board Certified Emergency Medicine doctors as [facility].

## 2019-02-10 NOTE — EDIE (Signed)
COLLECTIVE?NOTIFICATION?02/10/2019 03:58?[name], [name]?MRN: [id number]    Criteria Met      3 Different Facilities in 90 Days    Narx Scores Alert    5 ED Visits in 12 Months    Security and Safety  No recent Security Events currently on file    ED Care Guidelines  There are currently no ED Care Guidelines for this patient. Please check your facility's medical records system.        Prescription Monitoring Program  682??- Narcotic Use Score  612??- Sedative Use Score  000??- Stimulant Use Score  780??- Overdose Risk Score  - All Scores range from 000-999 with 75% of the population scoring < 200 and on 1% scoring above 650  - The last digit of the narcotic, sedative, and stimulant score indicates the number of active prescriptions of that type  - Higher Use scores correlate with increased prescribers, pharmacies, mg equiv, and overlapping prescriptions  - Higher Overdose Risk Scores correlate with increased risk of unintentional overdose death   Concerning or unexpectedly high scores should prompt a review of the PMP record; this does not constitute checking PMP for prescribing purposes.      E.D. Visit Count (12 mo.)  Facility Visits   [facility] 6   [facility] 1   [facility] 29   [facility] [facility] 1   [facility] 1   [facility] [place] 1   [facility] 5   [facility] 1   Total 45   Note: Visits indicate total known visits.      Recent Emergency Department Visit Summary  Showing 10 most recent visits out of 45 in the past 12 months  Date Facility [facility] Type Diagnoses or Chief Complaint   Feb 10, 2019 [place] [facility] [place] [place] [facility]      triage-abd pain      Jan 16, 2019 [facility] [place] [place] [facility]      ABD PAIN      Abdominal Pain      Generalized abdominal pain      Nausea with vomiting, unspecified      Diarrhea, unspecified      Jan 16, 2019  [facility] [place] [facility]  Chief Complaint: ABD PAIN    Jan 15, 2019 [facility] [place] [place] [facility]      abd pain      Abdominal Pain      Unspecified abdominal pain      Nausea with vomiting, unspecified      Chronic pain syndrome      Jan 14, 2019 [facility] [place] [place] [facility]      abdominal pain      Vomiting      Right upper quadrant pain      Crohn's disease, unspecified, without complications      Jan 12, 2019 [place] [facility] [place] [place] [facility]      abdominal pain      Right upper quadrant pain      Jan 12, 2019 [facility] [place] [place] [facility]      Abdominal pain      Epigastric pain      Dec 22, 2018 [facility]  [place] [place] [facility]      abd pain      Abdominal Pain  Crohn's disease, unspecified, without complications      Lower abdominal pain, unspecified      Dec 20, 2018 [facility] [place] [facility]      1. Generalized abdominal pain      1. Crohn's disease, unspecified, without complications      2. Tachycardia, unspecified      3. Elevated white blood cell count, unspecified      4. Nonspec elev of levels of transamns and lactic acid dehydrgnse      5. Elevated C-reactive protein (CRP)      6. Dysuria      7. Personal history of other diseases of urinary system      8. Personal history of other diseases of the digestive system      Dec 19, 2018 [facility] [place] [place] [facility]      Flank Pain      Unspecified abdominal pain          Recent Inpatient Visit Summary  Date Facility [facility] Type Diagnoses or Chief Complaint   Sep 06, 2018 [facility] [place] [place] Inpatient  Chief Complaint: PYELO IN SETTING OF CROHN    Jun 23, 2018 [facility] [place]  Inpatient  Chief Complaint: C DIFF INFECTION/DIARRHE        Care Team  Provider Specialty Phone Fax Service Dates   Vivia Ewing  Primary Care   Current      Collective Portal  This patient has registered at the [facility] Emergency Department   For more information visit: [email or web address]     PLEASE NOTE:     1.   Any care recommendations and other clinical information are provided as guidelines or for historical purposes only, and providers should exercise their own clinical judgment when providing care.    2.   You may only use this information for purposes of treatment, payment or health care operations activities, and subject to the limitations of applicable Collective Policies.    3.   You should consult directly with the organization that provided a care guideline or other clinical history with any questions about additional information or accuracy or completeness of information provided.    ? 2020 [place], [employer]. - [email or web address]

## 2019-02-10 NOTE — ED Provider Notes (Signed)
[name] EMERGENCY DEPARTMENT H&P      Visit date: 02/10/2019      CLINICAL SUMMARY           Diagnosis:    .     Final diagnoses:   Chronic abdominal pain         MDM Notes:    Patient presents with acute worsening of her chronic abdominal pain.  Patient is in pain management for this problem.  Patient has had multiple unremarkable work-ups for this problem.  Patient states it is chronic and it is the same as previous episodes.  Patient has been on Phenergan suppositories in the past with some improvement.  She was given a brief prescription refill for this and encouraged to follow-up in the outpatient setting.  Patient is scheduled to see GYN for possible surgical intervention for her ongoing chronic abdominal pain.  All results/findings were reviewed then discussed with both patient and any family  present and all questions were answered. I rediscussed in depth precautions for which  to return for immediate reeval, and pt expressed understanding.    Myself & staff explained that not all medical issues can be fully evaluated, diagnosed,  and managed in the emergency dept, and reiterated the importance of outpt followup.  Safe and stable for discharge home with fu as discussed           Disposition:         Discharge          Discharge Prescriptions     Medication Sig Dispense Auth. Provider    promethazine (PHENERGAN) 12.5 MG suppository Place 1 suppository (12.5 mg total) rectally every 6 (six) hours as needed for Nausea (as needed for severe vomiting) 6 suppository Kaiyden Simkin, Lyman Speller, MD                         CLINICAL INFORMATION        HPI:      Chief Complaint: Abdominal Pain  .    [name] is a 33 y.o. female w/ PMH of alcohol abuse, anxiety, asherman's syndrome, crohn's disease, depression, hx of hashimoto thyroiditis, PCOS who presents with chronic abd pain that occurs once a month that occurred today. Pt reports recently following up with a OB/GYN who referred  her GYN/Onc surgery who recommend pt to have a total hysterectomy and suspect endometriosis. Pt saw her pain management doctor 2 days ago who increased her Belbuca. Pt last took oxycodone 10 hours ago, and belbuca 5 hours ago. Pt has been having normal BM once per day recently, no blood, on stool softeners.    No fever, cough.    Pt has an appointment with her PCP tomorrow.    History obtained from: Patient          ROS:      Positive and negative ROS elements as per HPI.  All other systems reviewed and negative.      Physical Exam:      Pulse (!) 102  BP 129/75  Resp 17  SpO2 99 %  Temp 97.9 F (36.6 C)    Physical Exam   Constitutional: She appears well-developed and well-nourished. No distress.   HENT:   Head: Normocephalic and atraumatic.   Cardiovascular: Normal rate, regular rhythm, normal heart sounds and intact distal pulses. Exam reveals no gallop and no friction rub.   No murmur heard.  Pulmonary/Chest: Effort normal and breath sounds normal. No respiratory  distress. She has no wheezes. She has no rales. She exhibits no tenderness.   Abdominal: Soft. Bowel sounds are normal. She exhibits no distension and no mass. There is abdominal tenderness. There is no rebound and no guarding.   Low abd tenderness.   Musculoskeletal: Normal range of motion.         General: No tenderness or edema.   Neurological: She is alert.   Skin: She is not diaphoretic.   Nursing note and vitals reviewed.                 PAST HISTORY        Primary Care Provider: Brita Romp, MD        PMH/PSH:    .     Past Medical History:   Diagnosis Date   . Abortion    . Alcohol abuse    . Anxiety    . Asherman's syndrome    . Crohn's disease    . Depression    . H/O Hashimoto thyroiditis     hypothyroidism   . PCOS (polycystic ovarian syndrome)    . Tachycardia        She has a past surgical history that includes Breast Implant; Induced abortion; D & C, SUCTION (N/A, 08/24/2014); and Colonoscopy.      Social/Family History:      She  reports that she quit smoking about 11 years ago. She has never used smokeless tobacco. She reports current alcohol use. She reports that she does not use drugs.    History reviewed. No pertinent family history.      Listed Medications on Arrival:    .     Home Medications     Med List Status:  Complete Set By: Kathi Ludwig, RN at 02/10/2019  4:12 AM                ALPRAZolam (XANAX) 1 MG tablet     Take 1 mg by mouth nightly as needed     Buprenorphine HCl (BELBUCA) 600 MCG Film     Place inside cheek     FLUoxetine (PROZAC) 40 MG capsule     Take 40 mg by mouth daily.     levothyroxine (SYNTHROID, LEVOTHROID) 88 MCG tablet     Take 88 mcg by mouth daily.     LORazepam (ATIVAN) 1 MG tablet     Take by mouth     oxyCODONE (ROXICODONE) 10 MG immediate release tablet     Take 20 mg by mouth every 6 (six) hours as needed     zolpidem (AMBIEN CR) 12.5 MG CR tablet     Take 12.5 mg by mouth nightly as needed for Sleep         Allergies: She is allergic to bentyl [dicyclomine]; bactrim [sulfamethoxazole w/trimethoprim (co-trimoxazole)]; and sulfa antibiotics.            VISIT INFORMATION        Clinical Course in the ED:                 Medications Given in the ED:    .     ED Medication Orders (From admission, onward)    Start Ordered     Status Ordering Provider    02/10/19 [phone] 02/10/19 0436  oxyCODONE (ROXICODONE) immediate release tablet 10 mg  Once     Route: Oral  Ordered Dose: 10 mg     Last MAR action:  Given Kais Monje E  Procedures:      Procedures      Interpretations:      O2 sat-           saturation: 99 %; Oxygen use: room air; Interpretation: Normal                   RESULTS        Lab Results:      Results     Procedure Component Value Units Date/Time    Urine HCG, POC/ Qualitative [[id number]] Collected:  02/10/19 0447    Specimen:  Urine Updated:  02/10/19 0447     POCT QC Pass     POCT Pregnancy HCG Test, UR Negative     Comment: Negative Value is Normal in Healthy Males or  Healthy non-pregnant Females              Radiology Results:      No orders to display               Scribe Attestation:      I was acting as a Neurosurgeon for [employer], Lyman Speller, MD on [place],Sharesa MARIE  Treatment Team: Scribe: Sharol Roussel     I am the first provider for this patient and I personally performed the services documented. Treatment Team: Scribe: Sharol Roussel is scribing for me on [name] MARIE. This note and the patient instructions accurately reflect work and decisions made by me.  Taydon Nasworthy, Lyman Speller, MD          Lacie Landry, Lyman Speller, MD  02/14/19 [phone]

## 2019-02-27 ENCOUNTER — Emergency Department
Admission: EM | Admit: 2019-02-27 | Discharge: 2019-02-27 | Disposition: A | Discharge status: Home / Self Care | Payer: BC Managed Care – PPO | Attending: Emergency Medical Services | Admitting: Emergency Medical Services

## 2019-02-27 DIAGNOSIS — R102 Pelvic and perineal pain: Secondary | ICD-10-CM

## 2019-02-27 DIAGNOSIS — K5090 Crohn's disease, unspecified, without complications: Secondary | ICD-10-CM | POA: Insufficient documentation

## 2019-02-27 DIAGNOSIS — E063 Autoimmune thyroiditis: Secondary | ICD-10-CM | POA: Insufficient documentation

## 2019-02-27 DIAGNOSIS — R1030 Lower abdominal pain, unspecified: Secondary | ICD-10-CM | POA: Insufficient documentation

## 2019-02-27 DIAGNOSIS — E282 Polycystic ovarian syndrome: Secondary | ICD-10-CM | POA: Insufficient documentation

## 2019-02-27 DIAGNOSIS — N856 Intrauterine synechiae: Secondary | ICD-10-CM | POA: Insufficient documentation

## 2019-02-27 DIAGNOSIS — N390 Urinary tract infection, site not specified: Secondary | ICD-10-CM

## 2019-02-27 LAB — URINALYSIS REFLEX TO MICROSCOPIC EXAM - REFLEX TO CULTURE
Bilirubin, UA: NEGATIVE
Blood, UA: NEGATIVE
Glucose, UA: NEGATIVE
Nitrite, UA: POSITIVE — AB
Protein, UR: 30 — AB
Specific Gravity UA: 1.025 (ref 1.001–1.035)
Urine pH: 6 (ref 5.0–8.0)
Urobilinogen, UA: NORMAL mg/dL (ref 0.2–2.0)

## 2019-02-27 LAB — URINE HCG QUALITATIVE: Urine HCG Qualitative: NEGATIVE

## 2019-02-27 MED ORDER — CEPHALEXIN 500 MG PO CAPS
500.00 mg | ORAL_CAPSULE | Freq: Four times a day (QID) | ORAL | 0 refills | Status: AC
Start: 2019-02-27 — End: 2019-03-06

## 2019-02-27 MED ORDER — CEPHALEXIN 500 MG PO CAPS
500.00 mg | ORAL_CAPSULE | Freq: Once | ORAL | Status: AC
Start: 2019-02-27 — End: 2019-02-27
  Administered 2019-02-27: 500 mg via ORAL
  Filled 2019-02-27: qty 1

## 2019-02-27 MED ORDER — OXYCODONE-ACETAMINOPHEN 5-325 MG PO TABS
2.00 | ORAL_TABLET | Freq: Once | ORAL | Status: AC
Start: 2019-02-27 — End: 2019-02-27
  Administered 2019-02-27: 2 via ORAL
  Filled 2019-02-27: qty 2

## 2019-02-27 NOTE — ED Provider Notes (Signed)
Physician/Midlevel provider first contact with patient: 02/27/19 0945         EMERGENCY DEPARTMENT HISTORY AND PHYSICAL EXAM    Date: 02/27/19  Patient Name: [name] [name]  Attending Physician: Alexander-Nicholas D. Zaineb Nowaczyk, MD      History of Presenting Illness     Chief Complaint:   Chief Complaint   Patient presents with   . Abdominal Pain       Historian: Patient.     33 y.o. female with h/o Asherman's syndrome, Crohn's disease, induced abortion, PCOS, hypothyroidism, alcohol abuse, anxiety, depression, p/w gradual onset of acute on chronic pain to suprapubic region, more concentrated to R side, with latest recurrence 2 days ago. Pt states pain feels like "contractions are worse" than her usual chronic pain, which pt states she has been having for the last 5 years with multiple unremarkable work-ups  Associated with dysuria, urinary incontinence, hematuria, and clear malodorous vaginal discharge. Denies nausea, vomiting, or changes in bowel habits from baseline. Pt further notes negative STD work-up conducted a few weeks ago, with no new sexual partners or prior history of STDs.     Pt was seen by [facility] ED on 02/10/19 for the same sxs and subsequently discharged. Pt states she was then seen by GYN/Onc, who previously recommended a total hysterectomy for possible endometriosis affecting lining of bladder and more increased episodes of urinary incontinence.     Pt states she has been taking Tylenol and Motrin for pain w/o relief. Per pt, she is also seen by pain management, given 10 mg Oxy as needed and Buprenorphine, last dose 2 days ago. Pt states she was told that she can receive pain medications only in the ED and she can not receive prescriptions for narcotics.     PMD:  Brita Romp, MD  GYN-Onc: Abagail Kitchens, MD.     Nursing records reviewed and agree: Yes    Review of Systems     Constitutional:  No fever  Eyes: No discharge   ENT: No ST  CV:  No CP   Resp:  No SOB or cough  GI: +Abd pain. No N, V,  D.   GU: +Dysuria. +Urinary incontinence. +Hematuria. +Clear, malodorous vaginal discharge.   Skin: No rash  Neuro:  No HA  Psych:  No behavior changes  All other systems reviewed and negative    Past Medical History     Past Medical History:   Diagnosis Date   . Abortion    . Alcohol abuse    . Anxiety    . Asherman's syndrome    . Crohn's disease    . Depression    . H/O Hashimoto thyroiditis     hypothyroidism   . PCOS (polycystic ovarian syndrome)    . Tachycardia        Past Surgical History     Past Surgical History:   Procedure Laterality Date   . BREAST IMPLANT     . COLONOSCOPY     . D & C, SUCTION N/A 08/24/2014    Procedure: D & C, SUCTION;  Surgeon: Maryruth Hancock, MD;  [facility];  Service: Obstetrics;  Laterality: N/A;   . INDUCED ABORTION         Family History     No family history on file.    Social History     Social History     Socioeconomic History   . Marital status: Single     Spouse name: Not  on file   . Number of children: Not on file   . Years of education: Not on file   . Highest education level: Not on file   Occupational History   . Not on file   Social Needs   . Financial resource strain: Not on file   . Food insecurity:     Worry: Not on file     Inability: Not on file   . Transportation needs:     Medical: Not on file     Non-medical: Not on file   Tobacco Use   . Smoking status: Former Smoker     Last attempt to quit: 12/30/2007     Years since quitting: 11.1   . Smokeless tobacco: Never Used   Substance and Sexual Activity   . Alcohol use: Yes     Comment: socially   . Drug use: No   . Sexual activity: Yes     Partners: Male     Birth control/protection: Condom   Lifestyle   . Physical activity:     Days per week: Not on file     Minutes per session: Not on file   . Stress: Not on file   Relationships   . Social connections:     Talks on phone: Not on file     Gets together: Not on file     Attends religious service: Not on file     Active member of club or  organization: Not on file     Attends meetings of clubs or organizations: Not on file     Relationship status: Not on file   . Intimate partner violence:     Fear of current or ex partner: Not on file     Emotionally abused: Not on file     Physically abused: Not on file     Forced sexual activity: Not on file   Other Topics Concern   . Not on file   Social History Narrative   . Not on file       Allergies     Allergies   Allergen Reactions   . Bentyl [Dicyclomine] Hives   . Bactrim [Sulfamethoxazole W/Trimethoprim (Co-Trimoxazole)] Rash   . Sulfa Antibiotics Rash       Home Medications     Home medications reviewed by ED MD     Discharge Medication List as of 02/27/2019 10:55 AM      CONTINUE these medications which have NOT CHANGED    Details   ALPRAZolam (XANAX) 1 MG tablet Take 1 mg by mouth nightly as needed, Historical Med      Buprenorphine HCl (BELBUCA) 600 MCG Film Place inside cheek, Historical Med      FLUoxetine (PROZAC) 40 MG capsule Take 40 mg by mouth daily., Starting Wed 04/08/2017, Historical Med      levothyroxine (SYNTHROID, LEVOTHROID) 88 MCG tablet Take 88 mcg by mouth daily., Until Discontinued, Historical Med      LORazepam (ATIVAN) 1 MG tablet Take by mouth, Historical Med      oxyCODONE (ROXICODONE) 10 MG immediate release tablet Take 20 mg by mouth every 6 (six) hours as needed, Historical Med      promethazine (PHENERGAN) 12.5 MG suppository Place 1 suppository (12.5 mg total) rectally every 6 (six) hours as needed for Nausea (as needed for severe vomiting), Starting Thu 02/10/2019, Print      zolpidem (AMBIEN CR) 12.5 MG CR tablet Take 12.5 mg by mouth nightly as needed  for Sleep, Historical Med             ED Medications Administered     ED Medication Orders (From admission, onward)    Start Ordered     Status Ordering Provider    02/27/19 1051 02/27/19 1050  cephalexin (KEFLEX) capsule 500 mg  Once     Route: Oral  Ordered Dose: 500 mg     Last MAR action:  Given Bardia Wangerin,  ALEXANDER-NICHOLAS D    02/27/19 1017 02/27/19 1016  oxyCODONE-acetaminophen (PERCOCET) 5-325 MG per tablet 2 tablet  Once     Route: Oral  Ordered Dose: 2 tablet     Last MAR action:  Given Epifanio Labrador, ALEXANDER-NICHOLAS D            VS     Patient Vitals for the past 24 hrs:   BP Temp Temp src Pulse Resp SpO2 Weight   02/27/19 1100 106/66 -- -- 77 14 98 % --   02/27/19 1032 118/70 -- -- 80 -- 98 % --   02/27/19 0918 118/64 98.4 F (36.9 C) Oral (!) 127 15 98 % --   02/27/19 0912 -- -- -- -- -- -- 49.1 kg       Physical Exam     Constitutional: Vital signs reviewed. Well appearing.  Head: Normocephalic, atraumatic  Eyes: Conjunctiva and sclera are normal.  No injection or discharge. EOMI  Ears, Nose, Throat:  Normal external examination of the nose and ears.    Neck: Normal range of motion. Trachea midline.   Respiratory/Chest:  No respiratory distress.     Cardiovascular: Regular tachy  Abdomen: Soft and non-tender. Neg Murphy's and McBurney's pt ttp. No masses.  No rebound or guarding.  No masses. Mild suprapubic TTP but no pelvic ttp.  Back:   No CVAT  Upper Extremity:  No edema. No cyanosis.  Lower Extremity:  No edema. No cyanosis.  Neurological:  Alert and conversant.  No focal motor deficits by observation; MAE, 5/5 strength x4 ext. Speech normal.  Skin: Warm and dry. No rash.  Psychiatric:  Normal affect.  Normal insight.        Diagnostic Study Results     ECG (interpreted by ED physician): N/A.     Cardiac Monitor (interpreted by ED physician): N/A.     Labs     Results     Procedure Component Value Units Date/Time    UA Reflex to Micro - Reflex to Culture [[id number]]  (Abnormal) Collected:  02/27/19 1027     Updated:  02/27/19 1046     Urine Type Urine, Clean Ca     Color, UA Yellow     Clarity, UA Cloudy     Specific Gravity UA 1.025     Urine pH 6.0     Leukocyte Esterase, UA Large     Nitrite, UA Positive     Protein, UR 30     Glucose, UA Negative     Ketones UA Trace     Urobilinogen, UA Normal mg/dL       Bilirubin, UA Negative     Blood, UA Negative     RBC, UA 11 - 25 /hpf      WBC, UA TNTC /hpf      Squamous Epithelial Cells, Urine 0 - 5 /hpf      Urine Mucus Present    Narrative:       Replace urinary catheter prior to obtaining the urine culture  if it has  been in place for greater than or equal to 14  days:->N/A No Foley  Indications for U/A Reflex to Micro - Reflex to  Culture:->Suprapubic Pain/Tenderness or Dysuria    Beta HCG,Qual,Urine [[id number]] Collected:  02/27/19 1027    Specimen:  Urine Updated:  02/27/19 1040     Urine HCG Qualitative Negative    Narrative:       Replace urinary catheter prior to obtaining the urine culture  if it has been in place for greater than or equal to 14  days:->N/A No Foley  Indications for U/A Reflex to Micro - Reflex to  Culture:->Suprapubic Pain/Tenderness or Dysuria          Radiologic Studies  Radiology Results (24 Hour)     ** No results found for the last 24 hours. **      .    Data Review     Laboratory results reviewed by ED provider:  yes  Radiologic study results reviewed by ED provider:  na    Scribe and MD Attestations     I, Alexander-Nicholas D. Vicktoria Muckey, MD, personally performed the services documented.  Octavio Manns is scribing for me on Camposano,Saylah [name]. I reviewed and confirm the accuracy of the information in this medical record.     I, Octavio Manns, am serving as a scribe to document services personally performed by Alexander-Nicholas D. Ramiz Turpin, MD, based on the provider's statements to me.     Credentials: Octavio Manns, scribe    Rendering Provider: Alexander-Nicholas D. Kaedance Magos, MD    MDM and Clinical Notes     MDM: Pt with chronic abd pain, who notes she is coming in with acute on chronic sxs. She notes this is similar to episodes in the past, even recently evaluated by [name] and GYN. Pt agrees with no additional labwork or imaging today. She last had an unremarkable of abd-pelvis in Jan 2020. The only new feature is an episode of vaginal  discharge. She had reported that she was tested negative for STDs last week. We will check urine that she has new urinary sxs that include Chlamydia test. Offered pelvic exam as additional work up and pt declines. Exam shows no peritonitis, doubt pt has PID or TOA. Will need to check to r/o ectopic pregnancy, however no vaginal bleeding. She has stable vital signs, not septic. I believe her primary issues is pain management. Pt notes she ran out of pain meds 2 days ago, also reports that per her pain management contract, she is not to receive a prescription for narcotics.     10:53 AM: Pt's exam is unchanged and without peritonitits. I asked if pt if there's anything else we can do for her and she notes she would like to be discharged after a dose of Keflex. I also d/c potential side effects of abx, including yeast infections and she can take Monocef if she experiences this and to f/u with GYN if she continues to have discharge and if there is concern for bacterial vaginosis.     Critical Care     N/A.     Diagnosis and Disposition   Rendering Provider: Alexander-Nicholas D. Annali Lybrand, MD    Clinical Impression(s):  1. Acute UTI    2. Suprapubic abdominal pain        Disposition  ED Disposition     ED Disposition Condition Date/Time Comment    Discharge  Sun Feb 27, 2019 10:52 AM Baldemar Friday discharge to home/self care.  Condition at disposition: Stable          Prescriptions    Discharge Medication List as of 02/27/2019 10:55 AM      START taking these medications    Details   cephalexin (KEFLEX) 500 MG capsule Take 1 capsule (500 mg total) by mouth 4 (four) times daily for 7 days, Starting Sun 02/27/2019, Until Sun 03/06/2019, Print                     Gilliam Hawkes, Alexander-Nicholas D, MD  02/27/19 1239

## 2019-02-27 NOTE — Discharge Instructions (Signed)
Dear Ms. Baldemar Friday:    I appreciate your choosing the [facility] for your healthcare needs, and hope your visit today was EXCELLENT.    Instructions:  Please follow-up with your GYN and pain management doctors.     Return to the Emergency Department for any worsening symptoms or concerns.    Below is some information that our patients often find helpful.    We wish you good health and please do not hesitate to contact us if we can ever be of any assistance.    Sincerely,  [place], Alexander-Nich*  [employer] [facility]    ________________________________________________________________    If you do not continue to improve or your condition worsens, please contact your doctor or return immediately to the Emergency Department.    Thank you for choosing [facility] for your emergency care needs.  We strive to provide EXCELLENT care to you and your family.      DOCTOR REFERRALS  Call [phone] if you need any further referrals and we can help you find a primary care doctor or specialist.  Also, available online at:  [email or web address]    YOUR CONTACT INFORMATION  Before leaving please check with registration to make sure we have an up-to-date contact number.  You can call registration at [phone] to update your information.  For questions about your hospital bill, please call [phone].  For questions about your Emergency Dept Physician bill please call [phone].      FREE HEALTH SERVICES  If you need help with health or social services, please call 2-1-1 for a free referral to resources in your area.  2-1-1 is a free service connecting people with information on health insurance, free clinics, pregnancy, mental health, dental care, food assistance, housing, and substance abuse counseling.  Also, available online at:  http://[email or web address].org    MEDICAL RECORDS AND TESTS  Certain laboratory test results do not come back  the same day, for example urine cultures.   We will contact you if other important findings are noted.  Radiology films are often reviewed again to ensure accuracy.  If there is any discrepancy, we will notify you.      Please call [phone] to pick up a complimentary CD of any radiology studies performed.  If you or your doctor would like to request a copy of your medical records, please call [phone].      ORTHOPEDIC INJURY   Please know that significant injuries can exist even when an initial x-ray is read as normal or negative.  This can occur because some fractures (broken bones) are not initially visible on x-rays.  For this reason, close outpatient follow-up with your primary care doctor or bone specialist (orthopedist) is required.    MEDICATIONS AND FOLLOWUP  Please be aware that some prescription medications can cause drowsiness.  Use caution when driving or operating machinery.    The examination and treatment you have received in our Emergency Department is provided on an emergency basis, and is not intended to be a substitute for your primary care physician.  It is important that your doctor checks you again and that you report any new or remaining problems at that time.      24 HOUR PHARMACIES  [facility] - [address] [place], [place] [address] (1.4 miles, 7 minutes)  [facility] - [address], [place], [place] [address] (6.5 miles, 13 minutes)  Handout with directions available on  request    PATIENT RELATIONS  If you have any concerns, issues, or feedback related to your care, positive or negative, please do not hesitate to contact Patient Relations at [phone]. They are open from 8:30AM-5:00PM Monday through Friday.

## 2019-02-27 NOTE — ED Notes (Signed)
MD at bedside. 

## 2019-02-27 NOTE — ED Triage Notes (Signed)
Low abd pain radiating to RLQ chronic but worse over last couple days.  +hematuria and dysuria.  Also had foul vag discharge this am.

## 2019-02-28 LAB — URINE CHLAMYDIA/NEISSERIA BY PCR
Chlamydia DNA by PCR: NEGATIVE
Neisseria gonorrhoeae by PCR: NEGATIVE

## 2019-05-30 ENCOUNTER — Emergency Department
Admission: EM | Admit: 2019-05-30 | Discharge: 2019-05-30 | Disposition: A | Discharge status: Home / Self Care | Payer: Self-pay | Attending: Student in an Organized Health Care Education/Training Program | Admitting: Student in an Organized Health Care Education/Training Program

## 2019-05-30 DIAGNOSIS — A0472 Enterocolitis due to Clostridium difficile, not specified as recurrent: Secondary | ICD-10-CM | POA: Insufficient documentation

## 2019-05-30 DIAGNOSIS — R109 Unspecified abdominal pain: Secondary | ICD-10-CM

## 2019-05-30 DIAGNOSIS — K5090 Crohn's disease, unspecified, without complications: Secondary | ICD-10-CM | POA: Insufficient documentation

## 2019-05-30 DIAGNOSIS — G8929 Other chronic pain: Secondary | ICD-10-CM

## 2019-05-30 DIAGNOSIS — E063 Autoimmune thyroiditis: Secondary | ICD-10-CM | POA: Insufficient documentation

## 2019-05-30 LAB — CBC AND DIFFERENTIAL
Absolute NRBC: 0 10*3/uL (ref 0.00–0.00)
Basophils Absolute Automated: 0.02 10*3/uL (ref 0.00–0.08)
Basophils Automated: 0.4 %
Eosinophils Absolute Automated: 0.14 10*3/uL (ref 0.00–0.44)
Eosinophils Automated: 2.7 %
Hematocrit: 33.2 % — ABNORMAL LOW (ref 34.7–43.7)
Hgb: 10.8 g/dL — ABNORMAL LOW (ref 11.4–14.8)
Immature Granulocytes Absolute: 0.02 10*3/uL (ref 0.00–0.07)
Immature Granulocytes: 0.4 %
Lymphocytes Absolute Automated: 2.22 10*3/uL (ref 0.42–3.22)
Lymphocytes Automated: 42.8 %
MCH: 29.8 pg (ref 25.1–33.5)
MCHC: 32.5 g/dL (ref 31.5–35.8)
MCV: 91.5 fL (ref 78.0–96.0)
MPV: 9.6 fL (ref 8.9–12.5)
Monocytes Absolute Automated: 0.63 10*3/uL (ref 0.21–0.85)
Monocytes: 12.1 %
Neutrophils Absolute: 2.16 10*3/uL (ref 1.10–6.33)
Neutrophils: 41.6 %
Nucleated RBC: 0 /100 WBC (ref 0.0–0.0)
Platelets: 302 10*3/uL (ref 142–346)
RBC: 3.63 10*6/uL — ABNORMAL LOW (ref 3.90–5.10)
RDW: 14 % (ref 11–15)
WBC: 5.19 10*3/uL (ref 3.10–9.50)

## 2019-05-30 LAB — BASIC METABOLIC PANEL
Anion Gap: 6 (ref 5.0–15.0)
BUN: 5 mg/dL — ABNORMAL LOW (ref 7–19)
CO2: 25 mEq/L (ref 22–29)
Calcium: 9.1 mg/dL (ref 8.5–10.5)
Chloride: 108 mEq/L (ref 100–111)
Creatinine: 0.7 mg/dL (ref 0.6–1.0)
Glucose: 78 mg/dL (ref 70–100)
Potassium: 3.8 mEq/L (ref 3.5–5.1)
Sodium: 139 mEq/L (ref 136–145)

## 2019-05-30 LAB — GFR: EGFR: >60

## 2019-05-30 MED ORDER — ONDANSETRON HCL 4 MG/2ML IJ SOLN
4.00 mg | Freq: Once | INTRAMUSCULAR | Status: AC
Start: 2019-05-30 — End: 2019-05-30
  Administered 2019-05-30: 4 mg via INTRAVENOUS
  Filled 2019-05-30: qty 2

## 2019-05-30 MED ORDER — OXYCODONE HCL 10 MG PO TABS
10.00 mg | ORAL_TABLET | Freq: Once | ORAL | Status: AC
Start: 2019-05-30 — End: 2019-05-30
  Administered 2019-05-30: 10 mg via ORAL
  Filled 2019-05-30: qty 1

## 2019-05-30 MED ORDER — ONDANSETRON 4 MG PO TBDP
4.00 mg | ORAL_TABLET | Freq: Four times a day (QID) | ORAL | 0 refills | Status: AC | PRN
Start: 2019-05-30 — End: ?

## 2019-05-30 MED ORDER — SODIUM CHLORIDE 0.9 % IV BOLUS
1000.00 mL | Freq: Once | INTRAVENOUS | Status: AC
Start: 2019-05-30 — End: 2019-05-30
  Administered 2019-05-30: 1000 mL via INTRAVENOUS

## 2019-05-30 NOTE — ED Provider Notes (Signed)
None        History     Chief Complaint   Patient presents with   . Abdominal Pain   . Diarrhea     33 year old female with Crohn's disease presents with abdominal pain.  Patient just left [facility] where she was seen for similar symptoms (records are available and care everywhere) patient had a normal white blood cell count and had a CT that showed a few small bowel loops with mild inflammation similar to previous studies consistent with history of Crohn's disease.  She did have a positive C. difficile by PCR and was started on vancomycin p.o.    Patient has 2 other visits to [facility] within the last 6 days for abdominal pain related complaints.    The history is provided by the patient.   Abdominal Pain   Pain location:  Generalized  Pain quality: cramping and dull    Pain radiates to:  Does not radiate  Pain severity:  Moderate  Onset quality:  Gradual  Timing:  Constant  Progression:  Unchanged  Chronicity:  Chronic  Context comment:  Longstanding abdominal pain with multiple visits  Relieved by:  Nothing  Worsened by:  Nothing  Associated symptoms: diarrhea, nausea and vomiting    Associated symptoms: no chest pain, no dysuria, no fever and no shortness of breath    Diarrhea   Associated symptoms: abdominal pain and vomiting    Associated symptoms: no fever             Past Medical History:   Diagnosis Date   . Abortion    . Alcohol abuse    . Anxiety    . Asherman's syndrome    . Crohn's disease    . Depression    . H/O Hashimoto thyroiditis     hypothyroidism   . PCOS (polycystic ovarian syndrome)    . Tachycardia        Past Surgical History:   Procedure Laterality Date   . BREAST IMPLANT     . COLONOSCOPY     . D & C, SUCTION N/A 08/24/2014    Procedure: D & C, SUCTION;  Surgeon: Maryruth Hancock, MD;  [facility];  Service: Obstetrics;  Laterality: N/A;   . INDUCED ABORTION         History reviewed. No pertinent family history.    Social  Social History      Tobacco Use   . Smoking status: Former Smoker     Last attempt to quit: 12/30/2007     Years since quitting: 11.4   . Smokeless tobacco: Never Used   Substance Use Topics   . Alcohol use: Yes     Comment: socially   . Drug use: No       .     Allergies   Allergen Reactions   . Bentyl [Dicyclomine] Hives   . Bactrim [Sulfamethoxazole W/Trimethoprim (Co-Trimoxazole)] Rash   . Sulfa Antibiotics Rash       Home Medications     Med List Status:  In Progress Set By: Orlean Patten, RN at 05/30/2019  3:29 AM                ALPRAZolam (XANAX) 1 MG tablet     Take 1 mg by mouth nightly as needed     Buprenorphine HCl (BELBUCA) 600 MCG Film     Place inside cheek     FLUoxetine (PROZAC) 40 MG  capsule     Take 40 mg by mouth daily.     levothyroxine (SYNTHROID, LEVOTHROID) 88 MCG tablet     Take 88 mcg by mouth daily.     LORazepam (ATIVAN) 1 MG tablet     Take by mouth     oxyCODONE (ROXICODONE) 10 MG immediate release tablet     Take 20 mg by mouth every 6 (six) hours as needed     promethazine (PHENERGAN) 12.5 MG suppository     Place 1 suppository (12.5 mg total) rectally every 6 (six) hours as needed for Nausea (as needed for severe vomiting)     zolpidem (AMBIEN CR) 12.5 MG CR tablet     Take 12.5 mg by mouth nightly as needed for Sleep           Review of Systems   Constitutional: Negative.  Negative for fever.   HENT: Negative.    Eyes: Negative.    Respiratory: Negative.  Negative for shortness of breath.    Cardiovascular: Negative.  Negative for chest pain.   Gastrointestinal: Positive for abdominal pain, diarrhea, nausea and vomiting.   Endocrine: Negative.    Genitourinary: Negative.  Negative for dysuria.   Musculoskeletal: Negative.  Negative for back pain and neck pain.   Skin: Negative for rash.   Neurological: Negative.  Negative for dizziness.   Psychiatric/Behavioral: Negative.  Negative for suicidal ideas.   All other systems reviewed and are negative.      Physical Exam    BP: 126/84, Heart Rate: 65,  Temp: 97.9 F (36.6 C), Resp Rate: 16, SpO2: 100 %, Weight: 46.8 kg    Physical Exam  Vitals signs and nursing note reviewed.   Constitutional:       Appearance: She is well-developed.      Comments: Peers uncomfortable, in pain   HENT:      Head: Normocephalic.   Eyes:      Pupils: Pupils are equal, round, and reactive to light.   Neck:      Musculoskeletal: Normal range of motion and neck supple.   Cardiovascular:      Rate and Rhythm: Normal rate and regular rhythm.      Heart sounds: Normal heart sounds. No murmur. No friction rub.   Pulmonary:      Effort: Pulmonary effort is normal. No respiratory distress.      Breath sounds: Normal breath sounds. No wheezing or rales.   Abdominal:      General: There is no distension.      Palpations: Abdomen is soft.      Tenderness: There is generalized abdominal tenderness.   Musculoskeletal: Normal range of motion.   Skin:     General: Skin is warm and dry.      Findings: No rash.   Neurological:      Mental Status: She is alert and oriented to person, place, and time.   Psychiatric:         Behavior: Behavior normal.       The results of the diagnostic studies below were reviewed by the ED provider:    Labs  Results     Procedure Component Value Units Date/Time    CBC and differential [[id number]]  (Abnormal) Collected:  05/30/19 0451     Updated:  05/30/19 0511     WBC 5.19 x10 3/uL      Hgb 10.8 g/dL      Hematocrit 19.1 %      Platelets 302  x10 3/uL      RBC 3.63 x10 6/uL      MCV 91.5 fL      MCH 29.8 pg      MCHC 32.5 g/dL      RDW 14 %      MPV 9.6 fL      Neutrophils 41.6 %      Lymphocytes Automated 42.8 %      Monocytes 12.1 %      Eosinophils Automated 2.7 %      Basophils Automated 0.4 %      Immature Granulocyte 0.4 %      Nucleated RBC 0.0 /100 WBC      Neutrophils Absolute 2.16 x10 3/uL      Abs Lymph Automated 2.22 x10 3/uL      Abs Mono Automated 0.63 x10 3/uL      Abs Eos Automated 0.14 x10 3/uL      Absolute Baso Automated 0.02 x10 3/uL      Absolute  Immature Granulocyte 0.02 x10 3/uL      Absolute NRBC 0.00 x10 3/uL     GFR [[id number]] Collected:  05/30/19 0424     Updated:  05/30/19 0501     EGFR >60.0    Basic Metabolic Panel [[id number]]  (Abnormal) Collected:  05/30/19 0424    Specimen:  Blood Updated:  05/30/19 0501     Glucose 78 mg/dL      BUN 5 mg/dL      Creatinine 0.7 mg/dL      Calcium 9.1 mg/dL      Sodium 595 mEq/L      Potassium 3.8 mEq/L      Chloride 108 mEq/L      CO2 25 mEq/L      Anion Gap 6.0          Radiologic Studies  Radiology Results (24 Hour)     ** No results found for the last 24 hours. **            MDM and ED Course     ED Medication Orders (From admission, onward)    Start Ordered     Status Ordering Provider    05/30/19 0431 05/30/19 0431  ondansetron (ZOFRAN) injection 4 mg  Once     Route: Intravenous  Ordered Dose: 4 mg     Last MAR action:  Given Yuvan Medinger M    05/30/19 0348 05/30/19 0347  sodium chloride 0.9 % bolus 1,000 mL  Once     Route: Intravenous  Ordered Dose: 1,000 mL     Last MAR action:  New Bag Claire Dolores M    05/30/19 0348 05/30/19 0347  oxyCODONE (ROXICODONE) immediate release tablet 10 mg  Once     Route: Oral  Ordered Dose: 10 mg     Last MAR action:  Given Charrise Lardner M             MDM  Number of Diagnoses or Management Options  Chronic abdominal pain:   Clostridioides difficile diarrhea:   Diagnosis management comments: 33 year old female with history of chronic abdominal pain, C. difficile colitis, Crohn's disease, Asherman syndrome, just left [facility] prior to arrival here, patient is stating that she will likely need admission however I discussed with her the fact that she has normal work-up, I believe her symptoms are more related to chronic abdominal pain and narcotic dependence that new acute process.  I will hydrate and recheck basic labs, I discussed  with her the fact that no IV  narcotics will be given    Patient with baseline blood work, sleeping on recheck, recommended  close follow-up with GI as well as her pain management physicians       Amount and/or Complexity of Data Reviewed  Clinical lab tests: ordered and reviewed    Patient Progress  Patient progress: stable                   Procedures    Clinical Impression & Disposition     Clinical Impression  Final diagnoses:   Clostridioides difficile diarrhea   Chronic abdominal pain        ED Disposition     ED Disposition Condition Date/Time Comment    Discharge  Mon May 30, 2019  5:51 AM [facility] Friday discharge to home/self care.    Condition at disposition: Stable           New Prescriptions    ONDANSETRON (ZOFRAN-ODT) 4 MG DISINTEGRATING TABLET    Take 1 tablet (4 mg total) by mouth every 6 (six) hours as needed for Nausea       This note was generated by the Epic EMR system/ Dragon speech recognition and may contain inherent errors or omissions not intended by the user. Grammatical errors, random word insertions, deletions, pronoun errors and incomplete sentences are occasional consequences of this technology due to software limitations. Not all errors are caught or corrected. If there are questions or concerns about the content of this note or information contained within the body of this dictation they should be addressed directly with the author for clarification           Hewitt Blade, [place]  05/30/19 0601       Rosamaria Lints, MD  05/30/19 [phone]

## 2019-05-30 NOTE — Discharge Instructions (Signed)
Chronic Pain Exacerbation    You have been seen for your chronic pain problem.    The Emergency Department/Urgent Care is available for emergencies related to your painful problem. However, you need to see your regular doctor for ongoing care for your pain.    ALL pain medicine refills MUST be done by your primary care doctor or the pain specialist who takes care of your pain.    The Emergency Department/Urgent Care is not the right place for the ongoing care of chronic pain.    If you have not seen a pain specialist, ask the doctor for information. He or she can give you information on who to contact and help refer you to one.    If you feel you are not able to get proper pain treatment, contact your health plan for advice.    YOU SHOULD SEEK MEDICAL ATTENTION IMMEDIATELY, EITHER HERE OR AT THE NEAREST EMERGENCY DEPARTMENT, IF ANY OF THE FOLLOWING OCCURS:   Any major change in your pain pattern.   Symptoms become worse.   You have any other concerns.    If you can't follow up with your doctor, or if at any time you feel you need to be rechecked or seen again, come back here or go to the nearest emergency department.               Pain, General    You have been seen today for treatment of your pain.    Your doctor is not sure what is causing your pain but does not feel that your condition is dangerous.    YOU SHOULD SEEK MEDICAL ATTENTION IMMEDIATELY, EITHER HERE OR AT THE NEAREST EMERGENCY DEPARTMENT, IF ANY OF THE FOLLOWING OCCURS:   Your pain becomes worse, even after taking pain medications.   You develop any other significant symptoms.               C. diff Enteritis    You were diagnosed with Clostridium difficile enteritis.    Clostridium difficile (C. diff) is a type of bacteria. In everyone's intestines, there are "good" bacteria and "bad" bacteria. The good bacteria help digestion. It also controls the growth of "bad" bacteria. C. diff can overgrow when the good bacteria fails to keep it  under control. This causes diarrhea (enteritis). One of the most common reasons for C. diff diarrhea is from antibiotic use. The antibiotics kill off good, normal bacteria living in the intestine. C. diff bacteria, which are not killed by most antibiotics, grow out of control. C. diff diarrhea start a few weeks after the antibiotics are taken. C. diff infection symptoms can be mild or severe. There may be mild and bloody diarrhea. There can also be a life-threatening colon infection with huge amounts of diarrhea. Fever (temperature higher than 100.15F / 38C) and abdominal (belly) pain are other symptoms. You may feel weak and nauseated (sick to the stomach).    This infection happens most often in older, hospitalized patients. These patients tend to be not very healthy. There is a bad, new strain of the bacteria (C. diff). Recently, Young, healthy people who have not been in the hospital lately have been getting it. An increase of this C. diff types has also been linked to some antacids. This happens mostly in older people. These antacids include: ranitidine (Zantac), famotidine (Pepcid), nizatidine (Axid), cimetidine (Tagamet), omeprazole (Prilosec), lansoprazole (Prevacid), esomeprazole (Nexium), pantoprazole (Protonix) and rabeprazole (Aciphex). It also includes all other antacids that are H2 blockers  and proton pump inhibitors.    The diagnosis is based on symptoms and testing your stools. The recent use of antibiotics can be another clue. Treatment is usually with one of two special antibiotics. These are metronidazole (Flagyl) or vancomycin (Vancocin). Drink plenty of fluids. This will keep you from being dehydrated. Also finish all antibiotics. If you are taking metronidazole (Flagyl), ANY alcohol will make you violently ill. Stay away from ALL alcohol for 2 days before and after taking this medicine. Please make sure other medicines you are taking are alcohol-free. This could include cough  medicine, for example.    Follow up with your primary care doctor in 2-3 days. This is for a recheck. It is also to make certain your symptoms are getting better.    YOU SHOULD SEEK MEDICAL ATTENTION IMMEDIATELY, EITHER HERE OR AT THE NEAREST EMERGENCY DEPARTMENT, IF ANY OF THE FOLLOWING OCCURS:   You have more abdominal (belly) pain.   Your symptoms get worse at any time.   You can't keep down liquids.   Fever (temperature higher than 100.54F / 38C) that doesn't go away or get worse after 2-3 days of antibiotics.   You feel lightheaded or weak.   You have worsening bloody diarrhea.   You feel sicker at any time or not getting better as expected.

## 2019-05-30 NOTE — EDIE (Signed)
COLLECTIVE?NOTIFICATION?05/30/2019 03:09?[name], [name]?MRN: [id number]    Criteria Met      5 ED Visits in 12 Months    3 Different Facilities in 90 Days    Narx Scores Alert    Security and Safety  No recent Security Events currently on file    ED Care Guidelines  There are currently no ED Care Guidelines for this patient. Please check your facility's medical records system.        Prescription Monitoring Program  650??- Narcotic Use Score  572??- Sedative Use Score  000??- Stimulant Use Score  740??- Overdose Risk Score  - All Scores range from 000-999 with 75% of the population scoring < 200 and on 1% scoring above 650  - The last digit of the narcotic, sedative, and stimulant score indicates the number of active prescriptions of that type  - Higher Use scores correlate with increased prescribers, pharmacies, mg equiv, and overlapping prescriptions  - Higher Overdose Risk Scores correlate with increased risk of unintentional overdose death   Concerning or unexpectedly high scores should prompt a review of the PMP record; this does not constitute checking PMP for prescribing purposes.      E.D. Visit Count (12 mo.)  Facility Visits   [facility] 7   [facility] 1   [facility] 4   [facility] 24   [facility] 3   [facility] [place] [place] 1   [facility] 5   [facility] 1   [facility] 2   Total 48   Note: Visits indicate total known visits.      Recent Emergency Department Visit Summary  Showing 10 most recent visits out of 48 in the past 12 months  Date Facility [facility] Type Diagnoses or Chief Complaint   May 30, 2019 [facility] [place] [place] [facility]      c diff      May 29, 2019 [facility] [place] [place] [facility]      abd pain      Abdominal Pain      Generalized abdominal pain      Enterocolitis due to Clostridium difficile, not specified  as      May 29, 2019 [facility] [place] [facility]     May 28, 2019 [facility] [place] [place] [facility]     May 26, 2019 [facility] [place] [place] [facility]      Abdominal Pain, Syncope      Abdominal Pain      Syncope      Generalized abdominal pain      May 23, 2019 [facility] [place] [place] [facility]      Flank pain      Nausea      Generalized abdominal pain      May 22, 2019 [facility] [place] [place] [facility]      1. Pelvic and perineal pain      1. Urinary tract infection, site not specified      2. Endometriosis, unspecified      3. Autoimmune thyroiditis      4. Intrauterine synechiae      5. Crohn's disease, unspecified, without complications      6. Allergy status to sulfonamides status      7. Allergy status to narcotic agent status  8. Encounter for pregnancy test, result negative      Apr 16, 2019 [facility] [place] [facility]      Constipation, unspecified      Personal history of nicotine dependence      Other chronic pain      Crohn's disease, unspecified, without complications      Disorder of thyroid, unspecified      Hormone replacement therapy      Mar 13, 2019 [facility] [place] [place] [facility]     Mar 10, 2019 [facility] [place] [place] [facility]      1. Lower abdominal pain, unspecified      1. Noninfective gastroenteritis and colitis, unspecified      2. Generalized abdominal tenderness      3. Nausea with vomiting, unspecified      4. Encounter for pregnancy test, result negative      5. Crohn's disease of small intestine without complications      6. Intrauterine synechiae      7. Autoimmune thyroiditis      8. Polycystic ovarian syndrome      9. Breast implant status          Recent Inpatient Visit Summary  Date Facility [facility] Type Diagnoses or Chief Complaint   Sep 06, 2018 [facility] [place] [place] [facility]  Chief Complaint: PYELO IN SETTING OF [place]    Jun 23, 2018 [facility] [place] [facility]  Chief Complaint: C DIFF INFECTION/DIARRHE        Care Team  There is not a care team on record at this time.   Collective Portal  This patient has registered at the [facility] Emergency Department   For more information visit: [email or web address]     PLEASE NOTE:     1.   Any care recommendations and other clinical information are provided as guidelines or for historical purposes only, and providers should exercise their own clinical judgment when providing care.    2.   You may only use this information for purposes of treatment, payment or health care operations activities, and subject to the limitations of applicable Collective Policies.    3.   You should consult directly with the organization that provided a care guideline or other clinical history with any questions about additional information or accuracy or completeness of information provided.    ? 2020 [place], [employer] - [email or web address]

## 2019-05-30 NOTE — ED Triage Notes (Signed)
Pt reports abdominal pain and diarrhea x 5 days. Pt reports +for c-diff per [facility] today. +n/v. -fevers

## 2019-08-08 ENCOUNTER — Emergency Department
Admission: EM | Admit: 2019-08-08 | Discharge: 2019-08-08 | Disposition: A | Discharge status: Home / Self Care | Payer: 59 | Attending: Emergency Medicine | Admitting: Emergency Medicine

## 2019-08-08 ENCOUNTER — Emergency Department: Payer: 59

## 2019-08-08 DIAGNOSIS — K5090 Crohn's disease, unspecified, without complications: Secondary | ICD-10-CM | POA: Insufficient documentation

## 2019-08-08 DIAGNOSIS — N3000 Acute cystitis without hematuria: Secondary | ICD-10-CM | POA: Insufficient documentation

## 2019-08-08 DIAGNOSIS — G894 Chronic pain syndrome: Secondary | ICD-10-CM | POA: Insufficient documentation

## 2019-08-08 DIAGNOSIS — E282 Polycystic ovarian syndrome: Secondary | ICD-10-CM | POA: Insufficient documentation

## 2019-08-08 DIAGNOSIS — E063 Autoimmune thyroiditis: Secondary | ICD-10-CM | POA: Insufficient documentation

## 2019-08-08 DIAGNOSIS — N856 Intrauterine synechiae: Secondary | ICD-10-CM | POA: Insufficient documentation

## 2019-08-08 LAB — HEPATIC FUNCTION PANEL
ALT: 10 U/L (ref 0–55)
AST (SGOT): 15 U/L (ref 5–34)
Albumin/Globulin Ratio: 1.1 (ref 0.9–2.2)
Albumin: 4.4 g/dL (ref 3.5–5.0)
Alkaline Phosphatase: 84 U/L (ref 37–106)
Bilirubin Direct: 0.3 mg/dL (ref 0.0–0.5)
Bilirubin Indirect: 0.2 mg/dL (ref 0.2–1.0)
Bilirubin, Total: 0.5 mg/dL (ref 0.2–1.2)
Globulin: 4 g/dL — ABNORMAL HIGH (ref 2.0–3.6)
Protein, Total: 8.4 g/dL — ABNORMAL HIGH (ref 6.0–8.3)

## 2019-08-08 LAB — CBC AND DIFFERENTIAL
Absolute NRBC: 0 10*3/uL (ref 0.00–0.00)
Basophils Absolute Automated: 0.03 10*3/uL (ref 0.00–0.08)
Basophils Automated: 0.4 %
Eosinophils Absolute Automated: 0.01 10*3/uL (ref 0.00–0.44)
Eosinophils Automated: 0.1 %
Hematocrit: 42.2 % (ref 34.7–43.7)
Hgb: 13.6 g/dL (ref 11.4–14.8)
Immature Granulocytes Absolute: 0.03 10*3/uL (ref 0.00–0.07)
Immature Granulocytes: 0.4 %
Lymphocytes Absolute Automated: 2.02 10*3/uL (ref 0.42–3.22)
Lymphocytes Automated: 24 %
MCH: 28.6 pg (ref 25.1–33.5)
MCHC: 32.2 g/dL (ref 31.5–35.8)
MCV: 88.8 fL (ref 78.0–96.0)
MPV: 9 fL (ref 8.9–12.5)
Monocytes Absolute Automated: 0.63 10*3/uL (ref 0.21–0.85)
Monocytes: 7.5 %
Neutrophils Absolute: 5.68 10*3/uL (ref 1.10–6.33)
Neutrophils: 67.6 %
Nucleated RBC: 0 /100 WBC (ref 0.0–0.0)
Platelets: 497 10*3/uL — ABNORMAL HIGH (ref 142–346)
RBC: 4.75 10*6/uL (ref 3.90–5.10)
RDW: 13 % (ref 11–15)
WBC: 8.4 10*3/uL (ref 3.10–9.50)

## 2019-08-08 LAB — BASIC METABOLIC PANEL
Anion Gap: 10 (ref 5.0–15.0)
BUN: 10 mg/dL (ref 7–19)
CO2: 24 mEq/L (ref 22–29)
Calcium: 9.3 mg/dL (ref 8.5–10.5)
Chloride: 103 mEq/L (ref 100–111)
Creatinine: 0.7 mg/dL (ref 0.6–1.0)
Glucose: 90 mg/dL (ref 70–100)
Potassium: 4.1 mEq/L (ref 3.5–5.1)
Sodium: 137 mEq/L (ref 136–145)

## 2019-08-08 LAB — URINALYSIS, REFLEX TO MICROSCOPIC EXAM IF INDICATED
Bilirubin, UA: NEGATIVE
Blood, UA: NEGATIVE
Glucose, UA: NEGATIVE
Ketones UA: NEGATIVE
Nitrite, UA: POSITIVE — AB
Protein, UR: 30 — AB
Specific Gravity UA: 1.02 (ref 1.001–1.035)
Urine pH: 8 (ref 5.0–8.0)
Urobilinogen, UA: NORMAL mg/dL (ref 0.2–2.0)

## 2019-08-08 LAB — LIPASE: Lipase: <4 U/L — ABNORMAL LOW (ref 8–78)

## 2019-08-08 LAB — HCG, SERUM, QUALITATIVE: Hcg Qualitative: NEGATIVE

## 2019-08-08 LAB — GFR: EGFR: >60

## 2019-08-08 MED ORDER — OXYCODONE HCL 10 MG PO TABS
10.0000 mg | ORAL_TABLET | Freq: Four times a day (QID) | ORAL | Status: DC | PRN
Start: 2019-08-08 — End: 2019-08-08

## 2019-08-08 MED ORDER — OXYCODONE HCL 5 MG PO TABS
10.00 mg | ORAL_TABLET | Freq: Once | ORAL | Status: AC
Start: 2019-08-08 — End: 2019-08-08
  Administered 2019-08-08: 10 mg via ORAL
  Filled 2019-08-08: qty 2

## 2019-08-08 MED ORDER — SODIUM CHLORIDE 0.9 % IV BOLUS
1000.00 mL | Freq: Once | INTRAVENOUS | Status: DC
Start: 2019-08-08 — End: 2019-08-08

## 2019-08-08 MED ORDER — MORPHINE SULFATE 4 MG/ML IJ/IV SOLN (WRAP)
4.0000 mg | Freq: Once | Status: DC
Start: 2019-08-08 — End: 2019-08-08
  Filled 2019-08-08: qty 1

## 2019-08-08 MED ORDER — CEPHALEXIN 500 MG PO CAPS
500.00 mg | ORAL_CAPSULE | Freq: Two times a day (BID) | ORAL | 0 refills | Status: AC
Start: 2019-08-08 — End: 2019-08-15

## 2019-08-08 MED ORDER — ONDANSETRON HCL 4 MG/2ML IJ SOLN
4.00 mg | Freq: Once | INTRAMUSCULAR | Status: DC
Start: 2019-08-08 — End: 2019-08-08
  Filled 2019-08-08: qty 2

## 2019-08-08 MED ORDER — ONDANSETRON 4 MG PO TBDP
4.00 mg | ORAL_TABLET | Freq: Once | ORAL | Status: AC
Start: 2019-08-08 — End: 2019-08-08
  Administered 2019-08-08: 4 mg via ORAL
  Filled 2019-08-08: qty 1

## 2019-08-08 MED ORDER — KETOROLAC TROMETHAMINE 15 MG/ML IJ SOLN
15.00 mg | Freq: Once | INTRAMUSCULAR | Status: DC
Start: 2019-08-08 — End: 2019-08-08
  Filled 2019-08-08: qty 1

## 2019-08-08 NOTE — ED Notes (Signed)
RN alex and Glyn Ade unable to get IV access at this time. MD aware.

## 2019-08-08 NOTE — ED Triage Notes (Signed)
abd pain that radiates to the back that started last night associated with nausea. Denies fever cough and sob

## 2019-08-08 NOTE — EDIE (Signed)
COLLECTIVE?NOTIFICATION?08/08/2019 15:29?TONAE, LIVOLSI M?MRN: [id number]    Criteria Met      5 ED Visits in 12 Months    3 Different Facilities in 90 Days    Narx Scores Alert    Security and Safety  No recent Security Events currently on file    ED Care Guidelines  There are currently no ED Care Guidelines for this patient. Please check your facility's medical records system.        Prescription Monitoring Program  622??- Narcotic Use Score  542??- Sedative Use Score  000??- Stimulant Use Score  600??- Overdose Risk Score  - All Scores range from 000-999 with 75% of the population scoring < 200 and on 1% scoring above 650  - The last digit of the narcotic, sedative, and stimulant score indicates the number of active prescriptions of that type  - Higher Use scores correlate with increased prescribers, pharmacies, mg equiv, and overlapping prescriptions  - Higher Overdose Risk Scores correlate with increased risk of unintentional overdose death   Concerning or unexpectedly high scores should prompt a review of the PMP record; this does not constitute checking PMP for prescribing purposes.      E.D. Visit Count (12 mo.)  Facility Visits   [facility] 6   [facility] 1   [facility] 4   [facility] 27   [facility] 4   [facility] [place] 1   [facility] 1   [facility] 1   [facility] 3   Total 48   Note: Visits indicate total known visits.      Recent Emergency Department Visit Summary  Showing 10 most recent visits out of 48 in the past 12 months  Date Facility [facility] Type Diagnoses or Chief Complaint   Aug 08, 2019 [facility] [place] [place] [facility]      Abdominal Pain      Jul 20, 2019 [facility] [place] [place] [facility]      1. Right lower quadrant pain      2. Nausea with vomiting, unspecified      2. Unspecified abdominal pain       3. Nausea      3. Diarrhea, unspecified      4. Dysuria      5. Contact with and (suspected) exposure to other viral communic      5. Frequency of micturition      6. Personal history of other diseases of the digestive system      6. Pelvic and perineal pain      Jul 19, 2019 [facility] [place] 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 [place] [place] [facility]      Contact with and (suspected) exposure to other viral communic      Fever, unspecified      Jul 16, 2019 [facility] [place]      Pelvic Pain, Vaginal Bleeding      Pelvic Pain      Pelvic and perineal pain      Urinary tract infection, site not specified      Jun 18, 2019 [facility] [place] [place] [facility]      Abdominal Pain      Nausea      Generalized abdominal pain      Jun 12, 2019 [facility] [place]  [place] [facility]      Flank Pain      Crohn's disease, unspecified, with unspecified complications      Generalized abdominal pain      Tubulo-interstitial nephritis, not specified as acute or chro      May 30, 2019 [facility] [place] [place] [facility]      abdominal pain/diarrhea      Diarrhea      Abdominal Pain      Enterocolitis due to Clostridium difficile, not specified as      Generalized abdominal pain      May 30, 2019 [place] [facility] [place] [place] [facility]      c diff      Abdominal Pain      Diarrhea      Enterocolitis due to Clostridium difficile, not specified as      Unspecified abdominal pain      Other chronic pain      May 29, 2019 [facility]  [place] [place] [facility]      abd pain      Abdominal Pain      Generalized abdominal pain      Enterocolitis due to Clostridium difficile, not specified as          Recent Inpatient Visit Summary  Date Facility [facility] Type Diagnoses or Chief Complaint   Sep 06, 2018  [facility] [place] [place] [facility]  Chief Complaint: PYELO IN SETTING OF CROHN        Care Team  There is not a care team on record at this time.   Collective Portal  This patient has registered at the [facility] Emergency Department   For more information visit: [email or web address]     PLEASE NOTE:     1.   Any care recommendations and other clinical information are provided as guidelines or for historical purposes only, and providers should exercise their own clinical judgment when providing care.    2.   You may only use this information for purposes of treatment, payment or health care operations activities, and subject to the limitations of applicable Collective Policies.    3.   You should consult directly with the organization that provided a care guideline or other clinical history with any questions about additional information or accuracy or completeness of information provided.    ? 2020 [place], [employer] - [email or web address]

## 2019-08-08 NOTE — ED Notes (Signed)
Spoke with Pharmacist on dose change due to medication not available in ordered dose.

## 2019-08-08 NOTE — ED Provider Notes (Signed)
[facility] EMERGENCY DEPARTMENT PATIENT ENCOUNTER      Patient Information   Patient Name: [name],[name]  Encounter Date:  08/08/2019  Patient DOB:  1986-12-20  MRN:  [id number]  Room:  26/B26  Rendering Provider: Suzi Roots. Renny Remer, PA-C  History of Presenting Illness   Chief Complaint: lower abd pain    HPI Comments:   33 y.o. 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  Patient states that symptoms started yesterday.  5 days ago, she had HSG for Asherman syndrome.  Says that she initially had no pain consider developing lower abdominal cramping yesterday that "feels like contractions".  Says that she has oxycodone at home that she takes for breakthrough pain and has been taking it without any relief.  No fevers.  She is nauseated but no vomiting or diarrhea.  She also recently had pyelonephritis and was placed on antibiotics which she took and finished about a week ago.  Says she is still having left flank pain but previously it was on the right.  No urinary symptoms.    OB gyn in [place]  GI doctor- "offices all over in [place], [place]"  PMD: Brita Romp, MD  Past Medical History     Past Medical History:   Diagnosis Date   . Abortion    . Alcohol abuse    . Anxiety    . Asherman's syndrome    . Crohn's disease    . Depression    . H/O Hashimoto thyroiditis     hypothyroidism   . PCOS (polycystic ovarian syndrome)    . Tachycardia      Past Surgical History     Past Surgical History:   Procedure Laterality Date   . BREAST IMPLANT     . COLONOSCOPY     . D & C, SUCTION N/A 08/24/2014    Procedure: D & C, SUCTION;  Surgeon: Maryruth Hancock, MD;  [facility];  Service: Obstetrics;  Laterality: N/A;   . INDUCED ABORTION       Family History   History reviewed. No pertinent family history.  Social History   Patient is a non-smoker  Allergies     Allergies   Allergen Reactions   .  Bentyl [Dicyclomine] Hives   . Bactrim [Sulfamethoxazole W/Trimethoprim (Co-Trimoxazole)] Rash   . Sulfa Antibiotics Rash     Home Medications     Prior to Admission medications    Medication Sig Start Date End Date Taking? Authorizing Provider   ALPRAZolam (XANAX) 1 MG tablet Take 1 mg by mouth nightly as needed    [provider]   Buprenorphine HCl (BELBUCA) 600 MCG Film Place inside cheek    [provider]   FLUoxetine (PROZAC) 40 MG capsule Take 40 mg by mouth daily. 04/08/17   [provider]   levothyroxine (SYNTHROID, LEVOTHROID) 88 MCG tablet Take 88 mcg by mouth daily.    [provider]   LORazepam (ATIVAN) 1 MG tablet Take by mouth    [provider]   ondansetron (ZOFRAN-ODT) 4 MG disintegrating tablet Take 1 tablet (4 mg total) by mouth every 6 (six) hours as needed for Nausea 05/30/19   Hewitt Blade, PA   oxyCODONE (ROXICODONE) 10 MG immediate release tablet Take 20 mg by mouth every 6 (six) hours as needed    [provider]   promethazine (  PHENERGAN) 12.5 MG suppository Place 1 suppository (12.5 mg total) rectally every 6 (six) hours as needed for Nausea (as needed for severe vomiting) 02/10/19   Diegelmann, Lyman Speller, MD   zolpidem (AMBIEN CR) 12.5 MG CR tablet Take 12.5 mg by mouth nightly as needed for Sleep    [provider]     Review of Systems   Constitutional: Negative for fever or chills.   HEENT: Negative for sore throat, runny nose  Cardiovascular: Negative for chest pain  Respiratory: Negative for cough and shortness of breath.   Gastrointestinal: Positive for abd. pain, nausea, negative for vomiting, diarrhea.  Genitourinary: Negative for dysuria, change in frequency.   Musculoskeletal: Negative for back pain   Skin: Negative for rash.   Neuro: Negative for dizziness, headache   Physical Exam   PHYSICAL EXAM  Patient Vitals for the past 24 hrs:   BP Temp Temp src Pulse Resp SpO2 Weight   08/08/19 1930 124/63 -- -- 89 -- 96 %  --   08/08/19 1803 116/69 -- -- 78 -- 98 % --   08/08/19 1639 115/78 -- -- 88 -- 98 % --   08/08/19 1535 138/90 98.8 F (37.1 C) Oral (!) 102 18 100 % --   08/08/19 1535 -- -- -- -- -- -- (!) 42.9 kg     Constitutional: Pt is oriented to person, place, and time. Pt appears well-developed and well-nourished. No distress bu holding the lower abdomen, looks uncomfortable  HENT:     Head: Normocephalic and atraumatic.     Right/Left Ear: External ear normal.     Mouth/Throat: Moist mucous membranes. Uvula is midline. Oropharynx is clear.  Eyes: Conjunctivae normal. Pupils are equal, round, and reactive to light.   Neck: Normal range of motion. Supple.  Cardiovascular: Regular rate and rhythm. No murmur.    Pulmonary/Chest: Vesicular breath sounds throughout. No respiratory distress. O2 sat 100% on RA. No wheezes, rales or crackles.  Abdominal: Bowel sounds are present throughout. Abdomen is tender in the suprapubic and LLQ without guarding or rebound. Slight L sided CVAT, no R sided tenderness  Neurological: Pt is alert and oriented to person, place, and time. GCS 15. Normal speech.  Skin: Skin is warm and dry. No rashes  Psychiatric: Normal mood and affect. Behavior is normal.     Orders Placed During This Encounter     Orders Placed This Encounter   Procedures   . Urine culture   . US Pelvic with Transvaginal (r/o torsion)   . Basic Metabolic Panel   . CBC and differential   . Lipase   . Hepatic function panel (LFT)   . UA, Reflex to Microscopic (pts  3 + yrs)   . Beta HCG, Qual, Serum   . GFR   . Diet NPO effective now   . Saline lock IV     ED Medications Administered     ED Medication Orders (From admission, onward)    Start Ordered     Status Ordering Provider    08/08/19 1717 08/08/19 1716  oxyCODONE (ROXICODONE) immediate release tablet 10 mg  Once in ED     Route: Oral  Ordered Dose: 10 mg     Last MAR action:  Given KRAPE, KYLI N    08/08/19 1645 08/08/19 1644  ondansetron (ZOFRAN-ODT) disintegrating tablet 4  mg  Once     Route: Oral  Ordered Dose: 4 mg     Last MAR action:  Given Neelam Tiggs  C    08/08/19 1642 08/08/19 1644  oxyCODONE (ROXICODONE) immediate release tablet 10 mg  Every 6 hours PRN     Route: Oral  Ordered Dose: 10 mg     Acknowledged Zaida Reiland C    08/08/19 1609 08/08/19 1608  ketorolac (TORADOL) injection 15 mg  Once     Route: Intravenous  Ordered Dose: 15 mg     Last MAR action:  Not Given Nissim Fleischer C    08/08/19 1609 08/08/19 1608  morphine injection 4 mg  Once     Route: Intravenous  Ordered Dose: 4 mg     Last MAR action:  Not Given Derisha Funderburke C    08/08/19 1609 08/08/19 1608  ondansetron (ZOFRAN) injection 4 mg  Once     Route: Intravenous  Ordered Dose: 4 mg     Last MAR action:  Not Given Jeanne Ivan    08/08/19 1539 08/08/19 1538  sodium chloride 0.9 % bolus 1,000 mL  Once     Route: Intravenous  Ordered Dose: 1,000 mL     Last MAR action:  Not Given KRAPE, KYLI N        Diagnostic Study Results and Data Review   The results of the diagnostic studies below were reviewed by the ED provider:  Labs  Results     Procedure Component Value Units Date/Time    Urine culture [[id number]] Collected:  08/08/19 1801    Specimen:  Urine, Catheterized, Foley Updated:  08/08/19 1837    Narrative:       Replace urinary catheter prior to obtaining the urine culture  if it has been in place for greater than or equal to 14  days:->N/A No Foley  Indications for Urine Culture:->Suprapubic Pain/Tenderness or  Dysuria    UA, Reflex to Microscopic (pts  3 + yrs) [[id number]]  (Abnormal) Collected:  08/08/19 1801    Specimen:  Urine Updated:  08/08/19 1819     Urine Type Clean Catch     Color, UA Yellow     Clarity, UA Cloudy     Specific Gravity UA 1.020     Urine pH 8.0     Leukocyte Esterase, UA Small     Nitrite, UA Positive     Protein, UR 30     Glucose, UA Negative     Ketones UA Negative     Urobilinogen, UA Normal mg/dL      Bilirubin, UA Negative     Blood, UA Negative     RBC, UA 6 - 10 /hpf       WBC, UA 11 - 25 /hpf      Squamous Epithelial Cells, Urine 6 - 10 /hpf      Urine Mucus Present    Hepatic function panel (LFT) [[id number]]  (Abnormal) Collected:  08/08/19 1630    Specimen:  Blood Updated:  08/08/19 1655     Bilirubin, Total 0.5 mg/dL      Bilirubin Direct 0.3 mg/dL      Bilirubin Indirect 0.2 mg/dL      AST (SGOT) 15 U/L      ALT 10 U/L      Alkaline Phosphatase 84 U/L      Protein, Total 8.4 g/dL      Albumin 4.4 g/dL      Globulin 4.0 g/dL      Albumin/Globulin Ratio 1.1    GFR [[id number]] Collected:  08/08/19 1630     Updated:  08/08/19 1655  EGFR >60.0    Basic Metabolic Panel [[id number]] Collected:  08/08/19 1630    Specimen:  Blood Updated:  08/08/19 1655     Glucose 90 mg/dL      BUN 10 mg/dL      Creatinine 0.7 mg/dL      Calcium 9.3 mg/dL      Sodium 829 mEq/L      Potassium 4.1 mEq/L      Chloride 103 mEq/L      CO2 24 mEq/L      Anion Gap 10.0    Lipase [[id number]]  (Abnormal) Collected:  08/08/19 1630    Specimen:  Blood Updated:  08/08/19 1655     Lipase <4 U/L     Beta HCG, Qual, Serum [[id number]] Collected:  08/08/19 1630    Specimen:  Blood Updated:  08/08/19 1646     Hcg Qualitative Negative    CBC and differential [[id number]]  (Abnormal) Collected:  08/08/19 1630    Specimen:  Blood Updated:  08/08/19 1637     WBC 8.40 x10 3/uL      Hgb 13.6 g/dL      Hematocrit 10.2 %      Platelets 497 x10 3/uL      RBC 4.75 x10 6/uL      MCV 88.8 fL      MCH 28.6 pg      MCHC 32.2 g/dL      RDW 13 %      MPV 9.0 fL      Neutrophils 67.6 %      Lymphocytes Automated 24.0 %      Monocytes 7.5 %      Eosinophils Automated 0.1 %      Basophils Automated 0.4 %      Immature Granulocytes 0.4 %      Nucleated RBC 0.0 /100 WBC      Neutrophils Absolute 5.68 x10 3/uL      Lymphocytes Absolute Automated 2.02 x10 3/uL      Monocytes Absolute Automated 0.63 x10 3/uL      Eosinophils Absolute Automated 0.01 x10 3/uL      Basophils Absolute Automated 0.03 x10 3/uL      Immature Granulocytes Absolute  0.03 x10 3/uL      Absolute NRBC 0.00 x10 3/uL           Radiologic Studies  Radiology Results (24 Hour)     Procedure Component Value Units Date/Time    US Pelvic with Transvaginal (r/o torsion) [[id number]] Collected:  08/08/19 1748    Order Status:  Completed Updated:  08/08/19 1752    Narrative:       HISTORY: Lower abdominal pain following hysterosalpingogram    Transabdominal ultrasound of the pelvis show an anteverted uterus  measuring 6.1 x 3.6 x 3.8 cm.  No free fluid is seen within the pelvis.    Endovaginal ultrasound was performed for finer delineation of pelvic  anatomy.  The endometrial stripe is not thickened measuring 3 mm.  Both  ovaries are seen and appear unremarkable.  The right ovary measures 3.1  x 1.8 x 1.9 cm and the left ovary measures 2.1 x 1.0 x 1.9 cm. No  adnexal mass was seen.    Color flow and spectral Doppler examination of both ovaries show  arterial and venous flow.      Impression:           No acute abnormality or ovarian torsion on pelvic ultrasound.    J.  Carole Binning, MD   08/08/2019 5:49 PM          Abnormal results/incidental findings discussed with pt and/or family: Yes  Monitors, EKG, Procedures, Critical Care, and Splints   None  MDM, Consults and Clinical Notes   Working Differential (not completely inclusive): Chronic pain, Asherman's syndrome, adhesions, Crohn's disease, ectopic, uterine perforation    Nursing records reviewed: Yes    Clinical Notes/MDM: 33 y.o. female to the ED with lower abd pain after having HSG 5 days ago for scar tissue for Asherman's syndrome. She initially had no pain then has developed lower abd pain that feels "like contractions". Her exam is fairly benign and she has been evaluated many times at various locations for the same symptoms. Based on her exam today, I have low concern for uterine perf although it is on the differential given her recent procedure.  Does not seem consistent with her Crohn's disease.  She recently finished course of  antibiotics for pyelonephritis. Requesting pain and nausea meds      PPE option 2 was utilized.     PPE options:  1. surgical mask, gloves  2. surgical mask, gloves, face shield/goggles  3. surgical mask, face shield/goggles, gown, gloves  4. N95 mask, face shield/goggles, gown, gloves  5. 76M PAPR, gown, gloves  6. not applicable   Re-Eval at the following times:  ED Course as of Aug 07 1956   Mon Aug 08, 2019   1640 Line blew  Will give PO pain meds  Hold on repeat access for now as I do not need IV access for imaging purposes and patient does not want to be stuck anymore   [AB]   1806 Sono normal  Urine again consistent with infection with leuks and + nitrates  No indication for admission and some flank tenderness consistent with pyelo  Will Railroad home with Keflex and give urogyn follow up for recurrent UTIs  Patient has Oxycodone at home for pain and is seeing her GI doctor tomorrow for follow up on her Crohn's    [AB]      ED Course User Index  [AB] Jeanne Ivan, PA       The patient is aware that this evaluation is only a screening for emergent conditions related to his or her symptoms and presentation. Discussed appropriate supportive care in addition to any prescriptions provided. Patient/Family were given opportunity to ask questions which were answered to the best of my ability. Patient/family were given strict return precautions and were agreeable to dispo plan.    COVID Notes  The patient was seen and evaluated during the time of COVID pandemic.  Significant limitations can be present in the evaluation and management of ED patients during pandemic conditions, including but not limited to lack of testing, rapidly changing IHS protocols, limited evaluation and follow-up resources.     Prescriptions     Discharge Medication List as of 08/08/2019  6:35 PM      START taking these medications    Details   cephalexin (KEFLEX) 500 MG capsule Take 1 capsule (500 mg total) by mouth 2 (two) times daily for 7 days,  Starting Mon 08/08/2019, Until Mon 08/15/2019, Normal           Diagnosis and Disposition   Clinical Impression:  1. Acute cystitis without hematuria      Final diagnoses:   Acute cystitis without hematuria       Disposition:  ED Disposition  ED Disposition Condition Date/Time Comment    Discharge  Mon Aug 08, 2019  6:35 PM Gauri Galvao discharge to home/self care.    Condition at disposition: Stable        Rendering Provider: Suzi Roots. Duglas Heier, PA-C  Attending's signature signifies review of the provider note and clinical impression.    This note was generated by the [facility] EMR system/Dragon speech recognition and may contain inherent errors or omissions not intended by the user. Grammatical errors, random word insertions, deletions, pronoun errors and incomplete sentences are occasional consequences of this technology due to software limitations. Not all errors are caught or corrected. If there are questions or concerns about the content of this note or information contained within the body of this dictation they should be addressed directly with the author for clarification.       Jeanne Ivan, [place]  08/08/19 1959       Wilmer Floor, DO  08/09/19 1200

## 2019-08-08 NOTE — Discharge Instructions (Signed)
Urinary Tract Infection, Lower    You have been diagnosed with a basic lower urinary tract infection (UTI). This means it does not involve your kidneys or areas other than your bladder.    A UTI is an infection in your bladder. Your doctor diagnosed it by testing your urine. UTIs usually causes burning when you urinate (pee) or urinating often. It might make you feel like you have to urinate even when you don't.     UTI is usually treated with antibiotics and medicine to help with pain.    It is very important that you fill your prescription and take all of the antibiotics as directed. If a lower urinary tract infection goes untreated for too long, it can become a kidney infection.    For Women: To reduce the risk of getting another UTI:   Always urinate before and after sexual intercourse.   Always wipe from front to back after urinating or having a bowel movement. Do not wipe from back to front.   Drink plenty of fluids. Try to drink cranberry or blueberry juice. These juices have a chemical that stops bacteria from sticking to the bladder.    Return here or go to the nearest Emergency Department immediately if:   You have a fever (temperature higher than 100.57F / 38C) or shaking chills.   You feel nauseated (sick to your stomach) or vomit (throw up).   You have pain in your side or back.   You don't get better after taking all of your antibiotics.   You have any new symptoms or concerns.   You feel worse or do not improve.    If you can't follow up with your doctor, or if at any time you feel you need to be rechecked or seen again, come back here or go to the nearest emergency department.                     Dear Baldemar Friday,    You were seen today by Dorian Pod, PA-C. Thank you for choosing the [facility] for your healthcare needs.  We hope your visit today was EXCELLENT.    Return to the emergency room immediately if you have any worsening symptoms!    Please take any medications prescribed as directed.     If you have any questions or concerns, I am available at [phone]. Please do not hesitate to contact me if I can be of assistance.     Below is some information and resources that our patients often find helpful.    Sincerely,  Dorian Pod, Physician Assistant  [facility]    ________________________________________________________________  Thank you for choosing [facility] for your emergency care needs.  We strive to provide EXCELLENT care to you and your family.      IF YOU DO NOT CONTINUE TO IMPROVE OR YOUR CONDITION WORSENS, PLEASE CONTACT YOUR DOCTOR OR RETURN IMMEDIATELY TO THE EMERGENCY DEPARTMENT.    DOCTOR REFERRALS  Call [phone] (available 24 hours a day, 7 days a week) if you need any further referrals and we can help you find a primary care doctor or specialist.  Also, available online at:  [email or web address]    YOUR CONTACT INFORMATION  Before leaving please check with registration to make sure we have an up-to-date contact number.  You can call registration at [phone] to update your information.  For questions about  your hospital bill, please call [phone].  For questions about your Emergency Dept Physician bill please call [phone].      FREE HEALTH SERVICES  If you need help with health or social services, please call 2-1-1 for a free referral to resources in your area.  2-1-1 is a free service connecting people with information on health insurance, free clinics, pregnancy, mental health, dental care, food assistance, housing, and substance abuse counseling.  Also, available online at:  Http://[email or web address].org      PATIENT RELATIONS  If you have any concerns, issues or feedback with your care, positive or negative, please do not hesitate to contact Patient Relations at [phone]. They are open from 8:30AM-5:00PM Monday through  Friday.      MEDICAL RECORDS AND TESTS  Certain laboratory test results do not come back the same day, for example urine cultures.  We will contact you if other important findings are noted. Radiology films are often reviewed again to ensure accuracy.  If there is any discrepancy, we will notify you.    Please call [phone] to pick up a complimentary CD of any radiology studies performed.  If you or your doctor would like to request a copy of your medical records, please call [phone].      ORTHOPEDIC INJURY   Please know that significant injuries can exist even when an initial x-ray is read as normal or negative.  This can occur because some fractures (broken bones) are not initially visible on x-rays.  For this reason, close outpatient follow-up with your primary care doctor or bone specialist (orthopedist) is required.    MEDICATIONS AND FOLLOWUP  Please be aware that some prescription medications can cause drowsiness.  Use caution when driving or operating machinery.    The examination and treatment you have received in our Emergency Department is provided on an emergency basis, and is not intended to be a substitute for your primary care physician.  It is important that your doctor checks you again and that you report any new or remaining problems at that time.      24 HOUR PHARMACIES  [id number]  [address]  [place], [place] [address]

## 2019-08-12 MED ORDER — LEVOFLOXACIN 500 MG PO TABS
500.00 mg | ORAL_TABLET | Freq: Every day | ORAL | 0 refills | Status: AC
Start: 2019-08-12 — End: 2019-08-19

## 2020-01-06 DIAGNOSIS — F1190 Opioid use, unspecified, uncomplicated: Secondary | ICD-10-CM | POA: Insufficient documentation

## 2020-01-06 DIAGNOSIS — E282 Polycystic ovarian syndrome: Secondary | ICD-10-CM | POA: Insufficient documentation

## 2020-03-01 DIAGNOSIS — K2950 Unspecified chronic gastritis without bleeding: Secondary | ICD-10-CM | POA: Insufficient documentation

## 2020-03-01 DIAGNOSIS — B9681 Helicobacter pylori [H. pylori] as the cause of diseases classified elsewhere: Secondary | ICD-10-CM | POA: Insufficient documentation

## 2020-03-14 DIAGNOSIS — E2740 Unspecified adrenocortical insufficiency: Secondary | ICD-10-CM | POA: Insufficient documentation

## 2020-04-08 DIAGNOSIS — Z0000 Encounter for general adult medical examination without abnormal findings: Secondary | ICD-10-CM | POA: Insufficient documentation

## 2020-04-08 DIAGNOSIS — F411 Generalized anxiety disorder: Secondary | ICD-10-CM | POA: Insufficient documentation

## 2020-05-31 DIAGNOSIS — R636 Underweight: Secondary | ICD-10-CM | POA: Insufficient documentation

## 2020-10-29 DIAGNOSIS — N3000 Acute cystitis without hematuria: Secondary | ICD-10-CM | POA: Insufficient documentation

## 2020-12-31 DIAGNOSIS — U071 COVID-19: Secondary | ICD-10-CM | POA: Insufficient documentation

## 2021-02-18 DIAGNOSIS — A048 Other specified bacterial intestinal infections: Secondary | ICD-10-CM | POA: Insufficient documentation

## 2021-02-18 DIAGNOSIS — N390 Urinary tract infection, site not specified: Secondary | ICD-10-CM | POA: Insufficient documentation

## 2021-03-15 DIAGNOSIS — R1310 Dysphagia, unspecified: Secondary | ICD-10-CM | POA: Insufficient documentation

## 2021-04-08 ENCOUNTER — Observation Stay (HOSPITAL_COMMUNITY): Payer: 59

## 2021-04-08 ENCOUNTER — Other Ambulatory Visit: Payer: Self-pay

## 2021-04-08 ENCOUNTER — Encounter (HOSPITAL_COMMUNITY): Payer: Self-pay

## 2021-04-08 ENCOUNTER — Inpatient Hospital Stay (HOSPITAL_COMMUNITY)
Admission: EM | Admit: 2021-04-08 | Discharge: 2021-04-11 | DRG: 372 | Disposition: A | Discharge status: Home / Self Care | Payer: 59 | Attending: Internal Medicine | Admitting: Internal Medicine

## 2021-04-08 DIAGNOSIS — Z20822 Contact with and (suspected) exposure to covid-19: Secondary | ICD-10-CM | POA: Diagnosis present

## 2021-04-08 DIAGNOSIS — A0472 Enterocolitis due to Clostridium difficile, not specified as recurrent: Secondary | ICD-10-CM | POA: Diagnosis present

## 2021-04-08 DIAGNOSIS — K50919 Crohn's disease, unspecified, with unspecified complications: Secondary | ICD-10-CM

## 2021-04-08 DIAGNOSIS — Z87892 Personal history of anaphylaxis: Secondary | ICD-10-CM

## 2021-04-08 DIAGNOSIS — Z79891 Long term (current) use of opiate analgesic: Secondary | ICD-10-CM

## 2021-04-08 DIAGNOSIS — F419 Anxiety disorder, unspecified: Secondary | ICD-10-CM

## 2021-04-08 DIAGNOSIS — Z882 Allergy status to sulfonamides status: Secondary | ICD-10-CM

## 2021-04-08 DIAGNOSIS — A0471 Enterocolitis due to Clostridium difficile, recurrent: Secondary | ICD-10-CM | POA: Diagnosis not present

## 2021-04-08 DIAGNOSIS — E063 Autoimmune thyroiditis: Secondary | ICD-10-CM | POA: Diagnosis present

## 2021-04-08 DIAGNOSIS — R197 Diarrhea, unspecified: Secondary | ICD-10-CM

## 2021-04-08 DIAGNOSIS — K5090 Crohn's disease, unspecified, without complications: Secondary | ICD-10-CM

## 2021-04-08 DIAGNOSIS — N179 Acute kidney failure, unspecified: Secondary | ICD-10-CM | POA: Diagnosis present

## 2021-04-08 DIAGNOSIS — G8929 Other chronic pain: Secondary | ICD-10-CM

## 2021-04-08 DIAGNOSIS — E282 Polycystic ovarian syndrome: Secondary | ICD-10-CM | POA: Diagnosis present

## 2021-04-08 DIAGNOSIS — R112 Nausea with vomiting, unspecified: Secondary | ICD-10-CM | POA: Diagnosis not present

## 2021-04-08 DIAGNOSIS — Z888 Allergy status to other drugs, medicaments and biological substances status: Secondary | ICD-10-CM

## 2021-04-08 DIAGNOSIS — Z8616 Personal history of COVID-19: Secondary | ICD-10-CM

## 2021-04-08 DIAGNOSIS — N856 Intrauterine synechiae: Secondary | ICD-10-CM | POA: Diagnosis present

## 2021-04-08 DIAGNOSIS — Z79890 Hormone replacement therapy (postmenopausal): Secondary | ICD-10-CM

## 2021-04-08 DIAGNOSIS — E876 Hypokalemia: Secondary | ICD-10-CM | POA: Diagnosis not present

## 2021-04-08 DIAGNOSIS — Z79899 Other long term (current) drug therapy: Secondary | ICD-10-CM

## 2021-04-08 DIAGNOSIS — R109 Unspecified abdominal pain: Secondary | ICD-10-CM | POA: Diagnosis present

## 2021-04-08 HISTORY — DX: Intrauterine synechiae: N85.6

## 2021-04-08 LAB — MAGNESIUM: Magnesium: 1.8 mg/dL (ref 1.7–2.4)

## 2021-04-08 LAB — CBC WITH DIFFERENTIAL/PLATELET
Abs Immature Granulocytes: 0.01 10*3/uL (ref 0.00–0.07)
Basophils Absolute: 0 10*3/uL (ref 0.0–0.1)
Basophils Relative: 1 %
Eosinophils Absolute: 0 10*3/uL (ref 0.0–0.5)
Eosinophils Relative: 0 %
HCT: 34.9 % — ABNORMAL LOW (ref 36.0–46.0)
Hemoglobin: 11.2 g/dL — ABNORMAL LOW (ref 12.0–15.0)
Immature Granulocytes: 0 %
Lymphocytes Relative: 25 %
Lymphs Abs: 1.1 10*3/uL (ref 0.7–4.0)
MCH: 27.7 pg (ref 26.0–34.0)
MCHC: 32.1 g/dL (ref 30.0–36.0)
MCV: 86.4 fL (ref 80.0–100.0)
Monocytes Absolute: 0.4 10*3/uL (ref 0.1–1.0)
Monocytes Relative: 10 %
Neutro Abs: 2.9 10*3/uL (ref 1.7–7.7)
Neutrophils Relative %: 64 %
Platelets: 406 10*3/uL — ABNORMAL HIGH (ref 150–400)
RBC: 4.04 MIL/uL (ref 3.87–5.11)
RDW: 15.2 % (ref 11.5–15.5)
WBC: 4.5 10*3/uL (ref 4.0–10.5)
nRBC: 0 % (ref 0.0–0.2)

## 2021-04-08 LAB — COMPREHENSIVE METABOLIC PANEL
ALT: 11 U/L (ref 0–44)
AST: 19 U/L (ref 15–41)
Albumin: 3.7 g/dL (ref 3.5–5.0)
Alkaline Phosphatase: 50 U/L (ref 38–126)
Anion gap: 10 (ref 5–15)
BUN: 9 mg/dL (ref 6–20)
CO2: 23 mmol/L (ref 22–32)
Calcium: 8.9 mg/dL (ref 8.9–10.3)
Chloride: 103 mmol/L (ref 98–111)
Creatinine, Ser: 0.7 mg/dL (ref 0.44–1.00)
GFR, Estimated: >60 mL/min (ref >60)
Glucose, Bld: 105 mg/dL — ABNORMAL HIGH (ref 70–99)
Potassium: 2.6 mmol/L — CL (ref 3.5–5.1)
Sodium: 136 mmol/L (ref 135–145)
Total Bilirubin: 0.6 mg/dL (ref 0.3–1.2)
Total Protein: 7.2 g/dL (ref 6.5–8.1)

## 2021-04-08 LAB — URINALYSIS, ROUTINE W REFLEX MICROSCOPIC
Bilirubin Urine: NEGATIVE
Glucose, UA: NEGATIVE mg/dL
Hgb urine dipstick: NEGATIVE
Ketones, ur: 5 mg/dL — AB
Leukocytes,Ua: NEGATIVE
Nitrite: NEGATIVE
Protein, ur: NEGATIVE mg/dL
Specific Gravity, Urine: 1.026 (ref 1.005–1.030)
pH: 6 (ref 5.0–8.0)

## 2021-04-08 LAB — C DIFFICILE QUICK SCREEN W PCR REFLEX
C Diff antigen: POSITIVE — AB
C Diff toxin: NEGATIVE

## 2021-04-08 LAB — SARS CORONAVIRUS 2 (TAT 6-24 HRS): SARS Coronavirus 2: NEGATIVE

## 2021-04-08 LAB — I-STAT BETA HCG BLOOD, ED (MC, WL, AP ONLY): I-stat hCG, quantitative: <5 m[IU]/mL (ref <5)

## 2021-04-08 LAB — SEDIMENTATION RATE: Sed Rate: 15 mm/hr (ref 0–22)

## 2021-04-08 LAB — CLOSTRIDIUM DIFFICILE BY PCR, REFLEXED: Toxigenic C. Difficile by PCR: NEGATIVE

## 2021-04-08 LAB — C-REACTIVE PROTEIN: CRP: 0.6 mg/dL (ref <1.0)

## 2021-04-08 LAB — LIPASE, BLOOD: Lipase: 33 U/L (ref 11–51)

## 2021-04-08 MED ORDER — SODIUM CHLORIDE 0.9 % IV BOLUS
1000.0000 mL | Freq: Once | INTRAVENOUS | Status: AC
  Administered 2021-04-08: 1000 mL via INTRAVENOUS

## 2021-04-08 MED ORDER — POTASSIUM CHLORIDE 10 MEQ/100ML IV SOLN
10.0000 meq | INTRAVENOUS | Status: AC
  Administered 2021-04-08 (×2): 10 meq via INTRAVENOUS
  Filled 2021-04-08 (×2): qty 100

## 2021-04-08 MED ORDER — ACETAMINOPHEN 650 MG RE SUPP: 650.0000 mg | Freq: Four times a day (QID) | RECTAL | Status: DC | PRN

## 2021-04-08 MED ORDER — ACETAMINOPHEN 325 MG PO TABS: 650.0000 mg | ORAL_TABLET | Freq: Four times a day (QID) | ORAL | Status: DC | PRN

## 2021-04-08 MED ORDER — DIPHENHYDRAMINE HCL 50 MG/ML IJ SOLN
25.0000 mg | INTRAMUSCULAR | Status: DC | PRN
  Administered 2021-04-08 – 2021-04-11 (×10): 25 mg via INTRAVENOUS
  Filled 2021-04-08 (×10): qty 1

## 2021-04-08 MED ORDER — IOHEXOL 300 MG/ML  SOLN
100.0000 mL | Freq: Once | INTRAMUSCULAR | Status: AC | PRN
  Administered 2021-04-08: 100 mL via INTRAVENOUS

## 2021-04-08 MED ORDER — HYDROMORPHONE HCL 1 MG/ML IJ SOLN
0.5000 mg | INTRAMUSCULAR | Status: DC | PRN
  Administered 2021-04-08: 0.5 mg via INTRAVENOUS
  Filled 2021-04-08: qty 0.5

## 2021-04-08 MED ORDER — MORPHINE SULFATE (PF) 2 MG/ML IV SOLN
2.0000 mg | INTRAVENOUS | Status: DC | PRN
  Filled 2021-04-08: qty 1

## 2021-04-08 MED ORDER — FLUOXETINE HCL 20 MG PO CAPS
60.0000 mg | ORAL_CAPSULE | Freq: Every morning | ORAL | Status: DC
  Administered 2021-04-10 – 2021-04-11 (×2): 60 mg via ORAL
  Filled 2021-04-08 (×2): qty 3

## 2021-04-08 MED ORDER — HYDROMORPHONE HCL 1 MG/ML IJ SOLN
0.5000 mg | Freq: Once | INTRAMUSCULAR | Status: AC
  Administered 2021-04-08: 0.5 mg via INTRAVENOUS
  Filled 2021-04-08: qty 1

## 2021-04-08 MED ORDER — HYDROMORPHONE HCL 1 MG/ML IJ SOLN
0.5000 mg | INTRAMUSCULAR | Status: DC | PRN
  Administered 2021-04-08 – 2021-04-11 (×28): 0.5 mg via INTRAVENOUS
  Filled 2021-04-08 (×28): qty 0.5

## 2021-04-08 MED ORDER — SODIUM CHLORIDE 0.9% FLUSH
3.0000 mL | Freq: Two times a day (BID) | INTRAVENOUS | Status: DC
  Administered 2021-04-08 – 2021-04-11 (×7): 3 mL via INTRAVENOUS

## 2021-04-08 MED ORDER — LEVOTHYROXINE SODIUM 88 MCG PO TABS
88.0000 ug | ORAL_TABLET | Freq: Every day | ORAL | Status: DC
  Administered 2021-04-09 – 2021-04-11 (×3): 88 ug via ORAL
  Filled 2021-04-08 (×3): qty 1

## 2021-04-08 MED ORDER — DIPHENHYDRAMINE HCL 50 MG/ML IJ SOLN
12.5000 mg | INTRAMUSCULAR | Status: DC | PRN
  Administered 2021-04-08: 12.5 mg via INTRAVENOUS
  Filled 2021-04-08: qty 1

## 2021-04-08 MED ORDER — VANCOMYCIN 50 MG/ML ORAL SOLUTION
125.0000 mg | Freq: Four times a day (QID) | ORAL | Status: DC
  Filled 2021-04-08 (×2): qty 2.5

## 2021-04-08 MED ORDER — METRONIDAZOLE IN NACL 5-0.79 MG/ML-% IV SOLN
500.0000 mg | INTRAVENOUS | Status: AC
  Administered 2021-04-08: 500 mg via INTRAVENOUS
  Filled 2021-04-08: qty 100

## 2021-04-08 MED ORDER — POTASSIUM CHLORIDE CRYS ER 20 MEQ PO TBCR
40.0000 meq | EXTENDED_RELEASE_TABLET | Freq: Once | ORAL | Status: AC
  Administered 2021-04-08: 40 meq via ORAL
  Filled 2021-04-08: qty 2

## 2021-04-08 MED ORDER — DIPHENHYDRAMINE HCL 50 MG/ML IJ SOLN
25.0000 mg | Freq: Once | INTRAMUSCULAR | Status: AC
  Administered 2021-04-08: 25 mg via INTRAVENOUS
  Filled 2021-04-08: qty 1

## 2021-04-08 MED ORDER — ONDANSETRON HCL 4 MG/2ML IJ SOLN
4.0000 mg | Freq: Once | INTRAMUSCULAR | Status: AC
  Administered 2021-04-08: 4 mg via INTRAVENOUS
  Filled 2021-04-08: qty 2

## 2021-04-08 MED ORDER — HYDROCODONE-ACETAMINOPHEN 5-325 MG PO TABS: 1.0000 | ORAL_TABLET | ORAL | Status: DC | PRN

## 2021-04-08 MED ORDER — GABAPENTIN 100 MG PO CAPS
200.0000 mg | ORAL_CAPSULE | Freq: Every day | ORAL | Status: DC
  Administered 2021-04-10 – 2021-04-11 (×2): 200 mg via ORAL
  Filled 2021-04-08 (×3): qty 2

## 2021-04-08 MED ORDER — LACTATED RINGERS IV SOLN: INTRAVENOUS | Status: AC

## 2021-04-08 MED ORDER — METRONIDAZOLE IN NACL 5-0.79 MG/ML-% IV SOLN
500.0000 mg | Freq: Three times a day (TID) | INTRAVENOUS | Status: DC
  Administered 2021-04-08 – 2021-04-09 (×2): 500 mg via INTRAVENOUS
  Filled 2021-04-08 (×3): qty 100

## 2021-04-08 MED ORDER — OXYCODONE HCL 5 MG PO TABS
10.0000 mg | ORAL_TABLET | Freq: Once | ORAL | Status: DC
  Filled 2021-04-08: qty 2

## 2021-04-08 MED ORDER — POTASSIUM CHLORIDE 10 MEQ/100ML IV SOLN
10.0000 meq | INTRAVENOUS | Status: AC
  Administered 2021-04-08 (×4): 10 meq via INTRAVENOUS
  Filled 2021-04-08 (×4): qty 100

## 2021-04-08 MED ORDER — ALPRAZOLAM 1 MG PO TABS
1.0000 mg | ORAL_TABLET | Freq: Two times a day (BID) | ORAL | Status: DC
  Administered 2021-04-08 – 2021-04-11 (×5): 1 mg via ORAL
  Filled 2021-04-08 (×6): qty 1

## 2021-04-08 NOTE — ED Notes (Signed)
Per pt request, potassium IV ran slowly to reduce burning.

## 2021-04-08 NOTE — Plan of Care (Signed)

## 2021-04-08 NOTE — ED Notes (Signed)
Pt is aware of order for urine specimen, stated that she is unable to provide sample at this time. Pt was provided with water, will inform staff when she is able to provide sample. RN aware.

## 2021-04-08 NOTE — ED Notes (Signed)
Bedside commode place in pts room, c/o increased pain and nausea. Notified PA Ivin Booty

## 2021-04-08 NOTE — ED Triage Notes (Signed)
Pt c/o diffuse abdominal pain and diarrhea x 3 weeks. Dx with C.diff. Hx chrons.

## 2021-04-08 NOTE — ED Provider Notes (Signed)
[facility]-EMERGENCY DEPT Provider Note   CSN: [id number] Arrival date & time: 04/08/21  0516     History Chief Complaint  Patient presents with  . Diarrhea  . Abdominal Pain    Kimberly Valencia is a 35 y.o. female.  Patient with h/o Crohn's disease, hashimoto thyroiditis, anxiety, uterine fibroids, PCOS, and C diff -- presents the emergency department for evaluation of abdominal pain, nausea, vomiting, watery diarrhea.  Was diagnosed with C. difficile during a hospitalization in [place] about 2 weeks ago.  She is currently on oral vancomycin.  She also has been prescribed medication for chronic pain in the past.  She states that she is not currently tolerating home pain medications or PO vancomycin over the past 3 days.  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  Patient reports worsening pain over the past several days with persistent diarrhea.  No reported fevers.  She states that she had been on immunosuppressive drugs in the past for Crohn's disease but does not currently using any due to "working in the emergency department" and getting ill frequently.  No urinary symptoms reported.  Per substance reporting database: 14 days of oxycodone 10mg  filled on 04/01/21 and 14 days of Oxycontin filled 04/02/21.         Past Medical History:  Diagnosis Date  . Asherman syndrome     There are no problems to display for this patient.   Past Surgical History:  Procedure Laterality Date  . TONSILLECTOMY       OB History   No obstetric history on file.     No family history on file.  Social History   Tobacco Use  . Smoking status: Never Smoker  . Smokeless tobacco: Never Used  Substance Use Topics  . Alcohol use: Never  . Drug use: Never    Home Medications Prior to Admission medications   Not on File    Allergies    Bentyl [dicyclomine hcl], Compazine [prochlorperazine], Reglan [metoclopramide], and Sulfa  antibiotics  Review of Systems   Review of Systems  Constitutional: Negative for fever.  HENT: Negative for rhinorrhea and sore throat.   Eyes: Negative for redness.  Respiratory: Negative for cough and shortness of breath.   Cardiovascular: Negative for chest pain.  Gastrointestinal: Positive for abdominal pain, diarrhea, nausea and vomiting.  Genitourinary: Negative for dysuria, frequency, hematuria and urgency.  Musculoskeletal: Negative for myalgias.  Skin: Negative for rash.  Neurological: Negative for headaches.    Physical Exam Updated Vital Signs BP 120/84   Pulse 100   Temp 98.1 F (36.7 C) (Oral)   Resp 18   Ht 5\' 1"  (1.549 m)   Wt 41.7 kg   SpO2 100%   BMI 17.38 kg/m   Physical Exam Vitals and nursing note reviewed.  Constitutional:      General: She is not in acute distress.    Appearance: She is well-developed.  HENT:     Head: Normocephalic and atraumatic.     Right Ear: External ear normal.     Left Ear: External ear normal.     Nose: Nose normal.  Eyes:     Conjunctiva/sclera: Conjunctivae normal.  Cardiovascular:     Rate and Rhythm: Normal rate and regular rhythm.     Heart sounds: No murmur heard.   Pulmonary:     Effort: No respiratory distress.     Breath sounds: No wheezing, rhonchi or rales.  Abdominal:  Palpations: Abdomen is soft.     Tenderness: There is abdominal tenderness (bilateral lower quadrants). There is no guarding or rebound.  Musculoskeletal:     Cervical back: Normal range of motion and neck supple.     Right lower leg: No edema.     Left lower leg: No edema.  Skin:    General: Skin is warm and dry.     Findings: No rash.  Neurological:     General: No focal deficit present.     Mental Status: She is alert. Mental status is at baseline.     Motor: No weakness.  Psychiatric:        Mood and Affect: Mood normal.     ED Results / Procedures / Treatments   Labs (all labs ordered are listed, but only abnormal  results are displayed) Labs Reviewed  C DIFFICILE QUICK SCREEN W PCR REFLEX - Abnormal; Notable for the following components:      Result Value   C Diff antigen POSITIVE (*)    All other components within normal limits  CBC WITH DIFFERENTIAL/PLATELET - Abnormal; Notable for the following components:   Hemoglobin 11.2 (*)    HCT 34.9 (*)    Platelets 406 (*)    All other components within normal limits  COMPREHENSIVE METABOLIC PANEL - Abnormal; Notable for the following components:   Potassium 2.6 (*)    Glucose, Bld 105 (*)    All other components within normal limits  CLOSTRIDIUM DIFFICILE BY PCR, REFLEXED  GASTROINTESTINAL PANEL BY PCR, STOOL (REPLACES STOOL CULTURE)  SARS CORONAVIRUS 2 (TAT 6-24 HRS)  SEDIMENTATION RATE  LIPASE, BLOOD  URINALYSIS, ROUTINE W REFLEX MICROSCOPIC  I-STAT BETA HCG BLOOD, ED ([facility], [facility], [facility] ONLY)    EKG None  Radiology No results found.  Procedures Procedures   Medications Ordered in ED Medications  potassium chloride 10 mEq in 100 mL IVPB (10 mEq Intravenous New Bag/Given 04/08/21 1112)  sodium chloride 0.9 % bolus 1,000 mL (0 mLs Intravenous Stopped 04/08/21 0837)  sodium chloride 0.9 % bolus 1,000 mL (0 mLs Intravenous Stopped 04/08/21 0837)  HYDROmorphone (DILAUDID) injection 0.5 mg (0.5 mg Intravenous Given 04/08/21 0659)  ondansetron (ZOFRAN) injection 4 mg (4 mg Intravenous Given 04/08/21 0700)  HYDROmorphone (DILAUDID) injection 0.5 mg (0.5 mg Intravenous Given 04/08/21 0857)  ondansetron (ZOFRAN) injection 4 mg (4 mg Intravenous Given 04/08/21 0857)  potassium chloride SA (KLOR-CON) CR tablet 40 mEq (40 mEq Oral Given 04/08/21 0857)  sodium chloride 0.9 % bolus 1,000 mL (0 mLs Intravenous Stopped 04/08/21 1050)  diphenhydrAMINE (BENADRYL) injection 25 mg (25 mg Intravenous Given 04/08/21 0945)  HYDROmorphone (DILAUDID) injection 0.5 mg (0.5 mg Intravenous Given 04/08/21 1118)    ED Course  I have reviewed the triage vital signs and the  nursing notes.  Pertinent labs & imaging results that were available during my care of the patient were reviewed by me and considered in my medical decision making (see chart for details).  Patient seen and examined. Work-up initiated. Medications ordered.   Vital signs reviewed and are as follows: BP 120/84   Pulse 100   Temp 98.1 F (36.7 C) (Oral)   Resp 18   Ht 5\' 1"  (1.549 m)   Wt 41.7 kg   SpO2 100%   BMI 17.38 kg/m   Patient has called out for pain medication and nausea medication several times.  Her potassium was 2.6.  Oral and IV repletion ordered.  She continues to have significant nausea, uncontrolled  pain and reports 4 episodes of watery diarrhea while in the emergency department.  I talked to her her about her comfort level in regards to admission versus discharge.  At this point she does not feel like she would be able to take the oral vancomycin reliably at home.  She is also concerned about her electrolytes continuing to be abnormal based on persistent diarrhea.  I discussed case with Dr. Dairl Ponder of [facility] who will see patient.     MDM Rules/Calculators/A&P                          Admit.   Final Clinical Impression(s) / ED Diagnoses Final diagnoses:  Nausea vomiting and diarrhea  Chronic abdominal pain    Rx / DC Orders ED Discharge Orders    None       Renne Crigler, PA-C 04/08/21 1135    Koleen Distance, MD 04/08/21 1156

## 2021-04-08 NOTE — ED Triage Notes (Signed)
Emergency Medicine Provider Triage Evaluation Note  Kimberly Valencia , a 35 y.o. female with PMH s/f Crohn's disease, hashimoto thyroiditis, anxiety, uterine fibroids, PCOS, and C diff was evaluated in triage.  Pt complains of abdominal pain, copious diarrhea, persistent vomiting and inability to tolerate PO. Reports being diagnosed with Cdiff 2 weeks ago with an 8 day hospitalization in [place], [place]. Currently on vancomycin. However, chart review also suggestive of chronic c/o abdominal pain, vomiting, diarrhea with ED visits and/or hospitalizations q 2-3 weeks.  Prior endo/colo w/biopsies in Feb 2022 without evidence of active/acute disease. 11 CT scans at [facility] in the past year which are grossly negative for signs of Crohn's flare on all but one occasion.  Review of Systems  Positive: Abdominal pain, vomiting, diarrhea Negative: Fever   Physical Exam  BP (!) 120/99   Pulse (!) 109   Temp 98.1 F (36.7 C) (Oral)   Resp 18   Ht 5\' 1"  (1.549 m)   Wt 41.7 kg   SpO2 98%   BMI 17.38 kg/m  Gen:   Awake, no distress   HEENT:  Atraumatic  Resp:  Normal effort  Cardiac:  Tachycardic rate  Abd:   Nondistended, TTP infraumbilically.  MSK:   Moves extremities without difficulty  Neuro:  Speech clear   Medical Decision Making  Medically screening exam initiated at 5:56 AM.  Appropriate orders placed.  Hamdi Vari was informed that the remainder of the evaluation will be completed by another provider, this initial triage assessment does not replace that evaluation, and the importance of remaining in the ED until their evaluation is complete.  Clinical Impression  Abdominal pain, V/D   Terrance Mass, PA-C 04/08/21 0606

## 2021-04-08 NOTE — H&P (Addendum)
History and Physical        Hospital Admission Note Date: 04/08/2021  Patient name: Kimberly Valencia Medical record number: [id number] Date of birth: 1986/10/06 Age: 35 y.o. Gender: female  PCP: Pcp, No   Chief Complaint    Chief Complaint  Patient presents with  . Diarrhea  . 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 difficile who presented to the ED with abdominal pain, nausea, vomiting, watery diarrhea for the past several days.  Patient states that 3 weeks ago she had a tonsillectomy and 1 week later she was hospitalized in [place] for pyelonephritis and found to have an incidental asymptomatic COVID-19 positive lab.  She also was thought to have a tonsillar abscess and so initially was on Augmentin followed by IV antibiotics for the pyelonephritis.  During her hospitalization she also tested positive for C. difficile and started on p.o. vancomycin.  She completed IV antibiotics in the hospital and was discharged on 10 days of p.o. vancomycin.  Since her discharge she has not felt any better and has continued to have lower abdominal pain, nausea and vomiting and nonbloody diarrhea.  Symptoms worsened 3 days ago and has since been unable to tolerate any p.o. intake and has not tolerated any of her p.o. meds.  She also has not had any urine output over the past 2 days.  Admits to some diaphoresis and right back pain but otherwise no other symptoms.  Symptoms are not consistent with her Crohn's disease and she is not on any immunosuppressants for this.  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     ED Course: Afebrile, hemodynamically stable, on room air. Notable Labs: Sodium 136, K2.6, BUN 9, creatinine 0.7, WBC 4.5, Hb 11.2, platelets 406, C. difficile antigen positive, C. difficile toxin negative, C. difficile PCR negative. Patient received Benadryl, Dilaudid, Zofran, KCl IV (did not take the p.o. KCl), 3 L NS bolus.     Vitals:   04/08/21 1000 04/08/21 1100  BP: 115/81 115/79  Pulse: 69 68  Resp:  17  Temp:    SpO2: 100% 100%     Review of Systems:  Review of Systems  All other systems reviewed and are negative.   Medical/Social/Family History   Past Medical History: Past Medical History:  Diagnosis Date  . Asherman syndrome     Past Surgical History:  Procedure Laterality Date  . TONSILLECTOMY      Medications: Prior to Admission medications   Medication Sig Start Date End Date Taking? Authorizing Provider  ALPRAZolam Duanne Moron) 1 MG tablet Take 1 mg by mouth 2 (two) times daily as needed for anxiety. 03/31/21   [provider]  fluconazole (DIFLUCAN) 150 MG tablet Take 150 mg by mouth once. 04/02/21   [provider]  FLUoxetine (PROZAC) 20 MG capsule Take 60 mg by mouth every morning. 03/30/21  [provider]  gabapentin (NEURONTIN) 100 MG capsule Take 200 mg by mouth daily. 03/15/21   [provider]  Oxycodone HCl 10 MG TABS Take 10 mg by mouth 4 (four) times daily as needed for pain. 04/01/21   [provider]  valACYclovir (VALTREX) 500 MG tablet Take 500 mg by mouth daily. 03/16/21   [provider]  vancomycin (VANCOCIN) 125 MG capsule Take 125 mg by mouth 4 (four) times daily. 03/31/21   [provider]  zolpidem (AMBIEN) 10 MG tablet Take 10 mg by mouth at bedtime as needed for sleep. 03/20/21   [provider]    Allergies:   Allergies  Allergen Reactions  . Bentyl [Dicyclomine Hcl]   . Compazine [Prochlorperazine]   . Reglan [Metoclopramide]    . Sulfa Antibiotics     Social History:  reports that she has never smoked. She has never used smokeless tobacco. She reports that she does not drink alcohol and does not use drugs.  Family History: No family history on file.   Objective   Physical Exam: Blood pressure 115/79, pulse 68, temperature 98.1 F (36.7 C), temperature source Oral, resp. rate 17, height '5\' 1"'  (1.549 m), weight 41.7 kg, SpO2 100 %.  Physical Exam Vitals and nursing note reviewed.  Constitutional:      General: She is not in acute distress. HENT:     Head: Normocephalic.  Cardiovascular:     Rate and Rhythm: Normal rate and regular rhythm.  Pulmonary:     Effort: Pulmonary effort is normal.     Breath sounds: Normal breath sounds.  Abdominal:     General: Abdomen is flat. There is no distension.     Tenderness: There is generalized abdominal tenderness.     Comments: Generalized tenderness to light palpation  Musculoskeletal:        General: No swelling or tenderness.     Comments: Negative CVA tenderness  Skin:    Coloration: Skin is not jaundiced or pale.  Neurological:     Mental Status: She is alert. Mental status is at baseline.  Psychiatric:        Mood and Affect: Mood normal.        Behavior: Behavior normal.     LABS on Admission: I have personally reviewed all the labs and imaging below    Basic Metabolic Panel: Recent Labs  Lab 04/08/21 0550  NA 136  K 2.6*  CL 103  CO2 23  GLUCOSE 105*  BUN 9  CREATININE 0.70  CALCIUM 8.9  MG 1.8   Liver Function Tests: Recent Labs  Lab 04/08/21 0550  AST 19  ALT 11  ALKPHOS 50  BILITOT 0.6  PROT 7.2  ALBUMIN 3.7   Recent Labs  Lab 04/08/21 0550  LIPASE 33   No results for input(s): AMMONIA in the last 168 hours. CBC: Recent Labs  Lab 04/08/21 0550  WBC 4.5  NEUTROABS 2.9  HGB 11.2*  HCT 34.9*  MCV 86.4  PLT 406*   Cardiac Enzymes: No results for input(s): CKTOTAL, CKMB, CKMBINDEX, TROPONINI in the last 168  hours. BNP: Invalid input(s): POCBNP CBG: No results for input(s): GLUCAP in the last 168 hours.  Radiological Exams on Admission:  No results found.    EKG: normal sinus rhythm   A & P   Principal Problem:   Intractable nausea and vomiting Active Problems:   Chronic pain   Crohn disease (HCC)   C. difficile colitis   Hypokalemia  1. Intractable nausea, vomiting, diarrhea of unclear etiology in the setting of recent C. difficile and COVID-19 infection a. Most recent C. difficile infection was reportedly her third recurrence b. C. difficile antigen positive, toxin negative, PCR negative c. will continue home p.o. vancomycin if she tolerates and may need a longer taper at discharge i. She is not tolerating her PO vanc -> Metronidazole IV for now until tolerating PO d. Continue IV fluids e. Follow-up stool studies f. Benadryl for nausea as she has better relief with this g. CT abd/pelvis h. Clear liquid diet as able advance as tolerated  2. Suspected AKI a. Based on patient's reported oliguria/anuria for the past 48 hours b. Creatinine 0.7 c. IV fluids d. UA when able e. Monitor intake and output  3. Crohn's disease, unlikely active flare a. Without an history of surgeries and diarrhea is nonbloody b. Not on any immunosuppressants c. ESR not elevated, checking CRP d. Will consider GI consult pending above workup  4. Hypokalemia due to GI losses a. Replete IV b. Did not tolerate PO c. Check Mg  5. Chronic Pain secondary to Asherman's disease a. Does not appear to be in active withdrawal as her symptoms appear more likely to be GI., though this is not totally ruled out b. Continue morphine PRN for pain  6. Anxiety a. Continue home xanax  7. Recent COVID 19 infection a. Positive on 03/24/21     DVT prophylaxis: SCDs   Code Status: Full Code  Diet: clear liquid diet if able Family Communication: Admission, patients condition and plan of care including  tests being ordered have been discussed with the patient who indicates understanding and agrees with the plan and Code Status.  Disposition Plan: The appropriate patient status for this patient is OBSERVATION. Observation status is judged to be reasonable and necessary in order to provide the required intensity of service to ensure the patient's safety. The patient's presenting symptoms, physical exam findings, and initial radiographic and laboratory data in the context of their medical condition is felt to place them at decreased risk for further clinical deterioration. Furthermore, it is anticipated that the patient will be medically stable for discharge from the hospital within 2 midnights of admission. The following factors support the patient status of observation.   " The patient's presenting symptoms include abdominal pain, n/v. " The physical exam findings include abdominal pain. " The initial radiographic and laboratory data are hypokalemia.     Consultants  . none  Procedures  . none  Time Spent on Admission: 66 minutes    Harold Hedge, DO [facility]  04/08/2021, 1:00 PM

## 2021-04-09 ENCOUNTER — Other Ambulatory Visit (HOSPITAL_COMMUNITY): Payer: Self-pay

## 2021-04-09 DIAGNOSIS — N856 Intrauterine synechiae: Secondary | ICD-10-CM | POA: Diagnosis present

## 2021-04-09 DIAGNOSIS — Z79899 Other long term (current) drug therapy: Secondary | ICD-10-CM | POA: Diagnosis not present

## 2021-04-09 DIAGNOSIS — E063 Autoimmune thyroiditis: Secondary | ICD-10-CM | POA: Diagnosis present

## 2021-04-09 DIAGNOSIS — N179 Acute kidney failure, unspecified: Secondary | ICD-10-CM | POA: Diagnosis present

## 2021-04-09 DIAGNOSIS — R112 Nausea with vomiting, unspecified: Secondary | ICD-10-CM | POA: Diagnosis not present

## 2021-04-09 DIAGNOSIS — Z882 Allergy status to sulfonamides status: Secondary | ICD-10-CM | POA: Diagnosis not present

## 2021-04-09 DIAGNOSIS — A0472 Enterocolitis due to Clostridium difficile, not specified as recurrent: Secondary | ICD-10-CM | POA: Diagnosis present

## 2021-04-09 DIAGNOSIS — R109 Unspecified abdominal pain: Secondary | ICD-10-CM | POA: Diagnosis present

## 2021-04-09 DIAGNOSIS — Z79890 Hormone replacement therapy (postmenopausal): Secondary | ICD-10-CM | POA: Diagnosis not present

## 2021-04-09 DIAGNOSIS — E876 Hypokalemia: Secondary | ICD-10-CM | POA: Diagnosis present

## 2021-04-09 DIAGNOSIS — Z87892 Personal history of anaphylaxis: Secondary | ICD-10-CM | POA: Diagnosis not present

## 2021-04-09 DIAGNOSIS — F419 Anxiety disorder, unspecified: Secondary | ICD-10-CM | POA: Diagnosis present

## 2021-04-09 DIAGNOSIS — Z8616 Personal history of COVID-19: Secondary | ICD-10-CM | POA: Diagnosis not present

## 2021-04-09 DIAGNOSIS — G8929 Other chronic pain: Secondary | ICD-10-CM | POA: Diagnosis present

## 2021-04-09 DIAGNOSIS — Z20822 Contact with and (suspected) exposure to covid-19: Secondary | ICD-10-CM | POA: Diagnosis present

## 2021-04-09 DIAGNOSIS — E282 Polycystic ovarian syndrome: Secondary | ICD-10-CM | POA: Diagnosis present

## 2021-04-09 DIAGNOSIS — K5090 Crohn's disease, unspecified, without complications: Secondary | ICD-10-CM | POA: Diagnosis present

## 2021-04-09 DIAGNOSIS — A0471 Enterocolitis due to Clostridium difficile, recurrent: Secondary | ICD-10-CM | POA: Diagnosis present

## 2021-04-09 DIAGNOSIS — Z79891 Long term (current) use of opiate analgesic: Secondary | ICD-10-CM | POA: Diagnosis not present

## 2021-04-09 DIAGNOSIS — Z888 Allergy status to other drugs, medicaments and biological substances status: Secondary | ICD-10-CM | POA: Diagnosis not present

## 2021-04-09 LAB — BASIC METABOLIC PANEL
Anion gap: 8 (ref 5–15)
BUN: 5 mg/dL — ABNORMAL LOW (ref 6–20)
CO2: 21 mmol/L — ABNORMAL LOW (ref 22–32)
Calcium: 8 mg/dL — ABNORMAL LOW (ref 8.9–10.3)
Chloride: 104 mmol/L (ref 98–111)
Creatinine, Ser: 0.53 mg/dL (ref 0.44–1.00)
GFR, Estimated: >60 mL/min (ref >60)
Glucose, Bld: 58 mg/dL — ABNORMAL LOW (ref 70–99)
Potassium: 3.3 mmol/L — ABNORMAL LOW (ref 3.5–5.1)
Sodium: 133 mmol/L — ABNORMAL LOW (ref 135–145)

## 2021-04-09 LAB — CBC
HCT: 34.2 % — ABNORMAL LOW (ref 36.0–46.0)
Hemoglobin: 10.5 g/dL — ABNORMAL LOW (ref 12.0–15.0)
MCH: 27.2 pg (ref 26.0–34.0)
MCHC: 30.7 g/dL (ref 30.0–36.0)
MCV: 88.6 fL (ref 80.0–100.0)
Platelets: 283 10*3/uL (ref 150–400)
RBC: 3.86 MIL/uL — ABNORMAL LOW (ref 3.87–5.11)
RDW: 15.5 % (ref 11.5–15.5)
WBC: 5.3 10*3/uL (ref 4.0–10.5)
nRBC: 0 % (ref 0.0–0.2)

## 2021-04-09 LAB — HIV ANTIBODY (ROUTINE TESTING W REFLEX): HIV Screen 4th Generation wRfx: NONREACTIVE

## 2021-04-09 LAB — GASTROINTESTINAL PANEL BY PCR, STOOL (REPLACES STOOL CULTURE)

## 2021-04-09 LAB — HEMOGLOBIN A1C
Hgb A1c MFr Bld: 5 % (ref 4.8–5.6)
Mean Plasma Glucose: 96.8 mg/dL

## 2021-04-09 MED ORDER — POTASSIUM CHLORIDE CRYS ER 20 MEQ PO TBCR
40.0000 meq | EXTENDED_RELEASE_TABLET | Freq: Once | ORAL | Status: AC
  Administered 2021-04-09: 40 meq via ORAL
  Filled 2021-04-09: qty 2

## 2021-04-09 MED ORDER — OXYCODONE HCL 5 MG PO TABS
10.0000 mg | ORAL_TABLET | ORAL | Status: DC | PRN
  Administered 2021-04-11 (×2): 10 mg via ORAL
  Filled 2021-04-09 (×2): qty 2

## 2021-04-09 MED ORDER — ONDANSETRON HCL 4 MG/2ML IJ SOLN
4.0000 mg | Freq: Four times a day (QID) | INTRAMUSCULAR | Status: DC
  Administered 2021-04-09 – 2021-04-11 (×8): 4 mg via INTRAVENOUS
  Filled 2021-04-09 (×8): qty 2

## 2021-04-09 MED ORDER — FIDAXOMICIN 200 MG PO TABS
200.0000 mg | ORAL_TABLET | Freq: Two times a day (BID) | ORAL | Status: DC
  Administered 2021-04-09 – 2021-04-11 (×6): 200 mg via ORAL
  Filled 2021-04-09 (×8): qty 1

## 2021-04-09 NOTE — TOC Initial Note (Signed)
Transition of Care [facility]) - Initial/Assessment Note    Patient Details  Name: Kimberly Valencia MRN: [id number] Date of Birth: 06/30/1986  Transition of Care [facility]) CM/SW Contact:    Kimberly Rogue, LCSW Phone Number: 04/09/2021, 3:52 PM  Clinical Narrative:  Patient seen in response to MD consult for PCP.  Offered to get her an appointment at a [facility].  She would prefer to set up her own in-network provider.  Kimberly Valencia was given list of local providers, as well as list of requested pain clinics.  TOC will continue to follow during the course of hospitalization.                  Expected Discharge Plan: Home/Self Care Barriers to Discharge: No Barriers Identified   Patient Goals and CMS Choice        Expected Discharge Plan and Services Expected Discharge Plan: Home/Self Care                                              Prior Living Arrangements/Services                       Activities of Daily Living Home Assistive Devices/Equipment: None ADL Screening (condition at time of admission) Patient's cognitive ability adequate to safely complete daily activities?: Yes Is the patient deaf or have difficulty hearing?: No Does the patient have difficulty seeing, even when wearing glasses/contacts?: No Does the patient have difficulty concentrating, remembering, or making decisions?: No Patient able to express need for assistance with ADLs?: Yes Does the patient have difficulty dressing or bathing?: No Independently performs ADLs?: Yes (appropriate for developmental age) Does the patient have difficulty walking or climbing stairs?: No Weakness of Legs: Both Weakness of Arms/Hands: Both  Permission Sought/Granted                  Emotional Assessment              Admission diagnosis:  Hypokalemia [E87.6] Chronic abdominal pain [R10.9, G89.29] Nausea vomiting and diarrhea [R11.2, R19.7] Intractable nausea and vomiting [R11.2] C. difficile  diarrhea [A04.72] Patient Active Problem List   Diagnosis Date Noted  . C. difficile diarrhea 04/09/2021  . Intractable nausea and vomiting 04/08/2021  . Chronic pain 04/08/2021  . Crohn disease (HCC) 04/08/2021  . C. difficile colitis 04/08/2021  . AKI (acute kidney injury) (HCC) 04/08/2021  . Hypokalemia 04/08/2021   PCP:  Pcp, No Pharmacy:   [id number] - [place], [place] - [address] AT [facility] [address] [place] [address] Phone: [phone] Fax: [phone]     Social Determinants of Health (SDOH) Interventions    Readmission Risk Interventions No flowsheet data found.

## 2021-04-09 NOTE — Consult Note (Addendum)
Referring Provider: Dr. Fayrene Helper Primary Care Physician:  Pcp, No Primary Gastroenterologist:  Althia Forts  Reason for Consultation:  Diarrhea  HPI: Kimberly Valencia is a 35 y.o. female with past medical history of Crohn's disease, H pylori gastritis (01/2021) COVID-19 (12/28/2020 and 03/24/2021, asymptomatic, tested negative in between),chronic pain secondary to Asherman's syndromeon chronic opiates, hypothyroidism, PCOS, and recurrent C. Difficile presenting for consultation of diarrhea.  Patient reports that approximately 2 weeks ago, she started having severe diarrhea and tested positive for C. difficile.  She was on approximately 10 days of vancomycin.  She states her symptoms persisted and thus she presented to the ED.  She states she is currently having 15 bowel movements per day, which she states other consistency of "chocolate pudding."  She reports associated lower abdominal pain.  She also reports nausea and had several episodes of vomiting prior to admission.  She denies any rectal bleeding or melena.  She states this does not feel like her prior Crohn's flares.  She has a longstanding history of Crohn's disease, first diagnosed at age 40.  She was on Remicade for 8 years but developed antibodies and had an anaphylactic reaction.  She was then on Cimzia which did not provide relief of symptoms.  She was then transition to Humira, which worked fairly well but she eventually had refractory disease.  She states she has not been on any Biologics in approximately 2-1/2 years.  She was also previously on mesalamine but states that she has never been on azathioprine, 6-MP, or methotrexate.  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  Reports a maternal uncle with Crohn's.  Last colonoscopy 01/2021 did not show evidence of active disease: The examined portion of the ileum was normal (biopsies unremarkable, negative for  acute or chronic ileitis, negative for dysplasia or granuloma).  The splenic flexure, transverse colon, hepatic flexure, ascending colon and cecum were normal (biopsies unremarkable, negative for acute or chronic colitis, negative for dysplasia or granuloma). Altered vascular and scarred mucosa in the rectum, in the recto-sigmoid colon, in the sigmoid colon and in the descending colon (biopsies unremarkable, negative for acute or chronic colitis, negative for dysplasia or granuloma). Non-bleeding internal hemorrhoids.   EGD 01/2021: Bilious gastric fluid. Gastritis (H pylori chronic active gastritis).  Unremarkable random duodenal biopsies.  Pt reports she was treated for H. Pylori.  Past Medical History:  Diagnosis Date  . Asherman syndrome     Past Surgical History:  Procedure Laterality Date  . TONSILLECTOMY      Prior to Admission medications   Medication Sig Start Date End Date Taking? Authorizing Provider  ALPRAZolam Duanne Moron) 1 MG tablet Take 1 mg by mouth 2 (two) times daily. 03/31/21  Yes [provider]  FLUoxetine (PROZAC) 20 MG capsule Take 60 mg by mouth every morning. 03/30/21  Yes [provider]  gabapentin (NEURONTIN) 100 MG capsule Take 200 mg by mouth daily. 03/15/21  Yes [provider]  levothyroxine (SYNTHROID) 88 MCG tablet Take 88 mcg by mouth daily before breakfast.   Yes [provider]  Oxycodone HCl 10 MG TABS Take 10 mg by mouth every 4 (four) hours as needed for pain. 04/01/21  Yes [provider]  vancomycin (VANCOCIN) 125 MG capsule Take 125 mg by mouth 4 (four) times daily. 03/31/21  Yes [provider]  zolpidem (AMBIEN) 10 MG tablet Take 10 mg by mouth at bedtime. 03/20/21  Yes [provider]  fluconazole (DIFLUCAN) 150 MG tablet Take 150 mg by mouth once. 04/02/21   [provider]    Scheduled Meds: . ALPRAZolam  1 mg Oral BID  . fidaxomicin  200 mg Oral BID  . FLUoxetine  60 mg Oral q  morning  . gabapentin  200 mg Oral Daily  . levothyroxine  88 mcg Oral Q0600  . ondansetron (ZOFRAN) IV  4 mg Intravenous Q6H  . sodium chloride flush  3 mL Intravenous Q12H   Continuous Infusions: PRN Meds:.acetaminophen **OR** acetaminophen, diphenhydrAMINE, HYDROmorphone (DILAUDID) injection, oxyCODONE  Allergies as of 04/08/2021 - Review Complete 04/08/2021  Allergen Reaction Noted  . Bentyl [dicyclomine hcl]  04/08/2021  . Compazine [prochlorperazine]  04/08/2021  . Reglan [metoclopramide]  04/08/2021  . Sulfa antibiotics  04/08/2021    No family history on file.  Social History   Socioeconomic History  . Marital status: Divorced    Spouse name: Not on file  . Number of children: Not on file  . Years of education: Not on file  . Highest education level: Not on file  Occupational History  . Not on file  Tobacco Use  . Smoking status: Never Smoker  . Smokeless tobacco: Never Used  Substance and Sexual Activity  . Alcohol use: Never  . Drug use: Never  . Sexual activity: Not on file  Other Topics Concern  . Not on file  Social History Narrative  . Not on file   Social Determinants of Health   Financial Resource Strain: Not on file  Food Insecurity: Not on file  Transportation Needs: Not on file  Physical Activity: Not on file  Stress: Not on file  Social Connections: Not on file  Intimate Partner Violence: Not on file    Review of Systems: Review of Systems  Constitutional: Negative for fever and weight loss.  HENT: Negative for hearing loss and tinnitus.   Eyes: Negative for pain and redness.  Respiratory: Negative for cough and shortness of breath.   Cardiovascular: Negative for chest pain and palpitations.  Gastrointestinal: Positive for abdominal pain, diarrhea and nausea. Negative for blood in stool, constipation, heartburn, melena and vomiting.  Genitourinary: Negative for flank pain and hematuria.  Musculoskeletal: Negative for falls and joint  pain.  Skin: Negative for itching and rash.  Neurological: Negative for seizures and loss of consciousness.  Endo/Heme/Allergies: Negative for polydipsia. Does not bruise/bleed easily.  Psychiatric/Behavioral: Negative for substance abuse. The patient is not nervous/anxious.      Physical Exam: Vital signs: Vitals:   04/08/21 2118 04/09/21 0312  BP: 106/85 121/83  Pulse: 72 67  Resp: 18 18  Temp: 97.9 F (36.6 C) (!) 97.4 F (36.3 C)  SpO2: 100% 100%   Last BM Date: 04/08/21 Physical Exam Vitals reviewed.  Constitutional:      General: She is not in acute distress.    Appearance: She is underweight.  HENT:     Head: Normocephalic and atraumatic.     Nose: Nose normal. No congestion.     Mouth/Throat:     Mouth: Mucous membranes are moist.     Pharynx: Oropharynx is clear.  Eyes:     General: No scleral icterus.    Extraocular Movements: Extraocular movements intact.     Conjunctiva/sclera: Conjunctivae normal.  Cardiovascular:     Rate and Rhythm: Normal rate and regular rhythm.  Pulmonary:     Effort: Pulmonary effort is normal.     Breath sounds: Normal breath sounds.  Abdominal:  General: Bowel sounds are increased. There is no distension.     Palpations: Abdomen is soft. There is no mass.     Tenderness: There is abdominal tenderness (RLQ, LLQ). There is no guarding or rebound.     Hernia: No hernia is present.  Skin:    General: Skin is warm and dry.  Neurological:     General: No focal deficit present.     Mental Status: She is oriented to person, place, and time. She is lethargic.  Psychiatric:        Mood and Affect: Mood normal.        Behavior: Behavior normal. Behavior is cooperative.      GI:  Lab Results: Recent Labs    04/08/21 0550 04/09/21 0443  WBC 4.5 5.3  HGB 11.2* 10.5*  HCT 34.9* 34.2*  PLT 406* 283   BMET Recent Labs    04/08/21 0550 04/09/21 0443  NA 136 133*  K 2.6* 3.3*  CL 103 104  CO2 23 21*  GLUCOSE 105* 58*   BUN 9 5*  CREATININE 0.70 0.53  CALCIUM 8.9 8.0*   LFT Recent Labs    04/08/21 0550  PROT 7.2  ALBUMIN 3.7  AST 19  ALT 11  ALKPHOS 50  BILITOT 0.6   PT/INR No results for input(s): LABPROT, INR in the last 72 hours.   Studies/Results: CT ABDOMEN PELVIS W CONTRAST  Result Date: 04/08/2021 CLINICAL DATA:  Nausea, vomiting, abdominal pain, Crohn's disease, C diff EXAM: CT ABDOMEN AND PELVIS WITH CONTRAST TECHNIQUE: Multidetector CT imaging of the abdomen and pelvis was performed using the standard protocol following bolus administration of intravenous contrast. CONTRAST:  12m OMNIPAQUE IOHEXOL 300 MG/ML  SOLN COMPARISON:  None. FINDINGS: Lower chest: No acute abnormality.  Bilateral breast implants. Hepatobiliary: No solid liver abnormality is seen. No gallstones, gallbladder wall thickening, or biliary dilatation. Pancreas: Unremarkable. No pancreatic ductal dilatation or surrounding inflammatory changes. Spleen: Normal in size without significant abnormality. Adrenals/Urinary Tract: Adrenal glands are unremarkable. Kidneys are normal, without renal calculi, solid lesion, or hydronephrosis. Bladder is unremarkable. Stomach/Bowel: Stomach is within normal limits. Appendix appears normal. No evidence of bowel wall thickening, distention, or inflammatory changes. Vascular/Lymphatic: No significant vascular findings are present. No enlarged abdominal or pelvic lymph nodes. Reproductive: No mass or other significant abnormality. Ovarian cysts and follicles. Other: No abdominal wall hernia or abnormality. No abdominopelvic ascites. Musculoskeletal: No acute or significant osseous findings. IMPRESSION: 1. No acute CT findings of the abdomen or pelvis to explain nausea, vomiting, or abdominal pain. 2.  No CT findings of Crohn's disease. 3.  No CT evidence of colitis. Electronically Signed   By: AEddie CandleM.D.   On: 04/08/2021 18:58    Impression: Diarrhea: Suspect ongoing C. difficile  diarrhea.  C. difficile antigen positive, toxin negative, PCR negative (suspect recent antibiotic use may have led to negative PCR/toxin). -Most recent colonoscopy 01/2021 did not show any evidence of active Crohn's disease. -CT abdomen/pelvis yesterday did not show evidence of active Crohn's disease or any bowel thickening. -Normal CRP/ESR  Plan: Continue Dificid BID for suspected C. Diff.  Would avoid steroids at this time, given concern for infection and no evidence of active Crohn's disease, as well as past steroid-induced psychosis and other adverse effects.  Full liquid diet okay from GI standpoint.  As an outpatient, she will need H. Pylori stool Ag testing to confirm eradication.  [facility] will follow.    LOS: 0 days   ABryson Ha  Baron-Johnson  PA-C 04/09/2021, 1:11 PM  Contact #  [phone]

## 2021-04-09 NOTE — Progress Notes (Signed)
PROGRESS NOTE    Kimberly Valencia  GLO:[id number] DOB: 12/29/1986 DOA: 04/08/2021 PCP: Pcp, No   Chief Complaint  Patient presents with  . Diarrhea  . 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 difficile who presented to the ED with abdominal pain, nausea, vomiting, watery diarrhea for the past several days.  Patient states that 3 weeks ago she had Kimberly Valencia tonsillectomy and 1 week later she was hospitalized in [place] for pyelonephritis and found to have an incidental asymptomatic COVID-19 positive lab.  She also was thought to have Kimberly Valencia tonsillar abscess and so initially was on Augmentin followed by IV antibiotics for the pyelonephritis.  During her hospitalization she also tested positive for C. difficile and started on p.o. vancomycin.  She completed IV antibiotics in the hospital and was discharged on 10 days of p.o. vancomycin.  Since her discharge she has not felt any better and has continued to have lower abdominal pain, nausea and vomiting and nonbloody diarrhea.  Symptoms worsened 3 days ago and has since been unable to tolerate any p.o. intake and has not tolerated any of her p.o. meds.    She's been admitted for nausea, diarrhea, and abdominal pain.  Assessment & Plan:   Principal Problem:   Intractable nausea and vomiting Active Problems:   Chronic pain   Crohn disease (HCC)   C. difficile colitis   Hypokalemia  1. Intractable nausea, vomiting, diarrhea  Abdominal Pain  Darrall Strey. Unclear etiology, hx crohn's, recent c diff positive b. CT abd/pelvis without acute findings c. Not tolerating PO vanc -> transition to dificid  d. Positive c diff, negative toxin  e. Negative GI path panel  f. With lower  abdominal/pelvic discomfort, she notes recent negative STI testing - will defer repeat here g. Schedule zofran.  She notes benadryl works for nausea, will try to transition to PO when able.   h. advance as tolerated i. GI c/s, appreciate recommendations  2. C difficile infectino 1. Negative toxin, positive c diff - but with c/o diarrhea, will continue treatment - transition to dificid  2. Last C diff infection ~2 years ago and had episode prior to that as well.  3. Suspected AKI Corbitt Cloke. Based on patient's reported oliguria/anuria for the past 48 hours b. Creatinine 0.7 at presentation c. Improved to 0.53 today d. Continue IVF for now e. UA bland f. Monitor intake and output  3. History of Crohn's disease, unlikely active flare Kimberly Valencia. Complicated history.  Not on steroids or immunosuppressants - per chart review, care everywhere note from 01/29/2021, was previously treated with remicade (with antibody development and subsequent infusion reactions) and humira/cimzia (c/b recurrent infections and steroid intolerance/adverse effects).  She has not seen her GI doctor in [place] recently.  b. Pathology from care everywhere reviewed -> 02/01/21 with h pylori associated chronic gastritis, negative for acute or chronic colitis.  06/17/20 with chronic esophagitis/gastritis, but small bowel without abnormality.  2/21 bx with chronic h pylori gastritis, but normal small intestine and colon.  c. ESR not elevated, checking CRP wnl d. GI c/s as noted above, she'll need outpatient follow up as well  4. Hypokalemia due to GI losses Pavel Gadd. Replace as tolerated  5. Chronic Pain secondary to Asherman's disease [name]. Continue opiates for pain management  6. Anxiety [name]. Continue home xanax  7. Recent COVID 19 infection Kimberly Valencia. Positive on 03/24/21  DVT prophylaxis: SCD Code Status: full  Family Communication: none at bedside Disposition:   Status is: Observation  The patient remains OBS appropriate and will d/c before 2  midnights. She may need to stay until tomorrow if continued abdominal pain, nausea, inability to tolerate PO.  Dispo: The patient is from: Home              Anticipated d/c is to: Home              Patient currently is not medically stable to d/c.   Difficult to place patient No       Consultants:   GI  Procedures:   none  Antimicrobials:  Anti-infectives (From admission, onward)   Start     Dose/Rate Route Frequency Ordered Stop   04/09/21 0000  metroNIDAZOLE (FLAGYL) IVPB 500 mg        500 mg 100 mL/hr over 60 Minutes Intravenous Every 8 hours 04/08/21 1615     04/08/21 1615  metroNIDAZOLE (FLAGYL) IVPB 500 mg       Note to Pharmacy: Not tolerating PO intake, including her PO vanc   500 mg 100 mL/hr over 60 Minutes Intravenous NOW 04/08/21 1601 04/08/21 1901   04/08/21 1400  vancomycin (VANCOCIN) 50 mg/mL oral solution 125 mg  Status:  Discontinued        125 mg Oral 4 times daily 04/08/21 1230 04/08/21 1601         Subjective: C/o abdominal discomfort  Nausea Says zofran doesn't work.  Benadryl, phenergan work.   Objective: Vitals:   04/08/21 1317 04/08/21 1739 04/08/21 2118 04/09/21 0312  BP: 112/69 116/78 106/85 121/83  Pulse: 70 69 72 67  Resp: '16 18 18 18  ' Temp: 98.1 F (36.7 C) 98.1 F (36.7 C) 97.9 F (36.6 C) (!) 97.4 F (36.3 C)  TempSrc: Oral Oral Oral Oral  SpO2: 100% 100% 100% 100%  Weight:      Height:        Intake/Output Summary (Last 24 hours) at 04/09/2021 1208 Last data filed at 04/09/2021 0500 Gross per 24 hour  Intake 100 ml  Output 1500 ml  Net -1400 ml   Filed Weights   04/08/21 0527  Weight: 41.7 kg    Examination:  General exam: Appears calm and comfortable  Respiratory system: Clear to auscultation. Respiratory effort normal. Cardiovascular system: S1 & S2 heard, RRR.  Gastrointestinal system: Abdomen is nondistended, soft and mildly ttp to lower quadrants Central nervous system: Alert and oriented. No focal  neurological deficits. Extremities: no LEE Skin: No rashes, lesions or ulcers Psychiatry: Judgement and insight appear normal. Mood & affect appropriate.     Data Reviewed: I have personally reviewed following labs and imaging studies  CBC: Recent Labs  Lab 04/08/21 0550 04/09/21 0443  WBC 4.5 5.3  NEUTROABS 2.9  --   HGB 11.2* 10.5*  HCT 34.9* 34.2*  MCV 86.4 88.6  PLT 406* 193    Basic Metabolic Panel: Recent Labs  Lab 04/08/21 0550 04/09/21 0443  NA 136 133*  K 2.6* 3.3*  CL 103 104  CO2 23 21*  GLUCOSE 105* 58*  BUN 9 5*  CREATININE 0.70 0.53  CALCIUM 8.9 8.0*  MG 1.8  --     GFR: Estimated Creatinine Clearance: 65.8 mL/min (by C-G formula based on SCr of 0.53 mg/dL).  Liver Function Tests: Recent Labs  Lab  04/08/21 0550  AST 19  ALT 11  ALKPHOS 50  BILITOT 0.6  PROT 7.2  ALBUMIN 3.7    CBG: No results for input(s): GLUCAP in the last 168 hours.   Recent Results (from the past 240 hour(s))  C Difficile Quick Screen w PCR reflex     Status: Abnormal   Collection Time: 04/08/21  5:50 AM   Specimen: Stool  Result Value Ref Range Status   C Diff antigen POSITIVE (Barnie Sopko) NEGATIVE Final   C Diff toxin NEGATIVE NEGATIVE Final   C Diff interpretation Results are indeterminate. See PCR results.  Final    Comment: Performed at [facility], [place] [address] [place], [place] [address]  C. Diff by PCR, Reflexed     Status: None   Collection Time: 04/08/21  5:50 AM  Result Value Ref Range Status   Toxigenic C. Difficile by PCR NEGATIVE NEGATIVE Final    Comment: Patient is colonized with non toxigenic C. difficile. May not need treatment unless significant symptoms are present. Performed at [facility], [place] [address], [place], [place] [address]   SARS CORONAVIRUS 2 (TAT 6-24 HRS) Nasopharyngeal Nasopharyngeal Swab     Status: None   Collection Time: 04/08/21 11:13 AM   Specimen: Nasopharyngeal Swab  Result Value Ref Range Status    SARS Coronavirus 2 NEGATIVE NEGATIVE Final    Comment: (NOTE) SARS-CoV-2 target nucleic acids are NOT DETECTED.  The SARS-CoV-2 RNA is generally detectable in upper and lower respiratory specimens during the acute phase of infection. Negative results do not preclude SARS-CoV-2 infection, do not rule out co-infections with other pathogens, and should not be used as the sole basis for treatment or other patient management decisions. Negative results must be combined with clinical observations, patient history, and epidemiological information. The expected result is Negative.  Fact Sheet for Patients: SugarRoll.be  Fact Sheet for Healthcare Providers: https://www.woods-mathews.com/  This test is not yet approved or cleared by the [place] FDA and  has been authorized for detection and/or diagnosis of SARS-CoV-2 by FDA under an Emergency Use Authorization (EUA). This EUA will remain  in effect (meaning this test can be used) for the duration of the COVID-19 declaration under Se ction 564(b)(1) of the Act, 21 U.S.C. section 360bbb-3(b)(1), unless the authorization is terminated or revoked sooner.  Performed at [facility], [place] [address] [place], [place] [address]          Radiology Studies: CT ABDOMEN PELVIS W CONTRAST  Result Date: 04/08/2021 CLINICAL DATA:  Nausea, vomiting, abdominal pain, Crohn's disease, C diff EXAM: CT ABDOMEN AND PELVIS WITH CONTRAST TECHNIQUE: Multidetector CT imaging of the abdomen and pelvis was performed using the standard protocol following bolus administration of intravenous contrast. CONTRAST:  116m OMNIPAQUE IOHEXOL 300 MG/ML  SOLN COMPARISON:  None. FINDINGS: Lower chest: No acute abnormality.  Bilateral breast implants. Hepatobiliary: No solid liver abnormality is seen. No gallstones, gallbladder wall thickening, or biliary dilatation. Pancreas: Unremarkable. No pancreatic ductal dilatation  or surrounding inflammatory changes. Spleen: Normal in size without significant abnormality. Adrenals/Urinary Tract: Adrenal glands are unremarkable. Kidneys are normal, without renal calculi, solid lesion, or hydronephrosis. Bladder is unremarkable. Stomach/Bowel: Stomach is within normal limits. Appendix appears normal. No evidence of bowel wall thickening, distention, or inflammatory changes. Vascular/Lymphatic: No significant vascular findings are present. No enlarged abdominal or pelvic lymph nodes. Reproductive: No mass or other significant abnormality. Ovarian cysts and follicles. Other: No abdominal wall hernia or abnormality. No  abdominopelvic ascites. Musculoskeletal: No acute or significant osseous findings. IMPRESSION: 1. No acute CT findings of the abdomen or pelvis to explain nausea, vomiting, or abdominal pain. 2.  No CT findings of Crohn's disease. 3.  No CT evidence of colitis. Electronically Signed   By: Eddie Candle M.D.   On: 04/08/2021 18:58        Scheduled Meds: . ALPRAZolam  1 mg Oral BID  . FLUoxetine  60 mg Oral q morning  . gabapentin  200 mg Oral Daily  . levothyroxine  88 mcg Oral Q0600  . ondansetron (ZOFRAN) IV  4 mg Intravenous Q6H  . sodium chloride flush  3 mL Intravenous Q12H   Continuous Infusions: . metronidazole 500 mg (04/09/21 0849)     LOS: 0 days    Time spent: over 30 min    Fayrene Helper, MD [facility]   To contact the attending provider between 7A-7P or the covering provider during after hours 7P-7A, please log into the web site [email or web address] and access using universal  password for that web site. If you do not have the password, please call the hospital operator.  04/09/2021, 12:08 PM

## 2021-04-10 DIAGNOSIS — G8929 Other chronic pain: Secondary | ICD-10-CM

## 2021-04-10 DIAGNOSIS — F419 Anxiety disorder, unspecified: Secondary | ICD-10-CM

## 2021-04-10 DIAGNOSIS — R109 Unspecified abdominal pain: Secondary | ICD-10-CM

## 2021-04-10 DIAGNOSIS — R197 Diarrhea, unspecified: Secondary | ICD-10-CM

## 2021-04-10 DIAGNOSIS — R112 Nausea with vomiting, unspecified: Secondary | ICD-10-CM

## 2021-04-10 LAB — BASIC METABOLIC PANEL
Anion gap: 8 (ref 5–15)
BUN: <5 mg/dL — ABNORMAL LOW (ref 6–20)
CO2: 25 mmol/L (ref 22–32)
Calcium: 8.8 mg/dL — ABNORMAL LOW (ref 8.9–10.3)
Chloride: 102 mmol/L (ref 98–111)
Creatinine, Ser: 0.66 mg/dL (ref 0.44–1.00)
GFR, Estimated: >60 mL/min (ref >60)
Glucose, Bld: 117 mg/dL — ABNORMAL HIGH (ref 70–99)
Potassium: 3.7 mmol/L (ref 3.5–5.1)
Sodium: 135 mmol/L (ref 135–145)

## 2021-04-10 LAB — CBC
HCT: 37.8 % (ref 36.0–46.0)
Hemoglobin: 11.6 g/dL — ABNORMAL LOW (ref 12.0–15.0)
MCH: 27.4 pg (ref 26.0–34.0)
MCHC: 30.7 g/dL (ref 30.0–36.0)
MCV: 89.4 fL (ref 80.0–100.0)
Platelets: 333 10*3/uL (ref 150–400)
RBC: 4.23 MIL/uL (ref 3.87–5.11)
RDW: 15 % (ref 11.5–15.5)
WBC: 4.5 10*3/uL (ref 4.0–10.5)
nRBC: 0 % (ref 0.0–0.2)

## 2021-04-10 LAB — MAGNESIUM: Magnesium: 1.7 mg/dL (ref 1.7–2.4)

## 2021-04-10 MED ORDER — MAGNESIUM SULFATE 4 GM/100ML IV SOLN
4.0000 g | Freq: Once | INTRAVENOUS | Status: AC
  Administered 2021-04-10: 4 g via INTRAVENOUS
  Filled 2021-04-10: qty 100

## 2021-04-10 MED ORDER — RESOURCE INSTANT PROTEIN PO PWD PACKET
1.0000 | Freq: Three times a day (TID) | ORAL | Status: DC
  Filled 2021-04-10 (×4): qty 6

## 2021-04-10 MED ORDER — KATE FARMS STANDARD 1.4 PO LIQD
325.0000 mL | Freq: Two times a day (BID) | ORAL | Status: DC
  Filled 2021-04-10 (×3): qty 325

## 2021-04-10 NOTE — Progress Notes (Signed)
Initial Nutrition Assessment  DOCUMENTATION CODES:   Underweight  INTERVENTION:   -[employer] 1.4 po BID, each supplement provides 455 kcal and 20 grams protein.   -Beneprotein TID with meals, provides 25 kcals and 6g protein  -Multivitamin with minerals daily  NUTRITION DIAGNOSIS:   Inadequate oral intake related to nausea,vomiting,diarrhea as evidenced by per patient/family report.  GOAL:   Patient will meet greater than or equal to 90% of their needs  MONITOR:   PO intake,Supplement acceptance,Labs,Weight trends,I & O's  REASON FOR ASSESSMENT:   Malnutrition Screening Tool    ASSESSMENT:   35 y.o. female with PMH s/f Crohn's disease, hashimoto thyroiditis, anxiety, uterine fibroids, PCOS, and C diff was evaluated in triage.  Pt complains of abdominal pain, copious diarrhea, persistent vomiting and inability to tolerate PO. Reports being diagnosed with Cdiff 2 weeks ago with an 8 day hospitalization in [place], [place]. Currently on vancomycin. However, chart review also suggestive of chronic c/o abdominal pain, vomiting, diarrhea with ED visits and/or hospitalizations q 2-3 weeks.  Patient reporting 2-3 weeks of chronic, frequent diarrhea and N/V. Pt had a tonsillectomy and was treated for c.diff 2 weeks ago. Pt has continued to have diarrhea despite treatments. Pt also having abdominal pains. Has been eating poorly as a result of symptoms. Will order plant based protein supplement and Beneprotein powder for additional kcals and protein.  Per weight records in care everywhere, pt's weight has remained stable.   Last 3 weights: 2/4 93 lbs 1/16 91 lbs 12/10/20 95 lbs Pt is underweight based on BMI. Suspect some degree of malnutrition given poor PO and inability to gain weight.   Medications: IV Zofran    Labs reviewed.  NUTRITION - FOCUSED PHYSICAL EXAM:  Unable to complete, will attempt at follow-up  Diet Order:   Diet Order            Diet full liquid Room  service appropriate? Yes; Fluid consistency: Thin  Diet effective now                 EDUCATION NEEDS:   No education needs have been identified at this time  Skin:  Skin Assessment: Reviewed RN Assessment  Last BM:  4/12 -type 7  Height:   Ht Readings from Last 1 Encounters:  04/08/21 5\' 1"  (1.549 m)    Weight:   Wt Readings from Last 1 Encounters:  04/08/21 41.7 kg   BMI:  Body mass index is 17.38 kg/m.  Estimated Nutritional Needs:   Kcal:  1450-1650  Protein:  70-85g  Fluid:  1.7L/day  06/08/21, MS, RD, LDN Inpatient Clinical Dietitian Contact information available via Amion

## 2021-04-10 NOTE — Progress Notes (Signed)
PROGRESS NOTE    Kimberly Valencia  WUJ:[id number] DOB: Feb 23, 1986 DOA: 04/08/2021 PCP: Pcp, No    Chief Complaint  Patient presents with  . Diarrhea  . 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 difficile who presented to the ED with abdominal pain, nausea, vomiting, watery diarrhea for the past several days.Patient states that3 weeks ago she had a tonsillectomy and 1 week later she was hospitalized in [place] for pyelonephritis and found to have an incidental asymptomatic COVID-19 positive lab. She also was thought to have a tonsillar abscess and so initially was on Augmentin followed by IV antibiotics for the pyelonephritis. During her hospitalization she also tested positive for C. difficile and started on p.o. vancomycin. She completed IV antibiotics in the hospital and was discharged on 10 days of p.o. vancomycin. Since her discharge she has not felt any better and has continued to have lower abdominal pain, nausea and vomiting and nonbloody diarrhea. Symptoms worsened 3 days ago and has since been unable to tolerate any p.o. intake and has not tolerated any of her p.o. meds.   She's been admitted for nausea, diarrhea, and abdominal pain.   Assessment & Plan:   Principal Problem:   Intractable nausea and vomiting Active Problems:   Chronic pain   Crohn disease (HCC)   C. difficile colitis   Hypokalemia   C. difficile diarrhea   Chronic abdominal pain   Nausea vomiting and diarrhea   Anxiety  1 C. difficile infection -Patient presented with intractable nausea vomiting diarrhea abdominal pain. -Patient with history of Crohn's disease, recent C. difficile infection and treated outpatient  hospital with vancomycin. -CT abdomen and pelvis with no acute findings. -C. difficile toxin negative, positive C. Difficile. -Normal CRP/ESR. -Patient transition to Dificid. -Patient with clinical improvement. -Diet advanced to a soft diet per GI. -Supportive care.  2.  Intractable nausea/vomiting/diarrhea/abdominal pain -Likely secondary to C. difficile infection/#1. -CT abdomen and pelvis without any acute abnormalities. -Patient seen in consultation by GI, sed rate, ESR normal. -For GI no signs of an acute Crohn's flare. -Clinical improvement. -Continue treatment as a #1. -Outpatient follow-up with GI post discharge.  3.  Suspected AKI -Based on patient's reported oliguria/anuria for 48 hours. -Creatinine on presentation was 0.7. -Urinalysis bland. -Urine output not properly recorded. -Renal function improved with creatinine currently at 0.66.  4.  Hypokalemia -Secondary to GI losses. -Potassium repleted currently at 3.7.  Magnesium at 1.7. -We will give magnesium 4 g IV x1 to keep magnesium at approximately 2.  5.  History of Crohn's disease, -Patient with complicated history, not on immunosuppressants or steroids. -Per review of chart from care everywhere note from 01/29/2021 patient previously treated with Remicade (with antibody development and subsequent infusion reactions) and Humira/Cimzia (complicated by recurrent infections and steroid intolerance/adverse events). -Patient has not seen a GI doctor in [place] recently. -CRP/sed rate within normal limits. -Patient being followed by GI and will follow up with GI in the outpatient setting.  6.  Chronic pain secondary to Asherman's disease -Continue current pain regimen.  7.  Anxiety -Xanax.  8.  Recent COVID-19 infection -Was asymptomatic and tested + 03/24/2021.   DVT prophylaxis: SCDs Code Status: Full Family Communication: Updated patient.  No family at bedside. Disposition:   Status is: Inpatient  Dispo:  The patient is from: Home              Anticipated d/c is to: Home              Patient currently being treated for C. difficile, slowly clinically improving, diet being advanced.  Not stable for discharge.   Difficult to place patient no       Consultants:   Gastroenterology: Dr. Michail Sermon 04/09/2021  Procedures:   CT abdomen and pelvis 04/08/2021  Antimicrobials:   Dificid 04/09/2021>>>>> 04/19/2021  IV Flagyl 04/08/2021>>>> 04/09/2021     Subjective: Patient laying in bed.  Stated just tolerated a soft diet recently and is hoping he does not run right out of her.  Denies any chest pain.  No shortness of breath.  Stated last bowel movement was yesterday morning.  Abdominal pain improving.  Overall feeling better than she did on admission.  Objective: Vitals:   04/09/21 1501 04/09/21 2022 04/10/21 0544 04/10/21 1420  BP: 109/71 119/85 102/69 98/69  Pulse: 89 72 70 74  Resp: _0 Temp: 98 F (36.7 C) 98.1 F (36.7 C) 98 F (36.7 C) 98.2 F (36.8 C)  TempSrc: Oral Oral Oral Oral  SpO2: 100% 100% 100% 100%  Weight:      Height:        Intake/Output Summary (Last 24 hours) at 04/10/2021 1645 Last data filed at 04/10/2021 1600 Gross per 24 hour  Intake 100 ml  Output --  Net 100 ml   Filed Weights   04/08/21 0527  Weight: 41.7 kg    Examination:  General exam: Appears calm and comfortable  Respiratory system: Clear to auscultation. Respiratory effort normal. Cardiovascular system: S1 & S2 heard, RRR. No JVD, murmurs, rubs, gallops or clicks. No pedal edema. Gastrointestinal system: Abdomen is nondistended, soft and some tenderness to palpation in lower quadrants bilaterally.  Positive bowel sounds.  No rebound.  No guarding.   Central nervous system: Alert and oriented. No focal neurological deficits. Extremities: Symmetric 5 x 5 power. Skin: No rashes, lesions or ulcers Psychiatry: Judgement and insight appear normal. Mood & affect appropriate.      Data Reviewed: I have personally reviewed following labs and imaging studies  CBC: Recent Labs  Lab 04/08/21 0550 04/09/21 0443 04/10/21 0518  WBC 4.5 5.3 4.5  NEUTROABS 2.9  --   --   HGB 11.2* 10.5* 11.6*  HCT 34.9* 34.2* 37.8  MCV 86.4 88.6 89.4  PLT 406* 283 329    Basic Metabolic Panel: Recent Labs  Lab 04/08/21 0550 04/09/21 0443 04/10/21 0518  NA 136 133* 135  K 2.6* 3.3* 3.7  CL 103 104 102  CO2 23 21* 25  GLUCOSE 105* 58* 117*  BUN 9 5* <5*  CREATININE 0.70 0.53 0.66  CALCIUM 8.9 8.0* 8.8*  MG 1.8  --  1.7    GFR: Estimated Creatinine Clearance: 65.8 mL/min (by C-G formula based on SCr of 0.66 mg/dL).  Liver Function Tests: Recent Labs  Lab 04/08/21 0550  AST 19  ALT 11  ALKPHOS 50  BILITOT 0.6  PROT 7.2  ALBUMIN 3.7    CBG: No results for input(s): GLUCAP in the last 168 hours.   Recent Results (from the past 240 hour(s))  Gastrointestinal Panel by PCR , Stool     Status: None   Collection Time: 04/08/21  5:50 AM   Specimen: Stool  Result Value Ref Range Status   Campylobacter species NOT DETECTED  NOT DETECTED Final   Plesimonas shigelloides NOT DETECTED NOT DETECTED Final   Salmonella species NOT DETECTED NOT DETECTED Final   Yersinia enterocolitica NOT DETECTED NOT DETECTED Final   Vibrio species NOT DETECTED NOT DETECTED Final   Vibrio cholerae NOT DETECTED NOT DETECTED Final   Enteroaggregative E coli (EAEC) NOT DETECTED NOT DETECTED Final   Enteropathogenic E coli (EPEC) NOT DETECTED NOT DETECTED Final   Enterotoxigenic E coli (ETEC) NOT DETECTED NOT DETECTED Final   Shiga like toxin producing E coli (STEC) NOT DETECTED NOT DETECTED Final   Shigella/Enteroinvasive E coli (EIEC) NOT DETECTED NOT DETECTED Final   Cryptosporidium NOT DETECTED NOT DETECTED Final   Cyclospora cayetanensis NOT DETECTED NOT DETECTED Final   Entamoeba histolytica NOT DETECTED NOT DETECTED Final   Giardia lamblia NOT DETECTED NOT DETECTED Final    Adenovirus F40/41 NOT DETECTED NOT DETECTED Final   Astrovirus NOT DETECTED NOT DETECTED Final   Norovirus GI/GII NOT DETECTED NOT DETECTED Final   Rotavirus A NOT DETECTED NOT DETECTED Final   Sapovirus (I, II, IV, and V) NOT DETECTED NOT DETECTED Final    Comment: Performed at [facility], [address] [place], [place] [address]  C Difficile Quick Screen w PCR reflex     Status: Abnormal   Collection Time: 04/08/21  5:50 AM   Specimen: Stool  Result Value Ref Range Status   C Diff antigen POSITIVE (A) NEGATIVE Final   C Diff toxin NEGATIVE NEGATIVE Final   C Diff interpretation Results are indeterminate. See PCR results.  Final    Comment: Performed at [facility], [place] [address] [place], [place] [address]  C. Diff by PCR, Reflexed     Status: None   Collection Time: 04/08/21  5:50 AM  Result Value Ref Range Status   Toxigenic C. Difficile by PCR NEGATIVE NEGATIVE Final    Comment: Patient is colonized with non toxigenic C. difficile. May not need treatment unless significant symptoms are present. Performed at [facility], [place] [address], [place], [place] [address]   SARS CORONAVIRUS 2 (TAT 6-24 HRS) Nasopharyngeal Nasopharyngeal Swab     Status: None   Collection Time: 04/08/21 11:13 AM   Specimen: Nasopharyngeal Swab  Result Value Ref Range Status   SARS Coronavirus 2 NEGATIVE NEGATIVE Final    Comment: (NOTE) SARS-CoV-2 target nucleic acids are NOT DETECTED.  The SARS-CoV-2 RNA is generally detectable in upper and lower respiratory specimens during the acute phase of infection. Negative results do not preclude SARS-CoV-2 infection, do not rule out co-infections with other pathogens, and should not be used as the sole basis for treatment or other patient management decisions. Negative results must be combined with clinical observations, patient history, and epidemiological information. The expected result is Negative.  Fact Sheet for  Patients: SugarRoll.be  Fact Sheet for Healthcare Providers: https://www.woods-mathews.com/  This test is not yet approved or cleared by the [place] FDA and  has been authorized for detection and/or diagnosis of SARS-CoV-2 by FDA under an Emergency Use Authorization (EUA). This EUA will remain  in effect (meaning this test can be used) for the duration of the COVID-19 declaration under Se ction 564(b)(1) of the Act, 21 U.S.C. section 360bbb-3(b)(1), unless the authorization is terminated or revoked sooner.  Performed at [facility], [place] [address], [place], [place] [address]          Radiology Studies: CT ABDOMEN PELVIS W CONTRAST  Result Date: 04/08/2021 CLINICAL DATA:  Nausea, vomiting, abdominal pain, Crohn's disease, C diff EXAM: CT ABDOMEN AND PELVIS WITH CONTRAST TECHNIQUE: Multidetector CT imaging of the abdomen and pelvis was performed using the standard protocol following bolus administration of intravenous contrast. CONTRAST:  154m OMNIPAQUE IOHEXOL 300 MG/ML  SOLN COMPARISON:  None. FINDINGS: Lower chest: No acute abnormality.  Bilateral breast implants. Hepatobiliary: No solid liver abnormality is seen. No gallstones, gallbladder wall thickening, or biliary dilatation. Pancreas: Unremarkable. No pancreatic ductal dilatation or surrounding inflammatory changes. Spleen: Normal in size without significant abnormality. Adrenals/Urinary Tract: Adrenal glands are unremarkable. Kidneys are normal, without renal calculi, solid lesion, or hydronephrosis. Bladder is unremarkable. Stomach/Bowel: Stomach is within normal limits. Appendix appears normal. No evidence of bowel wall thickening, distention, or inflammatory changes. Vascular/Lymphatic: No significant vascular findings are present. No enlarged abdominal or pelvic lymph nodes. Reproductive: No mass or other significant abnormality. Ovarian cysts and follicles. Other: No  abdominal wall hernia or abnormality. No abdominopelvic ascites. Musculoskeletal: No acute or significant osseous findings. IMPRESSION: 1. No acute CT findings of the abdomen or pelvis to explain nausea, vomiting, or abdominal pain. 2.  No CT findings of Crohn's disease. 3.  No CT evidence of colitis. Electronically Signed   By: AEddie CandleM.D.   On: 04/08/2021 18:58        Scheduled Meds: . ALPRAZolam  1 mg Oral BID  . feeding supplement (KATE FARMS STANDARD 1.4)  325 mL Oral BID BM  . fidaxomicin  200 mg Oral BID  . FLUoxetine  60 mg Oral q morning  . gabapentin  200 mg Oral Daily  . levothyroxine  88 mcg Oral Q0600  . ondansetron (ZOFRAN) IV  4 mg Intravenous Q6H  . protein supplement  1 Scoop Oral TID WC  . sodium chloride flush  3 mL Intravenous Q12H   Continuous Infusions: . magnesium sulfate bolus IVPB 4 g (04/10/21 1451)     LOS: 1 day    Time spent: 40 minutes    DIrine Seal MD [facility]   To contact the attending provider between 7A-7P or the covering provider during after hours 7P-7A, please log into the web site [email or web address] and access using universal [place] password for that web site. If you do not have the password, please call the hospital operator.  04/10/2021, 4:45 PM

## 2021-04-10 NOTE — Progress Notes (Signed)
[facility] Gastroenterology Progress Note  Kimberly Valencia 35 y.o. [birth year]  CC: Diarrhea   Subjective: Patient reports improvement in her diarrhea and states she has not had a bowel movement since yesterday.  Denies any rectal bleeding or melena.  Reports continued lower abdominal discomfort, especially after eating; however, she is interested in advancing her diet  ROS : Review of Systems  Cardiovascular: Negative for chest pain and palpitations.  Gastrointestinal: Positive for abdominal pain. Negative for blood in stool, constipation, diarrhea, heartburn, melena, nausea and vomiting.    Objective: Vital signs in last 24 hours: Vitals:   04/09/21 2022 04/10/21 0544  BP: 119/85 102/69  Pulse: 72 70  Resp: 18 17  Temp: 98.1 F (36.7 C) 98 F (36.7 C)  SpO2: 100% 100%    Physical Exam:  General:  Alert, oriented, cooperative, no distress, thin  Head:  Normocephalic, without obvious abnormality, atraumatic  Eyes:  Anicteric sclera, EOMs intact  Lungs:   Clear to auscultation bilaterally, respirations unlabored  Heart:  Regular rate and rhythm, S1, S2 normal  Abdomen:   Soft and non-distended with mild to moderate lower abdominal tenderness to palpation, bowel sounds active all four quadrants  Extremities: Extremities normal, atraumatic, no  edema  Pulses: 2+ and symmetric    Lab Results: Recent Labs    04/08/21 0550 04/09/21 0443 04/10/21 0518  NA 136 133* 135  K 2.6* 3.3* 3.7  CL 103 104 102  CO2 23 21* 25  GLUCOSE 105* 58* 117*  BUN 9 5* <5*  CREATININE 0.70 0.53 0.66  CALCIUM 8.9 8.0* 8.8*  MG 1.8  --  1.7   Recent Labs    04/08/21 0550  AST 19  ALT 11  ALKPHOS 50  BILITOT 0.6  PROT 7.2  ALBUMIN 3.7   Recent Labs    04/08/21 0550 04/09/21 0443 04/10/21 0518  WBC 4.5 5.3 4.5  NEUTROABS 2.9  --   --   HGB 11.2* 10.5* 11.6*  HCT 34.9* 34.2* 37.8  MCV 86.4 88.6 89.4  PLT 406* 283 333   No results for input(s): LABPROT, INR in the last 72  hours.    Assessment: Diarrhea: Suspect ongoing C. difficile diarrhea.  Patient notes improvement on Dificid.   -Most recent colonoscopy 01/2021 did not show any evidence of active Crohn's disease. -CT abdomen/pelvis yesterday did not show evidence of active Crohn's disease or any bowel thickening. -Normal CRP/ESR  Plan: Continue Dificid BID for suspected C. Diff.  Advance to soft diet.  Continue supportive care.  As an outpatient, she will need H. Pylori stool Ag testing to confirm eradication.  [facility] will follow.     Salley Slaughter PA-C 04/10/2021, 12:38 PM  Contact #  [phone]

## 2021-04-11 LAB — BASIC METABOLIC PANEL
Anion gap: 8 (ref 5–15)
BUN: <5 mg/dL — ABNORMAL LOW (ref 6–20)
CO2: 28 mmol/L (ref 22–32)
Calcium: 9.4 mg/dL (ref 8.9–10.3)
Chloride: 104 mmol/L (ref 98–111)
Creatinine, Ser: 0.77 mg/dL (ref 0.44–1.00)
GFR, Estimated: >60 mL/min (ref >60)
Glucose, Bld: 86 mg/dL (ref 70–99)
Potassium: 4.2 mmol/L (ref 3.5–5.1)
Sodium: 140 mmol/L (ref 135–145)

## 2021-04-11 LAB — CBC
HCT: 41.6 % (ref 36.0–46.0)
Hemoglobin: 12.5 g/dL (ref 12.0–15.0)
MCH: 27.7 pg (ref 26.0–34.0)
MCHC: 30 g/dL (ref 30.0–36.0)
MCV: 92 fL (ref 80.0–100.0)
Platelets: 381 10*3/uL (ref 150–400)
RBC: 4.52 MIL/uL (ref 3.87–5.11)
RDW: 15.7 % — ABNORMAL HIGH (ref 11.5–15.5)
WBC: 6.4 10*3/uL (ref 4.0–10.5)
nRBC: 0 % (ref 0.0–0.2)

## 2021-04-11 LAB — MAGNESIUM: Magnesium: 2 mg/dL (ref 1.7–2.4)

## 2021-04-11 MED ORDER — HYDROMORPHONE HCL 1 MG/ML IJ SOLN
0.5000 mg | INTRAMUSCULAR | Status: DC | PRN
  Administered 2021-04-11 (×2): 0.5 mg via INTRAVENOUS
  Filled 2021-04-11 (×2): qty 0.5

## 2021-04-11 MED ORDER — ONDANSETRON 4 MG PO TBDP: 4.0000 mg | ORAL_TABLET | Freq: Three times a day (TID) | ORAL | 0 refills | Status: DC | PRN

## 2021-04-11 MED ORDER — FIDAXOMICIN 200 MG PO TABS: 200.0000 mg | ORAL_TABLET | Freq: Two times a day (BID) | ORAL | 0 refills | Status: DC

## 2021-04-11 NOTE — Progress Notes (Signed)
Discharge instructions given with stated understanding 

## 2021-04-11 NOTE — Plan of Care (Signed)

## 2021-04-11 NOTE — Progress Notes (Signed)
Kimberly Valencia 35 y.o. 1986-06-27  CC: Diarrhea  Subjective: Patient reports improvement in her diarrhea.  She had one liquid BM overnight.  Denies rectal bleeding or melena.  Reports continued lower abdominal discomfort, especially after eating; however, she is interested in advancing her diet  ROS : Review of Systems  Cardiovascular: Negative for chest pain and palpitations.  Gastrointestinal: Positive for abdominal pain. Negative for blood in stool, constipation, diarrhea, heartburn, melena, nausea and vomiting.    Objective: Vital signs in last 24 hours: Vitals:   04/10/21 2028 04/11/21 0534  BP: 112/78 106/65  Pulse: 84 83  Resp: 20 19  Temp: 98.2 F (36.8 C)   SpO2: 100% 100%    Physical Exam:  General:  Alert, oriented, cooperative, no distress, thin  Head:  Normocephalic, without obvious abnormality, atraumatic  Eyes:  Anicteric sclera, EOMs intact  Lungs:   Clear to auscultation bilaterally, respirations unlabored  Heart:  Regular rate and rhythm, S1, S2 normal  Abdomen:   Soft and non-distended with mild to moderate lower abdominal tenderness to palpation, bowel sounds active all four quadrants  Extremities: Extremities normal, atraumatic, no  edema  Pulses: 2+ and symmetric    Lab Results: Recent Labs    04/10/21 0518 04/11/21 0508  NA 135 140  K 3.7 4.2  CL 102 104  CO2 25 28  GLUCOSE 117* 86  BUN <5* <5*  CREATININE 0.66 0.77  CALCIUM 8.8* 9.4  MG 1.7 2.0   No results for input(s): AST, ALT, ALKPHOS, BILITOT, PROT, ALBUMIN in the last 72 hours. Recent Labs    04/10/21 0518 04/11/21 0508  WBC 4.5 6.4  HGB 11.6* 12.5  HCT 37.8 41.6  MCV 89.4 92.0  PLT 333 381   No results for input(s): LABPROT, INR in the last 72 hours.    Assessment: Diarrhea: Suspect ongoing C. difficile diarrhea.  Patient notes improvement on Dificid.   -Most recent colonoscopy 01/2021 did not show any evidence of active Crohn's  disease. -CT abdomen/pelvis 04/08/21 did not show evidence of active Crohn's disease or any bowel thickening. -Normal CRP/ESR  Acute on chronic abdominal pain, Asherman's syndrome, on chronic narcotic regimen with pain management  Plan: Continue Dificid BID for suspected C. Diff, recommend a total duration of 14 days.  Advance to regular diet.  Continue supportive care.  As an outpatient, she will need H. Pylori stool Ag testing to confirm eradication.  FU at GI clinic PRN.  [facility] will sign off.  Please contact us if we can be of any further assistance during this hospital stay.   Salley Slaughter PA-C 04/11/2021, 11:53 AM  Contact #  [phone]

## 2021-04-11 NOTE — Discharge Summary (Signed)
Physician Discharge Summary  Kimberly Valencia VFI:[id number] DOB: [birth year] DOA: 04/08/2021  PCP: Pcp, No  Admit date: 04/08/2021 Discharge date: 04/11/2021  Time spent: 55 minutes  Recommendations for Outpatient Follow-up:  1. Follow-up with Dr. Michail Sermon, gastroenterology in 3 to 4 weeks. 2. Patient to pick her PCP on follow-up with PCP in 2 weeks.  On follow-up patient will need a basic metabolic profile, magnesium level checked to follow-up on electrolytes and renal function.   Discharge Diagnoses:  Principal Problem:   Intractable nausea and vomiting Active Problems:   Chronic pain   Crohn disease (HCC)   C. difficile colitis   Hypokalemia   C. difficile diarrhea   Chronic abdominal pain   Nausea vomiting and diarrhea   Anxiety   Discharge Condition: Stable and improved.  Diet recommendation: Regular  Filed Weights   04/08/21 0527  Weight: 41.7 kg    History of present illness:  HPI per Dr. 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 difficile who presented to the ED with abdominal pain, nausea, vomiting, watery diarrhea for the past several days.  Patient states that 3 weeks ago she had a tonsillectomy and 1 week later she was hospitalized in [place] for pyelonephritis and found to have an incidental asymptomatic COVID-19 positive lab.  She also was thought to have a tonsillar abscess and so initially was on Augmentin followed by IV antibiotics for the pyelonephritis.  During her hospitalization she also tested positive for C. difficile and started on p.o. vancomycin.  She completed IV antibiotics in the hospital and was discharged on 10 days of p.o. vancomycin.  Since her discharge she has not felt any better and  has continued to have lower abdominal pain, nausea and vomiting and nonbloody diarrhea.  Symptoms worsened 3 days ago and has since been unable to tolerate any p.o. intake and has not tolerated any of her p.o. meds.  She also has not had any urine output over the past 2 days.  Admits to some diaphoresis and right back pain but otherwise no other symptoms.  Symptoms are not consistent with her Crohn's disease and she is not on any immunosuppressants for this.  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     ED Course: Afebrile, hemodynamically stable, on room air. Notable Labs: Sodium 136, K2.6, BUN 9, creatinine 0.7, WBC 4.5, Hb 11.2, platelets 406, C. difficile antigen positive, C. difficile toxin negative, C. difficile PCR negative. Patient received Benadryl, Dilaudid, Zofran, KCl IV (did not take the p.o. KCl), 3 L NS bolus.   Hospital Course:  1 C. difficile infection -Patient presented with intractable nausea vomiting diarrhea abdominal pain. -Patient with history of Crohn's disease, recent C. difficile infection and treated outpatient hospital with vancomycin. -CT abdomen and pelvis with no acute findings. -C. difficile toxin negative, positive C. Difficile antigen. -Normal CRP/ESR. -Patient transitioned to Dificid. -Patient improved clinically. -Diet orders were asked and patient advanced to a regular diet which he tolerated. -Patient followed by GI throughout the hospitalization. -Outpatient follow-up with GI and PCP.  2.  Intractable nausea/vomiting/diarrhea/abdominal pain -Likely secondary to C. difficile infection/#1. -CT abdomen and pelvis without any acute abnormalities. -Patient seen in consultation by GI, sed rate,  ESR normal. -Per GI no signs of an acute Crohn's flare. -Patient improved clinically with treatment of C. difficile colitis. -Patient had no further nausea or vomiting or diarrhea. -Abdominal pain  improved. -Patient was started on clears and diet advanced to a regular diet which he tolerated. -Outpatient follow-up with PCP and GI.  3.  Suspected AKI -Based on patient's reported oliguria/anuria for 48 hours. -Creatinine on presentation was 0.7. -Urinalysis bland. -Patient had good urine output as patient improved clinically.  -Renal function improved with creatinine was 0.77 by day of discharge.  Outpatient follow-up.   4.  Hypokalemia -Secondary to GI losses. -Potassium and magnesium repleted.   -Outpatient follow-up.    5.  History of Crohn's disease, -Patient with complicated history, not on immunosuppressants or steroids. -Per review of chart from care everywhere note from 01/29/2021 patient previously treated with Remicade (with antibody development and subsequent infusion reactions) and Humira/Cimzia (complicated by recurrent infections and steroid intolerance/adverse events). -Patient has not seen a GI doctor in [place] recently. -CRP/sed rate within normal limits. -Patient followed by GI during the hospitalization and will follow up with GI in the outpatient setting.  6.  Chronic pain secondary to Asherman's disease -Patient maintained on home regimen oxycodone as well as IV Dilaudid as needed.  -Outpatient follow-up with chronic pain clinic.   7.  Anxiety -Patient maintained on home regimen Xanax.  8.  Recent COVID-19 infection -Was asymptomatic and tested + 03/24/2021.  Procedures:  CT abdomen and pelvis 04/08/2021    Consultations:  Gastroenterology: Dr. Michail Sermon 04/09/2021   Discharge Exam: Vitals:   04/11/21 0534 04/11/21 1415  BP: 106/65 107/68  Pulse: 83 87  Resp: 19 16  Temp:  98 F (36.7 C)  SpO2: 100% 100%    General: NAD Cardiovascular: RRR Respiratory: CTAB Gi: Soft, nondistended, positive bowel sounds, some tenderness to palpation lower abdominal region.  No rebound.  No guarding.   Discharge Instructions   Discharge Instructions     Diet general   Complete by: As directed    Increase activity slowly   Complete by: As directed      Allergies as of 04/11/2021      Reactions   Bentyl [dicyclomine Hcl]    Compazine [prochlorperazine]    Reglan [metoclopramide]    Sulfa Antibiotics       Medication List    STOP taking these medications   vancomycin 125 MG capsule Commonly known as: VANCOCIN     TAKE these medications   ALPRAZolam 1 MG tablet Commonly known as: XANAX Take 1 mg by mouth 2 (two) times daily.   fidaxomicin 200 MG Tabs tablet Commonly known as: DIFICID Take 1 tablet (200 mg total) by mouth 2 (two) times daily for 12 days.   fluconazole 150 MG tablet Commonly known as: DIFLUCAN Take 150 mg by mouth once.   FLUoxetine 20 MG capsule Commonly known as: PROZAC Take 60 mg by mouth every morning.   gabapentin 100 MG capsule Commonly known as: NEURONTIN Take 200 mg by mouth daily.   levothyroxine 88 MCG tablet Commonly known as: SYNTHROID Take 88 mcg by mouth daily before breakfast.   ondansetron 4 MG disintegrating tablet Commonly known as: Zofran ODT Take 1 tablet (4 mg total) by mouth every 8 (eight) hours as needed for nausea or vomiting.   Oxycodone HCl 10 MG Tabs Take 10 mg by mouth every 4 (four) hours as needed for pain.   zolpidem 10 MG tablet Commonly known as: AMBIEN Take  10 mg by mouth at bedtime.      Allergies  Allergen Reactions  . Bentyl [Dicyclomine Hcl]   . Compazine [Prochlorperazine]   . Reglan [Metoclopramide]   . Sulfa Antibiotics     Follow-up Information    Possible providers/pcp. Schedule an appointment as soon as possible for a visit in 2 week(s).   Contact information: Development worker, community, Financial controller, Multimedia programmer, Radio producer.  I googled pain management clinicals and counted 20 in the local area.       Wilford Corner, MD. Schedule an appointment as soon as possible for a visit in 3 week(s).   Specialty:  Gastroenterology Why: Follow-up in 3 to 4 weeks. Contact information: [address] [address] [phone]                The results of significant diagnostics from this hospitalization (including imaging, microbiology, ancillary and laboratory) are listed below for reference.    Significant Diagnostic Studies: CT ABDOMEN PELVIS W CONTRAST  Result Date: 04/08/2021 CLINICAL DATA:  Nausea, vomiting, abdominal pain, Crohn's disease, C diff EXAM: CT ABDOMEN AND PELVIS WITH CONTRAST TECHNIQUE: Multidetector CT imaging of the abdomen and pelvis was performed using the standard protocol following bolus administration of intravenous contrast. CONTRAST:  170m OMNIPAQUE IOHEXOL 300 MG/ML  SOLN COMPARISON:  None. FINDINGS: Lower chest: No acute abnormality.  Bilateral breast implants. Hepatobiliary: No solid liver abnormality is seen. No gallstones, gallbladder wall thickening, or biliary dilatation. Pancreas: Unremarkable. No pancreatic ductal dilatation or surrounding inflammatory changes. Spleen: Normal in size without significant abnormality. Adrenals/Urinary Tract: Adrenal glands are unremarkable. Kidneys are normal, without renal calculi, solid lesion, or hydronephrosis. Bladder is unremarkable. Stomach/Bowel: Stomach is within normal limits. Appendix appears normal. No evidence of bowel wall thickening, distention, or inflammatory changes. Vascular/Lymphatic: No significant vascular findings are present. No enlarged abdominal or pelvic lymph nodes. Reproductive: No mass or other significant abnormality. Ovarian cysts and follicles. Other: No abdominal wall hernia or abnormality. No abdominopelvic ascites. Musculoskeletal: No acute or significant osseous findings. IMPRESSION: 1. No acute CT findings of the abdomen or pelvis to explain nausea, vomiting, or abdominal pain. 2.  No CT findings of Crohn's disease. 3.  No CT evidence of colitis. Electronically Signed   By: AEddie CandleM.D.   On: 04/08/2021 18:58    Microbiology: Recent Results (from the past 240 hour(s))  Gastrointestinal Panel by PCR , Stool     Status: None   Collection Time: 04/08/21  5:50 AM   Specimen: Stool  Result Value Ref Range Status   Campylobacter species NOT DETECTED NOT DETECTED Final   Plesimonas shigelloides NOT DETECTED NOT DETECTED Final   Salmonella species NOT DETECTED NOT DETECTED Final   Yersinia enterocolitica NOT DETECTED NOT DETECTED Final   Vibrio species NOT DETECTED NOT DETECTED Final   Vibrio cholerae NOT DETECTED NOT DETECTED Final   Enteroaggregative E coli (EAEC) NOT DETECTED NOT DETECTED Final   Enteropathogenic E coli (EPEC) NOT DETECTED NOT DETECTED Final   Enterotoxigenic E coli (ETEC) NOT DETECTED NOT DETECTED Final   Shiga like toxin producing E coli (STEC) NOT DETECTED NOT DETECTED Final   Shigella/Enteroinvasive E coli (EIEC) NOT DETECTED NOT DETECTED Final   Cryptosporidium NOT DETECTED NOT DETECTED Final   Cyclospora cayetanensis NOT DETECTED NOT DETECTED Final   Entamoeba histolytica NOT DETECTED NOT DETECTED Final   Giardia lamblia NOT DETECTED NOT DETECTED Final   Adenovirus F40/41 NOT DETECTED NOT DETECTED Final  Astrovirus NOT DETECTED NOT DETECTED Final   Norovirus GI/GII NOT DETECTED NOT DETECTED Final   Rotavirus A NOT DETECTED NOT DETECTED Final   Sapovirus (I, II, IV, and V) NOT DETECTED NOT DETECTED Final    Comment: Performed at [facility], [place], [place] [address]  C Difficile Quick Screen w PCR reflex     Status: Abnormal   Collection Time: 04/08/21  5:50 AM   Specimen: Stool  Result Value Ref Range Status   C Diff antigen POSITIVE (A) NEGATIVE Final   C Diff toxin NEGATIVE NEGATIVE Final   C Diff interpretation Results are indeterminate. See PCR results.  Final    Comment: Performed at [facility], [place] [address], [place], [place] [address]  C. Diff by PCR, Reflexed     Status:  None   Collection Time: 04/08/21  5:50 AM  Result Value Ref Range Status   Toxigenic C. Difficile by PCR NEGATIVE NEGATIVE Final    Comment: Patient is colonized with non toxigenic C. difficile. May not need treatment unless significant symptoms are present. Performed at [facility], [place] [address], [place], [place] [address]   SARS CORONAVIRUS 2 (TAT 6-24 HRS) Nasopharyngeal Nasopharyngeal Swab     Status: None   Collection Time: 04/08/21 11:13 AM   Specimen: Nasopharyngeal Swab  Result Value Ref Range Status   SARS Coronavirus 2 NEGATIVE NEGATIVE Final    Comment: (NOTE) SARS-CoV-2 target nucleic acids are NOT DETECTED.  The SARS-CoV-2 RNA is generally detectable in upper and lower respiratory specimens during the acute phase of infection. Negative results do not preclude SARS-CoV-2 infection, do not rule out co-infections with other pathogens, and should not be used as the sole basis for treatment or other patient management decisions. Negative results must be combined with clinical observations, patient history, and epidemiological information. The expected result is Negative.  Fact Sheet for Patients: SugarRoll.be  Fact Sheet for Healthcare Providers: https://www.woods-mathews.com/  This test is not yet approved or cleared by the [place] FDA and  has been authorized for detection and/or diagnosis of SARS-CoV-2 by FDA under an Emergency Use Authorization (EUA). This EUA will remain  in effect (meaning this test can be used) for the duration of the COVID-19 declaration under Se ction 564(b)(1) of the Act, 21 U.S.C. section 360bbb-3(b)(1), unless the authorization is terminated or revoked sooner.  Performed at [facility], [place] [address] [place], [place] [address]      Labs: Basic Metabolic Panel: Recent Labs  Lab 04/08/21 0550 04/09/21 0443 04/10/21 0518 04/11/21 0508  NA 136 133* 135 140  K 2.6* 3.3*  3.7 4.2  CL 103 104 102 104  CO2 23 21* 25 28  GLUCOSE 105* 58* 117* 86  BUN 9 5* <5* <5*  CREATININE 0.70 0.53 0.66 0.77  CALCIUM 8.9 8.0* 8.8* 9.4  MG 1.8  --  1.7 2.0   Liver Function Tests: Recent Labs  Lab 04/08/21 0550  AST 19  ALT 11  ALKPHOS 50  BILITOT 0.6  PROT 7.2  ALBUMIN 3.7   Recent Labs  Lab 04/08/21 0550  LIPASE 33   No results for input(s): AMMONIA in the last 168 hours. CBC: Recent Labs  Lab 04/08/21 0550 04/09/21 0443 04/10/21 0518 04/11/21 0508  WBC 4.5 5.3 4.5 6.4  NEUTROABS 2.9  --   --   --   HGB 11.2* 10.5* 11.6* 12.5  HCT 34.9* 34.2* 37.8 41.6  MCV  86.4 88.6 89.4 92.0  PLT 406* 283 333 381   Cardiac Enzymes: No results for input(s): CKTOTAL, CKMB, CKMBINDEX, TROPONINI in the last 168 hours. BNP: BNP (last 3 results) No results for input(s): BNP in the last 8760 hours.  ProBNP (last 3 results) No results for input(s): PROBNP in the last 8760 hours.  CBG: No results for input(s): GLUCAP in the last 168 hours.     Signed:  Irine Seal MD.  [facility] 04/11/2021, 3:21 PM

## 2021-04-11 NOTE — Discharge Instructions (Signed)

## 2021-04-17 ENCOUNTER — Inpatient Hospital Stay (HOSPITAL_COMMUNITY)
Admission: EM | Admit: 2021-04-17 | Discharge: 2021-04-21 | DRG: 371 | Disposition: A | Discharge status: Home / Self Care | Payer: 59 | Attending: Internal Medicine | Admitting: Internal Medicine

## 2021-04-17 ENCOUNTER — Other Ambulatory Visit: Payer: Self-pay

## 2021-04-17 ENCOUNTER — Encounter (HOSPITAL_COMMUNITY): Payer: Self-pay | Admitting: Emergency Medicine

## 2021-04-17 ENCOUNTER — Emergency Department (HOSPITAL_COMMUNITY): Payer: 59

## 2021-04-17 DIAGNOSIS — G894 Chronic pain syndrome: Secondary | ICD-10-CM | POA: Diagnosis present

## 2021-04-17 DIAGNOSIS — E43 Unspecified severe protein-calorie malnutrition: Secondary | ICD-10-CM | POA: Diagnosis present

## 2021-04-17 DIAGNOSIS — Z681 Body mass index (BMI) 19 or less, adult: Secondary | ICD-10-CM | POA: Diagnosis not present

## 2021-04-17 DIAGNOSIS — E039 Hypothyroidism, unspecified: Secondary | ICD-10-CM | POA: Diagnosis present

## 2021-04-17 DIAGNOSIS — K5090 Crohn's disease, unspecified, without complications: Secondary | ICD-10-CM | POA: Diagnosis present

## 2021-04-17 DIAGNOSIS — A0472 Enterocolitis due to Clostridium difficile, not specified as recurrent: Secondary | ICD-10-CM | POA: Diagnosis present

## 2021-04-17 DIAGNOSIS — R112 Nausea with vomiting, unspecified: Secondary | ICD-10-CM

## 2021-04-17 DIAGNOSIS — N856 Intrauterine synechiae: Secondary | ICD-10-CM | POA: Diagnosis present

## 2021-04-17 DIAGNOSIS — Z20822 Contact with and (suspected) exposure to covid-19: Secondary | ICD-10-CM | POA: Diagnosis present

## 2021-04-17 DIAGNOSIS — E860 Dehydration: Secondary | ICD-10-CM | POA: Diagnosis present

## 2021-04-17 DIAGNOSIS — Z79890 Hormone replacement therapy (postmenopausal): Secondary | ICD-10-CM | POA: Diagnosis not present

## 2021-04-17 DIAGNOSIS — Z79899 Other long term (current) drug therapy: Secondary | ICD-10-CM

## 2021-04-17 DIAGNOSIS — F419 Anxiety disorder, unspecified: Secondary | ICD-10-CM | POA: Diagnosis present

## 2021-04-17 DIAGNOSIS — R109 Unspecified abdominal pain: Secondary | ICD-10-CM

## 2021-04-17 DIAGNOSIS — E876 Hypokalemia: Secondary | ICD-10-CM | POA: Diagnosis present

## 2021-04-17 DIAGNOSIS — G8929 Other chronic pain: Secondary | ICD-10-CM

## 2021-04-17 LAB — RESP PANEL BY RT-PCR (FLU A&B, COVID) ARPGX2
Influenza A by PCR: NEGATIVE
Influenza B by PCR: NEGATIVE
SARS Coronavirus 2 by RT PCR: NEGATIVE

## 2021-04-17 LAB — CBC
HCT: 37.6 % (ref 36.0–46.0)
Hemoglobin: 12.1 g/dL (ref 12.0–15.0)
MCH: 27.7 pg (ref 26.0–34.0)
MCHC: 32.2 g/dL (ref 30.0–36.0)
MCV: 86 fL (ref 80.0–100.0)
Platelets: 358 10*3/uL (ref 150–400)
RBC: 4.37 MIL/uL (ref 3.87–5.11)
RDW: 16.2 % — ABNORMAL HIGH (ref 11.5–15.5)
WBC: 7.8 10*3/uL (ref 4.0–10.5)
nRBC: 0 % (ref 0.0–0.2)

## 2021-04-17 LAB — COMPREHENSIVE METABOLIC PANEL
ALT: 11 U/L (ref 0–44)
AST: 19 U/L (ref 15–41)
Albumin: 3.8 g/dL (ref 3.5–5.0)
Alkaline Phosphatase: 48 U/L (ref 38–126)
Anion gap: 11 (ref 5–15)
BUN: 13 mg/dL (ref 6–20)
CO2: 20 mmol/L — ABNORMAL LOW (ref 22–32)
Calcium: 8.8 mg/dL — ABNORMAL LOW (ref 8.9–10.3)
Chloride: 108 mmol/L (ref 98–111)
Creatinine, Ser: 0.71 mg/dL (ref 0.44–1.00)
GFR, Estimated: >60 mL/min (ref >60)
Glucose, Bld: 100 mg/dL — ABNORMAL HIGH (ref 70–99)
Potassium: 3.8 mmol/L (ref 3.5–5.1)
Sodium: 139 mmol/L (ref 135–145)
Total Bilirubin: 0.8 mg/dL (ref 0.3–1.2)
Total Protein: 7.2 g/dL (ref 6.5–8.1)

## 2021-04-17 LAB — URINALYSIS, ROUTINE W REFLEX MICROSCOPIC
Bilirubin Urine: NEGATIVE
Glucose, UA: NEGATIVE mg/dL
Hgb urine dipstick: NEGATIVE
Ketones, ur: 80 mg/dL — AB
Leukocytes,Ua: NEGATIVE
Nitrite: NEGATIVE
Protein, ur: NEGATIVE mg/dL
Specific Gravity, Urine: 1.02 (ref 1.005–1.030)
pH: 6 (ref 5.0–8.0)

## 2021-04-17 LAB — MAGNESIUM: Magnesium: 1.6 mg/dL — ABNORMAL LOW (ref 1.7–2.4)

## 2021-04-17 LAB — HCG, QUANTITATIVE, PREGNANCY: hCG, Beta Chain, Quant, S: <1 m[IU]/mL (ref <5)

## 2021-04-17 LAB — LACTIC ACID, PLASMA
Lactic Acid, Venous: 2.9 mmol/L (ref 0.5–1.9)
Lactic Acid, Venous: 1.9 mmol/L (ref 0.5–1.9)

## 2021-04-17 LAB — LIPASE, BLOOD: Lipase: 21 U/L (ref 11–51)

## 2021-04-17 MED ORDER — HYDROMORPHONE HCL 1 MG/ML IJ SOLN
0.5000 mg | Freq: Once | INTRAMUSCULAR | Status: AC
  Administered 2021-04-17: 0.5 mg via INTRAVENOUS
  Filled 2021-04-17: qty 1

## 2021-04-17 MED ORDER — SODIUM CHLORIDE 0.9 % IV BOLUS
1000.0000 mL | Freq: Once | INTRAVENOUS | Status: AC
  Administered 2021-04-17: 1000 mL via INTRAVENOUS

## 2021-04-17 MED ORDER — HYDROMORPHONE HCL 1 MG/ML IJ SOLN
0.5000 mg | Freq: Once | INTRAMUSCULAR | Status: AC
Start: 2021-04-17 — End: 2021-04-17
  Administered 2021-04-17: 0.5 mg via INTRAVENOUS
  Filled 2021-04-17: qty 1

## 2021-04-17 MED ORDER — HYDROMORPHONE HCL 1 MG/ML IJ SOLN
1.0000 mg | Freq: Once | INTRAMUSCULAR | Status: AC
  Administered 2021-04-17: 1 mg via INTRAVENOUS
  Filled 2021-04-17: qty 1

## 2021-04-17 MED ORDER — DIPHENHYDRAMINE HCL 50 MG/ML IJ SOLN
12.5000 mg | Freq: Once | INTRAMUSCULAR | Status: AC
  Administered 2021-04-18: 12.5 mg via INTRAVENOUS
  Filled 2021-04-17: qty 1

## 2021-04-17 MED ORDER — ONDANSETRON HCL 4 MG/2ML IJ SOLN
4.0000 mg | Freq: Once | INTRAMUSCULAR | Status: AC
  Administered 2021-04-17: 4 mg via INTRAVENOUS
  Filled 2021-04-17: qty 2

## 2021-04-17 MED ORDER — LACTATED RINGERS IV BOLUS
1000.0000 mL | Freq: Once | INTRAVENOUS | Status: AC
  Administered 2021-04-17: 1000 mL via INTRAVENOUS

## 2021-04-17 MED ORDER — LORAZEPAM 2 MG/ML IJ SOLN
INTRAMUSCULAR | Status: AC
  Administered 2021-04-17: 2 mg
  Filled 2021-04-17: qty 1

## 2021-04-17 NOTE — ED Notes (Signed)
Lactic Acid 2.9 [place], [place]

## 2021-04-17 NOTE — H&P (Signed)
History and Physical   Ambar Raphael TJQ:[id number] DOB: 14-Jul-1986 DOA: 04/17/2021  Referring MD/NP/PA: Dr. Clarice Pole  PCP: Pcp, No   Outpatient Specialists: Dr. Joana Reamer, gastroenterology  Patient coming from: Home  Chief Complaint: Intractable nausea vomiting  HPI: Kimberly Valencia is a 35 y.o. female with medical history significant of Crohn's disease, Asherman syndrome, recent hospitalization with C. difficile colitis, chronic abdominal pain, anxiety disorder who was discharged 5 days ago on Dificid for her C. difficile colitis.  Patient returned today with intractable nausea vomiting and significant abdominal pain.  She has been unable to keep anything down apparently.  She is still having about 12 episodes of watery stools a day.  Patient denied any hematemesis no melena no bright red blood per rectum.  Patient is seen evaluated and appears to have ongoing symptoms.  Patient is being admitted to the hospital due to intractable nausea vomiting Inability to keep anything down..  ED Course: Temperature 98.3 blood pressure 120/79 pulse 128 respirate 40 oxygen sat 96% on room air.  CBC entirely within normal.  Chemistry showed CO2 20 but creatinine 0.71 calcium 8.9 glucose 100.  Lactic acid 1.9.  Plain abdominal x-ray showed no acute findings.  Patient is being readmitted with intractable nausea vomiting  Review of Systems: As per HPI otherwise 10 point review of systems negative.    Past Medical History:  Diagnosis Date  . Asherman syndrome     Past Surgical History:  Procedure Laterality Date  . TONSILLECTOMY       reports that she has never smoked. She has never used smokeless tobacco. She reports that she does not drink alcohol and does not use drugs.  Allergies  Allergen Reactions  . Trazodone     Other reaction(s): Other  . Bentyl [Dicyclomine Hcl]   . Compazine [Prochlorperazine]   . Reglan [Metoclopramide]   . Sulfa Antibiotics     No family history on  file.   Prior to Admission medications   Medication Sig Start Date End Date Taking? Authorizing Provider  ALPRAZolam Prudy Feeler) 1 MG tablet Take 1 mg by mouth 2 (two) times daily. 03/31/21  Yes [provider]  clonazePAM (KLONOPIN) 0.5 MG tablet Take 0.5 mg by mouth daily.   Yes [provider]  fidaxomicin (DIFICID) 200 MG TABS tablet Take 1 tablet (200 mg total) by mouth 2 (two) times daily for 12 days. 04/11/21 04/23/21 Yes Rodolph Bong, MD  FLUoxetine (PROZAC) 20 MG capsule Take 60 mg by mouth every morning. 03/30/21  Yes [provider]  gabapentin (NEURONTIN) 100 MG capsule Take 200 mg by mouth daily. 03/15/21  Yes [provider]  levothyroxine (SYNTHROID) 88 MCG tablet Take 88 mcg by mouth daily before breakfast.   Yes [provider]  ondansetron (ZOFRAN ODT) 4 MG disintegrating tablet Take 1 tablet (4 mg total) by mouth every 8 (eight) hours as needed for nausea or vomiting. 04/11/21  Yes Rodolph Bong, MD  Oxycodone HCl 10 MG TABS Take 10 mg by mouth every 4 (four) hours as needed for pain. 04/01/21  Yes [provider]  zolpidem (AMBIEN) 10 MG tablet Take 10 mg by mouth at bedtime. 03/20/21  Yes [provider]    Physical Exam: Vitals:   04/17/21 2200 04/17/21 2202 04/17/21 2215 04/17/21 2230  BP: 126/77 126/77    Pulse: (!) 112 (!) 108 (!) 104 100  Resp: 17 18 (!) 31 15  Temp:      TempSrc:  SpO2: 99% 99% 97% 96%  Weight:      Height:          Constitutional: Acutely ill looking, looking miserable Vitals:   04/17/21 2200 04/17/21 2202 04/17/21 2215 04/17/21 2230  BP: 126/77 126/77    Pulse: (!) 112 (!) 108 (!) 104 100  Resp: 17 18 (!) 31 15  Temp:      TempSrc:      SpO2: 99% 99% 97% 96%  Weight:      Height:       Eyes: PERRL, lids and conjunctivae normal ENMT: Mucous membranes are dry. Posterior pharynx clear of any exudate or lesions.Normal dentition.  Neck: normal, supple, no masses, no  thyromegaly Respiratory: clear to auscultation bilaterally, no wheezing, no crackles. Normal respiratory effort. No accessory muscle use.  Cardiovascular: Sinus tachycardia, no murmurs / rubs / gallops. No extremity edema. 2+ pedal pulses. No carotid bruits.  Abdomen: Mild diffuse tenderness, no masses palpated. No hepatosplenomegaly. Bowel sounds positive.  Musculoskeletal: no clubbing / cyanosis. No joint deformity upper and lower extremities. Good ROM, no contractures. Normal muscle tone.  Skin: no rashes, lesions, ulcers. No induration Neurologic: CN 2-12 grossly intact. Sensation intact, DTR normal. Strength 5/5 in all 4.  Psychiatric: Normal judgment and insight. Alert and oriented x 3. Normal mood.     Labs on Admission: I have personally reviewed following labs and imaging studies  CBC: Recent Labs  Lab 04/11/21 0508 04/17/21 1820  WBC 6.4 7.8  HGB 12.5 12.1  HCT 41.6 37.6  MCV 92.0 86.0  PLT 381 358   Basic Metabolic Panel: Recent Labs  Lab 04/11/21 0508 04/17/21 1948  NA 140 139  K 4.2 3.8  CL 104 108  CO2 28 20*  GLUCOSE 86 100*  BUN <5* 13  CREATININE 0.77 0.71  CALCIUM 9.4 8.8*  MG 2.0 1.6*   GFR: Estimated Creatinine Clearance: 66.6 mL/min (by C-G formula based on SCr of 0.71 mg/dL). Liver Function Tests: Recent Labs  Lab 04/17/21 1948  AST 19  ALT 11  ALKPHOS 48  BILITOT 0.8  PROT 7.2  ALBUMIN 3.8   Recent Labs  Lab 04/17/21 1948  LIPASE 21   No results for input(s): AMMONIA in the last 168 hours. Coagulation Profile: No results for input(s): INR, PROTIME in the last 168 hours. Cardiac Enzymes: No results for input(s): CKTOTAL, CKMB, CKMBINDEX, TROPONINI in the last 168 hours. BNP (last 3 results) No results for input(s): PROBNP in the last 8760 hours. HbA1C: No results for input(s): HGBA1C in the last 72 hours. CBG: No results for input(s): GLUCAP in the last 168 hours. Lipid Profile: No results for input(s): CHOL, HDL, LDLCALC,  TRIG, CHOLHDL, LDLDIRECT in the last 72 hours. Thyroid Function Tests: No results for input(s): TSH, T4TOTAL, FREET4, T3FREE, THYROIDAB in the last 72 hours. Anemia Panel: No results for input(s): VITAMINB12, FOLATE, FERRITIN, TIBC, IRON, RETICCTPCT in the last 72 hours. Urine analysis:    Component Value Date/Time   COLORURINE YELLOW 04/17/2021 1905   APPEARANCEUR CLEAR 04/17/2021 1905   LABSPEC 1.020 04/17/2021 1905   PHURINE 6.0 04/17/2021 1905   GLUCOSEU NEGATIVE 04/17/2021 1905   HGBUR NEGATIVE 04/17/2021 1905   BILIRUBINUR NEGATIVE 04/17/2021 1905   KETONESUR 80 (A) 04/17/2021 1905   PROTEINUR NEGATIVE 04/17/2021 1905   NITRITE NEGATIVE 04/17/2021 1905   LEUKOCYTESUR NEGATIVE 04/17/2021 1905   Sepsis Labs: @LABRCNTIP (procalcitonin:4,lacticidven:4) ) Recent Results (from the past 240 hour(s))  Gastrointestinal Panel by PCR , Stool  Status: None   Collection Time: 04/08/21  5:50 AM   Specimen: Stool  Result Value Ref Range Status   Campylobacter species NOT DETECTED NOT DETECTED Final   Plesimonas shigelloides NOT DETECTED NOT DETECTED Final   Salmonella species NOT DETECTED NOT DETECTED Final   Yersinia enterocolitica NOT DETECTED NOT DETECTED Final   Vibrio species NOT DETECTED NOT DETECTED Final   Vibrio cholerae NOT DETECTED NOT DETECTED Final   Enteroaggregative E coli (EAEC) NOT DETECTED NOT DETECTED Final   Enteropathogenic E coli (EPEC) NOT DETECTED NOT DETECTED Final   Enterotoxigenic E coli (ETEC) NOT DETECTED NOT DETECTED Final   Shiga like toxin producing E coli (STEC) NOT DETECTED NOT DETECTED Final   Shigella/Enteroinvasive E coli (EIEC) NOT DETECTED NOT DETECTED Final   Cryptosporidium NOT DETECTED NOT DETECTED Final   Cyclospora cayetanensis NOT DETECTED NOT DETECTED Final   Entamoeba histolytica NOT DETECTED NOT DETECTED Final   Giardia lamblia NOT DETECTED NOT DETECTED Final   Adenovirus F40/41 NOT DETECTED NOT DETECTED Final   Astrovirus NOT  DETECTED NOT DETECTED Final   Norovirus GI/GII NOT DETECTED NOT DETECTED Final   Rotavirus A NOT DETECTED NOT DETECTED Final   Sapovirus (I, II, IV, and V) NOT DETECTED NOT DETECTED Final    Comment: Performed at [facility], [address] [place], [place] [address]  C Difficile Quick Screen w PCR reflex     Status: Abnormal   Collection Time: 04/08/21  5:50 AM   Specimen: Stool  Result Value Ref Range Status   C Diff antigen POSITIVE (A) NEGATIVE Final   C Diff toxin NEGATIVE NEGATIVE Final   C Diff interpretation Results are indeterminate. See PCR results.  Final    Comment: Performed at [facility], [address] [place], [place] [address]  C. Diff by PCR, Reflexed     Status: None   Collection Time: 04/08/21  5:50 AM  Result Value Ref Range Status   Toxigenic C. Difficile by PCR NEGATIVE NEGATIVE Final    Comment: Patient is colonized with non toxigenic C. difficile. May not need treatment unless significant symptoms are present. Performed at [facility], [address], [place], [place] [address]   SARS CORONAVIRUS 2 (TAT 6-24 HRS) Nasopharyngeal Nasopharyngeal Swab     Status: None   Collection Time: 04/08/21 11:13 AM   Specimen: Nasopharyngeal Swab  Result Value Ref Range Status   SARS Coronavirus 2 NEGATIVE NEGATIVE Final    Comment: (NOTE) SARS-CoV-2 target nucleic acids are NOT DETECTED.  The SARS-CoV-2 RNA is generally detectable in upper and lower respiratory specimens during the acute phase of infection. Negative results do not preclude SARS-CoV-2 infection, do not rule out co-infections with other pathogens, and should not be used as the sole basis for treatment or other patient management decisions. Negative results must be combined with clinical observations, patient history, and epidemiological information. The expected result is Negative.  Fact Sheet for Patients: HairSlick.no  Fact Sheet  for Healthcare Providers: quierodirigir.com  This test is not yet approved or cleared by the [place] FDA and  has been authorized for detection and/or diagnosis of SARS-CoV-2 by FDA under an Emergency Use Authorization (EUA). This EUA will remain  in effect (meaning this test can be used) for the duration of the COVID-19 declaration under Se ction 564(b)(1) of the Act, 21 U.S.C. section 360bbb-3(b)(1), unless the authorization is terminated or revoked sooner.  Performed at [facility], [address] [place], [place] [address]      Radiological Exams on Admission: DG Abdomen Acute W/Chest  Result Date: 04/17/2021 CLINICAL DATA:  Abdominal pain EXAM: DG ABDOMEN ACUTE WITH 1 VIEW CHEST COMPARISON:  None. FINDINGS: There is no evidence of dilated bowel loops or free intraperitoneal air. No radiopaque calculi or other significant radiographic abnormality is seen. Heart size and mediastinal contours are within normal limits. Both lungs are clear. IMPRESSION: Negative abdominal radiographs.  No acute cardiopulmonary disease. Electronically Signed   By: Charlett NoseKevin  Dover M.D.   On: 04/17/2021 21:15      Assessment/Plan Principal Problem:   C. difficile colitis Active Problems:   Intractable nausea and vomiting   Crohn disease (HCC)   Chronic abdominal pain     #1 intractable nausea with vomiting: Probably ongoing symptoms of C. difficile colitis.  Patient will be admitted to the hospital.  IV fluids.  Clear liquid diet.  Symptomatic management.  Follow resolution.  May reconsult GI if needed.  #2 C. difficile colitis: Continue Dificid.  Consider ID and GI consultations.  #3 Crohn's disease: Appears to be in remission.  Continue monitoring  #4 dehydration: Continue hydration with D5 LR.  #5 chronic pain syndrome: Continue pain management   DVT prophylaxis: Lovenox Code Status: Full code Family Communication: No family at bedside Disposition  Plan: Home Consults called: None Admission status: Inpatient  Severity of Illness: The appropriate patient status for this patient is INPATIENT. Inpatient status is judged to be reasonable and necessary in order to provide the required intensity of service to ensure the patient's safety. The patient's presenting symptoms, physical exam findings, and initial radiographic and laboratory data in the context of their chronic comorbidities is felt to place them at high risk for further clinical deterioration. Furthermore, it is not anticipated that the patient will be medically stable for discharge from the hospital within 2 midnights of admission. The following factors support the patient status of inpatient.   " The patient's presenting symptoms include intractable nausea vomiting abdominal pain. " The worrisome physical exam findings include mild abdominal tenderness. " The initial radiographic and laboratory data are worrisome because of no acute findings on x-ray. " The chronic co-morbidities include Crohn's disease.   * I certify that at the point of admission it is my clinical judgment that the patient will require inpatient hospital care spanning beyond 2 midnights from the point of admission due to high intensity of service, high risk for further deterioration and high frequency of surveillance required.Lonia Blood*    Judianne Seiple,LAWAL MD [facility] Pager [phone]  If 7PM-7AM, please contact night-coverage [email or web address] Password [email or web address]  04/17/2021, 10:52 PM

## 2021-04-17 NOTE — ED Provider Notes (Signed)
[facility]-EMERGENCY DEPT Provider Note   CSN: [id number] Arrival date & time: 04/17/21  1626     History Emesis  Kimberly Valencia is a 35 y.o. female with past medical history significant for Crohn's, Asherman syndrome who presents for evaluation of abdominal pain and emesis.  She has history of chronic abdominal pain.  Was previously on chronic opiates for this.  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  She is not currently followed by GI.  Prior to moving here patient was hospitalized for pyelonephritis.  Was given IV antibiotics.  Subsequently during hospitalization was diagnosed with C. difficile colitis.  She was on vancomycin.  Was feeling well at discharge.  Upon moving here she had increase in diarrhea and emesis.  She was subsequently hospitalized.  Patient states she felt better for 1 or 2 days after hospitalization.  She began to have increase in watery bowel movements up to 10 times daily.  She denies any melena or bright red blood per rectum.  Patient states over the last 2 days she has been unable to keep down any liquids or solid food intake.  She is not able to take any of her home medications.  She has tried ODT Zofran without relief in her NBNB emesis.  She has cramping abdominal pain.  States she has not urinated in the last 24 hours which she feels is due to lack of any p.o. intake.  She rates her abdominal pain a 10/10.  She denies any prior history of bowel obstructions. She is passing flatulence.  States that this pain does not feel similar to her prior Crohn's flares.  She has not followed up with GI from her recent discharge.  She was previously on immunosuppressants however is no longer on this.  No fever, chills, chest pain, shortness of breath, lower extremity edema, hematuria.  Denies additional aggravating or  alleviating factors   Patient obtained from patient and past medical records. No interpreter  used  HPI     Past Medical History:  Diagnosis Date  . Asherman syndrome     Patient Active Problem List   Diagnosis Date Noted  . Chronic abdominal pain   . Nausea vomiting and diarrhea   . Anxiety   . C. difficile diarrhea 04/09/2021  . Intractable nausea and vomiting 04/08/2021  . Chronic pain 04/08/2021  . Crohn disease (HCC) 04/08/2021  . C. difficile colitis 04/08/2021  . AKI (acute kidney injury) (HCC) 04/08/2021  . Hypokalemia 04/08/2021    Past Surgical History:  Procedure Laterality Date  . TONSILLECTOMY       OB History   No obstetric history on file.     No family history on file.  Social History   Tobacco Use  . Smoking status: Never Smoker  . Smokeless tobacco: Never Used  Substance Use Topics  . Alcohol use: Never  . Drug use: Never    Home Medications Prior to Admission medications   Medication Sig Start Date End Date Taking? Authorizing Provider  ALPRAZolam Prudy Feeler) 1 MG tablet Take 1 mg by mouth 2 (two) times daily. 03/31/21  Yes [provider]  clonazePAM (KLONOPIN) 0.5 MG tablet Take 0.5 mg by mouth daily.   Yes [provider]  fidaxomicin (DIFICID) 200 MG TABS tablet Take 1 tablet (200 mg total) by mouth 2 (two) times daily for 12 days. 04/11/21 04/23/21 Yes Rodolph Bong, MD  FLUoxetine (PROZAC) 20 MG  capsule Take 60 mg by mouth every morning. 03/30/21  Yes [provider]  gabapentin (NEURONTIN) 100 MG capsule Take 200 mg by mouth daily. 03/15/21  Yes [provider]  levothyroxine (SYNTHROID) 88 MCG tablet Take 88 mcg by mouth daily before breakfast.   Yes [provider]  ondansetron (ZOFRAN ODT) 4 MG disintegrating tablet Take 1 tablet (4 mg total) by mouth every 8 (eight) hours as needed for nausea or vomiting. 04/11/21  Yes Rodolph Bong, MD  Oxycodone HCl 10 MG TABS Take 10 mg by mouth every 4 (four) hours as needed for pain. 04/01/21  Yes [provider]  zolpidem (AMBIEN) 10  MG tablet Take 10 mg by mouth at bedtime. 03/20/21  Yes [provider]    Allergies    Trazodone, Bentyl [dicyclomine hcl], Compazine [prochlorperazine], Reglan [metoclopramide], and Sulfa antibiotics  Review of Systems   Review of Systems  Constitutional: Negative.   HENT: Negative.   Respiratory: Negative.   Cardiovascular: Negative.   Gastrointestinal: Positive for abdominal pain, diarrhea, nausea and vomiting. Negative for abdominal distention, anal bleeding, blood in stool, constipation and rectal pain.  Genitourinary: Positive for decreased urine volume. Negative for hematuria.  Musculoskeletal: Negative.   Neurological: Negative.   All other systems reviewed and are negative.   Physical Exam Updated Vital Signs BP 126/77 (BP Location: Right Arm)   Pulse 100   Temp 98.3 F (36.8 C) (Oral)   Resp 15   Ht 5\' 1"  (1.549 m)   Wt 42.2 kg   LMP  (LMP Unknown) Comment: ashermans disease,neg preg test  SpO2 96%   BMI 17.57 kg/m   Physical Exam Vitals and nursing note reviewed.  Constitutional:      General: She is not in acute distress.    Appearance: She is well-developed. She is ill-appearing. She is not toxic-appearing or diaphoretic.     Comments: Thin, appears to not feel well  HENT:     Head: Normocephalic and atraumatic.     Nose: No congestion.     Mouth/Throat:     Mouth: Mucous membranes are dry.  Eyes:     Pupils: Pupils are equal, round, and reactive to light.  Cardiovascular:     Rate and Rhythm: Tachycardia present.     Pulses: Normal pulses.     Heart sounds: Normal heart sounds.  Pulmonary:     Effort: Pulmonary effort is normal. No respiratory distress.     Breath sounds: Normal breath sounds.     Comments: Clear to auscultation bilaterally.  Speaks in full sentences without difficulty Abdominal:     General: Bowel sounds are normal. There is no distension.     Palpations: Abdomen is soft.     Tenderness: There is abdominal tenderness.  There is no right CVA tenderness or left CVA tenderness.     Comments: Diffuse tenderness.  Normoactive bowel sound  Musculoskeletal:        General: Normal range of motion.     Cervical back: Normal range of motion.     Comments: No bony tenderness.  Moves all 4 extremities without difficulty  Skin:    General: Skin is warm and dry.     Capillary Refill: Capillary refill takes 2 to 3 seconds.     Comments: No rashes or lesions  Neurological:     General: No focal deficit present.     Mental Status: She is alert and oriented to person, place, and time.    ED  Results / Procedures / Treatments   Labs (all labs ordered are listed, but only abnormal results are displayed) Labs Reviewed  CBC - Abnormal; Notable for the following components:      Result Value   RDW 16.2 (*)    All other components within normal limits  URINALYSIS, ROUTINE W REFLEX MICROSCOPIC - Abnormal; Notable for the following components:   Ketones, ur 80 (*)    All other components within normal limits  LACTIC ACID, PLASMA - Abnormal; Notable for the following components:   Lactic Acid, Venous 2.9 (*)    All other components within normal limits  COMPREHENSIVE METABOLIC PANEL - Abnormal; Notable for the following components:   CO2 20 (*)    Glucose, Bld 100 (*)    Calcium 8.8 (*)    All other components within normal limits  MAGNESIUM - Abnormal; Notable for the following components:   Magnesium 1.6 (*)    All other components within normal limits  RESP PANEL BY RT-PCR (FLU A&B, COVID) [id number]  LACTIC ACID, PLASMA  HCG, QUANTITATIVE, PREGNANCY  LIPASE, BLOOD    EKG None  Radiology DG Abdomen Acute W/Chest  Result Date: 04/17/2021 CLINICAL DATA:  Abdominal pain EXAM: DG ABDOMEN ACUTE WITH 1 VIEW CHEST COMPARISON:  None. FINDINGS: There is no evidence of dilated bowel loops or free intraperitoneal air. No radiopaque calculi or other significant radiographic abnormality is seen. Heart size and mediastinal  contours are within normal limits. Both lungs are clear. IMPRESSION: Negative abdominal radiographs.  No acute cardiopulmonary disease. Electronically Signed   By: Charlett Nose M.D.   On: 04/17/2021 21:15    Procedures Procedures   Medications Ordered in ED Medications  diphenhydrAMINE (BENADRYL) injection 12.5 mg (has no administration in time range)  sodium chloride 0.9 % bolus 1,000 mL (0 mLs Intravenous Stopped 04/17/21 1944)  HYDROmorphone (DILAUDID) injection 0.5 mg (0.5 mg Intravenous Given 04/17/21 1828)  ondansetron (ZOFRAN) injection 4 mg (4 mg Intravenous Given 04/17/21 1826)  HYDROmorphone (DILAUDID) injection 1 mg (1 mg Intravenous Given 04/17/21 1924)  LORazepam (ATIVAN) 2 MG/ML injection (2 mg  Given 04/17/21 2015)  lactated ringers bolus 1,000 mL (0 mLs Intravenous Stopped 04/17/21 2154)  HYDROmorphone (DILAUDID) injection 0.5 mg (0.5 mg Intravenous Given 04/17/21 2206)  ondansetron (ZOFRAN) injection 4 mg (4 mg Intravenous Given 04/17/21 2205)    ED Course  I have reviewed the triage vital signs and the nursing notes.  Pertinent labs & imaging results that were available during my care of the patient were reviewed by me and considered in my medical decision making (see chart for details).  35 year old here for evaluation nausea, vomiting, diarrhea and abdominal pain. Complex medical history including recent C. difficile colitis diagnosis.  Afebrile, nonseptic appearing however does appear ill.  Recently discharged 1 week ago.  DC home on p.o. Dificid however patient has been unable to tolerate any p.o. intake over the last 48 hours despite home antiemetics as well aneuric per patient wo UA in 24 hours.  Multiple episodes diarrhea without blood >10 episodes daily.  Tachycardic in room.  No chest pain, shortness of breath.  Low suspicion for PE.  We will plan on labs, imaging and reassess  Labs and imaging personally reviewed and interpreted:  CBC without leukocytosis CMP CO2 20,  no additional electrolyte, renal or liver abnormality Lipase 21 Lactic 1.9 Preg negative Mag 1.6 UA with pyuria, no infectious process. EKG sinus tachycardia, no ischemic change Bladder scan 49 cc DG chest  and abdomen any acute abnormality COVID negative  Patient reassessed.  Still is retching in the room despite multiple antiemetics.  We will give additional fluids, antiemetic.  She does also appear anxious.  We will give some Ativan.  She has a generalized abdominal pain however has chronic abdominal pain.  Patient does not want CT scan at this time as she has had 2 abdominal CTs over the last month.  I feel this is reasonable.  Patient reassessed.  Has gotten 2 L of fluids.  Still dry heaving in the room.  She was able to provide urine sample.  Will give additional antiemetic.  Given patient has had multiple rounds of antiemetics, still persistent emesis in room.  She will need admission for intractable nausea and vomiting.  If patient's underlying C. difficile as cause of her emesis.  I have low suspicion for obstructive process.  Feel patient's likely abdominal pain is due to her chronic pain.   CONSULT with Dr. Mikeal Hawthorne with [facility] who agrees to evaluate patient for admission.   The patient appears reasonably stabilized for admission considering the current resources, flow, and capabilities available in the ED at this time, and I doubt any other [facility] requiring further screening and/or treatment in the ED prior to admission.     MDM Rules/Calculators/A&P                           Final Clinical Impression(s) / ED Diagnoses Final diagnoses:  Intractable vomiting with nausea, unspecified vomiting type  Chronic abdominal pain  C. difficile diarrhea    Rx / DC Orders ED Discharge Orders    None       Nathyn Luiz A, PA-C 04/17/21 2329    Arby Barrette, MD 04/30/21 1416

## 2021-04-17 NOTE — ED Triage Notes (Signed)
Patient was recently discharged w/ c-diff, states for the past 2 days she has not been able to keep down any PO abx, has been nauseated and having around 10 BM's per day, all diarrhea. Also complains of abd pain.

## 2021-04-17 NOTE — ED Notes (Signed)
Pt requesting benadryl or phenergan for her nausea. PA Britni notified.

## 2021-04-18 ENCOUNTER — Encounter (HOSPITAL_COMMUNITY): Payer: Self-pay | Admitting: Internal Medicine

## 2021-04-18 DIAGNOSIS — E43 Unspecified severe protein-calorie malnutrition: Secondary | ICD-10-CM | POA: Insufficient documentation

## 2021-04-18 LAB — COMPREHENSIVE METABOLIC PANEL
ALT: 11 U/L (ref 0–44)
AST: 18 U/L (ref 15–41)
Albumin: 3.5 g/dL (ref 3.5–5.0)
Alkaline Phosphatase: 43 U/L (ref 38–126)
Anion gap: 8 (ref 5–15)
BUN: 12 mg/dL (ref 6–20)
CO2: 24 mmol/L (ref 22–32)
Calcium: 9.2 mg/dL (ref 8.9–10.3)
Chloride: 108 mmol/L (ref 98–111)
Creatinine, Ser: 0.64 mg/dL (ref 0.44–1.00)
GFR, Estimated: >60 mL/min (ref >60)
Glucose, Bld: 104 mg/dL — ABNORMAL HIGH (ref 70–99)
Potassium: 3.3 mmol/L — ABNORMAL LOW (ref 3.5–5.1)
Sodium: 140 mmol/L (ref 135–145)
Total Bilirubin: 0.9 mg/dL (ref 0.3–1.2)
Total Protein: 6.8 g/dL (ref 6.5–8.1)

## 2021-04-18 LAB — CBC
HCT: 33 % — ABNORMAL LOW (ref 36.0–46.0)
Hemoglobin: 10.2 g/dL — ABNORMAL LOW (ref 12.0–15.0)
MCH: 27.6 pg (ref 26.0–34.0)
MCHC: 30.9 g/dL (ref 30.0–36.0)
MCV: 89.4 fL (ref 80.0–100.0)
Platelets: 376 10*3/uL (ref 150–400)
RBC: 3.69 MIL/uL — ABNORMAL LOW (ref 3.87–5.11)
RDW: 15.8 % — ABNORMAL HIGH (ref 11.5–15.5)
WBC: 7.6 10*3/uL (ref 4.0–10.5)
nRBC: 0 % (ref 0.0–0.2)

## 2021-04-18 LAB — BLOOD GAS, VENOUS
Acid-Base Excess: 2 mmol/L (ref 0.0–2.0)
Bicarbonate: 26.2 mmol/L (ref 20.0–28.0)
O2 Saturation: 61.7 %
Patient temperature: 98.6
pCO2, Ven: 41.7 mmHg — ABNORMAL LOW (ref 44.0–60.0)
pH, Ven: 7.415 (ref 7.250–7.430)
pO2, Ven: 31.9 mmHg — CL (ref 32.0–45.0)

## 2021-04-18 LAB — CREATININE, SERUM
Creatinine, Ser: 0.69 mg/dL (ref 0.44–1.00)
GFR, Estimated: >60 mL/min (ref >60)

## 2021-04-18 MED ORDER — SODIUM CHLORIDE 0.9 % IV SOLN
12.5000 mg | Freq: Four times a day (QID) | INTRAVENOUS | Status: DC | PRN
  Administered 2021-04-18 – 2021-04-21 (×7): 12.5 mg via INTRAVENOUS
  Filled 2021-04-18 (×5): qty 12.5
  Filled 2021-04-18: qty 0.5
  Filled 2021-04-18 (×2): qty 12.5

## 2021-04-18 MED ORDER — OXYCODONE HCL 5 MG PO TABS: 5.0000 mg | ORAL_TABLET | Freq: Four times a day (QID) | ORAL | Status: DC | PRN

## 2021-04-18 MED ORDER — DIPHENHYDRAMINE HCL 50 MG/ML IJ SOLN
12.5000 mg | Freq: Four times a day (QID) | INTRAMUSCULAR | Status: DC | PRN
  Administered 2021-04-18 – 2021-04-21 (×11): 12.5 mg via INTRAVENOUS
  Filled 2021-04-18 (×12): qty 1

## 2021-04-18 MED ORDER — POTASSIUM CHLORIDE CRYS ER 20 MEQ PO TBCR
40.0000 meq | EXTENDED_RELEASE_TABLET | Freq: Once | ORAL | Status: DC
  Filled 2021-04-18: qty 2

## 2021-04-18 MED ORDER — GABAPENTIN 100 MG PO CAPS
200.0000 mg | ORAL_CAPSULE | Freq: Every day | ORAL | Status: DC
  Administered 2021-04-18 – 2021-04-21 (×4): 200 mg via ORAL
  Filled 2021-04-18 (×4): qty 2

## 2021-04-18 MED ORDER — FIDAXOMICIN 200 MG PO TABS: 200.0000 mg | ORAL_TABLET | Freq: Two times a day (BID) | ORAL | Status: DC

## 2021-04-18 MED ORDER — ONDANSETRON HCL 4 MG/2ML IJ SOLN: 4.0000 mg | Freq: Once | INTRAMUSCULAR | Status: DC

## 2021-04-18 MED ORDER — FIDAXOMICIN 200 MG PO TABS
200.0000 mg | ORAL_TABLET | Freq: Two times a day (BID) | ORAL | Status: DC
  Administered 2021-04-18 – 2021-04-21 (×7): 200 mg via ORAL
  Filled 2021-04-18 (×8): qty 1

## 2021-04-18 MED ORDER — BOOST / RESOURCE BREEZE PO LIQD CUSTOM
1.0000 | Freq: Two times a day (BID) | ORAL | Status: DC
  Administered 2021-04-19 – 2021-04-21 (×2): 1 via ORAL

## 2021-04-18 MED ORDER — HYDROMORPHONE HCL 1 MG/ML IJ SOLN
0.5000 mg | Freq: Once | INTRAMUSCULAR | Status: AC
  Administered 2021-04-18: 0.5 mg via INTRAVENOUS
  Filled 2021-04-18: qty 0.5

## 2021-04-18 MED ORDER — ONDANSETRON HCL 4 MG PO TABS: 4.0000 mg | ORAL_TABLET | Freq: Four times a day (QID) | ORAL | Status: DC | PRN

## 2021-04-18 MED ORDER — HYDROMORPHONE HCL 1 MG/ML IJ SOLN: 1.0000 mg | Freq: Three times a day (TID) | INTRAMUSCULAR | Status: DC | PRN

## 2021-04-18 MED ORDER — ZOLPIDEM TARTRATE 5 MG PO TABS: 5.0000 mg | ORAL_TABLET | Freq: Every evening | ORAL | Status: DC | PRN

## 2021-04-18 MED ORDER — LEVOTHYROXINE SODIUM 88 MCG PO TABS
88.0000 ug | ORAL_TABLET | Freq: Every day | ORAL | Status: DC
  Administered 2021-04-18 – 2021-04-21 (×3): 88 ug via ORAL
  Filled 2021-04-18 (×4): qty 1

## 2021-04-18 MED ORDER — FLUOXETINE HCL 20 MG PO CAPS
60.0000 mg | ORAL_CAPSULE | Freq: Every morning | ORAL | Status: DC
  Administered 2021-04-19 – 2021-04-21 (×3): 60 mg via ORAL
  Filled 2021-04-18 (×3): qty 3

## 2021-04-18 MED ORDER — CLONAZEPAM 0.5 MG PO TABS
0.5000 mg | ORAL_TABLET | Freq: Every day | ORAL | Status: DC
  Administered 2021-04-19 – 2021-04-20 (×2): 0.5 mg via ORAL
  Filled 2021-04-18 (×2): qty 1

## 2021-04-18 MED ORDER — DIPHENHYDRAMINE HCL 50 MG/ML IJ SOLN
12.5000 mg | Freq: Three times a day (TID) | INTRAMUSCULAR | Status: DC | PRN
  Administered 2021-04-18: 12.5 mg via INTRAVENOUS
  Filled 2021-04-18: qty 1

## 2021-04-18 MED ORDER — ZOLPIDEM TARTRATE 5 MG PO TABS
5.0000 mg | ORAL_TABLET | Freq: Every day | ORAL | Status: DC
  Administered 2021-04-18: 5 mg via ORAL
  Filled 2021-04-18: qty 1

## 2021-04-18 MED ORDER — ALPRAZOLAM 1 MG PO TABS: 1.0000 mg | ORAL_TABLET | Freq: Once | ORAL | Status: DC

## 2021-04-18 MED ORDER — HYDROMORPHONE HCL 1 MG/ML IJ SOLN
1.0000 mg | INTRAMUSCULAR | Status: DC | PRN
  Administered 2021-04-18 – 2021-04-20 (×12): 1 mg via INTRAVENOUS
  Filled 2021-04-18 (×12): qty 1

## 2021-04-18 MED ORDER — DIPHENHYDRAMINE HCL 50 MG/ML IJ SOLN
12.5000 mg | Freq: Once | INTRAMUSCULAR | Status: AC
  Administered 2021-04-18: 12.5 mg via INTRAVENOUS
  Filled 2021-04-18: qty 1

## 2021-04-18 MED ORDER — OXYCODONE HCL 5 MG PO TABS
10.0000 mg | ORAL_TABLET | Freq: Four times a day (QID) | ORAL | Status: DC | PRN
Start: 2021-04-18 — End: 2021-04-18
  Administered 2021-04-18: 10 mg via ORAL
  Filled 2021-04-18: qty 2

## 2021-04-18 MED ORDER — DEXTROSE IN LACTATED RINGERS 5 % IV SOLN: INTRAVENOUS | Status: DC

## 2021-04-18 MED ORDER — HYDROMORPHONE HCL 1 MG/ML IJ SOLN
0.5000 mg | Freq: Three times a day (TID) | INTRAMUSCULAR | Status: DC | PRN
Start: 2021-04-18 — End: 2021-04-18
  Administered 2021-04-18: 0.5 mg via INTRAVENOUS
  Filled 2021-04-18: qty 0.5

## 2021-04-18 MED ORDER — ALPRAZOLAM 1 MG PO TABS
1.0000 mg | ORAL_TABLET | Freq: Two times a day (BID) | ORAL | Status: DC
  Administered 2021-04-18 – 2021-04-21 (×7): 1 mg via ORAL
  Filled 2021-04-18 (×7): qty 1

## 2021-04-18 MED ORDER — OXYCODONE HCL 5 MG PO TABS
10.0000 mg | ORAL_TABLET | ORAL | Status: DC | PRN
  Administered 2021-04-18 – 2021-04-21 (×12): 10 mg via ORAL
  Filled 2021-04-18 (×12): qty 2

## 2021-04-18 MED ORDER — PROSOURCE PLUS PO LIQD
30.0000 mL | Freq: Three times a day (TID) | ORAL | Status: DC
  Filled 2021-04-18 (×2): qty 30

## 2021-04-18 MED ORDER — ONDANSETRON HCL 4 MG/2ML IJ SOLN: 4.0000 mg | Freq: Four times a day (QID) | INTRAMUSCULAR | Status: DC | PRN

## 2021-04-18 MED ORDER — ADULT MULTIVITAMIN W/MINERALS CH
1.0000 | ORAL_TABLET | Freq: Every day | ORAL | Status: DC
  Administered 2021-04-19: 1 via ORAL
  Filled 2021-04-18 (×2): qty 1

## 2021-04-18 MED ORDER — ENOXAPARIN SODIUM 30 MG/0.3ML ~~LOC~~ SOLN
30.0000 mg | SUBCUTANEOUS | Status: DC
  Administered 2021-04-18 – 2021-04-20 (×3): 30 mg via SUBCUTANEOUS
  Filled 2021-04-18 (×3): qty 0.3

## 2021-04-18 NOTE — Progress Notes (Signed)
Initial Nutrition Assessment  DOCUMENTATION CODES:   Severe malnutrition in context of chronic illness,Underweight  INTERVENTION:  - will order Boost Breeze BID, each supplement provides 250 kcal and 9 grams of protein. - will order 30 ml Prosource Plus TID, each supplement provides 100 kcal and 15 grams protein.  - will order 1 tablet multivitamin with minerals/day. - diet advancement as medically feasible.    NUTRITION DIAGNOSIS:   Severe Malnutrition related to chronic illness as evidenced by moderate muscle depletion,severe fat depletion,severe muscle depletion.  GOAL:   Patient will meet greater than or equal to 90% of their needs  MONITOR:   PO intake,Supplement acceptance,Diet advancement,Labs,Weight trends  REASON FOR ASSESSMENT:   Malnutrition Screening Tool  ASSESSMENT:   35 year old female with medical history of Crohn's disease, Asherman syndrome, recent hospitalization for C. difficile colitis on Dificid, chronic abdominal pain, and anxiety disorder. She presented to the ED d/t intractable N/V, lower abdominal pain, and 10-12 watery stools/day. she was discharged on 4/13 for admission for Cdiff.  Patient laying in bed with no family or visitors present. She was having IV placed at the beginning of visit.   Patient reports having 4 episodes of cdiff in the past and that symptoms she is currently experiencing are consistent with her previous episodes of cdiff. She has had persistent N/V for the past week and has had difficulty keeping anything down. She cannot recall the last time she had solid food other than when she had a few pieces of candy 3-4 days ago d/t feeling blood sugar was low d/t feeling shaky.   Today she has only taken sips of water with PO medications as she has felt too nauseated to consume anything more and was awaiting ability to receive IV pain medication and IV anti-nausea medication.  At home she has anti-nausea medication but it is not overly  helpful and only provides relief for a very short period of time.   Weight yesterday was 93 lb, weight on 04/08/21 was 92 lb, and weight on 12/28/20 at [facility] was 95 lb. Patient confirms this range is consistent with her normal weight and that she has been within this range for several years.   Per notes: - intractable N/V - Cdiff colitis - hx of Crohn's disease   Labs reviewed; K: 3.3 mmol/l. Medications reviewed; 88 mcg oral synthroid/day, 40 mEq Klor-Con x1 dose 4/21. IVF; D5-LR @ 125 ml/hr (510 kcal/24 hours).    NUTRITION - FOCUSED PHYSICAL EXAM:  Flowsheet Row Most Recent Value  Orbital Region Mild depletion  Upper Arm Region Severe depletion  Thoracic and Lumbar Region Unable to assess  Buccal Region Severe depletion  Temple Region Moderate depletion  Clavicle Bone Region Severe depletion  Clavicle and Acromion Bone Region Severe depletion  Scapular Bone Region Unable to assess  Dorsal Hand Moderate depletion  Patellar Region Unable to assess  Anterior Thigh Region Unable to assess  Posterior Calf Region Unable to assess  Edema (RD Assessment) Unable to assess  Hair Reviewed  Eyes Reviewed  Mouth Reviewed  Skin Reviewed  Nails Unable to assess  [nail polish]       Diet Order:   Diet Order            Diet clear liquid Room service appropriate? Yes; Fluid consistency: Thin  Diet effective now                 EDUCATION NEEDS:   Not appropriate for education at this time  Skin:  Skin Assessment: Reviewed RN Assessment  Last BM:  PTA/unknown  Height:   Ht Readings from Last 1 Encounters:  04/17/21 5\' 1"  (1.549 m)    Weight:   Wt Readings from Last 1 Encounters:  04/17/21 42.2 kg     Estimated Nutritional Needs:  Kcal:  1475-1690 kcal Protein:  75-85 grams Fluid:  >/= 2.2 L/day     04/19/21, MS, RD, LDN, CNSC Inpatient Clinical Dietitian RD pager # available in [facility]  After hours/weekend pager # available in [facility]

## 2021-04-18 NOTE — ED Notes (Signed)
Hand off given to [place] Programmer, systems

## 2021-04-18 NOTE — Progress Notes (Signed)
Notified on call provider in regards to pt's pain level and c/o of nausea. See new orders.

## 2021-04-18 NOTE — Progress Notes (Signed)
Pt recently admitted to the floor. VSS. No signs of distress noted. Oriented pt to the room and the use of the call bell. All items within reach.   Paged provider in regards to additional orders for pt's pain and nausea. Pt request benadryl or phenergan for nausea. Awaiting response at this time.

## 2021-04-18 NOTE — Progress Notes (Signed)
PROGRESS NOTE    Kimberly Valencia  IWL:[id number] DOB: November 05, 1986 DOA: 04/17/2021 PCP: Pcp, No     Brief Narrative:  Kimberly Valencia is a 35 year old female with past medical history significant of Crohn's disease, Asherman syndrome, recent hospitalization of C. difficile colitis on Dificid, chronic abdominal pain, anxiety disorder who presents with intractable nausea and vomiting.  Patient was just discharged from the hospital on 04/10/2021 after admission for C. difficile colitis.  She was discharged home on Dificid.  She now returns with intractable nausea, vomiting, lower abdominal pain.  She states that she has not been able to keep anything down, having 10-12 episodes of watery stools a day.  New events last 24 hours / Subjective: She states that her lower abdominal pain is constant, feels similar to her C. difficile colitis pain as well as pain associated with her chronic Asherman syndrome.  Pain is alleviated by Dilaudid and oxycodone.  She also admits to nausea, dry heaving.  This is alleviated by combination of Benadryl and Phenergan.  Assessment & Plan:   Principal Problem:   C. difficile colitis Active Problems:   Intractable nausea and vomiting   Crohn disease (HCC)   Chronic abdominal pain   Intractable nausea, vomiting -Antiemetic as needed -IV fluid -Clear liquid diet  C. difficile colitis -Continue Dificid -No fevers, leukocytosis  History of Crohn's disease History of Asherman syndrome Chronic pain syndrome -Supportive care, continue oxycodone, Neurontin  Anxiety -Continue Prozac, Klonopin, Xanax  Hypokalemia -Replace, trend  Hypothyroidism -Continue Synthroid    DVT prophylaxis:  enoxaparin (LOVENOX) injection 30 mg Start: 04/18/21 1000  Code Status:     Code Status Orders  (From admission, onward)         Start     Ordered   04/18/21 0046  Full code  Continuous        04/18/21 0045        Code Status History    Date Active Date Inactive  Code Status Order ID Comments User Context   04/08/2021 1230 04/11/2021 2357 Full Code [id number]  Jae Dire, MD ED   Advance Care Planning Activity     Family Communication: No family at bedside Disposition Plan:  Status is: Inpatient  Remains inpatient appropriate because:Ongoing active pain requiring inpatient pain management   Dispo: The patient is from: Home              Anticipated d/c is to: Home              Patient currently is not medically stable to d/c.   Difficult to place patient No    Antimicrobials:  Anti-infectives (From admission, onward)   Start     Dose/Rate Route Frequency Ordered Stop   04/18/21 0145  fidaxomicin (DIFICID) tablet 200 mg  Status:  Discontinued        200 mg Oral 2 times daily 04/18/21 0045 04/18/21 0049   04/18/21 0145  fidaxomicin (DIFICID) tablet 200 mg        200 mg Oral 2 times daily 04/18/21 0045 04/27/21 2159        Objective: Vitals:   04/18/21 0009 04/18/21 0050 04/18/21 0401 04/18/21 0930  BP: 108/77 114/78 101/75 116/68  Pulse: (!) 104 96 70 100  Resp: 18 17 15 18   Temp: 98.7 F (37.1 C) 98.6 F (37 C) 98.1 F (36.7 C) 98.9 F (37.2 C)  TempSrc: Oral Oral  Oral  SpO2: 99% 100% 100% 100%  Weight:  Height:        Intake/Output Summary (Last 24 hours) at 04/18/2021 0943 Last data filed at 04/18/2021 8938 Gross per 24 hour  Intake 2685.41 ml  Output --  Net 2685.41 ml   Filed Weights   04/17/21 1643  Weight: 42.2 kg    Examination:  General exam: Appears calm and comfortable  Respiratory system: Clear to auscultation. Respiratory effort normal. No respiratory distress. No conversational dyspnea.  Cardiovascular system: S1 & S2 heard, RRR. No murmurs. No pedal edema. Gastrointestinal system: Abdomen is nondistended, soft and TTP pelvic/LLQ  Central nervous system: Alert and oriented. No focal neurological deficits. Speech clear.  Extremities: Symmetric in appearance  Skin: No rashes, lesions or ulcers on  exposed skin  Psychiatry: Judgement and insight appear normal. Mood & affect appropriate.   Data Reviewed: I have personally reviewed following labs and imaging studies  CBC: Recent Labs  Lab 04/17/21 1820 04/18/21 0318  WBC 7.8 7.6  HGB 12.1 10.2*  HCT 37.6 33.0*  MCV 86.0 89.4  PLT 358 376   Basic Metabolic Panel: Recent Labs  Lab 04/17/21 1948 04/18/21 0318  NA 139 140  K 3.8 3.3*  CL 108 108  CO2 20* 24  GLUCOSE 100* 104*  BUN 13 12  CREATININE 0.71 0.69  0.64  CALCIUM 8.8* 9.2  MG 1.6*  --    GFR: Estimated Creatinine Clearance: 66.6 mL/min (by C-G formula based on SCr of 0.69 mg/dL). Liver Function Tests: Recent Labs  Lab 04/17/21 1948 04/18/21 0318  AST 19 18  ALT 11 11  ALKPHOS 48 43  BILITOT 0.8 0.9  PROT 7.2 6.8  ALBUMIN 3.8 3.5   Recent Labs  Lab 04/17/21 1948  LIPASE 21   No results for input(s): AMMONIA in the last 168 hours. Coagulation Profile: No results for input(s): INR, PROTIME in the last 168 hours. Cardiac Enzymes: No results for input(s): CKTOTAL, CKMB, CKMBINDEX, TROPONINI in the last 168 hours. BNP (last 3 results) No results for input(s): PROBNP in the last 8760 hours. HbA1C: No results for input(s): HGBA1C in the last 72 hours. CBG: No results for input(s): GLUCAP in the last 168 hours. Lipid Profile: No results for input(s): CHOL, HDL, LDLCALC, TRIG, CHOLHDL, LDLDIRECT in the last 72 hours. Thyroid Function Tests: No results for input(s): TSH, T4TOTAL, FREET4, T3FREE, THYROIDAB in the last 72 hours. Anemia Panel: No results for input(s): VITAMINB12, FOLATE, FERRITIN, TIBC, IRON, RETICCTPCT in the last 72 hours. Sepsis Labs: Recent Labs  Lab 04/17/21 1820 04/17/21 1948  LATICACIDVEN 2.9* 1.9    Recent Results (from the past 240 hour(s))  SARS CORONAVIRUS 2 (TAT 6-24 HRS) Nasopharyngeal Nasopharyngeal Swab     Status: None   Collection Time: 04/08/21 11:13 AM   Specimen: Nasopharyngeal Swab  Result Value Ref  Range Status   SARS Coronavirus 2 NEGATIVE NEGATIVE Final    Comment: (NOTE) SARS-CoV-2 target nucleic acids are NOT DETECTED.  The SARS-CoV-2 RNA is generally detectable in upper and lower respiratory specimens during the acute phase of infection. Negative results do not preclude SARS-CoV-2 infection, do not rule out co-infections with other pathogens, and should not be used as the sole basis for treatment or other patient management decisions. Negative results must be combined with clinical observations, patient history, and epidemiological information. The expected result is Negative.  Fact Sheet for Patients: HairSlick.no  Fact Sheet for Healthcare Providers: quierodirigir.com  This test is not yet approved or cleared by the [place] FDA and  has  been authorized for detection and/or diagnosis of SARS-CoV-2 by FDA under an Emergency Use Authorization (EUA). This EUA will remain  in effect (meaning this test can be used) for the duration of the COVID-19 declaration under Se ction 564(b)(1) of the Act, 21 U.S.C. section 360bbb-3(b)(1), unless the authorization is terminated or revoked sooner.  Performed at [facility], [address], [place], [place] [address]   Resp Panel by RT-PCR (Flu A&B, Covid) Nasopharyngeal Swab     Status: None   Collection Time: 04/17/21 10:05 PM   Specimen: Nasopharyngeal Swab; Nasopharyngeal(NP) swabs in vial transport medium  Result Value Ref Range Status   SARS Coronavirus 2 by RT PCR NEGATIVE NEGATIVE Final    Comment: (NOTE) SARS-CoV-2 target nucleic acids are NOT DETECTED.  The SARS-CoV-2 RNA is generally detectable in upper respiratory specimens during the acute phase of infection. The lowest concentration of SARS-CoV-2 viral copies this assay can detect is 138 copies/mL. A negative result does not preclude SARS-Cov-2 infection and should not be used as the sole basis for  treatment or other patient management decisions. A negative result may occur with  improper specimen collection/handling, submission of specimen other than nasopharyngeal swab, presence of viral mutation(s) within the areas targeted by this assay, and inadequate number of viral copies(<138 copies/mL). A negative result must be combined with clinical observations, patient history, and epidemiological information. The expected result is Negative.  Fact Sheet for Patients:  BloggerCourse.com  Fact Sheet for Healthcare Providers:  SeriousBroker.it  This test is no t yet approved or cleared by the [place] FDA and  has been authorized for detection and/or diagnosis of SARS-CoV-2 by FDA under an Emergency Use Authorization (EUA). This EUA will remain  in effect (meaning this test can be used) for the duration of the COVID-19 declaration under Section 564(b)(1) of the Act, 21 U.S.C.section 360bbb-3(b)(1), unless the authorization is terminated  or revoked sooner.       Influenza A by PCR NEGATIVE NEGATIVE Final   Influenza B by PCR NEGATIVE NEGATIVE Final    Comment: (NOTE) The Xpert Xpress SARS-CoV-2/FLU/RSV plus assay is intended as an aid in the diagnosis of influenza from Nasopharyngeal swab specimens and should not be used as a sole basis for treatment. Nasal washings and aspirates are unacceptable for Xpert Xpress SARS-CoV-2/FLU/RSV testing.  Fact Sheet for Patients: BloggerCourse.com  Fact Sheet for Healthcare Providers: SeriousBroker.it  This test is not yet approved or cleared by the [place] FDA and has been authorized for detection and/or diagnosis of SARS-CoV-2 by FDA under an Emergency Use Authorization (EUA). This EUA will remain in effect (meaning this test can be used) for the duration of the COVID-19 declaration under Section 564(b)(1) of the Act, 21  U.S.C. section 360bbb-3(b)(1), unless the authorization is terminated or revoked.  Performed at [facility], [address], [place], [place] [address]       Radiology Studies: DG Abdomen Acute W/Chest  Result Date: 04/17/2021 CLINICAL DATA:  Abdominal pain EXAM: DG ABDOMEN ACUTE WITH 1 VIEW CHEST COMPARISON:  None. FINDINGS: There is no evidence of dilated bowel loops or free intraperitoneal air. No radiopaque calculi or other significant radiographic abnormality is seen. Heart size and mediastinal contours are within normal limits. Both lungs are clear. IMPRESSION: Negative abdominal radiographs.  No acute cardiopulmonary disease. Electronically Signed   By: Charlett Nose M.D.   On: 04/17/2021 21:15      Scheduled Meds: . enoxaparin (LOVENOX) injection  30 mg  Subcutaneous Q24H  . fidaxomicin  200 mg Oral BID  .  HYDROmorphone (DILAUDID) injection  0.5 mg Intravenous Once  . levothyroxine  88 mcg Oral Q0600  . potassium chloride  40 mEq Oral Once   Continuous Infusions: . dextrose 5% lactated ringers 125 mL/hr at 04/18/21 0151  . promethazine (PHENERGAN) injection (IM or IVPB) 12.5 mg (04/18/21 0836)     LOS: 1 day      Time spent: 25 minutes   Noralee StainJennifer Haani Bakula, DO [facility] 04/18/2021, 9:44 AM   Available via Epic secure chat 7am-7pm After these hours, please refer to coverage provider listed on [email or web address]

## 2021-04-18 NOTE — Progress Notes (Signed)
Notified on call provider Emeline Darling, NP) per pt request. Awaiting response

## 2021-04-18 NOTE — Progress Notes (Addendum)
MD aware of pt's request

## 2021-04-19 LAB — BASIC METABOLIC PANEL
Anion gap: 8 (ref 5–15)
BUN: <5 mg/dL — ABNORMAL LOW (ref 6–20)
CO2: 25 mmol/L (ref 22–32)
Calcium: 9.2 mg/dL (ref 8.9–10.3)
Chloride: 106 mmol/L (ref 98–111)
Creatinine, Ser: 0.72 mg/dL (ref 0.44–1.00)
GFR, Estimated: >60 mL/min (ref >60)
Glucose, Bld: 66 mg/dL — ABNORMAL LOW (ref 70–99)
Potassium: 3.1 mmol/L — ABNORMAL LOW (ref 3.5–5.1)
Sodium: 139 mmol/L (ref 135–145)

## 2021-04-19 LAB — CBC
HCT: 35.5 % — ABNORMAL LOW (ref 36.0–46.0)
Hemoglobin: 11 g/dL — ABNORMAL LOW (ref 12.0–15.0)
MCH: 27.8 pg (ref 26.0–34.0)
MCHC: 31 g/dL (ref 30.0–36.0)
MCV: 89.9 fL (ref 80.0–100.0)
Platelets: 329 10*3/uL (ref 150–400)
RBC: 3.95 MIL/uL (ref 3.87–5.11)
RDW: 15.7 % — ABNORMAL HIGH (ref 11.5–15.5)
WBC: 4.9 10*3/uL (ref 4.0–10.5)
nRBC: 0 % (ref 0.0–0.2)

## 2021-04-19 LAB — MAGNESIUM
Magnesium: 1.5 mg/dL — ABNORMAL LOW (ref 1.7–2.4)
Magnesium: 1.6 mg/dL — ABNORMAL LOW (ref 1.7–2.4)

## 2021-04-19 MED ORDER — POTASSIUM CHLORIDE CRYS ER 20 MEQ PO TBCR
40.0000 meq | EXTENDED_RELEASE_TABLET | Freq: Once | ORAL | Status: DC
  Filled 2021-04-19: qty 2

## 2021-04-19 MED ORDER — SACCHAROMYCES BOULARDII 250 MG PO CAPS
250.0000 mg | ORAL_CAPSULE | Freq: Two times a day (BID) | ORAL | Status: DC
  Administered 2021-04-19 – 2021-04-21 (×5): 250 mg via ORAL
  Filled 2021-04-19 (×5): qty 1

## 2021-04-19 MED ORDER — POTASSIUM CHLORIDE 10 MEQ/100ML IV SOLN
10.0000 meq | INTRAVENOUS | Status: AC
  Administered 2021-04-19 (×4): 10 meq via INTRAVENOUS
  Filled 2021-04-19: qty 100

## 2021-04-19 NOTE — Progress Notes (Addendum)
PROGRESS NOTE    Kimberly Valencia  MGN:[id number] DOB: 01/21/1986 DOA: 04/17/2021 PCP: Pcp, No  Brief Narrative:35 year old female with past medical history significant of Crohn's disease, Asherman syndrome, recent hospitalization of C. difficile colitis on Dificid, chronic abdominal pain, anxiety disorder who presents with intractable nausea and vomiting.  Patient was just discharged from the hospital on 04/10/2021 after admission for C. difficile colitis.  She was discharged home on Dificid.  She now returns with intractable nausea, vomiting, lower abdominal pain.  She states that she has not been able to keep anything down, having 10-12 episodes of watery stools a day.  Assessment & Plan:   Principal Problem:   C. difficile colitis Active Problems:   Intractable nausea and vomiting   Crohn disease (HCC)   Chronic abdominal pain   Protein-calorie malnutrition, severe   #1 presumed C. difficile on Dificid.  Patient continues with nausea and loose stools.  Continue IV fluids.  Add Florastor.  Clear liquid diet advance as tolerated.  #2 history of Crohn's disease/chronic pain syndrome/Asherman syndrome-continue supportive treatment follow-up with [facility].  #3 history of anxiety on Xanax Klonopin and Prozac  #4 hypokalemia replace potassium  #5 hypothyroidism on Synthroid.  #6 hypomagnesemia replete and recheck labs.      Nutrition Problem: Severe Malnutrition Etiology: chronic illness     Signs/Symptoms: moderate muscle depletion,severe fat depletion,severe muscle depletion    Interventions: Boost Breeze,Prostat,MVI  Estimated body mass index is 17.57 kg/m as calculated from the following:   Height as of this encounter: 5\' 1"  (1.549 m).   Weight as of this encounter: 42.2 kg.  DVT prophylaxis: Lovenox  code Status: Full code Family Communication: None at bedside  disposition Plan:  Status is: Inpatient   Dispo: The patient is from: Home              Anticipated  d/c is to: Home              Patient currently is not medically stable to d/c.   Difficult to place patient No    Consultants: None  Procedures: None Antimicrobials: Dificid Subjective: Complains of nausea and multiple loose stools overnight  Objective: Vitals:   04/18/21 0930 04/18/21 1345 04/18/21 1934 04/19/21 0510  BP: 116/68 109/71 117/75 106/69  Pulse: 100 76 76 80  Resp: 18 18 17 16   Temp: 98.9 F (37.2 C)  98.3 F (36.8 C) 98.1 F (36.7 C)  TempSrc: Oral  Oral Oral  SpO2: 100% 100% 100% 100%  Weight:      Height:        Intake/Output Summary (Last 24 hours) at 04/19/2021 1157 Last data filed at 04/19/2021 0513 Gross per 24 hour  Intake 2275.5 ml  Output 900 ml  Net 1375.5 ml   Filed Weights   04/17/21 1643  Weight: 42.2 kg    Examination:  General exam: Appears calm and comfortable  Respiratory system: Clear to auscultation. Respiratory effort normal. Cardiovascular system: S1 & S2 heard, RRR. No JVD, murmurs, rubs, gallops or clicks. No pedal edema. Gastrointestinal system: Abdomen is distended, soft and tender. No organomegaly or masses felt. Normal bowel sounds heard. Central nervous system: Alert and oriented. No focal neurological deficits. Extremities: Symmetric 5 x 5 power. Skin: No rashes, lesions or ulcers Psychiatry: Judgement and insight appear normal. Mood & affect appropriate.     Data Reviewed: I have personally reviewed following labs and imaging studies  CBC: Recent Labs  Lab 04/17/21 1820 04/18/21 0318 04/19/21 04/20/21  WBC 7.8 7.6 4.9  HGB 12.1 10.2* 11.0*  HCT 37.6 33.0* 35.5*  MCV 86.0 89.4 89.9  PLT 358 376 329   Basic Metabolic Panel: Recent Labs  Lab 04/17/21 1948 04/18/21 0318 04/19/21 0306  NA 139 140 139  K 3.8 3.3* 3.1*  CL 108 108 106  CO2 20* 24 25  GLUCOSE 100* 104* 66*  BUN 13 12 <5*  CREATININE 0.71 0.69  0.64 0.72  CALCIUM 8.8* 9.2 9.2  MG 1.6*  --  1.5*   GFR: Estimated Creatinine Clearance: 66.6  mL/min (by C-G formula based on SCr of 0.72 mg/dL). Liver Function Tests: Recent Labs  Lab 04/17/21 1948 04/18/21 0318  AST 19 18  ALT 11 11  ALKPHOS 48 43  BILITOT 0.8 0.9  PROT 7.2 6.8  ALBUMIN 3.8 3.5   Recent Labs  Lab 04/17/21 1948  LIPASE 21   No results for input(s): AMMONIA in the last 168 hours. Coagulation Profile: No results for input(s): INR, PROTIME in the last 168 hours. Cardiac Enzymes: No results for input(s): CKTOTAL, CKMB, CKMBINDEX, TROPONINI in the last 168 hours. BNP (last 3 results) No results for input(s): PROBNP in the last 8760 hours. HbA1C: No results for input(s): HGBA1C in the last 72 hours. CBG: No results for input(s): GLUCAP in the last 168 hours. Lipid Profile: No results for input(s): CHOL, HDL, LDLCALC, TRIG, CHOLHDL, LDLDIRECT in the last 72 hours. Thyroid Function Tests: No results for input(s): TSH, T4TOTAL, FREET4, T3FREE, THYROIDAB in the last 72 hours. Anemia Panel: No results for input(s): VITAMINB12, FOLATE, FERRITIN, TIBC, IRON, RETICCTPCT in the last 72 hours. Sepsis Labs: Recent Labs  Lab 04/17/21 1820 04/17/21 1948  LATICACIDVEN 2.9* 1.9    Recent Results (from the past 240 hour(s))  Resp Panel by RT-PCR (Flu A&B, Covid) Nasopharyngeal Swab     Status: None   Collection Time: 04/17/21 10:05 PM   Specimen: Nasopharyngeal Swab; Nasopharyngeal(NP) swabs in vial transport medium  Result Value Ref Range Status   SARS Coronavirus 2 by RT PCR NEGATIVE NEGATIVE Final    Comment: (NOTE) SARS-CoV-2 target nucleic acids are NOT DETECTED.  The SARS-CoV-2 RNA is generally detectable in upper respiratory specimens during the acute phase of infection. The lowest concentration of SARS-CoV-2 viral copies this assay can detect is 138 copies/mL. A negative result does not preclude SARS-Cov-2 infection and should not be used as the sole basis for treatment or other patient management decisions. A negative result may occur with   improper specimen collection/handling, submission of specimen other than nasopharyngeal swab, presence of viral mutation(s) within the areas targeted by this assay, and inadequate number of viral copies(<138 copies/mL). A negative result must be combined with clinical observations, patient history, and epidemiological information. The expected result is Negative.  Fact Sheet for Patients:  BloggerCourse.com  Fact Sheet for Healthcare Providers:  SeriousBroker.it  This test is no t yet approved or cleared by the [place] FDA and  has been authorized for detection and/or diagnosis of SARS-CoV-2 by FDA under an Emergency Use Authorization (EUA). This EUA will remain  in effect (meaning this test can be used) for the duration of the COVID-19 declaration under Section 564(b)(1) of the Act, 21 U.S.C.section 360bbb-3(b)(1), unless the authorization is terminated  or revoked sooner.       Influenza A by PCR NEGATIVE NEGATIVE Final   Influenza B by PCR NEGATIVE NEGATIVE Final    Comment: (NOTE) The Xpert Xpress SARS-CoV-2/FLU/RSV plus assay is intended as an  aid in the diagnosis of influenza from Nasopharyngeal swab specimens and should not be used as a sole basis for treatment. Nasal washings and aspirates are unacceptable for Xpert Xpress SARS-CoV-2/FLU/RSV testing.  Fact Sheet for Patients: BloggerCourse.com  Fact Sheet for Healthcare Providers: SeriousBroker.it  This test is not yet approved or cleared by the [place] FDA and has been authorized for detection and/or diagnosis of SARS-CoV-2 by FDA under an Emergency Use Authorization (EUA). This EUA will remain in effect (meaning this test can be used) for the duration of the COVID-19 declaration under Section 564(b)(1) of the Act, 21 U.S.C. section 360bbb-3(b)(1), unless the authorization is terminated  or revoked.  Performed at [facility], [address] [place], [place] [address]          Radiology Studies: DG Abdomen Acute W/Chest  Result Date: 04/17/2021 CLINICAL DATA:  Abdominal pain EXAM: DG ABDOMEN ACUTE WITH 1 VIEW CHEST COMPARISON:  None. FINDINGS: There is no evidence of dilated bowel loops or free intraperitoneal air. No radiopaque calculi or other significant radiographic abnormality is seen. Heart size and mediastinal contours are within normal limits. Both lungs are clear. IMPRESSION: Negative abdominal radiographs.  No acute cardiopulmonary disease. Electronically Signed   By: Charlett Nose M.D.   On: 04/17/2021 21:15        Scheduled Meds: . (feeding supplement) PROSource Plus  30 mL Oral TID BM  . ALPRAZolam  1 mg Oral BID  . clonazePAM  0.5 mg Oral Daily  . enoxaparin (LOVENOX) injection  30 mg Subcutaneous Q24H  . feeding supplement  1 Container Oral BID BM  . fidaxomicin  200 mg Oral BID  . FLUoxetine  60 mg Oral q morning  . gabapentin  200 mg Oral Daily  . levothyroxine  88 mcg Oral Q0600  . multivitamin with minerals  1 tablet Oral Daily  . saccharomyces boulardii  250 mg Oral BID   Continuous Infusions: . dextrose 5% lactated ringers 125 mL/hr at 04/19/21 0513  . potassium chloride    . promethazine (PHENERGAN) injection (IM or IVPB) 12.5 mg (04/19/21 0926)     LOS: 2 days     Alwyn Ren, MD [facility]  04/19/2021, 11:57 AM

## 2021-04-19 NOTE — Progress Notes (Signed)
Patient states she is unable to tolerate PO potassium d/t is upsetting her stomach and causing more nausea/pain. MD messaged.

## 2021-04-19 NOTE — Progress Notes (Signed)
Pt request Ambien for sleep, pt does take prescribed med at home. Order d/c earlier today. Notified on call provider, no new orders at this time d/t multiple sedative prn medications received per [facility] NP. All other needs met at this time.

## 2021-04-20 LAB — BASIC METABOLIC PANEL
Anion gap: 7 (ref 5–15)
BUN: 6 mg/dL (ref 6–20)
CO2: 27 mmol/L (ref 22–32)
Calcium: 9.5 mg/dL (ref 8.9–10.3)
Chloride: 102 mmol/L (ref 98–111)
Creatinine, Ser: 0.64 mg/dL (ref 0.44–1.00)
GFR, Estimated: >60 mL/min (ref >60)
Glucose, Bld: 91 mg/dL (ref 70–99)
Potassium: 3.7 mmol/L (ref 3.5–5.1)
Sodium: 136 mmol/L (ref 135–145)

## 2021-04-20 LAB — MAGNESIUM: Magnesium: 1.5 mg/dL — ABNORMAL LOW (ref 1.7–2.4)

## 2021-04-20 MED ORDER — HYDROMORPHONE HCL 1 MG/ML IJ SOLN
0.5000 mg | Freq: Four times a day (QID) | INTRAMUSCULAR | Status: DC | PRN
  Administered 2021-04-20 – 2021-04-21 (×3): 0.5 mg via INTRAVENOUS
  Filled 2021-04-20 (×3): qty 0.5

## 2021-04-20 MED ORDER — MAGNESIUM SULFATE 2 GM/50ML IV SOLN
2.0000 g | Freq: Once | INTRAVENOUS | Status: AC
  Administered 2021-04-20: 2 g via INTRAVENOUS
  Filled 2021-04-20: qty 50

## 2021-04-20 NOTE — Plan of Care (Signed)
  Problem: Clinical Measurements: Goal: Respiratory complications will improve Outcome: Progressing   Problem: Clinical Measurements: Goal: Cardiovascular complication will be avoided Outcome: Progressing   Problem: Coping: Goal: Level of anxiety will decrease Outcome: Progressing   Problem: Pain Managment: Goal: General experience of comfort will improve Outcome: Progressing   Problem: Skin Integrity: Goal: Risk for impaired skin integrity will decrease Outcome: Progressing   

## 2021-04-20 NOTE — Progress Notes (Signed)
PROGRESS NOTE    Kimberly Valencia  ALP:[id number] DOB: November 14, 1986 DOA: 04/17/2021 PCP: Pcp, No  Brief Narrative:35 year old female with past medical history significant of Crohn's disease, Asherman syndrome, recent hospitalization of C. difficile colitis on Dificid, chronic abdominal pain, anxiety disorder who presents with intractable nausea and vomiting.  Patient was just discharged from the hospital on 04/10/2021 after admission for C. difficile colitis.  She was discharged home on Dificid.  She now returns with intractable nausea, vomiting, lower abdominal pain.  She states that she has not been able to keep anything down, having 10-12 episodes of watery stools a day.  Assessment & Plan:   Principal Problem:   C. difficile colitis Active Problems:   Intractable nausea and vomiting   Crohn disease (HCC)   Chronic abdominal pain   Protein-calorie malnutrition, severe   #1 presumed C. difficile on Dificid.  Patient continues with nausea and loose stools.  Continue IV fluids.  Added Florastor.  Clear liquid diet advance as tolerated.  #2 history of Crohn's disease/chronic pain syndrome/Asherman syndrome-continue supportive treatment follow-up with [facility].  #3 history of anxiety on Xanax Klonopin and Prozac  #4 hypokalemia replaced potassium.k 3.7   #5 hypothyroidism on Synthroid.  #6 hypomagnesemia replete and recheck labs.mag 1.5.       Nutrition Problem: Severe Malnutrition Etiology: chronic illness     Signs/Symptoms: moderate muscle depletion,severe fat depletion,severe muscle depletion    Interventions: Boost Breeze,Prostat,MVI  Estimated body mass index is 17.57 kg/m as calculated from the following:   Height as of this encounter: 5\' 1"  (1.549 m).   Weight as of this encounter: 42.2 kg.  DVT prophylaxis: Lovenox  code Status: Full code Family Communication: None at bedside  disposition Plan:  Status is: Inpatient   Dispo: The patient is from: Home               Anticipated d/c is to: Home              Patient currently is not medically stable to d/c.   Difficult to place patient No    Consultants: None  Procedures: None Antimicrobials: Dificid Subjective: Continues to complain of nausea not able to eat anything remains on IV fluids had multiple loose stools overnight feels lightheaded on standing or walking Objective: Vitals:   04/19/21 1318 04/19/21 1932 04/20/21 0550 04/20/21 1249  BP: 113/79 102/61 108/69 109/64  Pulse: 98 77 77 91  Resp: 20 17 16 18   Temp: 98.4 F (36.9 C) 98 F (36.7 C) 98 F (36.7 C) 98.5 F (36.9 C)  TempSrc: Oral Oral  Oral  SpO2: 100% 100% 100% 99%  Weight:      Height:        Intake/Output Summary (Last 24 hours) at 04/20/2021 1333 Last data filed at 04/20/2021 1255 Gross per 24 hour  Intake 2554.87 ml  Output 1000 ml  Net 1554.87 ml   Filed Weights   04/17/21 1643  Weight: 42.2 kg    Examination:  General exam: Appears frail  Respiratory system: Clear to auscultation. Respiratory effort normal. Cardiovascular system: S1 & S2 heard, RRR. No JVD, murmurs, rubs, gallops or clicks. No pedal edema. Gastrointestinal system: Abdomen is distended, soft and tender. No organomegaly or masses felt. Normal bowel sounds heard. Central nervous system: Alert and oriented. No focal neurological deficits. Extremities: Symmetric 5 x 5 power. Skin: No rashes, lesions or ulcers Psychiatry: Judgement and insight appear normal. Mood & affect appropriate.     Data Reviewed: I  have personally reviewed following labs and imaging studies  CBC: Recent Labs  Lab 04/17/21 1820 04/18/21 0318 04/19/21 0306  WBC 7.8 7.6 4.9  HGB 12.1 10.2* 11.0*  HCT 37.6 33.0* 35.5*  MCV 86.0 89.4 89.9  PLT 358 376 329   Basic Metabolic Panel: Recent Labs  Lab 04/17/21 1948 04/18/21 0318 04/19/21 0306 04/19/21 1236 04/20/21 0222  NA 139 140 139  --  136  K 3.8 3.3* 3.1*  --  3.7  CL 108 108 106  --  102  CO2 20*  24 25  --  27  GLUCOSE 100* 104* 66*  --  91  BUN 13 12 <5*  --  6  CREATININE 0.71 0.69  0.64 0.72  --  0.64  CALCIUM 8.8* 9.2 9.2  --  9.5  MG 1.6*  --  1.5* 1.6* 1.5*   GFR: Estimated Creatinine Clearance: 66.6 mL/min (by C-G formula based on SCr of 0.64 mg/dL). Liver Function Tests: Recent Labs  Lab 04/17/21 1948 04/18/21 0318  AST 19 18  ALT 11 11  ALKPHOS 48 43  BILITOT 0.8 0.9  PROT 7.2 6.8  ALBUMIN 3.8 3.5   Recent Labs  Lab 04/17/21 1948  LIPASE 21   No results for input(s): AMMONIA in the last 168 hours. Coagulation Profile: No results for input(s): INR, PROTIME in the last 168 hours. Cardiac Enzymes: No results for input(s): CKTOTAL, CKMB, CKMBINDEX, TROPONINI in the last 168 hours. BNP (last 3 results) No results for input(s): PROBNP in the last 8760 hours. HbA1C: No results for input(s): HGBA1C in the last 72 hours. CBG: No results for input(s): GLUCAP in the last 168 hours. Lipid Profile: No results for input(s): CHOL, HDL, LDLCALC, TRIG, CHOLHDL, LDLDIRECT in the last 72 hours. Thyroid Function Tests: No results for input(s): TSH, T4TOTAL, FREET4, T3FREE, THYROIDAB in the last 72 hours. Anemia Panel: No results for input(s): VITAMINB12, FOLATE, FERRITIN, TIBC, IRON, RETICCTPCT in the last 72 hours. Sepsis Labs: Recent Labs  Lab 04/17/21 1820 04/17/21 1948  LATICACIDVEN 2.9* 1.9    Recent Results (from the past 240 hour(s))  Resp Panel by RT-PCR (Flu A&B, Covid) Nasopharyngeal Swab     Status: None   Collection Time: 04/17/21 10:05 PM   Specimen: Nasopharyngeal Swab; Nasopharyngeal(NP) swabs in vial transport medium  Result Value Ref Range Status   SARS Coronavirus 2 by RT PCR NEGATIVE NEGATIVE Final    Comment: (NOTE) SARS-CoV-2 target nucleic acids are NOT DETECTED.  The SARS-CoV-2 RNA is generally detectable in upper respiratory specimens during the acute phase of infection. The lowest concentration of SARS-CoV-2 viral copies this assay  can detect is 138 copies/mL. A negative result does not preclude SARS-Cov-2 infection and should not be used as the sole basis for treatment or other patient management decisions. A negative result may occur with  improper specimen collection/handling, submission of specimen other than nasopharyngeal swab, presence of viral mutation(s) within the areas targeted by this assay, and inadequate number of viral copies(<138 copies/mL). A negative result must be combined with clinical observations, patient history, and epidemiological information. The expected result is Negative.  Fact Sheet for Patients:  BloggerCourse.com  Fact Sheet for Healthcare Providers:  SeriousBroker.it  This test is no t yet approved or cleared by the [place] FDA and  has been authorized for detection and/or diagnosis of SARS-CoV-2 by FDA under an Emergency Use Authorization (EUA). This EUA will remain  in effect (meaning this test can be used) for the duration  of the COVID-19 declaration under Section 564(b)(1) of the Act, 21 U.S.C.section 360bbb-3(b)(1), unless the authorization is terminated  or revoked sooner.       Influenza A by PCR NEGATIVE NEGATIVE Final   Influenza B by PCR NEGATIVE NEGATIVE Final    Comment: (NOTE) The Xpert Xpress SARS-CoV-2/FLU/RSV plus assay is intended as an aid in the diagnosis of influenza from Nasopharyngeal swab specimens and should not be used as a sole basis for treatment. Nasal washings and aspirates are unacceptable for Xpert Xpress SARS-CoV-2/FLU/RSV testing.  Fact Sheet for Patients: BloggerCourse.com  Fact Sheet for Healthcare Providers: SeriousBroker.it  This test is not yet approved or cleared by the [place] FDA and has been authorized for detection and/or diagnosis of SARS-CoV-2 by FDA under an Emergency Use Authorization (EUA). This EUA will  remain in effect (meaning this test can be used) for the duration of the COVID-19 declaration under Section 564(b)(1) of the Act, 21 U.S.C. section 360bbb-3(b)(1), unless the authorization is terminated or revoked.  Performed at [facility], [address], [place], [place] [address]          Radiology Studies: No results found.      Scheduled Meds: . (feeding supplement) PROSource Plus  30 mL Oral TID BM  . ALPRAZolam  1 mg Oral BID  . clonazePAM  0.5 mg Oral Daily  . enoxaparin (LOVENOX) injection  30 mg Subcutaneous Q24H  . feeding supplement  1 Container Oral BID BM  . fidaxomicin  200 mg Oral BID  . FLUoxetine  60 mg Oral q morning  . gabapentin  200 mg Oral Daily  . levothyroxine  88 mcg Oral Q0600  . multivitamin with minerals  1 tablet Oral Daily  . saccharomyces boulardii  250 mg Oral BID   Continuous Infusions: . dextrose 5% lactated ringers 125 mL/hr at 04/20/21 0940  . promethazine (PHENERGAN) injection (IM or IVPB) 12.5 mg (04/20/21 1030)     LOS: 3 days     Alwyn Ren, MD [facility]  04/20/2021, 1:33 PM

## 2021-04-21 LAB — BASIC METABOLIC PANEL
Anion gap: 6 (ref 5–15)
BUN: 8 mg/dL (ref 6–20)
CO2: 29 mmol/L (ref 22–32)
Calcium: 8.9 mg/dL (ref 8.9–10.3)
Chloride: 102 mmol/L (ref 98–111)
Creatinine, Ser: 0.89 mg/dL (ref 0.44–1.00)
GFR, Estimated: >60 mL/min (ref >60)
Glucose, Bld: 103 mg/dL — ABNORMAL HIGH (ref 70–99)
Potassium: 3.9 mmol/L (ref 3.5–5.1)
Sodium: 137 mmol/L (ref 135–145)

## 2021-04-21 LAB — MAGNESIUM: Magnesium: 1.9 mg/dL (ref 1.7–2.4)

## 2021-04-21 MED ORDER — ADULT MULTIVITAMIN W/MINERALS CH: 1.0000 | ORAL_TABLET | Freq: Every day | ORAL | Status: DC

## 2021-04-21 MED ORDER — SACCHAROMYCES BOULARDII 250 MG PO CAPS: 250.0000 mg | ORAL_CAPSULE | Freq: Two times a day (BID) | ORAL | 2 refills | Status: DC

## 2021-04-21 MED ORDER — ALPRAZOLAM 1 MG PO TABS: 1.0000 mg | ORAL_TABLET | Freq: Two times a day (BID) | ORAL | 0 refills | Status: DC

## 2021-04-21 NOTE — Plan of Care (Signed)
  Problem: Clinical Measurements: Goal: Diagnostic test results will improve Outcome: Progressing   Problem: Clinical Measurements: Goal: Respiratory complications will improve Outcome: Progressing   Problem: Clinical Measurements: Goal: Cardiovascular complication will be avoided Outcome: Progressing   Problem: Coping: Goal: Level of anxiety will decrease Outcome: Progressing   Problem: Pain Managment: Goal: General experience of comfort will improve Outcome: Progressing   Problem: Safety: Goal: Ability to remain free from injury will improve Outcome: Progressing   

## 2021-04-21 NOTE — Progress Notes (Signed)
Pt stable at time of d/c instructions and education. No needs at time of discharge instructions.

## 2021-04-21 NOTE — Discharge Summary (Signed)
Physician Discharge Summary  Kimberly Valencia GHW:[id number] DOB: 1986-10-20 DOA: 04/17/2021  PCP: Pcp, No  Admit date: 04/17/2021 Discharge date: 04/21/2021  Admitted From: Home Disposition: Home  recommendations for Outpatient Follow-up:  1. Follow up with PCP in 1-2 weeks 2. Please obtain BMP/CBC in one week  Home Health: None Equipment/Devices: None Discharge Condition: Stable  CODE STATUS: Full code  diet recommendation: Cardiac Brief/Interim Summary:35 year old female with past medical history significant of Crohn's disease, Asherman syndrome, recent hospitalization of C. difficile colitis on Dificid, chronic abdominal pain, anxiety disorder who presents with intractable nausea and vomiting. Patient was just discharged from the hospital on 04/10/2021 after admission for C. difficile colitis. She was discharged home on Dificid. She now returns with intractable nausea, vomiting, lower abdominal pain. She states that she has not been able to keep anything down, having 10-12 episodes of watery stools a day  Discharge Diagnoses:  Principal Problem:   C. difficile colitis Active Problems:   Intractable nausea and vomiting   Crohn disease (HCC)   Chronic abdominal pain   Protein-calorie malnutrition, severe     #1 presumed C. difficile on Dificid-she finished a course of Dificid.  Continue Florastor.  She was able to tolerated diet prior to discharge.  #2 history of Crohn's disease/chronic pain syndrome/Asherman syndrome-follow-up with [facility].  #3 history of anxiety on Xanax Klonopin and Prozac  #4 hypokalemia replaced potassium.k 3.9   #5 hypothyroidism on Synthroid.  #6 hypomagnesemia repleted and recheck labs.mag 1.9    Nutrition Problem: Severe Malnutrition Etiology: chronic illness    Signs/Symptoms: moderate muscle depletion,severe fat depletion,severe muscle depletion     Interventions: Boost Breeze,Prostat,MVI  Estimated body mass index is 17.57 kg/m  as calculated from the following:   Height as of this encounter: 5\' 1"  (1.549 m).   Weight as of this encounter: 42.2 kg.  Discharge Instructions  Discharge Instructions    Diet - low sodium heart healthy   Complete by: As directed    Increase activity slowly   Complete by: As directed      Allergies as of 04/21/2021      Reactions   Trazodone    Other reaction(s): Other   Bentyl [dicyclomine Hcl]    Compazine [prochlorperazine]    Reglan [metoclopramide]    Sulfa Antibiotics       Medication List    STOP taking these medications   fidaxomicin 200 MG Tabs tablet Commonly known as: DIFICID     TAKE these medications   ALPRAZolam 1 MG tablet Commonly known as: XANAX Take 1 tablet (1 mg total) by mouth 2 (two) times daily.   clonazePAM 0.5 MG tablet Commonly known as: KLONOPIN Take 0.5 mg by mouth daily.   FLUoxetine 20 MG capsule Commonly known as: PROZAC Take 60 mg by mouth every morning.   gabapentin 100 MG capsule Commonly known as: NEURONTIN Take 200 mg by mouth daily.   levothyroxine 88 MCG tablet Commonly known as: SYNTHROID Take 88 mcg by mouth daily before breakfast.   multivitamin with minerals Tabs tablet Take 1 tablet by mouth daily. Start taking on: April 22, 2021   ondansetron 4 MG disintegrating tablet Commonly known as: Zofran ODT Take 1 tablet (4 mg total) by mouth every 8 (eight) hours as needed for nausea or vomiting.   Oxycodone HCl 10 MG Tabs Take 10 mg by mouth every 4 (four) hours as needed for pain.   saccharomyces boulardii 250 MG capsule Commonly known as: FLORASTOR Take 1 capsule (250  mg total) by mouth 2 (two) times daily.   zolpidem 10 MG tablet Commonly known as: AMBIEN Take 10 mg by mouth at bedtime.       Allergies  Allergen Reactions  . Trazodone     Other reaction(s): Other  . Bentyl [Dicyclomine Hcl]   . Compazine [Prochlorperazine]   . Reglan [Metoclopramide]   . Sulfa Antibiotics      Consultations:  [facility]   Procedures/Studies: CT ABDOMEN PELVIS W CONTRAST  Result Date: 04/08/2021 CLINICAL DATA:  Nausea, vomiting, abdominal pain, Crohn's disease, C diff EXAM: CT ABDOMEN AND PELVIS WITH CONTRAST TECHNIQUE: Multidetector CT imaging of the abdomen and pelvis was performed using the standard protocol following bolus administration of intravenous contrast. CONTRAST:  OMNIPAQUE IOHEXOL 300 MG/ML  SOLN COMPARISON:  None. FINDINGS: Lower chest: No acute abnormality.  Bilateral breast implants. Hepatobiliary: No solid liver abnormality is seen. No gallstones, gallbladder wall thickening, or biliary dilatation. Pancreas: Unremarkable. No pancreatic ductal dilatation or surrounding inflammatory changes. Spleen: Normal in size without significant abnormality. Adrenals/Urinary Tract: Adrenal glands are unremarkable. Kidneys are normal, without renal calculi, solid lesion, or hydronephrosis. Bladder is unremarkable. Stomach/Bowel: Stomach is within normal limits. Appendix appears normal. No evidence of bowel wall thickening, distention, or inflammatory changes. Vascular/Lymphatic: No significant vascular findings are present. No enlarged abdominal or pelvic lymph nodes. Reproductive: No mass or other significant abnormality. Ovarian cysts and follicles. Other: No abdominal wall hernia or abnormality. No abdominopelvic ascites. Musculoskeletal: No acute or significant osseous findings. IMPRESSION: 1. No acute CT findings of the abdomen or pelvis to explain nausea, vomiting, or abdominal pain. 2.  No CT findings of Crohn's disease. 3.  No CT evidence of colitis. Electronically Signed   By: Lauralyn Primes M.D.   On: 04/08/2021 18:58   DG Abdomen Acute W/Chest  Result Date: 04/17/2021 CLINICAL DATA:  Abdominal pain EXAM: DG ABDOMEN ACUTE WITH 1 VIEW CHEST COMPARISON:  None. FINDINGS: There is no evidence of dilated bowel loops or free intraperitoneal air. No radiopaque calculi or other  significant radiographic abnormality is seen. Heart size and mediastinal contours are within normal limits. Both lungs are clear. IMPRESSION: Negative abdominal radiographs.  No acute cardiopulmonary disease. Electronically Signed   By: Charlett Nose M.D.   On: 04/17/2021 21:15    (Echo, Carotid, EGD, Colonoscopy, ERCP)    Subjective:  Patient resting in bed awake alert denies any nausea vomiting no overnight events able to tolerate p.o. intake Discharge Exam: Vitals:   04/20/21 2101 04/21/21 0710  BP: 116/71 108/61  Pulse: 94 95  Resp: 16 16  Temp: 98.9 F (37.2 C) 98.3 F (36.8 C)  SpO2: 100% 100%   Vitals:   04/20/21 0550 04/20/21 1249 04/20/21 2101 04/21/21 0710  BP: 108/69 109/64 116/71 108/61  Pulse: 77 91 94 95  Resp: 16 18 16 16   Temp: 98 F (36.7 C) 98.5 F (36.9 C) 98.9 F (37.2 C) 98.3 F (36.8 C)  TempSrc:  Oral Oral Oral  SpO2: 100% 99% 100% 100%  Weight:      Height:        General: Pt is alert, awake, not in acute distress Cardiovascular: RRR, S1/S2 +, no rubs, no gallops Respiratory: CTA bilaterally, no wheezing, no rhonchi Abdominal: Soft, NT, ND, bowel sounds + Extremities: no edema, no cyanosis    The results of significant diagnostics from this hospitalization (including imaging, microbiology, ancillary and laboratory) are listed below for reference.     Microbiology: Recent Results (from  the past 240 hour(s))  Resp Panel by RT-PCR (Flu A&B, Covid) Nasopharyngeal Swab     Status: None   Collection Time: 04/17/21 10:05 PM   Specimen: Nasopharyngeal Swab; Nasopharyngeal(NP) swabs in vial transport medium  Result Value Ref Range Status   SARS Coronavirus 2 by RT PCR NEGATIVE NEGATIVE Final    Comment: (NOTE) SARS-CoV-2 target nucleic acids are NOT DETECTED.  The SARS-CoV-2 RNA is generally detectable in upper respiratory specimens during the acute phase of infection. The lowest concentration of SARS-CoV-2 viral copies this assay can detect  is 138 copies/mL. A negative result does not preclude SARS-Cov-2 infection and should not be used as the sole basis for treatment or other patient management decisions. A negative result may occur with  improper specimen collection/handling, submission of specimen other than nasopharyngeal swab, presence of viral mutation(s) within the areas targeted by this assay, and inadequate number of viral copies(<138 copies/mL). A negative result must be combined with clinical observations, patient history, and epidemiological information. The expected result is Negative.  Fact Sheet for Patients:  BloggerCourse.com  Fact Sheet for Healthcare Providers:  SeriousBroker.it  This test is no t yet approved or cleared by the [place] FDA and  has been authorized for detection and/or diagnosis of SARS-CoV-2 by FDA under an Emergency Use Authorization (EUA). This EUA will remain  in effect (meaning this test can be used) for the duration of the COVID-19 declaration under Section 564(b)(1) of the Act, 21 U.S.C.section 360bbb-3(b)(1), unless the authorization is terminated  or revoked sooner.       Influenza A by PCR NEGATIVE NEGATIVE Final   Influenza B by PCR NEGATIVE NEGATIVE Final    Comment: (NOTE) The Xpert Xpress SARS-CoV-2/FLU/RSV plus assay is intended as an aid in the diagnosis of influenza from Nasopharyngeal swab specimens and should not be used as a sole basis for treatment. Nasal washings and aspirates are unacceptable for Xpert Xpress SARS-CoV-2/FLU/RSV testing.  Fact Sheet for Patients: BloggerCourse.com  Fact Sheet for Healthcare Providers: SeriousBroker.it  This test is not yet approved or cleared by the [place] FDA and has been authorized for detection and/or diagnosis of SARS-CoV-2 by FDA under an Emergency Use Authorization (EUA). This EUA will remain in effect  (meaning this test can be used) for the duration of the COVID-19 declaration under Section 564(b)(1) of the Act, 21 U.S.C. section 360bbb-3(b)(1), unless the authorization is terminated or revoked.  Performed at [facility], [address] [place], [place] [address]      Labs: BNP (last 3 results) No results for input(s): BNP in the last 8760 hours. Basic Metabolic Panel: Recent Labs  Lab 04/17/21 1948 04/18/21 0318 04/19/21 0306 04/19/21 1236 04/20/21 0222 04/21/21 0331  NA 139 140 139  --  136 137  K 3.8 3.3* 3.1*  --  3.7 3.9  CL 108 108 106  --  102 102  CO2 20* 24 25  --  27 29  GLUCOSE 100* 104* 66*  --  91 103*  BUN 13 12 <5*  --  6 8  CREATININE 0.71 0.69  0.64 0.72  --  0.64 0.89  CALCIUM 8.8* 9.2 9.2  --  9.5 8.9  MG 1.6*  --  1.5* 1.6* 1.5* 1.9   Liver Function Tests: Recent Labs  Lab 04/17/21 1948 04/18/21 0318  AST 19 18  ALT 11 11  ALKPHOS 48 43  BILITOT 0.8 0.9  PROT 7.2 6.8  ALBUMIN 3.8 3.5   Recent  Labs  Lab 04/17/21 1948  LIPASE 21   No results for input(s): AMMONIA in the last 168 hours. CBC: Recent Labs  Lab 04/17/21 1820 04/18/21 0318 04/19/21 0306  WBC 7.8 7.6 4.9  HGB 12.1 10.2* 11.0*  HCT 37.6 33.0* 35.5*  MCV 86.0 89.4 89.9  PLT 358 376 329   Cardiac Enzymes: No results for input(s): CKTOTAL, CKMB, CKMBINDEX, TROPONINI in the last 168 hours. BNP: Invalid input(s): POCBNP CBG: No results for input(s): GLUCAP in the last 168 hours. D-Dimer No results for input(s): DDIMER in the last 72 hours. Hgb A1c No results for input(s): HGBA1C in the last 72 hours. Lipid Profile No results for input(s): CHOL, HDL, LDLCALC, TRIG, CHOLHDL, LDLDIRECT in the last 72 hours. Thyroid function studies No results for input(s): TSH, T4TOTAL, T3FREE, THYROIDAB in the last 72 hours.  Invalid input(s): FREET3 Anemia work up No results for input(s): VITAMINB12, FOLATE, FERRITIN, TIBC, IRON, RETICCTPCT in the last 72  hours. Urinalysis    Component Value Date/Time   COLORURINE YELLOW 04/17/2021 1905   APPEARANCEUR CLEAR 04/17/2021 1905   LABSPEC 1.020 04/17/2021 1905   PHURINE 6.0 04/17/2021 1905   GLUCOSEU NEGATIVE 04/17/2021 1905   HGBUR NEGATIVE 04/17/2021 1905   BILIRUBINUR NEGATIVE 04/17/2021 1905   KETONESUR 80 (A) 04/17/2021 1905   PROTEINUR NEGATIVE 04/17/2021 1905   NITRITE NEGATIVE 04/17/2021 1905   LEUKOCYTESUR NEGATIVE 04/17/2021 1905   Sepsis Labs Invalid input(s): PROCALCITONIN,  WBC,  LACTICIDVEN Microbiology Recent Results (from the past 240 hour(s))  Resp Panel by RT-PCR (Flu A&B, Covid) Nasopharyngeal Swab     Status: None   Collection Time: 04/17/21 10:05 PM   Specimen: Nasopharyngeal Swab; Nasopharyngeal(NP) swabs in vial transport medium  Result Value Ref Range Status   SARS Coronavirus 2 by RT PCR NEGATIVE NEGATIVE Final    Comment: (NOTE) SARS-CoV-2 target nucleic acids are NOT DETECTED.  The SARS-CoV-2 RNA is generally detectable in upper respiratory specimens during the acute phase of infection. The lowest concentration of SARS-CoV-2 viral copies this assay can detect is 138 copies/mL. A negative result does not preclude SARS-Cov-2 infection and should not be used as the sole basis for treatment or other patient management decisions. A negative result may occur with  improper specimen collection/handling, submission of specimen other than nasopharyngeal swab, presence of viral mutation(s) within the areas targeted by this assay, and inadequate number of viral copies(<138 copies/mL). A negative result must be combined with clinical observations, patient history, and epidemiological information. The expected result is Negative.  Fact Sheet for Patients:  BloggerCourse.comhttps://www.fda.gov/media/152166/download  Fact Sheet for Healthcare Providers:  SeriousBroker.ithttps://www.fda.gov/media/152162/download  This test is no t yet approved or cleared by the [place] FDA and  has been  authorized for detection and/or diagnosis of SARS-CoV-2 by FDA under an Emergency Use Authorization (EUA). This EUA will remain  in effect (meaning this test can be used) for the duration of the COVID-19 declaration under Section 564(b)(1) of the Act, 21 U.S.C.section 360bbb-3(b)(1), unless the authorization is terminated  or revoked sooner.       Influenza A by PCR NEGATIVE NEGATIVE Final   Influenza B by PCR NEGATIVE NEGATIVE Final    Comment: (NOTE) The Xpert Xpress SARS-CoV-2/FLU/RSV plus assay is intended as an aid in the diagnosis of influenza from Nasopharyngeal swab specimens and should not be used as a sole basis for treatment. Nasal washings and aspirates are unacceptable for Xpert Xpress SARS-CoV-2/FLU/RSV testing.  Fact Sheet for Patients: BloggerCourse.comhttps://www.fda.gov/media/152166/download  Fact Sheet for Healthcare  Providers: SeriousBroker.it  This test is not yet approved or cleared by the [place] and has been authorized for detection and/or diagnosis of SARS-CoV-2 by FDA under an Emergency Use Authorization (EUA). This EUA will remain in effect (meaning this test can be used) for the duration of the COVID-19 declaration under Section 564(b)(1) of the Act, 21 U.S.C. section 360bbb-3(b)(1), unless the authorization is terminated or revoked.  Performed at [facility], [address] [place], [place] [address]      Time coordinating discharge:  39 minutes  SIGNED:   Alwyn Ren, MD  [facility] 04/21/2021, 10:02 AM

## 2021-05-04 ENCOUNTER — Encounter (HOSPITAL_COMMUNITY): Payer: Self-pay

## 2021-05-04 ENCOUNTER — Emergency Department (HOSPITAL_COMMUNITY): Payer: 59

## 2021-05-04 ENCOUNTER — Other Ambulatory Visit: Payer: Self-pay

## 2021-05-04 ENCOUNTER — Observation Stay (HOSPITAL_COMMUNITY)
Admission: EM | Admit: 2021-05-04 | Discharge: 2021-05-06 | Disposition: A | Discharge status: Home / Self Care | Payer: 59 | Attending: Internal Medicine | Admitting: Internal Medicine

## 2021-05-04 DIAGNOSIS — E876 Hypokalemia: Secondary | ICD-10-CM

## 2021-05-04 DIAGNOSIS — N856 Intrauterine synechiae: Secondary | ICD-10-CM

## 2021-05-04 DIAGNOSIS — Z79899 Other long term (current) drug therapy: Secondary | ICD-10-CM | POA: Diagnosis not present

## 2021-05-04 DIAGNOSIS — R112 Nausea with vomiting, unspecified: Secondary | ICD-10-CM | POA: Diagnosis present

## 2021-05-04 DIAGNOSIS — E441 Mild protein-calorie malnutrition: Secondary | ICD-10-CM | POA: Diagnosis not present

## 2021-05-04 DIAGNOSIS — Z20822 Contact with and (suspected) exposure to covid-19: Secondary | ICD-10-CM | POA: Insufficient documentation

## 2021-05-04 DIAGNOSIS — F419 Anxiety disorder, unspecified: Secondary | ICD-10-CM | POA: Diagnosis present

## 2021-05-04 DIAGNOSIS — K599 Functional intestinal disorder, unspecified: Secondary | ICD-10-CM | POA: Diagnosis not present

## 2021-05-04 DIAGNOSIS — E039 Hypothyroidism, unspecified: Secondary | ICD-10-CM | POA: Insufficient documentation

## 2021-05-04 DIAGNOSIS — K5090 Crohn's disease, unspecified, without complications: Secondary | ICD-10-CM | POA: Insufficient documentation

## 2021-05-04 DIAGNOSIS — E43 Unspecified severe protein-calorie malnutrition: Secondary | ICD-10-CM | POA: Diagnosis present

## 2021-05-04 DIAGNOSIS — R1110 Vomiting, unspecified: Secondary | ICD-10-CM | POA: Insufficient documentation

## 2021-05-04 DIAGNOSIS — R109 Unspecified abdominal pain: Secondary | ICD-10-CM | POA: Diagnosis present

## 2021-05-04 DIAGNOSIS — G8929 Other chronic pain: Secondary | ICD-10-CM | POA: Diagnosis present

## 2021-05-04 DIAGNOSIS — K529 Noninfective gastroenteritis and colitis, unspecified: Secondary | ICD-10-CM | POA: Insufficient documentation

## 2021-05-04 LAB — URINALYSIS, ROUTINE W REFLEX MICROSCOPIC
Bacteria, UA: NONE SEEN
Bilirubin Urine: NEGATIVE
Glucose, UA: NEGATIVE mg/dL
Hgb urine dipstick: NEGATIVE
Ketones, ur: NEGATIVE mg/dL
Nitrite: POSITIVE — AB
Protein, ur: NEGATIVE mg/dL
Specific Gravity, Urine: 1.045 — ABNORMAL HIGH (ref 1.005–1.030)
pH: 6 (ref 5.0–8.0)

## 2021-05-04 LAB — COMPREHENSIVE METABOLIC PANEL
ALT: 16 U/L (ref 0–44)
AST: 18 U/L (ref 15–41)
Albumin: 4 g/dL (ref 3.5–5.0)
Alkaline Phosphatase: 64 U/L (ref 38–126)
Anion gap: 10 (ref 5–15)
BUN: 10 mg/dL (ref 6–20)
CO2: 25 mmol/L (ref 22–32)
Calcium: 9.3 mg/dL (ref 8.9–10.3)
Chloride: 108 mmol/L (ref 98–111)
Creatinine, Ser: 0.93 mg/dL (ref 0.44–1.00)
GFR, Estimated: >60 mL/min (ref >60)
Glucose, Bld: 102 mg/dL — ABNORMAL HIGH (ref 70–99)
Potassium: 3.2 mmol/L — ABNORMAL LOW (ref 3.5–5.1)
Sodium: 143 mmol/L (ref 135–145)
Total Bilirubin: 0.1 mg/dL — ABNORMAL LOW (ref 0.3–1.2)
Total Protein: 8.3 g/dL — ABNORMAL HIGH (ref 6.5–8.1)

## 2021-05-04 LAB — CBC WITH DIFFERENTIAL/PLATELET
Abs Immature Granulocytes: 0.02 10*3/uL (ref 0.00–0.07)
Basophils Absolute: 0 10*3/uL (ref 0.0–0.1)
Basophils Relative: 1 %
Eosinophils Absolute: 0 10*3/uL (ref 0.0–0.5)
Eosinophils Relative: 0 %
HCT: 36.8 % (ref 36.0–46.0)
Hemoglobin: 11.7 g/dL — ABNORMAL LOW (ref 12.0–15.0)
Immature Granulocytes: 0 %
Lymphocytes Relative: 37 %
Lymphs Abs: 2.6 10*3/uL (ref 0.7–4.0)
MCH: 27.2 pg (ref 26.0–34.0)
MCHC: 31.8 g/dL (ref 30.0–36.0)
MCV: 85.6 fL (ref 80.0–100.0)
Monocytes Absolute: 0.7 10*3/uL (ref 0.1–1.0)
Monocytes Relative: 10 %
Neutro Abs: 3.6 10*3/uL (ref 1.7–7.7)
Neutrophils Relative %: 52 %
Platelets: 508 10*3/uL — ABNORMAL HIGH (ref 150–400)
RBC: 4.3 MIL/uL (ref 3.87–5.11)
RDW: 15.9 % — ABNORMAL HIGH (ref 11.5–15.5)
WBC: 6.9 10*3/uL (ref 4.0–10.5)
nRBC: 0 % (ref 0.0–0.2)

## 2021-05-04 LAB — RAPID URINE DRUG SCREEN, HOSP PERFORMED
Amphetamines: NOT DETECTED
Barbiturates: NOT DETECTED
Benzodiazepines: POSITIVE — AB
Cocaine: NOT DETECTED
Opiates: POSITIVE — AB
Tetrahydrocannabinol: NOT DETECTED

## 2021-05-04 LAB — RESP PANEL BY RT-PCR (FLU A&B, COVID) ARPGX2
Influenza A by PCR: NEGATIVE
Influenza B by PCR: NEGATIVE
SARS Coronavirus 2 by RT PCR: NEGATIVE

## 2021-05-04 LAB — PHOSPHORUS: Phosphorus: 2.9 mg/dL (ref 2.5–4.6)

## 2021-05-04 LAB — I-STAT BETA HCG BLOOD, ED (MC, WL, AP ONLY): I-stat hCG, quantitative: <5 m[IU]/mL (ref <5)

## 2021-05-04 LAB — C-REACTIVE PROTEIN: CRP: 0.6 mg/dL (ref <1.0)

## 2021-05-04 LAB — MAGNESIUM: Magnesium: 2 mg/dL (ref 1.7–2.4)

## 2021-05-04 LAB — T4, FREE: Free T4: 0.81 ng/dL (ref 0.61–1.12)

## 2021-05-04 LAB — LIPASE, BLOOD: Lipase: 30 U/L (ref 11–51)

## 2021-05-04 LAB — SEDIMENTATION RATE: Sed Rate: 15 mm/hr (ref 0–22)

## 2021-05-04 MED ORDER — HYDROMORPHONE HCL 1 MG/ML IJ SOLN
1.0000 mg | Freq: Once | INTRAMUSCULAR | Status: AC
  Administered 2021-05-04: 1 mg via INTRAVENOUS
  Filled 2021-05-04: qty 1

## 2021-05-04 MED ORDER — ALPRAZOLAM 0.5 MG PO TABS
1.0000 mg | ORAL_TABLET | Freq: Two times a day (BID) | ORAL | Status: DC
  Administered 2021-05-04 – 2021-05-06 (×4): 1 mg via ORAL
  Filled 2021-05-04 (×4): qty 2

## 2021-05-04 MED ORDER — LEVOTHYROXINE SODIUM 88 MCG PO TABS
88.0000 ug | ORAL_TABLET | Freq: Every day | ORAL | Status: DC
  Administered 2021-05-05 – 2021-05-06 (×2): 88 ug via ORAL
  Filled 2021-05-04 (×2): qty 1

## 2021-05-04 MED ORDER — ONDANSETRON HCL 4 MG/2ML IJ SOLN
4.0000 mg | Freq: Once | INTRAMUSCULAR | Status: AC
  Administered 2021-05-04: 4 mg via INTRAVENOUS
  Filled 2021-05-04: qty 2

## 2021-05-04 MED ORDER — SODIUM CHLORIDE 0.9 % IV BOLUS
1000.0000 mL | Freq: Once | INTRAVENOUS | Status: AC
  Administered 2021-05-04: 1000 mL via INTRAVENOUS

## 2021-05-04 MED ORDER — CLONAZEPAM 0.5 MG PO TABS
0.5000 mg | ORAL_TABLET | Freq: Every day | ORAL | Status: DC
  Administered 2021-05-05 – 2021-05-06 (×2): 0.5 mg via ORAL
  Filled 2021-05-04 (×2): qty 1

## 2021-05-04 MED ORDER — IOHEXOL 9 MG/ML PO SOLN
500.0000 mL | ORAL | Status: AC
  Administered 2021-05-04: 500 mL via ORAL

## 2021-05-04 MED ORDER — SODIUM CHLORIDE 0.9 % IV SOLN
25.0000 mg | Freq: Four times a day (QID) | INTRAVENOUS | Status: DC | PRN
  Administered 2021-05-04 – 2021-05-06 (×5): 25 mg via INTRAVENOUS
  Filled 2021-05-04 (×2): qty 25
  Filled 2021-05-04: qty 1
  Filled 2021-05-04 (×2): qty 25

## 2021-05-04 MED ORDER — ACETAMINOPHEN 650 MG RE SUPP: 650.0000 mg | Freq: Four times a day (QID) | RECTAL | Status: DC | PRN

## 2021-05-04 MED ORDER — HYDROMORPHONE HCL 1 MG/ML IJ SOLN
0.5000 mg | INTRAMUSCULAR | Status: AC | PRN
  Administered 2021-05-04 – 2021-05-05 (×5): 0.5 mg via INTRAVENOUS
  Filled 2021-05-04 (×6): qty 0.5

## 2021-05-04 MED ORDER — DIPHENHYDRAMINE HCL 50 MG/ML IJ SOLN
25.0000 mg | Freq: Once | INTRAMUSCULAR | Status: AC
  Administered 2021-05-04: 25 mg via INTRAVENOUS
  Filled 2021-05-04: qty 1

## 2021-05-04 MED ORDER — LORAZEPAM 2 MG/ML IJ SOLN
0.5000 mg | Freq: Once | INTRAMUSCULAR | Status: AC
  Administered 2021-05-04: 0.5 mg via INTRAVENOUS
  Filled 2021-05-04: qty 1

## 2021-05-04 MED ORDER — ENSURE ENLIVE PO LIQD: 237.0000 mL | Freq: Two times a day (BID) | ORAL | Status: DC

## 2021-05-04 MED ORDER — DIPHENHYDRAMINE HCL 50 MG/ML IJ SOLN
12.5000 mg | Freq: Three times a day (TID) | INTRAMUSCULAR | Status: DC | PRN
  Administered 2021-05-04 – 2021-05-05 (×3): 12.5 mg via INTRAVENOUS
  Filled 2021-05-04 (×3): qty 1

## 2021-05-04 MED ORDER — ACETAMINOPHEN 325 MG PO TABS: 650.0000 mg | ORAL_TABLET | Freq: Four times a day (QID) | ORAL | Status: DC | PRN

## 2021-05-04 MED ORDER — ENOXAPARIN SODIUM 40 MG/0.4ML IJ SOSY
40.0000 mg | PREFILLED_SYRINGE | INTRAMUSCULAR | Status: DC
  Administered 2021-05-04 – 2021-05-05 (×2): 40 mg via SUBCUTANEOUS
  Filled 2021-05-04 (×2): qty 0.4

## 2021-05-04 MED ORDER — GABAPENTIN 100 MG PO CAPS
200.0000 mg | ORAL_CAPSULE | Freq: Every day | ORAL | Status: DC
  Administered 2021-05-05 – 2021-05-06 (×2): 200 mg via ORAL
  Filled 2021-05-04 (×2): qty 2

## 2021-05-04 MED ORDER — ZOLPIDEM TARTRATE 5 MG PO TABS
10.0000 mg | ORAL_TABLET | Freq: Every day | ORAL | Status: DC
  Administered 2021-05-04 – 2021-05-05 (×2): 10 mg via ORAL
  Filled 2021-05-04 (×2): qty 2

## 2021-05-04 MED ORDER — POTASSIUM CHLORIDE 10 MEQ/100ML IV SOLN
10.0000 meq | Freq: Once | INTRAVENOUS | Status: AC
  Administered 2021-05-04: 10 meq via INTRAVENOUS
  Filled 2021-05-04: qty 100

## 2021-05-04 MED ORDER — SACCHAROMYCES BOULARDII 250 MG PO CAPS
250.0000 mg | ORAL_CAPSULE | Freq: Two times a day (BID) | ORAL | Status: DC
  Administered 2021-05-04 – 2021-05-06 (×4): 250 mg via ORAL
  Filled 2021-05-04 (×4): qty 1

## 2021-05-04 MED ORDER — POTASSIUM CHLORIDE CRYS ER 20 MEQ PO TBCR: 40.0000 meq | EXTENDED_RELEASE_TABLET | Freq: Once | ORAL | Status: DC

## 2021-05-04 MED ORDER — SODIUM CHLORIDE 0.9 % IV SOLN: INTRAVENOUS | Status: DC

## 2021-05-04 MED ORDER — FLUOXETINE HCL 20 MG PO CAPS
60.0000 mg | ORAL_CAPSULE | Freq: Every morning | ORAL | Status: DC
  Administered 2021-05-05 – 2021-05-06 (×2): 60 mg via ORAL
  Filled 2021-05-04 (×2): qty 3

## 2021-05-04 MED ORDER — DIPHENHYDRAMINE HCL 50 MG/ML IJ SOLN
50.0000 mg | Freq: Once | INTRAMUSCULAR | Status: AC
  Administered 2021-05-04: 50 mg via INTRAVENOUS
  Filled 2021-05-04: qty 1

## 2021-05-04 MED ORDER — IOHEXOL 300 MG/ML  SOLN
100.0000 mL | Freq: Once | INTRAMUSCULAR | Status: AC | PRN
  Administered 2021-05-04: 75 mL via INTRAVENOUS

## 2021-05-04 MED ORDER — OXYCODONE HCL 5 MG PO TABS
10.0000 mg | ORAL_TABLET | ORAL | Status: DC | PRN
  Administered 2021-05-05 – 2021-05-06 (×3): 10 mg via ORAL
  Filled 2021-05-04 (×4): qty 2

## 2021-05-04 MED ORDER — ONDANSETRON HCL 4 MG PO TABS: 4.0000 mg | ORAL_TABLET | Freq: Four times a day (QID) | ORAL | Status: DC | PRN

## 2021-05-04 MED ORDER — ONDANSETRON HCL 4 MG/2ML IJ SOLN: 4.0000 mg | Freq: Four times a day (QID) | INTRAMUSCULAR | Status: DC | PRN

## 2021-05-04 MED ORDER — IOHEXOL 9 MG/ML PO SOLN
ORAL | Status: AC
  Administered 2021-05-04: 500 mL via ORAL
  Filled 2021-05-04: qty 1000

## 2021-05-04 NOTE — Plan of Care (Signed)

## 2021-05-04 NOTE — H&P (Addendum)
History and Physical    Kimberly Valencia VQQ:[id number] DOB: [birth year] DOA: 05/04/2021  PCP: Pcp, No (Confirm with patient/family/NH records and if not entered, this has to be entered at [facility] point of entry) Patient coming from: Home  I have personally briefly reviewed patient's old medical records in [place]  Chief Complaint: Nauseous vomit, abdominal pain and diarrhea.  HPI: Kimberly Valencia is a 35 y.o. female with medical history significant of Crohn's disease off medications, Asherman's syndrome, recent C. difficile colitis status post Dific[facility], anxiety depression, presented with recurrent GI symptoms including nauseous vomit diarrhea cramping-like abdominal pain.  Symptoms started 2 days ago, similar presentation of cramping-like abdominal pain on the right lower quadrant, nauseous and vomiting of stomach content, loose bowel movement 5-7 times a day and last bowel movement was this morning, nonbloody, no mucus coating.  Denied any tenesmus, no fever or chills.  Oral intake significantly decreased due to recurrent nauseous and vomiting feeling. 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  No white count afebrile.  Pregnancy test negative  Review of Systems: As per HPI otherwise 14 point review of systems negative.    Past Medical History:  Diagnosis Date  . Asherman syndrome     Past Surgical History:  Procedure Laterality Date  . TONSILLECTOMY       reports that she has never smoked. She has never used smokeless tobacco. She reports that she does not drink alcohol and does not use drugs.  Allergies  Allergen Reactions  . Trazodone     Other reaction(s): Other  . Bentyl [Dicyclomine Hcl]   . Compazine [Prochlorperazine]   . Reglan [Metoclopramide]   . Sulfa Antibiotics     History reviewed. No pertinent family history.   Prior to Admission medications   Medication Sig Start  Date End Date Taking? Authorizing Provider  ALPRAZolam Duanne Moron) 1 MG tablet Take 1 tablet (1 mg total) by mouth 2 (two) times daily. 04/21/21   Georgette Shell, MD  clonazePAM (KLONOPIN) 0.5 MG tablet Take 0.5 mg by mouth daily.    [provider]  FLUoxetine (PROZAC) 20 MG capsule Take 60 mg by mouth every morning. 03/30/21   [provider]  gabapentin (NEURONTIN) 100 MG capsule Take 200 mg by mouth daily. 03/15/21   [provider]  levothyroxine (SYNTHROID) 88 MCG tablet Take 88 mcg by mouth daily before breakfast.    [provider]  Multiple Vitamin (MULTIVITAMIN WITH MINERALS) TABS tablet Take 1 tablet by mouth daily. 04/22/21   Georgette Shell, MD  ondansetron (ZOFRAN ODT) 4 MG disintegrating tablet Take 1 tablet (4 mg total) by mouth every 8 (eight) hours as needed for nausea or vomiting. 04/11/21   Eugenie Filler, MD  Oxycodone HCl 10 MG TABS Take 10 mg by mouth every 4 (four) hours as needed for pain. 04/01/21   [provider]  saccharomyces boulardii (FLORASTOR) 250 MG capsule Take 1 capsule (250 mg total) by mouth 2 (two) times daily. 04/21/21   Georgette Shell, MD  zolpidem (AMBIEN) 10 MG tablet Take 10 mg by mouth at bedtime. 03/20/21   [provider]    Physical Exam: Vitals:   05/04/21 1100 05/04/21 1130 05/04/21 1240 05/04/21 1330  BP: 116/90 116/90 118/64 108/81  Pulse: 84 84 96 86  Resp:  '16 16 16  ' Temp:      TempSrc:  SpO2: 96% 96% 97% 98%    Constitutional: NAD, calm, comfortable Vitals:   05/04/21 1100 05/04/21 1130 05/04/21 1240 05/04/21 1330  BP: 116/90 116/90 118/64 108/81  Pulse: 84 84 96 86  Resp:  '16 16 16  ' Temp:      TempSrc:      SpO2: 96% 96% 97% 98%   Eyes: PERRL, lids and conjunctivae normal ENMT: Mucous membranes are dry. Posterior pharynx clear of any exudate or lesions.Normal dentition.  Neck: normal, supple, no masses, no thyromegaly Respiratory: clear to auscultation  bilaterally, no wheezing, no crackles. Normal respiratory effort. No accessory muscle use.  Cardiovascular: Regular rate and rhythm, no murmurs / rubs / gallops. No extremity edema. 2+ pedal pulses. No carotid bruits.  Abdomen: mild tenderness on RLQ, no rebound no guarding, no masses palpated. No hepatosplenomegaly. Bowel sounds positive.  Musculoskeletal: no clubbing / cyanosis. No joint deformity upper and lower extremities. Good ROM, no contractures. Normal muscle tone.  Skin: no rashes, lesions, ulcers. No induration Neurologic: CN 2-12 grossly intact. Sensation intact, DTR normal. Strength 5/5 in all 4.  Psychiatric: Normal judgment and insight. Alert and oriented x 3. Normal mood.     Labs on Admission: I have personally reviewed following labs and imaging studies  CBC: Recent Labs  Lab 05/04/21 0736  WBC 6.9  NEUTROABS 3.6  HGB 11.7*  HCT 36.8  MCV 85.6  PLT 595*   Basic Metabolic Panel: Recent Labs  Lab 05/04/21 0736  NA 143  K 3.2*  CL 108  CO2 25  GLUCOSE 102*  BUN 10  CREATININE 0.93  CALCIUM 9.3   GFR: CrCl cannot be calculated (Unknown ideal weight.). Liver Function Tests: Recent Labs  Lab 05/04/21 0736  AST 18  ALT 16  ALKPHOS 64  BILITOT 0.1*  PROT 8.3*  ALBUMIN 4.0   Recent Labs  Lab 05/04/21 0748  LIPASE 30   No results for input(s): AMMONIA in the last 168 hours. Coagulation Profile: No results for input(s): INR, PROTIME in the last 168 hours. Cardiac Enzymes: No results for input(s): CKTOTAL, CKMB, CKMBINDEX, TROPONINI in the last 168 hours. BNP (last 3 results) No results for input(s): PROBNP in the last 8760 hours. HbA1C: No results for input(s): HGBA1C in the last 72 hours. CBG: No results for input(s): GLUCAP in the last 168 hours. Lipid Profile: No results for input(s): CHOL, HDL, LDLCALC, TRIG, CHOLHDL, LDLDIRECT in the last 72 hours. Thyroid Function Tests: No results for input(s): TSH, T4TOTAL, FREET4, T3FREE, THYROIDAB  in the last 72 hours. Anemia Panel: No results for input(s): VITAMINB12, FOLATE, FERRITIN, TIBC, IRON, RETICCTPCT in the last 72 hours. Urine analysis:    Component Value Date/Time   COLORURINE YELLOW 04/17/2021 1905   APPEARANCEUR CLEAR 04/17/2021 1905   LABSPEC 1.020 04/17/2021 1905   PHURINE 6.0 04/17/2021 1905   GLUCOSEU NEGATIVE 04/17/2021 1905   HGBUR NEGATIVE 04/17/2021 [place] NEGATIVE 04/17/2021 1905   KETONESUR 80 (A) 04/17/2021 1905   PROTEINUR NEGATIVE 04/17/2021 1905   NITRITE NEGATIVE 04/17/2021 1905   LEUKOCYTESUR NEGATIVE 04/17/2021 1905    Radiological Exams on Admission: CT ABDOMEN PELVIS W CONTRAST  Result Date: 05/04/2021 CLINICAL DATA:  35 year old female with abdominal pain, history of Crohn's. EXAM: CT ABDOMEN AND PELVIS WITH CONTRAST TECHNIQUE: Multidetector CT imaging of the abdomen and pelvis was performed using the standard protocol following bolus administration of intravenous contrast. CONTRAST:  72m OMNIPAQUE IOHEXOL 300 MG/ML  SOLN COMPARISON:  04/08/2021 FINDINGS: Lower chest: No acute  abnormality. Hepatobiliary: No focal liver abnormality is seen. No gallstones, gallbladder wall thickening, or biliary dilatation. Pancreas: Unremarkable. No pancreatic ductal dilatation or surrounding inflammatory changes. Spleen: Normal in size without focal abnormality. Adrenals/Urinary Tract: Adrenal glands are unremarkable. Kidneys are normal, without renal calculi, focal lesion, or hydronephrosis. Bladder is unremarkable. Stomach/Bowel: Stomach is within normal limits. Possible stricturing versus peristalsis of the approximately 6 cm of the terminal ileum. No evidence of fistulization or bowel wall thickening. Appendix is not definitively visualized. Vascular/Lymphatic: No significant vascular findings are present. No enlarged abdominal or pelvic lymph nodes. Reproductive: Uterus and bilateral adnexa are unremarkable. Other: No abdominal wall hernia or abnormality.  No abdominopelvic ascites. Musculoskeletal: No acute or significant osseous findings. IMPRESSION: No CT evidence of acute changes associated with Crohn's disease or additional acute abdominopelvic abnormality. Possible nonobstructive stricturing of the terminal ileum as could be seen with chronic changes of Crohn's disease. Electronically Signed   By: Ruthann Cancer MD   On: 05/04/2021 11:47    EKG: NOne  Assessment/Plan Active Problems:   Intractable nausea and vomiting   Gastroenteritis  (please populate well all problems here in Problem List. (For example, if patient is on BP meds at home and you resume or decide to hold them, it is a problem that needs to be her. Same for CAD, COPD, HLD and so on)  Acute viral gastroenteritis, likely -Doubt Crohn's disease flareup, will send ESR and CRP. -Signs of volume depletion, start IV fluid and trial of clear liquid diet for tonight and advance if tolerated. -Placed on hospital observation overnight. -C. difficile study pending. -Send UDS, patient denied marijuana use. -Also there is a question about drug-seeking behavior, discussed with ED physician and care everywhere showed the patient has been in and out ED in several states this year.  Discontinue IV narcotics.  Hypokalemia -Leave from acute GI loss, IV and p.o. replacement, check magnesium and phosphorus level  Hx of Crohn's disease -Patient not on any immune modulation due to concern about contract infections as her job is a Neurosurgeon under travel, and she said Entocort did not work for her very well.  And prednisone taper too much side effect. -ESR, CRP  Severe protein calorie malnutrition -Start Ensure  Depression anxiety -Continue SSRI  Hypothyroidism -TSH borderline low last time, will check T4  DVT prophylaxis: Lovenox code Status: Full code Family Communication: None at bedside Disposition Plan: Expect less than 2 midnight hospital stay Consults called:  None Admission status: MedSurg observation   Lequita Halt MD [facility] Pager [phone]  05/04/2021, 2:19 PM

## 2021-05-04 NOTE — ED Triage Notes (Signed)
Pt c/o abdominal pain, N/V/D since yesterday. States has been treated for C-diff several weeks ago. Hx of Crohn's.

## 2021-05-04 NOTE — ED Provider Notes (Signed)
[facility]-EMERGENCY DEPT Provider Note   CSN: [id number] Arrival date & time: 05/04/21  0703     History Chief Complaint  Patient presents with  . Abdominal Pain  . Emesis  . Nausea  . Diarrhea    Kimberly Valencia is a 35 y.o. female. Not in room on my first attempt to see HPI 35 year old female G2, P0, status post D&C 1 month ago, history of Crohn's, Asherman syndrome, with recent multiple hospitalizations, most recently discharged from [facility] April 28 after admission for abdominal pain, diarrhea, and dehydration.  Presents today complaining of nausea, vomiting, diarrhea for the past 24 hours.  She states that she has had up to 10 loose stools and 3 episodes of vomiting.  She denies blood or dark tarry stools.  She has not had fever.  She has been unable to keep her medications down.  She was positive for C. difficile during her last hospitalization at [facility] and treated with antibiotics.  She states that she was somewhat better for her discharge to [facility] until her admission to [facility].  She has been improved over the past week since discharge from [facility] until yesterday when symptoms began again.  She reports no vaginal discharge or urinary tract infection symptoms.  She is scheduled for hysterectomy next month.  She is not currently undergoing any specific treatment for her Crohn's.  She states that she was on immunosuppressants in the past but had stopped these due to nursing.  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  She reports that she has received 2 Moderna vaccines but no booster.    Past Medical History:  Diagnosis Date  . Asherman syndrome     Patient Active Problem List   Diagnosis Date Noted  . Protein-calorie malnutrition, severe 04/18/2021  . Chronic abdominal pain   . Nausea vomiting and diarrhea   . Anxiety   . C. difficile diarrhea 04/09/2021  . Intractable nausea and vomiting 04/08/2021  . Chronic pain 04/08/2021  .  Crohn disease (HCC) 04/08/2021  . C. difficile colitis 04/08/2021  . AKI (acute kidney injury) (HCC) 04/08/2021  . Hypokalemia 04/08/2021    Past Surgical History:  Procedure Laterality Date  . TONSILLECTOMY       OB History   No obstetric history on file.     History reviewed. No pertinent family history.  Social History   Tobacco Use  . Smoking status: Never Smoker  . Smokeless tobacco: Never Used  Substance Use Topics  . Alcohol use: Never  . Drug use: Never    Home Medications Prior to Admission medications   Medication Sig Start Date End Date Taking? Authorizing Provider  ALPRAZolam Prudy Feeler) 1 MG tablet Take 1 tablet (1 mg total) by mouth 2 (two) times daily. 04/21/21   Alwyn Ren, MD  clonazePAM (KLONOPIN) 0.5 MG tablet Take 0.5 mg by mouth daily.    [provider]  FLUoxetine (PROZAC) 20 MG capsule Take 60 mg by mouth every morning. 03/30/21   [provider]  gabapentin (NEURONTIN) 100 MG capsule Take 200 mg by mouth daily. 03/15/21   [provider]  levothyroxine (SYNTHROID) 88 MCG tablet Take 88 mcg by mouth daily before breakfast.    [provider]  Multiple Vitamin (MULTIVITAMIN WITH MINERALS) TABS tablet Take 1 tablet by mouth daily. 04/22/21   Alwyn Ren, MD  ondansetron (ZOFRAN ODT) 4 MG disintegrating tablet Take 1 tablet (4 mg total) by mouth every  8 (eight) hours as needed for nausea or vomiting. 04/11/21   Rodolph Bong, MD  Oxycodone HCl 10 MG TABS Take 10 mg by mouth every 4 (four) hours as needed for pain. 04/01/21   [provider]  saccharomyces boulardii (FLORASTOR) 250 MG capsule Take 1 capsule (250 mg total) by mouth 2 (two) times daily. 04/21/21   Alwyn Ren, MD  zolpidem (AMBIEN) 10 MG tablet Take 10 mg by mouth at bedtime. 03/20/21   [provider]    Allergies    Trazodone, Bentyl [dicyclomine hcl], Compazine [prochlorperazine], Reglan [metoclopramide], and  Sulfa antibiotics  Review of Systems   Review of Systems  All other systems reviewed and are negative.   Physical Exam Updated Vital Signs BP 131/75 (BP Location: Right Arm)   Pulse (!) 107   Temp 98.4 F (36.9 C) (Oral)   Resp 16   LMP  (LMP Unknown) Comment: ashermans disease,neg preg test  SpO2 100%   Physical Exam Vitals and nursing note reviewed.  Constitutional:      Appearance: She is well-developed.  HENT:     Head: Normocephalic and atraumatic.  Eyes:     Extraocular Movements: Extraocular movements intact.  Cardiovascular:     Rate and Rhythm: Tachycardia present.     Heart sounds: Normal heart sounds.  Pulmonary:     Effort: Pulmonary effort is normal.     Breath sounds: Normal breath sounds.  Abdominal:     General: Abdomen is flat. Bowel sounds are normal.     Palpations: Abdomen is soft.     Tenderness: There is no abdominal tenderness.     Comments: Mild diffuse ttp  Skin:    General: Skin is warm and dry.  Neurological:     General: No focal deficit present.     Mental Status: She is alert.  Psychiatric:        Mood and Affect: Mood normal.        Behavior: Behavior normal.     ED Results / Procedures / Treatments   Labs (all labs ordered are listed, but only abnormal results are displayed) Labs Reviewed - No data to display  EKG None  Radiology No results found.  Procedures Procedures   Medications Ordered in ED Medications - No data to display  ED Course  I have reviewed the triage vital signs and the nursing notes.  Pertinent labs & imaging results that were available during my care of the patient were reviewed by me and considered in my medical decision making (see chart for details).  Clinical Course as of 05/04/21 1332  Sat May 04, 2021  0848 I-STAT pregnancy negative.  Lipase normal.  CBC with mild anemia with hemoglobin 11.7, white blood cell count normal, platelets slightly elevated at 508,000 consistent with  dehydration Potassium slightly decreased at 3.2.  Will give potassium repletion. Vital signs have revealed decrease in heart rate from 1 12-97 with fluids infusing. [DR]  1142 Patient continues to feel nauseated.  She reports vomiting omnipaque.  Heart rate down to 84. Benadryl, phenergan and dilaudid ordered [DR]    Clinical Course User Index [DR] Margarita Grizzle, MD   MDM Rules/Calculators/A&P                         Patient continues to have nausea and vomiting.  CT reviewed and no evidence of acute abnormality.  Labs reviewed and remain within normal limits except for mild hypokalemia and  elevated platelets consistent with volume depletion.  She has had potassium repleted here in the ED.  Due to ongoing vomiting and not tolerating liquids, she will need hospitalization for further IV fluid and potassium replenishment. Discussed with Dr. Chipper Herb who will see for admission Final Clinical Impression(s) / ED Diagnoses Final diagnoses:  Intractable vomiting with nausea, unspecified vomiting type  Hypokalemia  Crohn's disease without complication, unspecified gastrointestinal tract location [facility])    Rx / DC Orders ED Discharge Orders    None       Margarita Grizzle, MD 05/04/21 1336

## 2021-05-05 DIAGNOSIS — F419 Anxiety disorder, unspecified: Secondary | ICD-10-CM | POA: Diagnosis not present

## 2021-05-05 DIAGNOSIS — K50919 Crohn's disease, unspecified, with unspecified complications: Secondary | ICD-10-CM

## 2021-05-05 DIAGNOSIS — R109 Unspecified abdominal pain: Secondary | ICD-10-CM

## 2021-05-05 DIAGNOSIS — G8929 Other chronic pain: Secondary | ICD-10-CM

## 2021-05-05 DIAGNOSIS — N856 Intrauterine synechiae: Secondary | ICD-10-CM

## 2021-05-05 DIAGNOSIS — R112 Nausea with vomiting, unspecified: Secondary | ICD-10-CM | POA: Diagnosis not present

## 2021-05-05 DIAGNOSIS — E43 Unspecified severe protein-calorie malnutrition: Secondary | ICD-10-CM

## 2021-05-05 LAB — BASIC METABOLIC PANEL
Anion gap: 7 (ref 5–15)
BUN: 7 mg/dL (ref 6–20)
CO2: 25 mmol/L (ref 22–32)
Calcium: 8.7 mg/dL — ABNORMAL LOW (ref 8.9–10.3)
Chloride: 106 mmol/L (ref 98–111)
Creatinine, Ser: 0.8 mg/dL (ref 0.44–1.00)
GFR, Estimated: >60 mL/min (ref >60)
Glucose, Bld: 92 mg/dL (ref 70–99)
Potassium: 3.3 mmol/L — ABNORMAL LOW (ref 3.5–5.1)
Sodium: 138 mmol/L (ref 135–145)

## 2021-05-05 LAB — CBC
HCT: 33.7 % — ABNORMAL LOW (ref 36.0–46.0)
Hemoglobin: 10.2 g/dL — ABNORMAL LOW (ref 12.0–15.0)
MCH: 26.9 pg (ref 26.0–34.0)
MCHC: 30.3 g/dL (ref 30.0–36.0)
MCV: 88.9 fL (ref 80.0–100.0)
Platelets: 398 10*3/uL (ref 150–400)
RBC: 3.79 MIL/uL — ABNORMAL LOW (ref 3.87–5.11)
RDW: 15.7 % — ABNORMAL HIGH (ref 11.5–15.5)
WBC: 7.1 10*3/uL (ref 4.0–10.5)
nRBC: 0 % (ref 0.0–0.2)

## 2021-05-05 MED ORDER — POTASSIUM CHLORIDE CRYS ER 20 MEQ PO TBCR
40.0000 meq | EXTENDED_RELEASE_TABLET | ORAL | Status: DC
  Filled 2021-05-05: qty 2

## 2021-05-05 MED ORDER — PANTOPRAZOLE SODIUM 40 MG PO TBEC
40.0000 mg | DELAYED_RELEASE_TABLET | Freq: Every day | ORAL | Status: DC
  Administered 2021-05-05 – 2021-05-06 (×2): 40 mg via ORAL
  Filled 2021-05-05 (×2): qty 1

## 2021-05-05 MED ORDER — POTASSIUM CHLORIDE CRYS ER 10 MEQ PO TBCR
40.0000 meq | EXTENDED_RELEASE_TABLET | ORAL | Status: AC
  Administered 2021-05-05: 40 meq via ORAL
  Filled 2021-05-05: qty 4

## 2021-05-05 MED ORDER — DEXTROSE IN LACTATED RINGERS 5 % IV SOLN: INTRAVENOUS | Status: DC

## 2021-05-05 NOTE — Progress Notes (Signed)
PROGRESS NOTE    Linet Brash  GHW:[id number] DOB: 11-11-1986 DOA: 05/04/2021 PCP: Pcp, No    Brief Narrative:  Ms. Sherrard was admitted to the hospital working diagnosis of intractable nausea vomiting, gastroenteritis.  35 year old female past medical history for Crohn's disease, Sjogren's syndrome, recent C. difficile colitis, anxiety, and depression who presented with nausea, vomiting abdominal pain and diarrhea.  Reported 2 days of persistent symptoms, including severe watery diarrhea, and poor oral intake.  On her initial physical examination blood pressure 116/90, heart rate 84, respiratory rate 16, oxygen saturation 96%, dry mucous membranes, lungs clear to auscultation bilaterally, heart S1-S2, present, rhythmic, soft abdomen, tender to palpation right lower quadrant, no rebound no guarding, no lower extremity edema.  Sodium 143, potassium 3.2, chloride 108, bicarb 25, glucose 102, BUN 10, creatinine 0.93, white count 6.9, hemoglobin 8.7, hematocrit 36.8, platelets 508.  SARS COVID-19 negative.  Urinalysis specific gravity 1.045, 0-5 red cells, 11-20 white cells.  CT of the abdomen and pelvis with no acute changes, possible nonobstructive stricture at the terminal ileum.  Assessment & Plan:   Principal Problem:   Intractable nausea and vomiting Active Problems:   Crohn disease (HCC)   Chronic abdominal pain   Anxiety   Protein-calorie malnutrition, severe   Asherman syndrome   1. Intractable nausea and vomiting. Patient with improvement of her symptoms but not yet back to baseline.   Plan to advance diet to regular, continue with supportive medical therapy with IV fluids (balanced electrolyte solutions with dextrose), antiacids and as needed antiemetics (promethazine). Analgesics with hydromorphone and oxycodone  If continue to improve possible dc home in am.   2. Hypokalemia, and dehydration. Renal function with serum cr at 0,80 with K at 3,3 and serum bicarbonate at  25. Na 138 and Cl 106.  Continue hydration with balanced electrolyte solutions and will add Kcl 40 meq x 2 and follow up on renal function in am.   3. Hx of Crohn's disease. Severe calorie protein malnutrition. No clinical signs of acute exacerbation. Continue supportive medical therapy and will set outpatient follow up.   Continue with nutritional supplements.   4. Depression/ anxiety. Continue with fluoxetine, gabapentin, alprazolam and clonazepam per her home regimen.   5. Hypothyroid. Continue with levothyroxine,   Patient continue to be at high risk for worsening nausea and vomiting.   Status is: Observation  The patient remains OBS appropriate and will d/c before 2 midnights.  Dispo: The patient is from: Home              Anticipated d/c is to: Home              Patient currently is not medically stable to d/c.   Difficult to place patient No   DVT prophylaxis: Enoxaparin   Code Status:   full  Family Communication:  No family at the bedside      Subjective: Patient continue to have nausea and vomiting, mild improvement but not yet back to baseline. No chest pain and no dyspnea.   Objective: Vitals:   05/04/21 2134 05/05/21 0129 05/05/21 0537 05/05/21 1350  BP: (!) 122/92 118/72 126/81 111/78  Pulse: 76 80 78 65  Resp: 16 16 16 16   Temp: 98.8 F (37.1 C) 98.6 F (37 C) 97.9 F (36.6 C) (!) 97.4 F (36.3 C)  TempSrc: Oral Oral Oral Oral  SpO2: 100% 100% 100% 100%    Intake/Output Summary (Last 24 hours) at 05/05/2021 1437 Last data filed at 05/05/2021 [id number]  Gross per 24 hour  Intake 1064 ml  Output --  Net 1064 ml   There were no vitals filed for this visit.  Examination:   General: Not in pain or dyspnea, deconditioned  Neurology: Awake and alert, non focal  E ENT: mild pallor, no icterus, oral mucosa moist Cardiovascular: No JVD. S1-S2 present, rhythmic, no gallops, rubs, or murmurs. No lower extremity edema.  Pulmonary: positive breath sounds  bilaterally, adequate air movement, no wheezing, rhonchi or rales. Gastrointestinal. Abdomen soft , mild tender to deep palpation at the mid abdomen, no rebound or guarding Skin. No rashes Musculoskeletal: no joint deformities     Data Reviewed: I have personally reviewed following labs and imaging studies  CBC: Recent Labs  Lab 05/04/21 0736 05/05/21 0613  WBC 6.9 7.1  NEUTROABS 3.6  --   HGB 11.7* 10.2*  HCT 36.8 33.7*  MCV 85.6 88.9  PLT 508* 398   Basic Metabolic Panel: Recent Labs  Lab 05/04/21 0736 05/04/21 0810 05/05/21 0613  NA 143  --  138  K 3.2*  --  3.3*  CL 108  --  106  CO2 25  --  25  GLUCOSE 102*  --  92  BUN 10  --  7  CREATININE 0.93  --  0.80  CALCIUM 9.3  --  8.7*  MG  --  2.0  --   PHOS  --  2.9  --    GFR: CrCl cannot be calculated (Unknown ideal weight.). Liver Function Tests: Recent Labs  Lab 05/04/21 0736  AST 18  ALT 16  ALKPHOS 64  BILITOT 0.1*  PROT 8.3*  ALBUMIN 4.0   Recent Labs  Lab 05/04/21 0748  LIPASE 30   No results for input(s): AMMONIA in the last 168 hours. Coagulation Profile: No results for input(s): INR, PROTIME in the last 168 hours. Cardiac Enzymes: No results for input(s): CKTOTAL, CKMB, CKMBINDEX, TROPONINI in the last 168 hours. BNP (last 3 results) No results for input(s): PROBNP in the last 8760 hours. HbA1C: No results for input(s): HGBA1C in the last 72 hours. CBG: No results for input(s): GLUCAP in the last 168 hours. Lipid Profile: No results for input(s): CHOL, HDL, LDLCALC, TRIG, CHOLHDL, LDLDIRECT in the last 72 hours. Thyroid Function Tests: Recent Labs    05/04/21 1803  FREET4 0.81   Anemia Panel: No results for input(s): VITAMINB12, FOLATE, FERRITIN, TIBC, IRON, RETICCTPCT in the last 72 hours.    Radiology Studies: I have reviewed all of the imaging during this hospital visit personally     Scheduled Meds: . ALPRAZolam  1 mg Oral BID  . clonazePAM  0.5 mg Oral Daily  .  enoxaparin (LOVENOX) injection  40 mg Subcutaneous Q24H  . feeding supplement  237 mL Oral BID BM  . FLUoxetine  60 mg Oral q morning  . gabapentin  200 mg Oral Daily  . levothyroxine  88 mcg Oral Q0600  . potassium chloride  40 mEq Oral Once  . saccharomyces boulardii  250 mg Oral BID  . zolpidem  10 mg Oral QHS   Continuous Infusions: . promethazine (PHENERGAN) injection (IM or IVPB) 25 mg (05/05/21 0730)     LOS: 0 days        Valeen Borys Annett Gula, MD

## 2021-05-06 DIAGNOSIS — R112 Nausea with vomiting, unspecified: Secondary | ICD-10-CM | POA: Diagnosis not present

## 2021-05-06 DIAGNOSIS — F419 Anxiety disorder, unspecified: Secondary | ICD-10-CM | POA: Diagnosis not present

## 2021-05-06 DIAGNOSIS — R109 Unspecified abdominal pain: Secondary | ICD-10-CM | POA: Diagnosis not present

## 2021-05-06 DIAGNOSIS — N856 Intrauterine synechiae: Secondary | ICD-10-CM | POA: Diagnosis not present

## 2021-05-06 LAB — BASIC METABOLIC PANEL
Anion gap: 7 (ref 5–15)
BUN: 11 mg/dL (ref 6–20)
CO2: 26 mmol/L (ref 22–32)
Calcium: 9.3 mg/dL (ref 8.9–10.3)
Chloride: 103 mmol/L (ref 98–111)
Creatinine, Ser: 0.84 mg/dL (ref 0.44–1.00)
GFR, Estimated: >60 mL/min (ref >60)
Glucose, Bld: 62 mg/dL — ABNORMAL LOW (ref 70–99)
Potassium: 4.5 mmol/L (ref 3.5–5.1)
Sodium: 136 mmol/L (ref 135–145)

## 2021-05-06 LAB — MAGNESIUM: Magnesium: 1.9 mg/dL (ref 1.7–2.4)

## 2021-05-06 MED ORDER — ALPRAZOLAM 1 MG PO TABS: 1.0000 mg | ORAL_TABLET | Freq: Two times a day (BID) | ORAL | 0 refills | Status: AC

## 2021-05-06 MED ORDER — PROMETHAZINE HCL 25 MG PO TABS: 25.0000 mg | ORAL_TABLET | Freq: Four times a day (QID) | ORAL | Status: DC | PRN

## 2021-05-06 NOTE — Discharge Summary (Signed)
Physician Discharge Summary  Kimberly Valencia JOA:[id number] DOB: 03-13-1986 DOA: 05/04/2021  PCP: Pcp, No  Admit date: 05/04/2021 Discharge date: 05/06/2021  Admitted From: Home Disposition:   Home   Recommendations for Outpatient Follow-up and new medication changes:  1. Follow up with Primary care in 7 to 10 days.    Home Health: no   Equipment/Devices: no    Discharge Condition: stable  CODE STATUS: full   Diet recommendation: regular.   Brief/Interim Summary: Ms. [name] was admitted to the hospital with the working diagnosis of intractable nausea vomiting, gastroenteritis.  35 year old female past medical history for Crohn's disease, Asherman's syndrome, recent C. difficile colitis, anxiety, and depression who presented with nausea, vomiting, abdominal pain and diarrhea.  Reported 2 days of persistent symptoms, including severe watery diarrhea, and poor oral intake.  On her initial physical examination blood pressure 116/90, heart rate 84, respiratory rate 16, oxygen saturation 96%, dry mucous membranes, lungs clear to auscultation bilaterally, heart S1-S2, present, rhythmic, soft abdomen, tender to palpation right lower quadrant, no rebound no guarding, no lower extremity edema.  Sodium 143, potassium 3.2, chloride 108, bicarb 25, glucose 102, BUN 10, creatinine 0.93, white count 6.9, hemoglobin 8.7, hematocrit 36.8, platelets 508.  SARS COVID-19 negative.  Urinalysis specific gravity 1.045, 0-5 red cells, 11-20 white cells.  CT of the abdomen and pelvis with no acute changes, possible nonobstructive stricture at the terminal ileum.  1. Intractable nausea and vomiting, functional gastrointestinal syndrome.  The patient was admitted to the medical ward, she received supportive medical therapy with intravenous fluids, antiacids and as needed antiemetics. Pain control with hydromorphone and oxycodone.  Her diet was advanced with good toleration, no further nausea or  vomiting. Patient will follow-up as an outpatient within 7 to 10 days.  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  2.  Hypokalemia/dehydration.  Her renal function remained stable, potassium was corrected with potassium chloride. At her discharge sodium 136, potassium 4.5, chloride 103, bicarb 26, glucose 62, BUN 11, creatinine 0.84, magnesium 1.9.  3.  History of chronic disease, severe calorie protein malnutrition.  No clinical signs of acute Crohn's flare, continue nutritional support.  Follow-up as an outpatient.  4.  Depression/anxiety.  Patient will continue fluoxetine, gabapentin, alprazolam and clonazepam.  5.  Hypothyroidism.  Continue levothyroxine.  6.  Chronic anemia.  hemoglobin 10.2, hematocrit 33.7, MCV 88.9, but high RDW 15.7. Patient may need iron stores checked as an outpatient.    Discharge Diagnoses:  Principal Problem:   Intractable nausea and vomiting Active Problems:   Crohn disease (HCC)   Chronic abdominal pain   Anxiety   Protein-calorie malnutrition, severe   Asherman syndrome    Discharge Instructions   Allergies as of 05/06/2021      Reactions   Trazodone    Other reaction(s): Other   Bentyl [dicyclomine Hcl]    Compazine [prochlorperazine]    Reglan [metoclopramide]    Sulfa Antibiotics       Medication List    STOP taking these medications   multivitamin with minerals Tabs tablet   ondansetron 4 MG disintegrating tablet Commonly known as: Zofran ODT   saccharomyces boulardii 250 MG capsule Commonly known as: FLORASTOR     TAKE these medications   acetaminophen 650 MG CR tablet Commonly known as: TYLENOL Take 650 mg by mouth every 8 (eight) hours as needed for pain.   ALPRAZolam 1 MG tablet Commonly known as: XANAX Take 1 tablet (1 mg total) by mouth 2 (two)  times daily.   clonazePAM 0.5 MG tablet Commonly known as: KLONOPIN Take 0.5 mg by mouth daily.   FLUoxetine 20 MG capsule Commonly known as:  PROZAC Take 60 mg by mouth every morning.   gabapentin 100 MG capsule Commonly known as: NEURONTIN Take 200 mg by mouth daily.   levothyroxine 88 MCG tablet Commonly known as: SYNTHROID Take 88 mcg by mouth daily before breakfast.   Oxycodone HCl 10 MG Tabs Take 10 mg by mouth every 4 (four) hours as needed for pain.   zolpidem 10 MG tablet Commonly known as: AMBIEN Take 10 mg by mouth at bedtime.       Allergies  Allergen Reactions  . Trazodone     Other reaction(s): Other  . Bentyl [Dicyclomine Hcl]   . Compazine [Prochlorperazine]   . Reglan [Metoclopramide]   . Sulfa Antibiotics         Procedures/Studies: CT ABDOMEN PELVIS W CONTRAST  Result Date: 05/04/2021 CLINICAL DATA:  35 year old female with abdominal pain, history of Crohn's. EXAM: CT ABDOMEN AND PELVIS WITH CONTRAST TECHNIQUE: Multidetector CT imaging of the abdomen and pelvis was performed using the standard protocol following bolus administration of intravenous contrast. CONTRAST:  22mL OMNIPAQUE IOHEXOL 300 MG/ML  SOLN COMPARISON:  04/08/2021 FINDINGS: Lower chest: No acute abnormality. Hepatobiliary: No focal liver abnormality is seen. No gallstones, gallbladder wall thickening, or biliary dilatation. Pancreas: Unremarkable. No pancreatic ductal dilatation or surrounding inflammatory changes. Spleen: Normal in size without focal abnormality. Adrenals/Urinary Tract: Adrenal glands are unremarkable. Kidneys are normal, without renal calculi, focal lesion, or hydronephrosis. Bladder is unremarkable. Stomach/Bowel: Stomach is within normal limits. Possible stricturing versus peristalsis of the approximately 6 cm of the terminal ileum. No evidence of fistulization or bowel wall thickening. Appendix is not definitively visualized. Vascular/Lymphatic: No significant vascular findings are present. No enlarged abdominal or pelvic lymph nodes. Reproductive: Uterus and bilateral adnexa are unremarkable. Other: No abdominal  wall hernia or abnormality. No abdominopelvic ascites. Musculoskeletal: No acute or significant osseous findings. IMPRESSION: No CT evidence of acute changes associated with Crohn's disease or additional acute abdominopelvic abnormality. Possible nonobstructive stricturing of the terminal ileum as could be seen with chronic changes of Crohn's disease. Electronically Signed   By: Marliss Coots MD   On: 05/04/2021 11:47   CT ABDOMEN PELVIS W CONTRAST  Result Date: 04/08/2021 CLINICAL DATA:  Nausea, vomiting, abdominal pain, Crohn's disease, C diff EXAM: CT ABDOMEN AND PELVIS WITH CONTRAST TECHNIQUE: Multidetector CT imaging of the abdomen and pelvis was performed using the standard protocol following bolus administration of intravenous contrast. CONTRAST:  OMNIPAQUE IOHEXOL 300 MG/ML  SOLN COMPARISON:  None. FINDINGS: Lower chest: No acute abnormality.  Bilateral breast implants. Hepatobiliary: No solid liver abnormality is seen. No gallstones, gallbladder wall thickening, or biliary dilatation. Pancreas: Unremarkable. No pancreatic ductal dilatation or surrounding inflammatory changes. Spleen: Normal in size without significant abnormality. Adrenals/Urinary Tract: Adrenal glands are unremarkable. Kidneys are normal, without renal calculi, solid lesion, or hydronephrosis. Bladder is unremarkable. Stomach/Bowel: Stomach is within normal limits. Appendix appears normal. No evidence of bowel wall thickening, distention, or inflammatory changes. Vascular/Lymphatic: No significant vascular findings are present. No enlarged abdominal or pelvic lymph nodes. Reproductive: No mass or other significant abnormality. Ovarian cysts and follicles. Other: No abdominal wall hernia or abnormality. No abdominopelvic ascites. Musculoskeletal: No acute or significant osseous findings. IMPRESSION: 1. No acute CT findings of the abdomen or pelvis to explain nausea, vomiting, or abdominal pain. 2.  No CT findings of Crohn's  disease. 3.  No CT evidence of colitis. Electronically Signed   By: Lauralyn Primes M.D.   On: 04/08/2021 18:58   DG Abdomen Acute W/Chest  Result Date: 04/17/2021 CLINICAL DATA:  Abdominal pain EXAM: DG ABDOMEN ACUTE WITH 1 VIEW CHEST COMPARISON:  None. FINDINGS: There is no evidence of dilated bowel loops or free intraperitoneal air. No radiopaque calculi or other significant radiographic abnormality is seen. Heart size and mediastinal contours are within normal limits. Both lungs are clear. IMPRESSION: Negative abdominal radiographs.  No acute cardiopulmonary disease. Electronically Signed   By: Charlett Nose M.D.   On: 04/17/2021 21:15        Subjective: Patient is feeling better, no nausea or vomiting, no chest pain or dyspnea, she has been out of bed,   Discharge Exam: Vitals:   05/05/21 2022 05/06/21 0456  BP: 98/71 94/60  Pulse: 87 80  Resp: 16 14  Temp: (!) 97.5 F (36.4 C) 98.1 F (36.7 C)  SpO2: 100% 100%   Vitals:   05/05/21 0537 05/05/21 1350 05/05/21 2022 05/06/21 0456  BP: 126/81 111/78 98/71 94/60   Pulse: 78 65 87 80  Resp: 16 16 16 14   Temp: 97.9 F (36.6 C) (!) 97.4 F (36.3 C) (!) 97.5 F (36.4 C) 98.1 F (36.7 C)  TempSrc: Oral Oral Oral Oral  SpO2: 100% 100% 100% 100%    General: Not in pain or dyspnea,  Neurology: Awake and alert, non focal  E ENT: no pallor, no icterus, oral mucosa moist Cardiovascular: No JVD. S1-S2 present, rhythmic, no gallops, rubs, or murmurs. No lower extremity edema. Pulmonary: vesicular breath sounds bilaterally, adequate air movement, no wheezing, rhonchi or rales. Gastrointestinal. Abdomen soft and non tender Skin. No rashes Musculoskeletal: no joint deformities   The results of significant diagnostics from this hospitalization (including imaging, microbiology, ancillary and laboratory) are listed below for reference.     Microbiology: Recent Results (from the past 240 hour(s))  Resp Panel by RT-PCR (Flu A&B, Covid)  Nasopharyngeal Swab     Status: None   Collection Time: 05/04/21  3:24 PM   Specimen: Nasopharyngeal Swab; Nasopharyngeal(NP) swabs in vial transport medium  Result Value Ref Range Status   SARS Coronavirus 2 by RT PCR NEGATIVE NEGATIVE Final    Comment: (NOTE) SARS-CoV-2 target nucleic acids are NOT DETECTED.  The SARS-CoV-2 RNA is generally detectable in upper respiratory specimens during the acute phase of infection. The lowest concentration of SARS-CoV-2 viral copies this assay can detect is 138 copies/mL. A negative result does not preclude SARS-Cov-2 infection and should not be used as the sole basis for treatment or other patient management decisions. A negative result may occur with  improper specimen collection/handling, submission of specimen other than nasopharyngeal swab, presence of viral mutation(s) within the areas targeted by this assay, and inadequate number of viral copies(<138 copies/mL). A negative result must be combined with clinical observations, patient history, and epidemiological information. The expected result is Negative.  Fact Sheet for Patients:  BloggerCourse.com  Fact Sheet for Healthcare Providers:  SeriousBroker.it  This test is no t yet approved or cleared by the [place] FDA and  has been authorized for detection and/or diagnosis of SARS-CoV-2 by FDA under an Emergency Use Authorization (EUA). This EUA will remain  in effect (meaning this test can be used) for the duration of the COVID-19 declaration under Section 564(b)(1) of the Act, 21 U.S.C.section 360bbb-3(b)(1), unless the authorization is terminated  or revoked sooner.  Influenza A by PCR NEGATIVE NEGATIVE Final   Influenza B by PCR NEGATIVE NEGATIVE Final    Comment: (NOTE) The Xpert Xpress SARS-CoV-2/FLU/RSV plus assay is intended as an aid in the diagnosis of influenza from Nasopharyngeal swab specimens and should not be  used as a sole basis for treatment. Nasal washings and aspirates are unacceptable for Xpert Xpress SARS-CoV-2/FLU/RSV testing.  Fact Sheet for Patients: BloggerCourse.com  Fact Sheet for Healthcare Providers: SeriousBroker.it  This test is not yet approved or cleared by the [place] FDA and has been authorized for detection and/or diagnosis of SARS-CoV-2 by FDA under an Emergency Use Authorization (EUA). This EUA will remain in effect (meaning this test can be used) for the duration of the COVID-19 declaration under Section 564(b)(1) of the Act, 21 U.S.C. section 360bbb-3(b)(1), unless the authorization is terminated or revoked.  Performed at [facility], [address] [place], [place] [address]      Labs: BNP (last 3 results) No results for input(s): BNP in the last 8760 hours. Basic Metabolic Panel: Recent Labs  Lab 05/04/21 0736 05/04/21 0810 05/05/21 0613 05/06/21 0531  NA 143  --  138 136  K 3.2*  --  3.3* 4.5  CL 108  --  106 103  CO2 25  --  25 26  GLUCOSE 102*  --  92 62*  BUN 10  --  7 11  CREATININE 0.93  --  0.80 0.84  CALCIUM 9.3  --  8.7* 9.3  MG  --  2.0  --  1.9  PHOS  --  2.9  --   --    Liver Function Tests: Recent Labs  Lab 05/04/21 0736  AST 18  ALT 16  ALKPHOS 64  BILITOT 0.1*  PROT 8.3*  ALBUMIN 4.0   Recent Labs  Lab 05/04/21 0748  LIPASE 30   No results for input(s): AMMONIA in the last 168 hours. CBC: Recent Labs  Lab 05/04/21 0736 05/05/21 0613  WBC 6.9 7.1  NEUTROABS 3.6  --   HGB 11.7* 10.2*  HCT 36.8 33.7*  MCV 85.6 88.9  PLT 508* 398   Cardiac Enzymes: No results for input(s): CKTOTAL, CKMB, CKMBINDEX, TROPONINI in the last 168 hours. BNP: Invalid input(s): POCBNP CBG: No results for input(s): GLUCAP in the last 168 hours. D-Dimer No results for input(s): DDIMER in the last 72 hours. Hgb A1c No results for input(s): HGBA1C in the  last 72 hours. Lipid Profile No results for input(s): CHOL, HDL, LDLCALC, TRIG, CHOLHDL, LDLDIRECT in the last 72 hours. Thyroid function studies No results for input(s): TSH, T4TOTAL, T3FREE, THYROIDAB in the last 72 hours.  Invalid input(s): FREET3 Anemia work up No results for input(s): VITAMINB12, FOLATE, FERRITIN, TIBC, IRON, RETICCTPCT in the last 72 hours. Urinalysis    Component Value Date/Time   COLORURINE YELLOW 05/04/2021 1758   APPEARANCEUR CLEAR 05/04/2021 1758   LABSPEC 1.045 (H) 05/04/2021 1758   PHURINE 6.0 05/04/2021 1758   GLUCOSEU NEGATIVE 05/04/2021 1758   HGBUR NEGATIVE 05/04/2021 1758   BILIRUBINUR NEGATIVE 05/04/2021 1758   KETONESUR NEGATIVE 05/04/2021 1758   PROTEINUR NEGATIVE 05/04/2021 1758   NITRITE POSITIVE (A) 05/04/2021 1758   LEUKOCYTESUR SMALL (A) 05/04/2021 1758   Sepsis Labs Invalid input(s): PROCALCITONIN,  WBC,  LACTICIDVEN Microbiology Recent Results (from the past 240 hour(s))  Resp Panel by RT-PCR (Flu A&B, Covid) Nasopharyngeal Swab     Status: None   Collection Time: 05/04/21  3:24 PM   Specimen:  Nasopharyngeal Swab; Nasopharyngeal(NP) swabs in vial transport medium  Result Value Ref Range Status   SARS Coronavirus 2 by RT PCR NEGATIVE NEGATIVE Final    Comment: (NOTE) SARS-CoV-2 target nucleic acids are NOT DETECTED.  The SARS-CoV-2 RNA is generally detectable in upper respiratory specimens during the acute phase of infection. The lowest concentration of SARS-CoV-2 viral copies this assay can detect is 138 copies/mL. A negative result does not preclude SARS-Cov-2 infection and should not be used as the sole basis for treatment or other patient management decisions. A negative result may occur with  improper specimen collection/handling, submission of specimen other than nasopharyngeal swab, presence of viral mutation(s) within the areas targeted by this assay, and inadequate number of viral copies(<138 copies/mL). A negative  result must be combined with clinical observations, patient history, and epidemiological information. The expected result is Negative.  Fact Sheet for Patients:  BloggerCourse.comhttps://www.fda.gov/media/152166/download  Fact Sheet for Healthcare Providers:  SeriousBroker.ithttps://www.fda.gov/media/152162/download  This test is no t yet approved or cleared by the [place] FDA and  has been authorized for detection and/or diagnosis of SARS-CoV-2 by FDA under an Emergency Use Authorization (EUA). This EUA will remain  in effect (meaning this test can be used) for the duration of the COVID-19 declaration under Section 564(b)(1) of the Act, 21 U.S.C.section 360bbb-3(b)(1), unless the authorization is terminated  or revoked sooner.       Influenza A by PCR NEGATIVE NEGATIVE Final   Influenza B by PCR NEGATIVE NEGATIVE Final    Comment: (NOTE) The Xpert Xpress SARS-CoV-2/FLU/RSV plus assay is intended as an aid in the diagnosis of influenza from Nasopharyngeal swab specimens and should not be used as a sole basis for treatment. Nasal washings and aspirates are unacceptable for Xpert Xpress SARS-CoV-2/FLU/RSV testing.  Fact Sheet for Patients: BloggerCourse.comhttps://www.fda.gov/media/152166/download  Fact Sheet for Healthcare Providers: SeriousBroker.ithttps://www.fda.gov/media/152162/download  This test is not yet approved or cleared by the [place] FDA and has been authorized for detection and/or diagnosis of SARS-CoV-2 by FDA under an Emergency Use Authorization (EUA). This EUA will remain in effect (meaning this test can be used) for the duration of the COVID-19 declaration under Section 564(b)(1) of the Act, 21 U.S.C. section 360bbb-3(b)(1), unless the authorization is terminated or revoked.  Performed at [facility], [address] [place], [place] [address]      Time coordinating discharge: 45 minutes  SIGNED:   Coralie KeensMauricio Daniel Santiago Graf, MD  [facility] 05/06/2021, 9:29 AM

## 2021-05-09 ENCOUNTER — Other Ambulatory Visit: Payer: Self-pay

## 2021-05-09 ENCOUNTER — Emergency Department (HOSPITAL_COMMUNITY)
Admission: EM | Admit: 2021-05-09 | Discharge: 2021-05-09 | Disposition: A | Discharge status: Home / Self Care | Payer: 59 | Attending: Emergency Medicine | Admitting: Emergency Medicine

## 2021-05-09 ENCOUNTER — Encounter (HOSPITAL_COMMUNITY): Payer: Self-pay

## 2021-05-09 DIAGNOSIS — N12 Tubulo-interstitial nephritis, not specified as acute or chronic: Secondary | ICD-10-CM | POA: Insufficient documentation

## 2021-05-09 DIAGNOSIS — R109 Unspecified abdominal pain: Secondary | ICD-10-CM | POA: Diagnosis present

## 2021-05-09 HISTORY — DX: Autoimmune thyroiditis: E06.3

## 2021-05-09 HISTORY — DX: Crohn's disease, unspecified, without complications: K50.90

## 2021-05-09 LAB — URINALYSIS, ROUTINE W REFLEX MICROSCOPIC
Bilirubin Urine: NEGATIVE
Glucose, UA: NEGATIVE mg/dL
Ketones, ur: NEGATIVE mg/dL
Nitrite: POSITIVE — AB
Protein, ur: 30 mg/dL — AB
Specific Gravity, Urine: 1.021 (ref 1.005–1.030)
WBC, UA: >50 WBC/hpf — ABNORMAL HIGH (ref 0–5)
pH: 6 (ref 5.0–8.0)

## 2021-05-09 LAB — CBC
HCT: 40.7 % (ref 36.0–46.0)
Hemoglobin: 12.8 g/dL (ref 12.0–15.0)
MCH: 26.8 pg (ref 26.0–34.0)
MCHC: 31.4 g/dL (ref 30.0–36.0)
MCV: 85.1 fL (ref 80.0–100.0)
Platelets: 510 10*3/uL — ABNORMAL HIGH (ref 150–400)
RBC: 4.78 MIL/uL (ref 3.87–5.11)
RDW: 15.4 % (ref 11.5–15.5)
WBC: 9.8 10*3/uL (ref 4.0–10.5)
nRBC: 0 % (ref 0.0–0.2)

## 2021-05-09 LAB — COMPREHENSIVE METABOLIC PANEL
ALT: 79 U/L — ABNORMAL HIGH (ref 0–44)
AST: 74 U/L — ABNORMAL HIGH (ref 15–41)
Albumin: 4.2 g/dL (ref 3.5–5.0)
Alkaline Phosphatase: 73 U/L (ref 38–126)
Anion gap: 9 (ref 5–15)
BUN: 14 mg/dL (ref 6–20)
CO2: 24 mmol/L (ref 22–32)
Calcium: 9.3 mg/dL (ref 8.9–10.3)
Chloride: 103 mmol/L (ref 98–111)
Creatinine, Ser: 0.83 mg/dL (ref 0.44–1.00)
GFR, Estimated: >60 mL/min (ref >60)
Glucose, Bld: 97 mg/dL (ref 70–99)
Potassium: 3.5 mmol/L (ref 3.5–5.1)
Sodium: 136 mmol/L (ref 135–145)
Total Bilirubin: 0.6 mg/dL (ref 0.3–1.2)
Total Protein: 8.3 g/dL — ABNORMAL HIGH (ref 6.5–8.1)

## 2021-05-09 LAB — LIPASE, BLOOD: Lipase: 30 U/L (ref 11–51)

## 2021-05-09 LAB — I-STAT BETA HCG BLOOD, ED (MC, WL, AP ONLY): I-stat hCG, quantitative: <5 m[IU]/mL (ref <5)

## 2021-05-09 MED ORDER — CHLORDIAZEPOXIDE HCL 25 MG PO CAPS: ORAL_CAPSULE | ORAL | 0 refills | Status: DC

## 2021-05-09 MED ORDER — ONDANSETRON HCL 4 MG/2ML IJ SOLN
4.0000 mg | Freq: Once | INTRAMUSCULAR | Status: AC
  Administered 2021-05-09: 4 mg via INTRAVENOUS
  Filled 2021-05-09: qty 2

## 2021-05-09 MED ORDER — CEPHALEXIN 500 MG PO CAPS: 500.0000 mg | ORAL_CAPSULE | Freq: Four times a day (QID) | ORAL | 0 refills | Status: DC

## 2021-05-09 MED ORDER — SODIUM CHLORIDE 0.9 % IV BOLUS
1000.0000 mL | Freq: Once | INTRAVENOUS | Status: AC
  Administered 2021-05-09: 1000 mL via INTRAVENOUS

## 2021-05-09 MED ORDER — HYDROMORPHONE HCL 1 MG/ML IJ SOLN
0.5000 mg | Freq: Once | INTRAMUSCULAR | Status: AC
  Administered 2021-05-09: 0.5 mg via INTRAVENOUS
  Filled 2021-05-09: qty 1

## 2021-05-09 MED ORDER — KETOROLAC TROMETHAMINE 15 MG/ML IJ SOLN
15.0000 mg | Freq: Once | INTRAMUSCULAR | Status: AC
  Administered 2021-05-09: 15 mg via INTRAVENOUS
  Filled 2021-05-09: qty 1

## 2021-05-09 MED ORDER — SODIUM CHLORIDE 0.9 % IV SOLN
1.0000 g | Freq: Once | INTRAVENOUS | Status: AC
  Administered 2021-05-09: 1 g via INTRAVENOUS
  Filled 2021-05-09: qty 10

## 2021-05-09 MED ORDER — DIPHENHYDRAMINE HCL 50 MG/ML IJ SOLN
12.5000 mg | Freq: Once | INTRAMUSCULAR | Status: AC
  Administered 2021-05-09: 12.5 mg via INTRAVENOUS
  Filled 2021-05-09: qty 1

## 2021-05-09 MED ORDER — ACETAMINOPHEN 500 MG PO TABS
1000.0000 mg | ORAL_TABLET | Freq: Once | ORAL | Status: AC
  Administered 2021-05-09: 1000 mg via ORAL
  Filled 2021-05-09: qty 2

## 2021-05-09 NOTE — ED Notes (Signed)
Pt c/o rt flank and rt abd pain since urinary frequency nausea vomiting no diarrhea  She is on pain management.  Hx crohns disease and pylonephritis

## 2021-05-09 NOTE — ED Provider Notes (Signed)
35 yo F with a chief complaints of flank pain and dysuria.  Found to have pyelonephritis.  Plan for symptomatic therapy and reassessment.  Patient is feeling mildly better on reassessment.  She would like to try and go home.  Will prescribe her a course of antibiotics.  She also feels like she is withdrawing from Klonopin's.  She has scheduled follow-up with her pain management clinic this afternoon.  We will have her follow-up with a family doctor as well.   Melene Plan, DO 05/09/21 [phone]

## 2021-05-09 NOTE — Discharge Instructions (Addendum)
Return for worsening pain, fever, inability to eat or drink 

## 2021-05-09 NOTE — ED Triage Notes (Signed)
Pt reports that she was hospitalized for C-diff and tested negative for C-Diff since discharge. History of Crohn's. Having pain in right flank area and radiates into the right lower quadrant. Burning with urination and urinary frequency, and nausea.

## 2021-05-09 NOTE — ED Provider Notes (Signed)
[facility] Emergency Department Provider Note MRN:  [id number]  Arrival date & time: 05/09/21     Chief Complaint   Abdominal Pain   History of Present Illness   Kimberly Valencia is a 35 y.o. year-old female with a history of Crohn's disease presenting to the ED with chief complaint of abdominal pain.  Location: Right flank radiating into the right lower quadrant Duration: Several hours Onset: Gradual Timing: Constant Description: Sharp Severity: Severe Exacerbating/Alleviating Factors: None Associated Symptoms: Nausea, dysuria Pertinent Negatives: Denies fever, no headache, no chest pain, no shortness of breath.   Review of Systems  A complete 10 system review of systems was obtained and all systems are negative except as noted in the HPI and PMH.   Patient's Health History    Past Medical History:  Diagnosis Date  . Asherman syndrome   . Crohn disease (HCC)   . Hashimoto's disease     Past Surgical History:  Procedure Laterality Date  . BREAST ENHANCEMENT SURGERY    . DILATION AND CURETTAGE OF UTERUS    . TONSILLECTOMY      History reviewed. No pertinent family history.  Social History   Socioeconomic History  . Marital status: Divorced    Spouse name: Not on file  . Number of children: Not on file  . Years of education: Not on file  . Highest education level: Not on file  Occupational History  . Not on file  Tobacco Use  . Smoking status: Never Smoker  . Smokeless tobacco: Never Used  Vaping Use  . Vaping Use: Never used  Substance and Sexual Activity  . Alcohol use: Yes    Comment: Socially  . Drug use: Never  . Sexual activity: Not on file  Other Topics Concern  . Not on file  Social History Narrative  . Not on file   Social Determinants of Health   Financial Resource Strain: Not on file  Food Insecurity: Not on file  Transportation Needs: Not on file  Physical Activity: Not on file  Stress: Not on file  Social  Connections: Not on file  Intimate Partner Violence: Not on file     Physical Exam   Vitals:   05/09/21 0543  BP: (!) 139/94  Pulse: 81  Resp: 16  Temp: 98.2 F (36.8 C)  SpO2: 98%    CONSTITUTIONAL: Well-appearing, NAD NEURO:  Alert and oriented x 3, no focal deficits EYES:  eyes equal and reactive ENT/NECK:  no LAD, no JVD CARDIO: Tachycardic rate, well-perfused, normal S1 and S2 PULM:  CTAB no wheezing or rhonchi GI/GU:  normal bowel sounds, non-distended, mild suprapubic tenderness to palpation MSK/SPINE:  No gross deformities, no edema SKIN:  no rash, atraumatic PSYCH:  Appropriate speech and behavior  *Additional and/or pertinent findings included in MDM below  Diagnostic and Interventional Summary    EKG Interpretation  Date/Time:    Ventricular Rate:    PR Interval:    QRS Duration:   QT Interval:    QTC Calculation:   R Axis:     Text Interpretation:        Labs Reviewed  CBC - Abnormal; Notable for the following components:      Result Value   Platelets 510 (*)    All other components within normal limits  URINALYSIS, ROUTINE W REFLEX MICROSCOPIC - Abnormal; Notable for the following components:   APPearance CLOUDY (*)    Hgb urine dipstick SMALL (*)    Protein, ur 30 (*)  Nitrite POSITIVE (*)    Leukocytes,Ua LARGE (*)    WBC, UA >50 (*)    Bacteria, UA MANY (*)    All other components within normal limits  URINE CULTURE  LIPASE, BLOOD  COMPREHENSIVE METABOLIC PANEL  I-STAT BETA HCG BLOOD, ED ([facility], [facility], [facility] ONLY)    No orders to display    Medications  sodium chloride 0.9 % bolus 1,000 mL (has no administration in time range)  ondansetron (ZOFRAN) injection 4 mg (has no administration in time range)  ketorolac (TORADOL) 15 MG/ML injection 15 mg (has no administration in time range)  HYDROmorphone (DILAUDID) injection 0.5 mg (has no administration in time range)  cefTRIAXone (ROCEPHIN) 1 g in sodium chloride 0.9 % 100 mL IVPB (has no  administration in time range)     Procedures  /  Critical Care Procedures  ED Course and Medical Decision Making  I have reviewed the triage vital signs, the nursing notes, and pertinent available records from the EMR.  Listed above are laboratory and imaging tests that I personally ordered, reviewed, and interpreted and then considered in my medical decision making (see below for details).  Flank pain, dysuria, suspect pyelonephritis.  Less concern for kidney stone as patient has experienced both and explains that this feels more like pyelonephritis.  She also had a recent CT scan a few days ago that did not show any renal stones.  Urinalysis is grossly infected, will start ceftriaxone.  Patient is tachycardic on my evaluation, 130s, will need fluids, antibiotics, symptomatic management.  If we can get her vital signs to normalize and her symptoms well controlled, suspect she would be a candidate for discharge, signed out to oncoming provider at shift change.       Elmer Sow. Pilar Plate, MD [facility] Emergency Medicine [facility] [email or web address]  Final Clinical Impressions(s) / ED Diagnoses     ICD-10-CM   1. Pyelonephritis  N12     ED Discharge Orders    None       Discharge Instructions Discussed with and Provided to Patient:   Discharge Instructions   None       Sabas Sous, MD 05/09/21 [phone]

## 2021-05-12 LAB — URINE CULTURE: Culture: >=100000 — AB

## 2021-05-13 ENCOUNTER — Encounter: Payer: Self-pay | Admitting: Nurse Practitioner

## 2021-05-13 ENCOUNTER — Telehealth: Payer: Self-pay | Admitting: Emergency Medicine

## 2021-05-13 ENCOUNTER — Other Ambulatory Visit: Payer: Self-pay

## 2021-05-13 ENCOUNTER — Ambulatory Visit: Payer: 59 | Admitting: Nurse Practitioner

## 2021-05-13 VITALS — BP 114/75 | HR 116 | Temp 99.0°F | Ht 61.0 in | Wt 102.0 lb

## 2021-05-13 DIAGNOSIS — E039 Hypothyroidism, unspecified: Secondary | ICD-10-CM | POA: Insufficient documentation

## 2021-05-13 DIAGNOSIS — F419 Anxiety disorder, unspecified: Secondary | ICD-10-CM | POA: Diagnosis not present

## 2021-05-13 MED ORDER — FLUOXETINE HCL 20 MG PO CAPS: 60.0000 mg | ORAL_CAPSULE | Freq: Every morning | ORAL | 1 refills | Status: DC

## 2021-05-13 MED ORDER — LEVOTHYROXINE SODIUM 88 MCG PO TABS: 88.0000 ug | ORAL_TABLET | Freq: Every day | ORAL | 1 refills | Status: AC

## 2021-05-13 NOTE — Telephone Encounter (Signed)
Post ED Visit - Positive Culture Follow-up  Culture report reviewed by antimicrobial stewardship pharmacist: [facility] []  , Pharm.D. []  Enzo Bi, Pharm.D., BCPS AQ-ID []  , Pharm.D., BCPS []  Celedonio Miyamoto, Pharm.D., BCPS []  [place], Garvin Fila.D., BCPS, AAHIVP []  , Pharm.D., BCPS, AAHIVP []  Georgina Pillion, PharmD, BCPS []  , PharmD, BCPS []  [place], PharmD, BCPS []  [address], PharmD []  , PharmD, BCPS []  Estella Husk, PharmD  Pharmacy Team []  Lysle Pearl, PharmD []  , PharmD []  Soth Climes, PharmD []  , Rph []  Agapito Games) , PharmD []  Verlan Friends, PharmD []  , PharmD []  Mervyn Gay, PharmD []  , PharmD []  Vinnie Level, PharmD []  [facility], PharmD []  , PharmD []  Len Childs, PharmD   Positive urine culture Treated with cephalexin, organism sensitive to the same and no further patient follow-up is required at this time.  05/13/2021, 8:58 AM

## 2021-05-13 NOTE — Assessment & Plan Note (Signed)
No new signs and symptoms of hypothyroidism.  Refilled Synthroid follow-up in 3 months.  Education provided with printed handouts given.

## 2021-05-13 NOTE — Progress Notes (Signed)
New Patient Note  RE: Kimberly Valencia MRN: [id number] DOB: [birth year] Date of Office Visit: 05/13/2021  Chief Complaint: Establish Care, Anxiety, and Hypothyroidism  History of Present Illness:  Thyroid: Patient presents for evaluation of hypothyroidism. Current symptoms include anxiousness. Patient denies denies fatigue, weight changes, heat/cold intolerance, bowel/skin changes or CVS symptoms.    Anxiety: Patient complains of anxiety disorder.  She has the following symptoms: difficulty concentrating. Onset of symptoms was approximately a few years ago, unchanged since that time. She denies current suicidal and homicidal ideation. Family history significant for no psychiatric illness.Possible organic causes contributing are: none. Risk factors: none Previous treatment includes Xanax.  She complains of the following side effects from the treatment: none.   Assessment and Plan: Kimberly Valencia is a 35 y.o. female with: Anxiety Patient is new establishing care today.  Completed GAD-7.  Refilled fluoxetine.  Provided education to patient with printed handouts given.   Follow-up in 3 months.  Acquired hypothyroidism No new signs and symptoms of hypothyroidism.  Refilled Synthroid follow-up in 3 months.  Education provided with printed handouts given.  Return in about 3 months (around 08/13/2021).  Flowsheet Row Office Visit from 05/13/2021 in [facility]  PHQ-9 Total Score 0     GAD 7 : Generalized Anxiety Score 05/13/2021  Nervous, Anxious, on Edge 3  Control/stop worrying 2  Worry too much - different things 3  Trouble relaxing 3  Restless 1  Easily annoyed or irritable 1  Afraid - awful might happen 0  Total GAD 7 Score 13  Anxiety Difficulty Somewhat difficult    Diagnostics:   Past Medical History: Patient Active Problem List   Diagnosis Date Noted  . Acquired hypothyroidism 05/13/2021  . 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 Gastroenteritis 05/04/2021  .  Intractable vomiting   . Protein-calorie malnutrition, severe 04/18/2021  . Chronic abdominal pain   . Nausea vomiting and diarrhea   . Anxiety   . C. difficile diarrhea 04/09/2021  . Intractable nausea and vomiting 04/08/2021  . Chronic pain 04/08/2021  . Crohn disease (HCC) 04/08/2021  . C. difficile colitis 04/08/2021  . AKI (acute kidney injury) (HCC) 04/08/2021  . Hypokalemia 04/08/2021   Past Medical History:  Diagnosis Date  . Anxiety   . Asherman syndrome   . Crohn disease (HCC)   . Depression   . Hashimoto's disease    Past Surgical History: Past Surgical History:  Procedure Laterality Date  . BREAST ENHANCEMENT SURGERY    . DILATION AND CURETTAGE OF UTERUS    . TONSILLECTOMY     Medication List:  Current Outpatient Medications  Medication Sig Dispense Refill  . acetaminophen (TYLENOL) 650 MG CR tablet Take 650 mg by mouth every 8 (eight) hours as needed for pain.    Marland Kitchen ALPRAZolam (XANAX) 1 MG tablet Take 1 tablet (1 mg total) by mouth 2 (two) times daily. 10 tablet 0  . clonazePAM (KLONOPIN) 0.5 MG tablet Take 0.5 mg by mouth daily.    Marland Kitchen gabapentin (NEURONTIN) 100 MG capsule Take 200 mg by mouth daily.    Marland Kitchen oxyCODONE (OXYCONTIN) 10 mg 12 hr tablet Take 10 mg by mouth daily as needed (pain).    . Oxycodone HCl 10 MG TABS Take 10 mg by mouth every 4 (four) hours as needed for pain.    Marland Kitchen zolpidem (AMBIEN) 10 MG tablet Take 10 mg by mouth at bedtime.    Marland Kitchen FLUoxetine (PROZAC) 20 MG capsule Take 3 capsules (60 mg total) by  mouth every morning. 90 capsule 1  . levothyroxine (SYNTHROID) 88 MCG tablet Take 1 tablet (88 mcg total) by mouth daily before breakfast. 90 tablet 1   No current facility-administered medications for this visit.   Allergies: Allergies  Allergen Reactions  . Trazodone     Other reaction(s): Other  . Bentyl [Dicyclomine Hcl]   . Compazine [Prochlorperazine]   . Reglan [Metoclopramide]   . Sulfa Antibiotics   . Tramadol Rash   Social  History: Social History   Socioeconomic History  . Marital status: Divorced    Spouse name: Not on file  . Number of children: Not on file  . Years of education: Not on file  . Highest education level: Not on file  Occupational History  . Not on file  Tobacco Use  . Smoking status: Never Smoker  . Smokeless tobacco: Never Used  Vaping Use  . Vaping Use: Never used  Substance and Sexual Activity  . Alcohol use: Yes    Comment: Socially  . Drug use: Never  . Sexual activity: Yes    Birth control/protection: None  Other Topics Concern  . Not on file  Social History Narrative  . Not on file   Social Determinants of Health   Financial Resource Strain: Not on file  Food Insecurity: Not on file  Transportation Needs: Not on file  Physical Activity: Not on file  Stress: Not on file  Social Connections: Not on file       Family History: Family History  Problem Relation Age of Onset  . Hyperlipidemia Mother   . Hypertension Father          Review of Systems  Constitutional: Negative.   HENT: Negative.   Respiratory: Negative.   Cardiovascular: Negative.   Gastrointestinal: Negative.   Genitourinary: Negative.   Musculoskeletal: Negative.   Skin: Positive for rash.  Psychiatric/Behavioral: Negative for self-injury, sleep disturbance and suicidal ideas. The patient is nervous/anxious.   All other systems reviewed and are negative.  Objective: BP 114/75   Pulse (!) 116   Temp 99 F (37.2 C) (Temporal)   Ht 5\' 1"  (1.549 m)   Wt 102 lb (46.3 kg)   LMP  (LMP Unknown) Comment: ashermans disease,neg preg test  SpO2 100%   BMI 19.27 kg/m  Body mass index is 19.27 kg/m. Physical Exam Vitals and nursing note reviewed.  Constitutional:      Appearance: Normal appearance.  HENT:     Head: Normocephalic.     Nose: Nose normal.  Eyes:     Conjunctiva/sclera: Conjunctivae normal.  Cardiovascular:     Rate and Rhythm: Normal rate and regular rhythm.      Pulses: Normal pulses.     Heart sounds: Normal heart sounds.  Pulmonary:     Effort: Pulmonary effort is normal.     Breath sounds: Normal breath sounds.  Abdominal:     General: Bowel sounds are normal.  Musculoskeletal:        General: Normal range of motion.  Skin:    Findings: No rash.  Neurological:     General: No focal deficit present.     Mental Status: She is alert and oriented to person, place, and time.  Psychiatric:        Attention and Perception: Attention and perception normal.        Mood and Affect: Mood is anxious.        Speech: Speech normal.        Behavior: Behavior  normal. Behavior is cooperative.        Thought Content: Thought content does not include suicidal ideation.    The plan was reviewed with the patient/family, and all questions/concerned were addressed.  It was my pleasure to see Kimberly Valencia today and participate in her care. Please feel free to contact me with any questions or concerns.  Sincerely,  Lynnell Chad NP [facility]

## 2021-05-13 NOTE — Assessment & Plan Note (Signed)
Patient is new establishing care today.  Completed GAD-7.  Refilled fluoxetine.  Provided education to patient with printed handouts given.   Follow-up in 3 months.

## 2021-05-13 NOTE — Patient Instructions (Addendum)
[email or web address]">  Generalized Anxiety Disorder, Adult Generalized anxiety disorder (GAD) is a mental health condition. Unlike normal worries, anxiety related to GAD is not triggered by a specific event. These worries do not fade or get better with time. GAD interferes with relationships, work, and school. GAD symptoms can vary from mild to severe. People with severe GAD can have intense waves of anxiety with physical symptoms that are similar to panic attacks. What are the causes? The exact cause of GAD is not known, but the following are believed to have an impact:  Differences in natural brain chemicals.  Genes passed down from parents to children.  Differences in the way threats are perceived.  Development during childhood.  Personality. What increases the risk? The following factors may make you more likely to develop this condition:  Being female.  Having a family history of anxiety disorders.  Being very shy.  Experiencing very stressful life events, such as the death of a loved one.  Having a very stressful family environment. What are the signs or symptoms? People with GAD often worry excessively about many things in their lives, such as their health and family. Symptoms may also include:  Mental and emotional symptoms: ? Worrying excessively about natural disasters. ? Fear of being late. ? Difficulty concentrating. ? Fears that others are judging your performance.  Physical symptoms: ? Fatigue. ? Headaches, muscle tension, muscle twitches, trembling, or feeling shaky. ? Feeling like your heart is pounding or beating very fast. ? Feeling out of breath or like you cannot take a deep breath. ? Having trouble falling asleep or staying asleep, or experiencing restlessness. ? Sweating. ? Nausea, diarrhea, or irritable bowel syndrome (IBS).  Behavioral symptoms: ? Experiencing erratic moods or irritability. ? Avoidance of new situations. ? Avoidance of  people. ? Extreme difficulty making decisions. How is this diagnosed? This condition is diagnosed based on your symptoms and medical history. You will also have a physical exam. Your health care provider may perform tests to rule out other possible causes of your symptoms. To be diagnosed with GAD, a person must have anxiety that:  Is out of his or her control.  Affects several different aspects of his or her life, such as work and relationships.  Causes distress that makes him or her unable to take part in normal activities.  Includes at least three symptoms of GAD, such as restlessness, fatigue, trouble concentrating, irritability, muscle tension, or sleep problems. Before your health care provider can confirm a diagnosis of GAD, these symptoms must be present more days than they are not, and they must last for 6 months or longer. How is this treated? This condition may be treated with:  Medicine. Antidepressant medicine is usually prescribed for long-term daily control. Anti-anxiety medicines may be added in severe cases, especially when panic attacks occur.  Talk therapy (psychotherapy). Certain types of talk therapy can be helpful in treating GAD by providing support, education, and guidance. Options include: ? Cognitive behavioral therapy (CBT). People learn coping skills and self-calming techniques to ease their physical symptoms. They learn to identify unrealistic thoughts and behaviors and to replace them with more appropriate thoughts and behaviors. ? Acceptance and commitment therapy (ACT). This treatment teaches people how to be mindful as a way to cope with unwanted thoughts and feelings. ? Biofeedback. This process trains you to manage your body's response (physiological response) through breathing techniques and relaxation methods. You will work with a therapist while machines are used to monitor your physical   symptoms.  Stress management techniques. These include yoga,  meditation, and exercise. A mental health specialist can help determine which treatment is best for you. Some people see improvement with one type of therapy. However, other people require a combination of therapies.   Follow these instructions at home: Lifestyle  Maintain a consistent routine and schedule.  Anticipate stressful situations. Create a plan, and allow extra time to work with your plan.  Practice stress management or self-calming techniques that you have learned from your therapist or your health care provider. General instructions  Take over-the-counter and prescription medicines only as told by your health care provider.  Understand that you are likely to have setbacks. Accept this and be kind to yourself as you persist to take better care of yourself.  Recognize and accept your accomplishments, even if you judge them as small.  Keep all follow-up visits as told by your health care provider. This is important. Contact a health care provider if:  Your symptoms do not get better.  Your symptoms get worse.  You have signs of depression, such as: ? A persistently sad or irritable mood. ? Loss of enjoyment in activities that used to bring you joy. ? Change in weight or eating. ? Changes in sleeping habits. ? Avoiding friends or family members. ? Loss of energy for normal tasks. ? Feelings of guilt or worthlessness. Get help right away if:  You have serious thoughts about hurting yourself or others. If you ever feel like you may hurt yourself or others, or have thoughts about taking your own life, get help right away. Go to your nearest emergency department or:  Call your local emergency services (911 in the [place]  Call a suicide crisis helpline, such as the National Suicide Prevention Lifeline at 334-224-2551. This is open 24 hours a day in the [place]  Text the Crisis Text Line at 202 505 6079 (in the [place] Summary  Generalized anxiety disorder (GAD) is a mental  health condition that involves worry that is not triggered by a specific event.  People with GAD often worry excessively about many things in their lives, such as their health and family.  GAD may cause symptoms such as restlessness, trouble concentrating, sleep problems, frequent sweating, nausea, diarrhea, headaches, and trembling or muscle twitching.  A mental health specialist can help determine which treatment is best for you. Some people see improvement with one type of therapy. However, other people require a combination of therapies. This information is not intended to replace advice given to you by your health care provider. Make sure you discuss any questions you have with your health care provider. Document Revised: 10/05/2019 Document Reviewed: 10/05/2019 Elsevier Patient Education  2021 [employer]vier Inc.  Hypothyroidism  Hypothyroidism is when the thyroid gland does not make enough of certain hormones (it is underactive). The thyroid gland is a small gland located in the lower front part of the neck, just in front of the windpipe (trachea). This gland makes hormones that help control how the body uses food for energy (metabolism) as well as how the heart and brain function. These hormones also play a role in keeping your bones strong. When the thyroid is underactive, it produces too little of the hormones thyroxine (T4) and triiodothyronine (T3). What are the causes? This condition may be caused by:  Hashimoto's disease. This is a disease in which the body's disease-fighting system (immune system) attacks the thyroid gland. This is the most common cause.  Viral infections.  Pregnancy.  Certain  medicines.  Birth defects.  Past radiation treatments to the head or neck for cancer.  Past treatment with radioactive iodine.  Past exposure to radiation in the environment.  Past surgical removal of part or all of the thyroid.  Problems with a gland in the center of the brain  (pituitary gland).  Lack of enough iodine in the diet. What increases the risk? You are more likely to develop this condition if:  You are female.  You have a family history of thyroid conditions.  You use a medicine called lithium.  You take medicines that affect the immune system (immunosuppressants). What are the signs or symptoms? Symptoms of this condition include:  Feeling as though you have no energy (lethargy).  Not being able to tolerate cold.  Weight gain that is not explained by a change in diet or exercise habits.  Lack of appetite.  Dry skin.  Coarse hair.  Menstrual irregularity.  Slowing of thought processes.  Constipation.  Sadness or depression. How is this diagnosed? This condition may be diagnosed based on:  Your symptoms, your medical history, and a physical exam.  Blood tests. You may also have imaging tests, such as an ultrasound or MRI. How is this treated? This condition is treated with medicine that replaces the thyroid hormones that your body does not make. After you begin treatment, it may take several weeks for symptoms to go away. Follow these instructions at home:  Take over-the-counter and prescription medicines only as told by your health care provider.  If you start taking any new medicines, tell your health care provider.  Keep all follow-up visits as told by your health care provider. This is important. ? As your condition improves, your dosage of thyroid hormone medicine may change. ? You will need to have blood tests regularly so that your health care provider can monitor your condition. Contact a health care provider if:  Your symptoms do not get better with treatment.  You are taking thyroid hormone replacement medicine and you: ? Sweat a lot. ? Have tremors. ? Feel anxious. ? Lose weight rapidly. ? Cannot tolerate heat. ? Have emotional swings. ? Have diarrhea. ? Feel weak. Get help right away if you  have:  Chest pain.  An irregular heartbeat.  A rapid heartbeat.  Difficulty breathing. Summary  Hypothyroidism is when the thyroid gland does not make enough of certain hormones (it is underactive).  When the thyroid is underactive, it produces too little of the hormones thyroxine (T4) and triiodothyronine (T3).  The most common cause is Hashimoto's disease, a disease in which the body's disease-fighting system (immune system) attacks the thyroid gland. The condition can also be caused by viral infections, medicine, pregnancy, or past radiation treatment to the head or neck.  Symptoms may include weight gain, dry skin, constipation, feeling as though you do not have energy, and not being able to tolerate cold.  This condition is treated with medicine to replace the thyroid hormones that your body does not make. This information is not intended to replace advice given to you by your health care provider. Make sure you discuss any questions you have with your health care provider. Document Revised: 09/14/2020 Document Reviewed: 08/30/2020 Elsevier Patient Education  2021 [employer].

## 2021-05-17 ENCOUNTER — Encounter (HOSPITAL_COMMUNITY): Payer: Self-pay | Admitting: *Deleted

## 2021-05-17 ENCOUNTER — Other Ambulatory Visit: Payer: Self-pay

## 2021-05-17 ENCOUNTER — Emergency Department (HOSPITAL_COMMUNITY)
Admission: EM | Admit: 2021-05-17 | Discharge: 2021-05-17 | Disposition: A | Discharge status: Home / Self Care | Payer: 59 | Attending: Emergency Medicine | Admitting: Emergency Medicine

## 2021-05-17 DIAGNOSIS — G8929 Other chronic pain: Secondary | ICD-10-CM | POA: Diagnosis not present

## 2021-05-17 DIAGNOSIS — G894 Chronic pain syndrome: Secondary | ICD-10-CM | POA: Diagnosis not present

## 2021-05-17 DIAGNOSIS — F419 Anxiety disorder, unspecified: Secondary | ICD-10-CM

## 2021-05-17 DIAGNOSIS — N390 Urinary tract infection, site not specified: Secondary | ICD-10-CM | POA: Diagnosis not present

## 2021-05-17 DIAGNOSIS — Z87891 Personal history of nicotine dependence: Secondary | ICD-10-CM | POA: Diagnosis not present

## 2021-05-17 DIAGNOSIS — R1030 Lower abdominal pain, unspecified: Secondary | ICD-10-CM | POA: Insufficient documentation

## 2021-05-17 DIAGNOSIS — Z79899 Other long term (current) drug therapy: Secondary | ICD-10-CM | POA: Insufficient documentation

## 2021-05-17 DIAGNOSIS — K5090 Crohn's disease, unspecified, without complications: Secondary | ICD-10-CM | POA: Diagnosis present

## 2021-05-17 DIAGNOSIS — R000 Tachycardia, unspecified: Secondary | ICD-10-CM | POA: Insufficient documentation

## 2021-05-17 DIAGNOSIS — E039 Hypothyroidism, unspecified: Secondary | ICD-10-CM | POA: Diagnosis not present

## 2021-05-17 DIAGNOSIS — E43 Unspecified severe protein-calorie malnutrition: Secondary | ICD-10-CM

## 2021-05-17 HISTORY — DX: Other bacterial infections of unspecified site: A49.8

## 2021-05-17 LAB — CBC
HCT: 33.2 % — ABNORMAL LOW (ref 36.0–46.0)
Hemoglobin: 10.3 g/dL — ABNORMAL LOW (ref 12.0–15.0)
MCH: 26.5 pg (ref 26.0–34.0)
MCHC: 31 g/dL (ref 30.0–36.0)
MCV: 85.3 fL (ref 80.0–100.0)
Platelets: 422 10*3/uL — ABNORMAL HIGH (ref 150–400)
RBC: 3.89 MIL/uL (ref 3.87–5.11)
RDW: 16.2 % — ABNORMAL HIGH (ref 11.5–15.5)
WBC: 7.7 10*3/uL (ref 4.0–10.5)
nRBC: 0 % (ref 0.0–0.2)

## 2021-05-17 LAB — RAPID URINE DRUG SCREEN, HOSP PERFORMED
Amphetamines: NOT DETECTED
Barbiturates: NOT DETECTED
Benzodiazepines: POSITIVE — AB
Cocaine: NOT DETECTED
Opiates: NOT DETECTED
Tetrahydrocannabinol: NOT DETECTED

## 2021-05-17 LAB — URINALYSIS, ROUTINE W REFLEX MICROSCOPIC
Bilirubin Urine: NEGATIVE
Glucose, UA: NEGATIVE mg/dL
Hgb urine dipstick: NEGATIVE
Ketones, ur: NEGATIVE mg/dL
Nitrite: NEGATIVE
Protein, ur: 30 mg/dL — AB
Specific Gravity, Urine: 1.02 (ref 1.005–1.030)
pH: 7 (ref 5.0–8.0)

## 2021-05-17 LAB — COMPREHENSIVE METABOLIC PANEL
ALT: 20 U/L (ref 0–44)
AST: 36 U/L (ref 15–41)
Albumin: 3.5 g/dL (ref 3.5–5.0)
Alkaline Phosphatase: 68 U/L (ref 38–126)
Anion gap: 8 (ref 5–15)
BUN: 10 mg/dL (ref 6–20)
CO2: 22 mmol/L (ref 22–32)
Calcium: 9 mg/dL (ref 8.9–10.3)
Chloride: 105 mmol/L (ref 98–111)
Creatinine, Ser: 0.78 mg/dL (ref 0.44–1.00)
GFR, Estimated: >60 mL/min (ref >60)
Glucose, Bld: 89 mg/dL (ref 70–99)
Potassium: 5 mmol/L (ref 3.5–5.1)
Sodium: 135 mmol/L (ref 135–145)
Total Bilirubin: 0.8 mg/dL (ref 0.3–1.2)
Total Protein: 7.2 g/dL (ref 6.5–8.1)

## 2021-05-17 LAB — I-STAT BETA HCG BLOOD, ED (MC, WL, AP ONLY): I-stat hCG, quantitative: <5 m[IU]/mL (ref <5)

## 2021-05-17 LAB — LIPASE, BLOOD: Lipase: 22 U/L (ref 11–51)

## 2021-05-17 MED ORDER — ONDANSETRON 4 MG PO TBDP: ORAL_TABLET | ORAL | 0 refills | Status: DC

## 2021-05-17 MED ORDER — ACETAMINOPHEN 500 MG PO TABS
1000.0000 mg | ORAL_TABLET | Freq: Once | ORAL | Status: AC
  Administered 2021-05-17: 1000 mg via ORAL
  Filled 2021-05-17: qty 2

## 2021-05-17 MED ORDER — SODIUM CHLORIDE 0.9 % IV SOLN
1.0000 g | Freq: Once | INTRAVENOUS | Status: AC
  Administered 2021-05-17: 1 g via INTRAVENOUS
  Filled 2021-05-17: qty 10

## 2021-05-17 MED ORDER — DIPHENHYDRAMINE HCL 25 MG PO CAPS
50.0000 mg | ORAL_CAPSULE | Freq: Once | ORAL | Status: AC
  Administered 2021-05-17: 50 mg via ORAL
  Filled 2021-05-17: qty 2

## 2021-05-17 MED ORDER — SODIUM CHLORIDE 0.9 % IV BOLUS
1000.0000 mL | Freq: Once | INTRAVENOUS | Status: AC
  Administered 2021-05-17: 1000 mL via INTRAVENOUS

## 2021-05-17 MED ORDER — KETOROLAC TROMETHAMINE 15 MG/ML IJ SOLN
15.0000 mg | Freq: Once | INTRAMUSCULAR | Status: AC
  Administered 2021-05-17: 15 mg via INTRAVENOUS
  Filled 2021-05-17: qty 1

## 2021-05-17 MED ORDER — MORPHINE SULFATE (PF) 4 MG/ML IV SOLN
4.0000 mg | Freq: Once | INTRAVENOUS | Status: AC
Start: 2021-05-17 — End: 2021-05-17
  Administered 2021-05-17: 4 mg via INTRAVENOUS
  Filled 2021-05-17: qty 1

## 2021-05-17 NOTE — Discharge Instructions (Addendum)
Use Tylenol every 4 hours and ibuprofen every 6 hours as needed for pain.  Zofran as needed for nausea and vomiting.  Follow-up with local doctor.

## 2021-05-17 NOTE — Consult Note (Addendum)
ER Consult Note   Cindia Hustead WPY:[id number] DOB: 06-04-1986 DOA: 05/17/2021  PCP: Daryll Drown, NP Consultants:  Ilean China - GI in [place]; Pain management in [place] Patient coming from:  Home - lives alone; [place]: Father, [phone]  Chief Complaint: Abdominal pain  HPI: Kimberly Valencia is a 35 y.o. female with medical history significant of Crohn's disease; Asherman's syndrome; recurrent C diff infection s/p Dificid; and anxiety/depression presenting with refractory UTI symptoms.  She was last hospitalized from 5/7-9 with intractable n/v from Lfunctional gastrointestinal syndrome".  She returned to the ER on 5/12 with pyelonephritis and Klonopin withdrawal; she declined admission at that time.  She was treated with Keflex and culture showed sensitivity to this therapy.  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  She reports that she was diagnosed with pyleo on 5/12 and has been taking Keflex.  Still with occasional flank pain and developing RLQ pain and some LLQ pain. No vaginal d/c or bleeding.  She has Asherman's and not sure if this may be contributing.  Significant nausea, unable to take PO.  Pain meds aren't controlling her pain.  Significant tachycardia, low-grade fevers to 100.6.  Normal BMs.  She did have frequency, burning, incontinence on 5/12; no UOP until a few minutes ago - urinary symptoms have improved.    ED Course:   Has multiple medical problems.  Previously here with UTI, effectively treated.  Severe abdominal pain, B, R > L.  Claims not taking PO - normal labs, no abnormal vitals currently.  Review of Systems: As per HPI; otherwise review of systems reviewed and negative.   Ambulatory Status:  Ambulates without assistance  COVID Vaccine Status: Complete, no booster  Past Medical History:  Diagnosis Date  . Anxiety   . Asherman syndrome   . Clostridium difficile infection   . Crohn disease (HCC)   . Depression   . Hashimoto's disease     Past  Surgical History:  Procedure Laterality Date  . BREAST ENHANCEMENT SURGERY    . DILATION AND CURETTAGE OF UTERUS    . TONSILLECTOMY      Social History   Socioeconomic History  . Marital status: Divorced    Spouse name: Not on file  . Number of children: Not on file  . Years of education: Not on file  . Highest education level: Not on file  Occupational History  . Occupation: travel Engineer, civil (consulting)  Tobacco Use  . Smoking status: Former Smoker    Packs/day: 0.50    Years: 2.00    Pack years: 1.00    Quit date: 2020    Years since quitting: 2.3  . Smokeless tobacco: Never Used  Vaping Use  . Vaping Use: Never used  Substance and Sexual Activity  . Alcohol use: Yes    Comment: Socially  . Drug use: Never  . Sexual activity: Yes    Birth control/protection: None  Other Topics Concern  . Not on file  Social History Narrative  . Not on file   Social Determinants of Health   Financial Resource Strain: Not on file  Food Insecurity: Not on file  Transportation Needs: Not on file  Physical Activity: Not on file  Stress: Not on file  Social Connections: Not on file  Intimate Partner Violence: Not on file    Allergies  Allergen Reactions  . Trazodone     Other reaction(s): Other  . Bentyl [Dicyclomine Hcl]   . Compazine [Prochlorperazine]   . Reglan [Metoclopramide]   .  Sulfa Antibiotics   . Tramadol Rash    Family History  Problem Relation Age of Onset  . Hyperlipidemia Mother   . Hypertension Father     Prior to Admission medications   Medication Sig Start Date End Date Taking? Authorizing Provider  acetaminophen (TYLENOL) 650 MG CR tablet Take 650 mg by mouth every 8 (eight) hours as needed for pain.    [provider]  ALPRAZolam Prudy Feeler) 1 MG tablet Take 1 tablet (1 mg total) by mouth 2 (two) times daily. 05/06/21   Arrien, York Ram, MD  clonazePAM (KLONOPIN) 0.5 MG tablet Take 0.5 mg by mouth daily.    [provider]  FLUoxetine (PROZAC)  20 MG capsule Take 3 capsules (60 mg total) by mouth every morning. 05/13/21   Daryll Drown, NP  gabapentin (NEURONTIN) 100 MG capsule Take 200 mg by mouth daily. 03/15/21   [provider]  levothyroxine (SYNTHROID) 88 MCG tablet Take 1 tablet (88 mcg total) by mouth daily before breakfast. 05/13/21   Daryll Drown, NP  oxyCODONE (OXYCONTIN) 10 mg 12 hr tablet Take 10 mg by mouth daily as needed (pain).    [provider]  Oxycodone HCl 10 MG TABS Take 10 mg by mouth every 4 (four) hours as needed for pain. 04/01/21   [provider]  zolpidem (AMBIEN) 10 MG tablet Take 10 mg by mouth at bedtime. 03/20/21   [provider]    Physical Exam: Vitals:   05/17/21 1309 05/17/21 1330 05/17/21 1400 05/17/21 1408  BP:  117/81 115/79   Pulse:  89 91 91  Resp:  (!) 23 (!) 24 16  Temp: 98.3 F (36.8 C)     TempSrc: Oral     SpO2:  100% 100% 100%  Weight:      Height:         . General:  Appears calm and comfortable and is in NAD; very thin . Eyes:  PERRL, EOMI, normal lids, iris . ENT:  grossly normal hearing, lips & tongue, mmm; appropriate dentition . Neck:  no LAD, masses or thyromegaly . Cardiovascular:  RR with mild tachycardia, no m/r/g. No LE edema.  Marland Kitchen Respiratory:   CTA bilaterally with no wheezes/rales/rhonchi.  Normal respiratory effort. . Abdomen:  soft, diffusely TTP, ND . Skin:  no rash or induration seen on limited exam . Musculoskeletal:  grossly normal tone BUE/BLE, good ROM, no bony abnormality . Psychiatric:  grossly normal mood and affect, speech fluent and appropriate, AOx3 . Neurologic:  CN 2-12 grossly intact, moves all extremities in coordinated fashion    Radiological Exams on Admission: Independently reviewed - see discussion in A/P where applicable  No results found.  EKG: not done   Labs on Admission: I have personally reviewed the available labs and imaging studies at the time of the admission.  Pertinent labs:    Normal CMP WBC 7.7 Hgb 10.3 Platelets 422 HCG negative   Assessment/Plan Principal Problem:   Chronic pain Active Problems:   Crohn disease (HCC)   Anxiety   Protein-calorie malnutrition, severe   Acquired hypothyroidism   Acute lower UTI    Chronic abdominal pain with recent UTI -Patient with multiple ER visits and prior admission for abdominal pain -Recently diagnosed with UTI; culture shows sensitivity to chosen abx, patient's urinary symptoms improving - encouraged to complete course but does not appear to need further therapy, repeat culture pending -She has report of Crohn's and Asherman's and takes significant doses of  controlled meds - see below -She has mild tachycardia with normal vitals and labs today -No indication for admission at this time -GC/Chl pending  Chronic pain -I have reviewed this patient in the  Controlled Substances Reporting System.  She is receiving medications from one provider and appears to be taking them as prescribed. -She is at exceedingly high risk of opioid misuse, diversion, or overdose. 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  Rethinking her current career choice is encouraged. -Additionally, with multiple ER visits there is concern for drug-seeking behavior and the patient will need to be closely monitored given her very high risks. -I would not suggest increasing her chronic Oxycontin and Oxy IR and would encourage consideration of weaning these medications given her overall frailty and high risk. -Interestingly, her UDS today is negative for opiates - although the patient reports taking them.  Depression/anxiety -Continue Prozac -As above, she is at very high risk from controlled medications.   -Would consider changing Ambien to trazodone.    -She reports that she has weaned off Klonopin and is only on Xanax at this time - but Xanax is a much higher risk for misuse than long-acting Klonopin.  History of chronic disease, severe calorie protein malnutrition -No clinical signs of acute Crohn's flare -Needs nutritional support -Denies disordered eating  Hypothyroidism -Recent normal TSH -Continue Synthroid at current dose for now    Body mass index is 19.27 kg/m.   Thank you for this interesting consult.  Given her overall hemodynamic stability and unremarkable work-up, she appears to be stable for discharge to home at this time.  I would strongly recommend vigilance regarding her controlled substances given her very high-risks associated with use.    Jonah Blue MD [facility]   How to contact the [facility] Attending or Consulting provider 7A - 7P or covering provider during after hours 7P -7A, for this patient?  1. Check the care team in [facility] and look for a) attending/consulting [facility] provider listed and b) the [facility] team listed 2. Log into [email or web address] and use [place]'s universal password to access. If you do not have the password, please contact the hospital operator. 3. Locate the [facility] provider you are looking for under [facility] and page to a number that you can be directly reached. 4. If you still have difficulty reaching the provider, please page the [facility] (Director on Call) for the Hospitalists listed on amion for assistance.   05/17/2021, 2:19 PM

## 2021-05-17 NOTE — ED Provider Notes (Signed)
[facility] EMERGENCY DEPARTMENT Provider Note   CSN: [id number] Arrival date & time: 05/17/21  0786     History Chief Complaint  Patient presents with  . Urinary Tract Infection    Kimberly Valencia is a 35 y.o. female.  Patient with Asherman syndrome, Crohn's disease not currently on medications, thyroid disease, depression, C. difficile, chronic pain presents with worsening abdominal pain lower, flank pain with recent urine infection diagnosis.  Patient has 2 days left of oral antibiotic.  Last time they recommend admission patient want to try outpatient.  Patient is a travel Engineer, civil (consulting).  Symptoms worsening.  Patient's had nausea decreased appetite no active vomiting.        Past Medical History:  Diagnosis Date  . Anxiety   . Asherman syndrome   . Clostridium difficile infection   . Crohn disease (HCC)   . Depression   . Hashimoto's disease     Patient Active Problem List   Diagnosis Date Noted  . Acquired hypothyroidism 05/13/2021  . 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 Gastroenteritis 05/04/2021  . Intractable vomiting   . Protein-calorie malnutrition, severe 04/18/2021  . Chronic abdominal pain   . Nausea vomiting and diarrhea   . Anxiety   . C. difficile diarrhea 04/09/2021  . Intractable nausea and vomiting 04/08/2021  . Chronic pain 04/08/2021  . Crohn disease (HCC) 04/08/2021  . C. difficile colitis 04/08/2021  . AKI (acute kidney injury) (HCC) 04/08/2021  . Hypokalemia 04/08/2021    Past Surgical History:  Procedure Laterality Date  . BREAST ENHANCEMENT SURGERY    . DILATION AND CURETTAGE OF UTERUS    . TONSILLECTOMY       OB History   No obstetric history on file.     Family History  Problem Relation Age of Onset  . Hyperlipidemia Mother   . Hypertension Father     Social History   Tobacco Use  . Smoking status: Former Smoker    Packs/day: 0.50    Years: 2.00    Pack years: 1.00    Quit date: 2020    Years since quitting:  2.3  . Smokeless tobacco: Never Used  Vaping Use  . Vaping Use: Never used  Substance Use Topics  . Alcohol use: Yes    Comment: Socially  . Drug use: Never    Home Medications Prior to Admission medications   Medication Sig Start Date End Date Taking? Authorizing Provider  ondansetron (ZOFRAN ODT) 4 MG disintegrating tablet 4mg  ODT q4 hours prn nausea/vomit 05/17/21  Yes 05/19/21, MD  acetaminophen (TYLENOL) 650 MG CR tablet Take 650 mg by mouth every 8 (eight) hours as needed for pain.    [provider]  ALPRAZolam Blane Ohara) 1 MG tablet Take 1 tablet (1 mg total) by mouth 2 (two) times daily. 05/06/21   Arrien, 07/06/21, MD  clonazePAM (KLONOPIN) 0.5 MG tablet Take 0.5 mg by mouth daily.    [provider]  FLUoxetine (PROZAC) 20 MG capsule Take 3 capsules (60 mg total) by mouth every morning. 05/13/21   05/15/21, NP  gabapentin (NEURONTIN) 100 MG capsule Take 200 mg by mouth daily. 03/15/21   [provider]  levothyroxine (SYNTHROID) 88 MCG tablet Take 1 tablet (88 mcg total) by mouth daily before breakfast. 05/13/21   05/15/21, NP  oxyCODONE (OXYCONTIN) 10 mg 12 hr tablet Take 10 mg by mouth daily as needed (pain).    [provider]  Oxycodone HCl 10 MG  TABS Take 10 mg by mouth every 4 (four) hours as needed for pain. 04/01/21   [provider]  zolpidem (AMBIEN) 10 MG tablet Take 10 mg by mouth at bedtime. 03/20/21   [provider]    Allergies    Trazodone, Bentyl [dicyclomine hcl], Compazine [prochlorperazine], Reglan [metoclopramide], Sulfa antibiotics, and Tramadol  Review of Systems   Review of Systems  Constitutional: Positive for fatigue. Negative for chills and fever.  HENT: Negative for congestion.   Eyes: Negative for visual disturbance.  Respiratory: Negative for shortness of breath.   Cardiovascular: Negative for chest pain.  Gastrointestinal: Positive for abdominal pain and nausea.  Negative for vomiting.  Genitourinary: Positive for flank pain. Negative for dysuria, vaginal bleeding and vaginal discharge.  Musculoskeletal: Negative for back pain, neck pain and neck stiffness.  Skin: Negative for rash.  Neurological: Negative for light-headedness and headaches.    Physical Exam Updated Vital Signs BP 117/81   Pulse 89   Temp 98.3 F (36.8 C) (Oral)   Resp (!) 23   Ht 5\' 1"  (1.549 m)   Wt 46.3 kg   LMP 05/17/2021 Comment: ashermans disease,neg preg test  SpO2 100%   BMI 19.27 kg/m   Physical Exam Vitals and nursing note reviewed.  Constitutional:      Appearance: She is well-developed.  HENT:     Head: Normocephalic and atraumatic.     Mouth/Throat:     Mouth: Mucous membranes are dry.  Eyes:     General:        Right eye: No discharge.        Left eye: No discharge.     Conjunctiva/sclera: Conjunctivae normal.  Neck:     Trachea: No tracheal deviation.  Cardiovascular:     Rate and Rhythm: Tachycardia present.  Pulmonary:     Effort: Pulmonary effort is normal.  Abdominal:     General: There is no distension.     Palpations: Abdomen is soft.     Tenderness: There is no abdominal tenderness. There is no guarding.  Musculoskeletal:        General: No swelling.     Cervical back: Normal range of motion.  Skin:    General: Skin is warm.     Findings: No rash.  Neurological:     General: No focal deficit present.     Mental Status: She is alert and oriented to person, place, and time.  Psychiatric:     Comments: Uncomfortable due to pain.     ED Results / Procedures / Treatments   Labs (all labs ordered are listed, but only abnormal results are displayed) Labs Reviewed  CBC - Abnormal; Notable for the following components:      Result Value   Hemoglobin 10.3 (*)    HCT 33.2 (*)    RDW 16.2 (*)    Platelets 422 (*)    All other components within normal limits  URINALYSIS, ROUTINE W REFLEX MICROSCOPIC - Abnormal; Notable for the  following components:   APPearance CLOUDY (*)    Protein, ur 30 (*)    Leukocytes,Ua SMALL (*)    Bacteria, UA MANY (*)    All other components within normal limits  URINE CULTURE  LIPASE, BLOOD  COMPREHENSIVE METABOLIC PANEL  RAPID URINE DRUG SCREEN, HOSP PERFORMED  I-STAT BETA HCG BLOOD, ED ([facility], [facility], [facility] ONLY)  GC/CHLAMYDIA PROBE AMP ([place]) NOT AT [facility]    EKG None  Radiology No results found.  Procedures Ultrasound  ED Peripheral IV (Provider)  Date/Time: 05/17/2021 11:06 AM Performed by: Blane Ohara, MD Authorized by: Blane Ohara, MD   Procedure details:    Indications: hydration and multiple failed IV attempts     Location:  Right AC   Angiocath:  20 G   Bedside Ultrasound Guided: Yes     Images: archived     Patient tolerated procedure without complications: Yes     Dressing applied: Yes       Medications Ordered in ED Medications  sodium chloride 0.9 % bolus 1,000 mL (0 mLs Intravenous Stopped 05/17/21 1312)  cefTRIAXone (ROCEPHIN) 1 g in sodium chloride 0.9 % 100 mL IVPB (0 g Intravenous Stopped 05/17/21 1216)  ketorolac (TORADOL) 15 MG/ML injection 15 mg (15 mg Intravenous Given 05/17/21 1136)  diphenhydrAMINE (BENADRYL) capsule 50 mg (50 mg Oral Given 05/17/21 1145)  morphine 4 MG/ML injection 4 mg (4 mg Intravenous Given 05/17/21 1313)  acetaminophen (TYLENOL) tablet 1,000 mg (1,000 mg Oral Given 05/17/21 1312)    ED Course  I have reviewed the triage vital signs and the nursing notes.  Pertinent labs & imaging results that were available during my care of the patient were reviewed by me and considered in my medical decision making (see chart for details).    MDM Rules/Calculators/A&P                          Patient with Asherman syndrome, Crohn's presents with worsening abdominal pain and recent urine infection.  Concern for persistent UTI versus [place] versus acute on chronic abdominal pain versus Crohn's related less likely with no significant  diarrhea or vomiting.  Patient clinically dehydrated with dry mucous membranes, tachycardic.  Pain meds and IV fluids given.  Repeat pain meds ordered.  Paged unassigned for admission as patient's pain persisting.  Blood work reassuring overall with mild anemia 10 hemoglobin, normal white blood cell count, normal electrolytes. Urinalysis pending.  Urinalysis reviewed showing many bacteria, small leukocytes.  Patient was given Rocephin.  With persistent pain, recurrent visits consulted hospitalist discussed admission.   Hospitalist evaluated, patient is being on multiple controlled medications including narcotics and benzodiazepines.  Concern for addiction and concern especially with traveling around for her job and working in the [employer].  Patient stable for outpatient follow-up.   Final Clinical Impression(s) / ED Diagnoses Final diagnoses:  Abdominal pain, lower  Other chronic pain    Rx / DC Orders ED Discharge Orders         Ordered    ondansetron (ZOFRAN ODT) 4 MG disintegrating tablet        05/17/21 1401           Blane Ohara, MD 05/17/21 1402

## 2021-05-17 NOTE — ED Triage Notes (Signed)
Pt states she has 2 days left of antibiotic for UTI and there is no improvement in her symptoms.  States they wanted to admit her on her last visit, but she elected to go home.

## 2021-05-17 NOTE — ED Notes (Signed)
Patient verbalizes understanding of discharge instructions. Opportunity for questioning and answers were provided. Pt discharged from ED. 

## 2021-05-18 LAB — URINE CULTURE: Culture: NO GROWTH

## 2021-05-20 LAB — GC/CHLAMYDIA PROBE AMP (~~LOC~~) NOT AT ARMC
Chlamydia: NEGATIVE
Comment: NEGATIVE
Comment: NORMAL
Neisseria Gonorrhea: NEGATIVE

## 2021-05-28 ENCOUNTER — Encounter (HOSPITAL_COMMUNITY): Payer: Self-pay | Admitting: *Deleted

## 2021-05-28 ENCOUNTER — Emergency Department (HOSPITAL_COMMUNITY)
Admission: EM | Admit: 2021-05-28 | Discharge: 2021-05-28 | Disposition: A | Discharge status: Home / Self Care | Payer: 59 | Attending: Emergency Medicine | Admitting: Emergency Medicine

## 2021-05-28 ENCOUNTER — Emergency Department (HOSPITAL_COMMUNITY): Payer: 59

## 2021-05-28 ENCOUNTER — Other Ambulatory Visit: Payer: Self-pay

## 2021-05-28 DIAGNOSIS — E039 Hypothyroidism, unspecified: Secondary | ICD-10-CM | POA: Diagnosis not present

## 2021-05-28 DIAGNOSIS — R1030 Lower abdominal pain, unspecified: Secondary | ICD-10-CM | POA: Insufficient documentation

## 2021-05-28 DIAGNOSIS — R110 Nausea: Secondary | ICD-10-CM | POA: Insufficient documentation

## 2021-05-28 DIAGNOSIS — R000 Tachycardia, unspecified: Secondary | ICD-10-CM | POA: Insufficient documentation

## 2021-05-28 DIAGNOSIS — G8929 Other chronic pain: Secondary | ICD-10-CM

## 2021-05-28 DIAGNOSIS — Z87891 Personal history of nicotine dependence: Secondary | ICD-10-CM | POA: Diagnosis not present

## 2021-05-28 DIAGNOSIS — E876 Hypokalemia: Secondary | ICD-10-CM

## 2021-05-28 DIAGNOSIS — R109 Unspecified abdominal pain: Secondary | ICD-10-CM

## 2021-05-28 LAB — I-STAT BETA HCG BLOOD, ED (MC, WL, AP ONLY): I-stat hCG, quantitative: <5 m[IU]/mL (ref <5)

## 2021-05-28 LAB — COMPREHENSIVE METABOLIC PANEL
ALT: 20 U/L (ref 0–44)
AST: 25 U/L (ref 15–41)
Albumin: 4.7 g/dL (ref 3.5–5.0)
Alkaline Phosphatase: 74 U/L (ref 38–126)
Anion gap: 11 (ref 5–15)
BUN: 15 mg/dL (ref 6–20)
CO2: 22 mmol/L (ref 22–32)
Calcium: 9.8 mg/dL (ref 8.9–10.3)
Chloride: 105 mmol/L (ref 98–111)
Creatinine, Ser: 0.73 mg/dL (ref 0.44–1.00)
GFR, Estimated: >60 mL/min (ref >60)
Glucose, Bld: 108 mg/dL — ABNORMAL HIGH (ref 70–99)
Potassium: 3.2 mmol/L — ABNORMAL LOW (ref 3.5–5.1)
Sodium: 138 mmol/L (ref 135–145)
Total Bilirubin: 0.5 mg/dL (ref 0.3–1.2)
Total Protein: 8.8 g/dL — ABNORMAL HIGH (ref 6.5–8.1)

## 2021-05-28 LAB — CBC WITH DIFFERENTIAL/PLATELET
Abs Immature Granulocytes: 0.02 10*3/uL (ref 0.00–0.07)
Basophils Absolute: 0 10*3/uL (ref 0.0–0.1)
Basophils Relative: 1 %
Eosinophils Absolute: 0 10*3/uL (ref 0.0–0.5)
Eosinophils Relative: 0 %
HCT: 36.7 % (ref 36.0–46.0)
Hemoglobin: 11.3 g/dL — ABNORMAL LOW (ref 12.0–15.0)
Immature Granulocytes: 0 %
Lymphocytes Relative: 20 %
Lymphs Abs: 1.3 10*3/uL (ref 0.7–4.0)
MCH: 26.5 pg (ref 26.0–34.0)
MCHC: 30.8 g/dL (ref 30.0–36.0)
MCV: 85.9 fL (ref 80.0–100.0)
Monocytes Absolute: 0.5 10*3/uL (ref 0.1–1.0)
Monocytes Relative: 8 %
Neutro Abs: 4.7 10*3/uL (ref 1.7–7.7)
Neutrophils Relative %: 71 %
Platelets: 562 10*3/uL — ABNORMAL HIGH (ref 150–400)
RBC: 4.27 MIL/uL (ref 3.87–5.11)
RDW: 16.2 % — ABNORMAL HIGH (ref 11.5–15.5)
WBC: 6.6 10*3/uL (ref 4.0–10.5)
nRBC: 0 % (ref 0.0–0.2)

## 2021-05-28 LAB — LIPASE, BLOOD: Lipase: 22 U/L (ref 11–51)

## 2021-05-28 MED ORDER — POTASSIUM CHLORIDE ER 20 MEQ PO TBCR: 20.0000 meq | EXTENDED_RELEASE_TABLET | Freq: Every day | ORAL | 0 refills | Status: DC

## 2021-05-28 MED ORDER — SODIUM CHLORIDE 0.9 % IV BOLUS
1000.0000 mL | Freq: Once | INTRAVENOUS | Status: AC
  Administered 2021-05-28: 1000 mL via INTRAVENOUS

## 2021-05-28 MED ORDER — HYDROMORPHONE HCL 1 MG/ML IJ SOLN
1.0000 mg | Freq: Once | INTRAMUSCULAR | Status: AC
  Administered 2021-05-28: 1 mg via INTRAVENOUS
  Filled 2021-05-28: qty 1

## 2021-05-28 MED ORDER — FAMOTIDINE IN NACL 20-0.9 MG/50ML-% IV SOLN
20.0000 mg | Freq: Once | INTRAVENOUS | Status: AC
  Administered 2021-05-28: 20 mg via INTRAVENOUS
  Filled 2021-05-28: qty 50

## 2021-05-28 MED ORDER — SODIUM CHLORIDE 0.9 % IV SOLN
12.5000 mg | Freq: Four times a day (QID) | INTRAVENOUS | Status: DC | PRN
  Administered 2021-05-28: 12.5 mg via INTRAVENOUS
  Filled 2021-05-28: qty 12.5

## 2021-05-28 MED ORDER — ONDANSETRON HCL 4 MG/2ML IJ SOLN
4.0000 mg | Freq: Once | INTRAMUSCULAR | Status: AC
Start: 2021-05-28 — End: 2021-05-28
  Administered 2021-05-28: 4 mg via INTRAVENOUS
  Filled 2021-05-28: qty 2

## 2021-05-28 MED ORDER — KETOROLAC TROMETHAMINE 30 MG/ML IJ SOLN
30.0000 mg | Freq: Once | INTRAMUSCULAR | Status: AC
  Administered 2021-05-28: 30 mg via INTRAVENOUS
  Filled 2021-05-28: qty 1

## 2021-05-28 MED ORDER — LORAZEPAM 2 MG/ML IJ SOLN
1.0000 mg | Freq: Once | INTRAMUSCULAR | Status: AC
  Administered 2021-05-28: 1 mg via INTRAVENOUS
  Filled 2021-05-28: qty 1

## 2021-05-28 NOTE — ED Provider Notes (Signed)
[facility]-EMERGENCY DEPT Provider Note   CSN: [id number] Arrival date & time: 05/28/21  1420     History Chief Complaint  Patient presents with  . Abdominal Pain    Kimberly Valencia is a 35 y.o. female.  HPI   35 year old female with past medical history of Asherman syndrome, Crohn's disease, chronic abdominal pain, anxiety, frequent UTI presents to the emergency department with ongoing lower abdominal pain associated with nausea.  Patient states a week ago she was diagnosed with colitis at [facility].  She was given symptomatic treatment.  She states for the past week the Zofran has been giving her minimal relief, she has not been able to tolerate p.o. including her anxiety or oral pain medicine (oxycodone).  She denies any fever, states that the stool has become formed, denies any genitourinary symptoms.  Past Medical History:  Diagnosis Date  . Anxiety   . Asherman syndrome   . Clostridium difficile infection   . Crohn disease (HCC)   . Depression   . Hashimoto's disease     Patient Active Problem List   Diagnosis Date Noted  . Acute lower UTI 05/17/2021  . Acquired hypothyroidism 05/13/2021  . 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 Gastroenteritis 05/04/2021  . Intractable vomiting   . Protein-calorie malnutrition, severe 04/18/2021  . Chronic abdominal pain   . Nausea vomiting and diarrhea   . Anxiety   . C. difficile diarrhea 04/09/2021  . Intractable nausea and vomiting 04/08/2021  . Chronic pain 04/08/2021  . Crohn disease (HCC) 04/08/2021  . C. difficile colitis 04/08/2021  . AKI (acute kidney injury) (HCC) 04/08/2021  . Hypokalemia 04/08/2021    Past Surgical History:  Procedure Laterality Date  . BREAST ENHANCEMENT SURGERY    . DILATION AND CURETTAGE OF UTERUS    . TONSILLECTOMY       OB History   No obstetric history on file.     Family History  Problem Relation Age of Onset  . Hyperlipidemia Mother   . Hypertension Father      Social History   Tobacco Use  . Smoking status: Former Smoker    Packs/day: 0.50    Years: 2.00    Pack years: 1.00    Quit date: 2020    Years since quitting: 2.4  . Smokeless tobacco: Never Used  Vaping Use  . Vaping Use: Never used  Substance Use Topics  . Alcohol use: Yes    Comment: Socially  . Drug use: Never    Home Medications Prior to Admission medications   Medication Sig Start Date End Date Taking? Authorizing Provider  acetaminophen (TYLENOL) 650 MG CR tablet Take 650 mg by mouth every 8 (eight) hours as needed for pain.    [provider]  ALPRAZolam Prudy Feeler) 1 MG tablet Take 1 tablet (1 mg total) by mouth 2 (two) times daily. 05/06/21   Arrien, York Ram, MD  clonazePAM (KLONOPIN) 0.5 MG tablet Take 0.5 mg by mouth daily.    [provider]  FLUoxetine (PROZAC) 20 MG capsule Take 3 capsules (60 mg total) by mouth every morning. 05/13/21   Daryll Drown, NP  gabapentin (NEURONTIN) 100 MG capsule Take 200 mg by mouth daily. 03/15/21   [provider]  levothyroxine (SYNTHROID) 88 MCG tablet Take 1 tablet (88 mcg total) by mouth daily before breakfast. 05/13/21   Daryll Drown, NP  ondansetron (ZOFRAN ODT) 4 MG disintegrating tablet 4mg  ODT q4 hours prn nausea/vomit 05/17/21   05/19/21,  MD  oxyCODONE (OXYCONTIN) 10 mg 12 hr tablet Take 10 mg by mouth daily as needed (pain).    [provider]  Oxycodone HCl 10 MG TABS Take 10 mg by mouth every 4 (four) hours as needed for pain. 04/01/21   [provider]  zolpidem (AMBIEN) 10 MG tablet Take 10 mg by mouth at bedtime. 03/20/21   [provider]    Allergies    Trazodone, Bentyl [dicyclomine hcl], Compazine [prochlorperazine], Reglan [metoclopramide], Sulfa antibiotics, and Tramadol  Review of Systems   Review of Systems  Constitutional: Positive for appetite change and fatigue. Negative for chills and fever.  HENT: Negative for congestion.   Eyes:  Negative for visual disturbance.  Respiratory: Negative for shortness of breath.   Cardiovascular: Negative for chest pain.  Gastrointestinal: Positive for abdominal pain and nausea. Negative for diarrhea and vomiting.  Genitourinary: Negative for difficulty urinating, dysuria, hematuria and pelvic pain.  Skin: Negative for rash.  Neurological: Negative for headaches.    Physical Exam Updated Vital Signs BP 127/78   Pulse (!) 104   Temp 98.4 F (36.9 C) (Oral)   Resp 15   LMP 05/17/2021 Comment: ashermans disease,neg preg test  SpO2 100%   Physical Exam Vitals and nursing note reviewed.  Constitutional:      General: She is not in acute distress.    Appearance: Normal appearance.  HENT:     Head: Normocephalic.     Mouth/Throat:     Mouth: Mucous membranes are moist.  Cardiovascular:     Rate and Rhythm: Tachycardia present.  Pulmonary:     Effort: Pulmonary effort is normal. No respiratory distress.  Abdominal:     Palpations: Abdomen is soft.     Tenderness: There is generalized abdominal tenderness. There is no guarding or rebound.  Skin:    General: Skin is warm.  Neurological:     Mental Status: She is alert and oriented to person, place, and time. Mental status is at baseline.  Psychiatric:        Mood and Affect: Mood normal.     ED Results / Procedures / Treatments   Labs (all labs ordered are listed, but only abnormal results are displayed) Labs Reviewed  COMPREHENSIVE METABOLIC PANEL - Abnormal; Notable for the following components:      Result Value   Potassium 3.2 (*)    Glucose, Bld 108 (*)    Total Protein 8.8 (*)    All other components within normal limits  CBC WITH DIFFERENTIAL/PLATELET - Abnormal; Notable for the following components:   Hemoglobin 11.3 (*)    RDW 16.2 (*)    Platelets 562 (*)    All other components within normal limits  LIPASE, BLOOD  I-STAT BETA HCG BLOOD, ED ([facility], [facility], [facility] ONLY)    EKG None  Radiology No results  found.  Procedures Procedures   Medications Ordered in ED Medications  sodium chloride 0.9 % bolus 1,000 mL (has no administration in time range)  ketorolac (TORADOL) 30 MG/ML injection 30 mg (has no administration in time range)  LORazepam (ATIVAN) injection 1 mg (has no administration in time range)  ondansetron (ZOFRAN) injection 4 mg (has no administration in time range)  famotidine (PEPCID) IVPB 20 mg premix (has no administration in time range)    ED Course  I have reviewed the triage vital signs and the nursing notes.  Pertinent labs & imaging results that were available during my care of the patient were reviewed by  me and considered in my medical decision making (see chart for details).    MDM Rules/Calculators/A&P                          35 year old female presents emergency department with ongoing abdominal pain and nausea.  She is tachycardic, abdomen is diffusely tender.  She endorses significant decreased p.o. intake including her pain and anxiety medication.  This could be contributing to her tachycardia.  Blood work is reassuring but does show a hypokalemia.  We will plan for CT of the abdomen pelvis just for general rule out.  CT of the abdomen pelvis shows no acute changes.  Blood work is baseline with a mild hypokalemia, patient has been prescribed replacement.  After medication patient feels improved.  I believe that this is chronic ongoing abdominal pain, no signs of acute process or surgical process.  Patient multiple times asked for IV pain medicine along with IV Benadryl, concern for some seeking aspects.  However at this time she appears comfortable.  She has nausea and pain medicine at home.  Patient will be discharged and treated as an outpatient.  Discharge plan and strict return to ED precautions discussed, patient verbalizes understanding and agreement.  This patient was evaluated during a time of global shortage of iodinated contrast media. Based on guidance  from the [employer] of Radiology, best practices, and local institutional approaches an alternative path for evaluating and managing the patient may have been employed in order to provide optimal care during this shortage.  Final Clinical Impression(s) / ED Diagnoses Final diagnoses:  None    Rx / DC Orders ED Discharge Orders    None       Rozelle Logan, DO 05/28/21 2304

## 2021-05-28 NOTE — ED Triage Notes (Signed)
Pt complains of abdominal pain and vomiting, no diarrhea. She was diagnosed with viral colitis last week but symptoms have not improved.

## 2021-05-28 NOTE — ED Provider Notes (Signed)
Emergency Medicine Provider Triage Evaluation Note  Kimberly Valencia , a 35 y.o. female  was evaluated in triage.  Pt complains of lower abdominal pain for the past few days.  Was diagnosed with colitis 1 week ago but feels that her symptoms have not improved.  Reports continued decreased appetite, nausea.  She has been taking Zofran but continues to be symptomatic.  No changes to bowel movements or pelvic complaints.  Review of Systems  Positive: Abdominal pain, nausea Negative: Changes to bowel movements or pelvic complaints  Physical Exam  LMP 05/17/2021 Comment: ashermans disease,neg preg test Gen:   Awake, no distress   Resp:  Normal effort  MSK:   Moves extremities without difficulty  Other:  Tenderness in the left lower  Medical Decision Making  Medically screening exam initiated at 2:40 PM.  Appropriate orders placed.  Dejah Droessler was informed that the remainder of the evaluation will be completed by another provider, this initial triage assessment does not replace that evaluation, and the importance of remaining in the ED until their evaluation is complete.  Labs ordered   Dietrich Pates, Cordelia Poche 05/28/21 1441    Gerhard Munch, MD 05/28/21 [phone]

## 2021-05-28 NOTE — Discharge Instructions (Signed)
You have been seen and discharged from the emergency department.  Follow-up with your primary provider for reevaluation and further care. Take home medications as prescribed. If you have any worsening symptoms or further concerns for your health please return to an emergency department for further evaluation. 

## 2021-05-28 NOTE — ED Notes (Signed)
Pain has decreased from 8/10 to a 2/10, however patient still feels nauseous.

## 2021-06-09 ENCOUNTER — Other Ambulatory Visit: Payer: Self-pay

## 2021-06-09 ENCOUNTER — Inpatient Hospital Stay (HOSPITAL_COMMUNITY)
Admission: EM | Admit: 2021-06-09 | Discharge: 2021-06-12 | DRG: 392 | Disposition: A | Discharge status: Home / Self Care | Payer: 59 | Attending: Internal Medicine | Admitting: Internal Medicine

## 2021-06-09 ENCOUNTER — Encounter (HOSPITAL_COMMUNITY): Payer: Self-pay

## 2021-06-09 DIAGNOSIS — K5090 Crohn's disease, unspecified, without complications: Secondary | ICD-10-CM | POA: Diagnosis present

## 2021-06-09 DIAGNOSIS — R197 Diarrhea, unspecified: Principal | ICD-10-CM | POA: Diagnosis present

## 2021-06-09 DIAGNOSIS — Z87891 Personal history of nicotine dependence: Secondary | ICD-10-CM

## 2021-06-09 DIAGNOSIS — Z79890 Hormone replacement therapy (postmenopausal): Secondary | ICD-10-CM

## 2021-06-09 DIAGNOSIS — R109 Unspecified abdominal pain: Secondary | ICD-10-CM

## 2021-06-09 DIAGNOSIS — Z888 Allergy status to other drugs, medicaments and biological substances status: Secondary | ICD-10-CM

## 2021-06-09 DIAGNOSIS — G894 Chronic pain syndrome: Secondary | ICD-10-CM | POA: Diagnosis present

## 2021-06-09 DIAGNOSIS — B9689 Other specified bacterial agents as the cause of diseases classified elsewhere: Secondary | ICD-10-CM | POA: Diagnosis present

## 2021-06-09 DIAGNOSIS — K529 Noninfective gastroenteritis and colitis, unspecified: Secondary | ICD-10-CM

## 2021-06-09 DIAGNOSIS — Z20822 Contact with and (suspected) exposure to covid-19: Secondary | ICD-10-CM | POA: Diagnosis present

## 2021-06-09 DIAGNOSIS — R112 Nausea with vomiting, unspecified: Secondary | ICD-10-CM

## 2021-06-09 DIAGNOSIS — E063 Autoimmune thyroiditis: Secondary | ICD-10-CM | POA: Diagnosis present

## 2021-06-09 DIAGNOSIS — N856 Intrauterine synechiae: Secondary | ICD-10-CM | POA: Diagnosis present

## 2021-06-09 DIAGNOSIS — D649 Anemia, unspecified: Secondary | ICD-10-CM | POA: Diagnosis present

## 2021-06-09 DIAGNOSIS — F32A Depression, unspecified: Secondary | ICD-10-CM | POA: Diagnosis present

## 2021-06-09 DIAGNOSIS — Z882 Allergy status to sulfonamides status: Secondary | ICD-10-CM

## 2021-06-09 DIAGNOSIS — F419 Anxiety disorder, unspecified: Secondary | ICD-10-CM | POA: Diagnosis present

## 2021-06-09 DIAGNOSIS — Z79899 Other long term (current) drug therapy: Secondary | ICD-10-CM

## 2021-06-09 LAB — CBC
HCT: 42 % (ref 36.0–46.0)
Hemoglobin: 12.8 g/dL (ref 12.0–15.0)
MCH: 25.6 pg — ABNORMAL LOW (ref 26.0–34.0)
MCHC: 30.5 g/dL (ref 30.0–36.0)
MCV: 84 fL (ref 80.0–100.0)
Platelets: 532 10*3/uL — ABNORMAL HIGH (ref 150–400)
RBC: 5 MIL/uL (ref 3.87–5.11)
RDW: 16 % — ABNORMAL HIGH (ref 11.5–15.5)
WBC: 7.3 10*3/uL (ref 4.0–10.5)
nRBC: 0 % (ref 0.0–0.2)

## 2021-06-09 LAB — I-STAT BETA HCG BLOOD, ED (MC, WL, AP ONLY): I-stat hCG, quantitative: <5 m[IU]/mL (ref <5)

## 2021-06-09 LAB — COMPREHENSIVE METABOLIC PANEL
ALT: 13 U/L (ref 0–44)
AST: 23 U/L (ref 15–41)
Albumin: 4.6 g/dL (ref 3.5–5.0)
Alkaline Phosphatase: 72 U/L (ref 38–126)
Anion gap: 9 (ref 5–15)
BUN: 9 mg/dL (ref 6–20)
CO2: 24 mmol/L (ref 22–32)
Calcium: 10.2 mg/dL (ref 8.9–10.3)
Chloride: 101 mmol/L (ref 98–111)
Creatinine, Ser: 0.81 mg/dL (ref 0.44–1.00)
GFR, Estimated: >60 mL/min (ref >60)
Glucose, Bld: 138 mg/dL — ABNORMAL HIGH (ref 70–99)
Potassium: 3.8 mmol/L (ref 3.5–5.1)
Sodium: 134 mmol/L — ABNORMAL LOW (ref 135–145)
Total Bilirubin: 0.5 mg/dL (ref 0.3–1.2)
Total Protein: 9.2 g/dL — ABNORMAL HIGH (ref 6.5–8.1)

## 2021-06-09 LAB — LIPASE, BLOOD: Lipase: 21 U/L (ref 11–51)

## 2021-06-09 NOTE — ED Triage Notes (Signed)
Pt reports abdominal pain and N/V/D x2 weeks. Pt was diagnosed with colitis but symptoms have not improved. Pt has hx of crohns.

## 2021-06-10 DIAGNOSIS — N856 Intrauterine synechiae: Secondary | ICD-10-CM | POA: Diagnosis present

## 2021-06-10 DIAGNOSIS — B9689 Other specified bacterial agents as the cause of diseases classified elsewhere: Secondary | ICD-10-CM | POA: Diagnosis present

## 2021-06-10 DIAGNOSIS — D649 Anemia, unspecified: Secondary | ICD-10-CM | POA: Diagnosis present

## 2021-06-10 DIAGNOSIS — E063 Autoimmune thyroiditis: Secondary | ICD-10-CM | POA: Diagnosis present

## 2021-06-10 DIAGNOSIS — F32A Depression, unspecified: Secondary | ICD-10-CM | POA: Diagnosis present

## 2021-06-10 DIAGNOSIS — K5090 Crohn's disease, unspecified, without complications: Secondary | ICD-10-CM | POA: Diagnosis present

## 2021-06-10 DIAGNOSIS — Z87891 Personal history of nicotine dependence: Secondary | ICD-10-CM | POA: Diagnosis not present

## 2021-06-10 DIAGNOSIS — Z888 Allergy status to other drugs, medicaments and biological substances status: Secondary | ICD-10-CM | POA: Diagnosis not present

## 2021-06-10 DIAGNOSIS — R197 Diarrhea, unspecified: Principal | ICD-10-CM

## 2021-06-10 DIAGNOSIS — Z882 Allergy status to sulfonamides status: Secondary | ICD-10-CM | POA: Diagnosis not present

## 2021-06-10 DIAGNOSIS — Z79899 Other long term (current) drug therapy: Secondary | ICD-10-CM | POA: Diagnosis not present

## 2021-06-10 DIAGNOSIS — Z20822 Contact with and (suspected) exposure to covid-19: Secondary | ICD-10-CM | POA: Diagnosis present

## 2021-06-10 DIAGNOSIS — F419 Anxiety disorder, unspecified: Secondary | ICD-10-CM | POA: Diagnosis present

## 2021-06-10 DIAGNOSIS — Z79890 Hormone replacement therapy (postmenopausal): Secondary | ICD-10-CM | POA: Diagnosis not present

## 2021-06-10 DIAGNOSIS — G894 Chronic pain syndrome: Secondary | ICD-10-CM | POA: Diagnosis present

## 2021-06-10 LAB — C DIFFICILE QUICK SCREEN W PCR REFLEX
C Diff antigen: POSITIVE — AB
C Diff toxin: NEGATIVE

## 2021-06-10 LAB — URINALYSIS, ROUTINE W REFLEX MICROSCOPIC
Bilirubin Urine: NEGATIVE
Glucose, UA: NEGATIVE mg/dL
Hgb urine dipstick: NEGATIVE
Ketones, ur: NEGATIVE mg/dL
Leukocytes,Ua: NEGATIVE
Nitrite: NEGATIVE
Protein, ur: NEGATIVE mg/dL
Specific Gravity, Urine: 1.016 (ref 1.005–1.030)
pH: 6 (ref 5.0–8.0)

## 2021-06-10 LAB — RESP PANEL BY RT-PCR (FLU A&B, COVID) ARPGX2
Influenza A by PCR: NEGATIVE
Influenza B by PCR: NEGATIVE
SARS Coronavirus 2 by RT PCR: NEGATIVE

## 2021-06-10 MED ORDER — SODIUM CHLORIDE 0.9 % IV BOLUS (SEPSIS)
2000.0000 mL | Freq: Once | INTRAVENOUS | Status: AC
  Administered 2021-06-10: 2000 mL via INTRAVENOUS

## 2021-06-10 MED ORDER — OXYCODONE HCL 10 MG PO TABS: 10.0000 mg | ORAL_TABLET | ORAL | Status: DC | PRN

## 2021-06-10 MED ORDER — DIPHENHYDRAMINE HCL 50 MG/ML IJ SOLN
25.0000 mg | Freq: Once | INTRAMUSCULAR | Status: AC
  Administered 2021-06-10: 25 mg via INTRAVENOUS
  Filled 2021-06-10: qty 1

## 2021-06-10 MED ORDER — ONDANSETRON HCL 4 MG/2ML IJ SOLN
4.0000 mg | Freq: Four times a day (QID) | INTRAMUSCULAR | Status: DC | PRN
  Administered 2021-06-10: 4 mg via INTRAVENOUS
  Filled 2021-06-10: qty 2

## 2021-06-10 MED ORDER — CIPROFLOXACIN IN D5W 400 MG/200ML IV SOLN
400.0000 mg | Freq: Once | INTRAVENOUS | Status: DC
  Filled 2021-06-10: qty 200

## 2021-06-10 MED ORDER — LACTATED RINGERS IV SOLN: INTRAVENOUS | Status: DC

## 2021-06-10 MED ORDER — ONDANSETRON HCL 4 MG/2ML IJ SOLN: 4.0000 mg | Freq: Four times a day (QID) | INTRAMUSCULAR | Status: DC | PRN

## 2021-06-10 MED ORDER — ZOLPIDEM TARTRATE 10 MG PO TABS: 10.0000 mg | ORAL_TABLET | Freq: Every day | ORAL | Status: DC

## 2021-06-10 MED ORDER — OXYCODONE HCL 5 MG PO TABS: 10.0000 mg | ORAL_TABLET | ORAL | Status: DC | PRN

## 2021-06-10 MED ORDER — GABAPENTIN 100 MG PO CAPS
200.0000 mg | ORAL_CAPSULE | Freq: Every day | ORAL | Status: DC
  Administered 2021-06-11 – 2021-06-12 (×2): 200 mg via ORAL
  Filled 2021-06-10 (×2): qty 2

## 2021-06-10 MED ORDER — SODIUM CHLORIDE 0.9 % IV SOLN
12.5000 mg | Freq: Four times a day (QID) | INTRAVENOUS | Status: DC | PRN
  Administered 2021-06-10 – 2021-06-11 (×3): 12.5 mg via INTRAVENOUS
  Filled 2021-06-10 (×3): qty 12.5

## 2021-06-10 MED ORDER — DIPHENHYDRAMINE HCL 50 MG/ML IJ SOLN
12.5000 mg | Freq: Once | INTRAMUSCULAR | Status: AC
  Administered 2021-06-10: 12.5 mg via INTRAVENOUS
  Filled 2021-06-10: qty 1

## 2021-06-10 MED ORDER — ZOLPIDEM TARTRATE 5 MG PO TABS
5.0000 mg | ORAL_TABLET | Freq: Every day | ORAL | Status: DC
  Administered 2021-06-10 – 2021-06-11 (×2): 5 mg via ORAL
  Filled 2021-06-10 (×2): qty 1

## 2021-06-10 MED ORDER — LEVOTHYROXINE SODIUM 88 MCG PO TABS
88.0000 ug | ORAL_TABLET | Freq: Every day | ORAL | Status: DC
  Administered 2021-06-11 – 2021-06-12 (×2): 88 ug via ORAL
  Filled 2021-06-10 (×2): qty 1

## 2021-06-10 MED ORDER — FLUOXETINE HCL 20 MG PO CAPS
60.0000 mg | ORAL_CAPSULE | Freq: Every morning | ORAL | Status: DC
  Administered 2021-06-11 – 2021-06-12 (×2): 60 mg via ORAL
  Filled 2021-06-10 (×2): qty 3

## 2021-06-10 MED ORDER — LACTATED RINGERS IV BOLUS
1000.0000 mL | Freq: Once | INTRAVENOUS | Status: AC
  Administered 2021-06-10: 1000 mL via INTRAVENOUS

## 2021-06-10 MED ORDER — HYDROMORPHONE HCL 1 MG/ML IJ SOLN
1.0000 mg | Freq: Once | INTRAMUSCULAR | Status: AC
  Administered 2021-06-10: 1 mg via INTRAVENOUS
  Filled 2021-06-10: qty 1

## 2021-06-10 MED ORDER — ALPRAZOLAM 1 MG PO TABS
1.0000 mg | ORAL_TABLET | Freq: Two times a day (BID) | ORAL | Status: DC
  Administered 2021-06-10 – 2021-06-12 (×4): 1 mg via ORAL
  Filled 2021-06-10: qty 2
  Filled 2021-06-10 (×3): qty 1

## 2021-06-10 MED ORDER — HYDROMORPHONE HCL 1 MG/ML IJ SOLN
1.0000 mg | INTRAMUSCULAR | Status: DC | PRN
  Administered 2021-06-10 – 2021-06-12 (×13): 1 mg via INTRAVENOUS
  Filled 2021-06-10 (×14): qty 1

## 2021-06-10 NOTE — ED Provider Notes (Signed)
[facility]-EMERGENCY DEPT Provider Note   CSN: [id number] Arrival date & time: 06/09/21  2137     History Chief Complaint  Patient presents with   Abdominal Pain    Kimberly Valencia is a 35 y.o. female.  The history is provided by the patient.  Abdominal Pain Pain location:  Generalized Pain quality: cramping   Pain radiates to:  Does not radiate Pain severity:  Moderate Onset quality:  Gradual Timing:  Intermittent Progression:  Worsening Chronicity:  Recurrent Relieved by:  Nothing Worsened by:  Nothing Associated symptoms: diarrhea, nausea and vomiting   Associated symptoms: no chest pain, no dysuria, no fever, no hematemesis, no hematochezia, no melena and no vaginal bleeding   Patient with history of Crohn's disease, chronic abdominal pain presents with abdominal cramping, vomiting diarrhea.  She reports she was recently admitted, was discharged and the pain is continued.  She reports she typically will go on Cipro and Flagyl for symptoms but was not given this last time. Reports up to 10 bowel movements per day that is nonbloody.  She also ports associated vomiting that is nonbloody. 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 difficile diarrhea 04/09/2021   Intractable nausea and vomiting 04/08/2021   Chronic pain 04/08/2021   Crohn disease (HCC) 04/08/2021   C. difficile colitis  04/08/2021   AKI (acute kidney injury) (HCC) 04/08/2021   Hypokalemia 04/08/2021    Past Surgical History:  Procedure Laterality Date   BREAST ENHANCEMENT SURGERY     DILATION AND CURETTAGE OF UTERUS     TONSILLECTOMY       OB History   No obstetric history on file.     Family History  Problem Relation Age of Onset   Hyperlipidemia Mother    Hypertension Father     Social History   Tobacco Use   Smoking status: Former    Packs/day: 0.50    Years: 2.00    Pack years: 1.00    Types: Cigarettes    Quit date: 2020    Years since quitting: 2.4   Smokeless tobacco: Never  Vaping Use   Vaping Use: Never used  Substance Use Topics   Alcohol use: Yes    Comment: Socially   Drug use: Never    Home Medications Prior to Admission medications   Medication Sig Start Date End Date Taking? Authorizing Provider  acetaminophen (TYLENOL) 650 MG CR tablet Take 650 mg by mouth every 8 (eight) hours as needed for pain.    [provider]  ALPRAZolam Prudy Feeler) 1 MG tablet Take 1 tablet (1 mg total) by mouth 2 (two) times daily. 05/06/21   Arrien, York Ram, MD  clonazePAM (KLONOPIN) 0.5 MG tablet Take 0.5 mg by mouth daily.    [provider]  FLUoxetine (PROZAC) 20 MG capsule Take 3 capsules (60  mg total) by mouth every morning. 05/13/21   Daryll Drown, NP  gabapentin (NEURONTIN) 100 MG capsule Take 200 mg by mouth daily. 03/15/21   [provider]  levothyroxine (SYNTHROID) 88 MCG tablet Take 1 tablet (88 mcg total) by mouth daily before breakfast. 05/13/21   Daryll Drown, NP  ondansetron (ZOFRAN ODT) 4 MG disintegrating tablet 4mg  ODT q4 hours prn nausea/vomit 05/17/21   05/19/21, MD  oxyCODONE (OXYCONTIN) 10 mg 12 hr tablet Take 10 mg by mouth daily as needed (pain).    [provider]  Oxycodone HCl 10 MG TABS Take 10 mg by mouth every 4 (four) hours as needed for pain. 04/01/21   [provider]  potassium chloride 20 MEQ  TBCR Take 20 mEq by mouth daily for 5 days. 05/28/21 06/02/21  Horton, 08/02/21, DO  zolpidem (AMBIEN) 10 MG tablet Take 10 mg by mouth at bedtime. 03/20/21   [provider]    Allergies    Trazodone, Bentyl [dicyclomine hcl], Compazine [prochlorperazine], Reglan [metoclopramide], Sulfa antibiotics, and Tramadol  Review of Systems   Review of Systems  Constitutional:  Negative for fever.  Cardiovascular:  Negative for chest pain.  Gastrointestinal:  Positive for abdominal pain, diarrhea, nausea and vomiting. Negative for blood in stool, hematemesis, hematochezia and melena.  Genitourinary:  Negative for dysuria and vaginal bleeding.  All other systems reviewed and are negative.  Physical Exam Updated Vital Signs BP (!) 107/59   Pulse 80   Temp 98.4 F (36.9 C) (Oral)   Resp 18   Ht 1.549 m (5\' 1" )   Wt 45.4 kg   LMP 05/17/2021 Comment: ashermans disease,neg preg test  SpO2 100%   BMI 18.89 kg/m   Physical Exam CONSTITUTIONAL: Thin appearing, anxious HEAD: Normocephalic/atraumatic EYES: EOMI/PERRL, no icterus ENMT: Mucous membranes dry NECK: supple no meningeal signs SPINE/BACK:entire spine nontender CV: S1/S2 noted, no murmurs/rubs/gallops noted, tachycardic LUNGS: Lungs are clear to auscultation bilaterally, no apparent distress ABDOMEN: soft, diffuse mild tenderness, no rebound or guarding, bowel sounds noted throughout abdomen GU:no cva tenderness NEURO: Pt is awake/alert/appropriate, moves all extremitiesx4.  No facial droop.   EXTREMITIES: pulses normal/equal, full ROM SKIN: warm, color normal PSYCH: Anxious ED Results / Procedures / Treatments   Labs (all labs ordered are listed, but only abnormal results are displayed) Labs Reviewed  COMPREHENSIVE METABOLIC PANEL - Abnormal; Notable for the following components:      Result Value   Sodium 134 (*)    Glucose, Bld 138 (*)    Total Protein 9.2 (*)    All other components within normal limits  CBC -  Abnormal; Notable for the following components:   MCH 25.6 (*)    RDW 16.0 (*)    Platelets 532 (*)    All other components within normal limits  RESP PANEL BY RT-PCR (FLU A&B, COVID) ARPGX2  LIPASE, BLOOD  URINALYSIS, ROUTINE W REFLEX MICROSCOPIC  I-STAT BETA HCG BLOOD, ED ([facility], [facility], [facility] ONLY)    EKG None  Radiology No results found.  Procedures Procedures   Medications Ordered in ED Medications  ondansetron (ZOFRAN) injection 4 mg (4 mg Intravenous Given 06/10/21 0312)  ciprofloxacin (CIPRO) IVPB 400 mg (has no administration in time range)  sodium chloride 0.9 % bolus 2,000 mL (2,000 mLs Intravenous New Bag/Given 06/10/21 0227)  diphenhydrAMINE (BENADRYL) injection 25 mg (25 mg Intravenous Given 06/10/21 0222)  HYDROmorphone (DILAUDID) injection 1 mg (1 mg Intravenous Given 06/10/21 0222)  HYDROmorphone (DILAUDID) injection  1 mg (1 mg Intravenous Given 06/10/21 0353)  lactated ringers bolus 1,000 mL (1,000 mLs Intravenous Bolus from Bag 06/10/21 0517)  diphenhydrAMINE (BENADRYL) injection 25 mg (25 mg Intravenous Given 06/10/21 0515)  HYDROmorphone (DILAUDID) injection 1 mg (1 mg Intravenous Given 06/10/21 1610)    ED Course  I have reviewed the triage vital signs and the nursing notes.  Pertinent labs  results that were available during my care of the patient were reviewed by me and considered in my medical decision making (see chart for details).    MDM Rules/Calculators/A&P                          6:44 AM Patient with reported history of Crohn's disease presents with recurrent abdominal pain and vomiting/diarrhea.  She thinks it is because she has not been on Cipro and Flagyl recently.  Patient has multiple drug allergies including Haldol.  She reports the only medications that work for her symptoms are Dilaudid and Benadryl Patient's had 3 CT abdomen pelvis scans since April.  Most recent imaging was negative Plan to treat pain, rehydrate and reassess. 6:44 AM Patient was  monitored for several hours and given multiple rounds of medications.  She is still having significant nausea although tachycardia is improved.  She does not feel that she can be discharged, but she does feel she needs antibiotics for possible colitis. Will defer any CT imaging since she has had multiple imaging modalities recently.  This patient reports she is unable to take p.o., she will be admitted for IV hydration, IV pain medications as well as antibiotics for presumed colitis.  Discussed with Dr. Dorthula Perfect for admission  Final Clinical Impression(s) / ED Diagnoses Final diagnoses:  Nausea vomiting and diarrhea  Intractable abdominal pain  Colitis    Rx / DC Orders ED Discharge Orders     None        Zadie Rhine, MD 06/10/21 [phone]

## 2021-06-10 NOTE — H&P (Addendum)
History and Physical    Kimberly Valencia KKX:[id number] DOB: 29-Nov-1986 DOA: 06/09/2021  PCP: Daryll Drown, NP  Patient coming from: Home  Chief Complaint: abdominal pain and diarrhea  HPI: Kimberly Valencia is a 35 y.o. female with medical history significant of Crohns's, Asherman syndrome, chronic pain, anxiety, hypothyroidism. Presenting with abdominal pain and diarrhea. Multiple admissions to various hospitals and ER visits for the same in the past couple of months. Most recently at [facility] in [place], [place] w/ release around June 03, 2001. She reports that since that discharge, she has had 10 BM/day. She has not been able to tolerate PO intake. She has not had any fevers. She reports LLQ/RLQ crampy pain that has not responded to her regular oxycodone. Her symptoms worsened last night to include palpitations and weakness. She decided to come to the ED for help.   ED Course: Lab work was normal. She was tachy to 130's w/o fever. BP was good. No imaging obtained d/t recent imaging in the past month. She was empirically ordered cipro d/t her report that her diarrhea only responds to cipro. She was given fluids, anti-emetics, and pain control. [facility] was called for admission.   Review of Systems:  Denies CP, dyspnea, fevers, sick contacts. Review of systems is otherwise negative for all not mentioned in HPI.   PMHx Past Medical History:  Diagnosis Date   Anxiety    Asherman syndrome    Clostridium difficile infection    Crohn disease (HCC)    Depression    Hashimoto's disease     PSHx Past Surgical History:  Procedure Laterality Date   BREAST ENHANCEMENT SURGERY     DILATION AND CURETTAGE OF UTERUS     TONSILLECTOMY      SocHx  reports that she quit smoking about 2 years ago. She has a 1.00 pack-year smoking history. She has never used smokeless tobacco. She reports current alcohol use. She reports that she does not use drugs.  Allergies  Allergen Reactions    Trazodone     Other reaction(s): Other   Bentyl [Dicyclomine Hcl]    Compazine [Prochlorperazine]    Reglan [Metoclopramide]    Sulfa Antibiotics    Tramadol Rash    FamHx Family History  Problem Relation Age of Onset   Hyperlipidemia Mother    Hypertension Father     Prior to Admission medications   Medication Sig Start Date End Date Taking? Authorizing Provider  acetaminophen (TYLENOL) 650 MG CR tablet Take 650 mg by mouth every 8 (eight) hours as needed for pain.    [provider]  ALPRAZolam Prudy Feeler) 1 MG tablet Take 1 tablet (1 mg total) by mouth 2 (two) times daily. 05/06/21   Arrien, York Ram, MD  clonazePAM (KLONOPIN) 0.5 MG tablet Take 0.5 mg by mouth daily.    [provider]  FLUoxetine (PROZAC) 20 MG capsule Take 3 capsules (60 mg total) by mouth every morning. 05/13/21   Daryll Drown, NP  gabapentin (NEURONTIN) 100 MG capsule Take 200 mg by mouth daily. 03/15/21   [provider]  levothyroxine (SYNTHROID) 88 MCG tablet Take 1 tablet (88 mcg total) by mouth daily before breakfast. 05/13/21   Daryll Drown, NP  ondansetron (ZOFRAN ODT) 4 MG disintegrating tablet 4mg  ODT q4 hours prn nausea/vomit 05/17/21   05/19/21, MD  oxyCODONE (OXYCONTIN) 10 mg 12 hr tablet Take 10 mg by mouth daily as needed (pain).    [provider]  Oxycodone  HCl 10 MG TABS Take 10 mg by mouth every 4 (four) hours as needed for pain. 04/01/21   [provider]  potassium chloride 20 MEQ TBCR Take 20 mEq by mouth daily for 5 days. 05/28/21 06/02/21  Horton, Clabe Seal, DO  zolpidem (AMBIEN) 10 MG tablet Take 10 mg by mouth at bedtime. 03/20/21   [provider]    Physical Exam: Vitals:   06/10/21 0014 06/10/21 0215 06/10/21 0400 06/10/21 0503  BP: 121/69 108/81 101/63 (!) 107/59  Pulse: (!) 118 (!) 117 80 80  Resp: 20 18 18 18   Temp:      TempSrc:      SpO2: 100% 100% 100% 100%  Weight:      Height:        General: 35 y.o.  female resting in bed in NAD Eyes: PERRL, normal sclera ENMT: Nares patent w/o discharge, orophaynx clear, dentition normal, ears w/o discharge/lesions/ulcers Neck: Supple, trachea midline Cardiovascular: RRR, +S1, S2, no m/g/r, equal pulses throughout Respiratory: CTABL, no w/r/r, normal WOB GI: BS+, ND, mild TTP RLQ, no masses noted, no organomegaly noted MSK: No e/c/c Skin: No rashes, bruises, ulcerations noted Neuro: A&O x 3, no focal deficits Psyc: Appropriate interaction and affect, calm/cooperative  Labs on Admission: I have personally reviewed following labs and imaging studies  CBC: Recent Labs  Lab 06/09/21 2234  WBC 7.3  HGB 12.8  HCT 42.0  MCV 84.0  PLT 532*   Basic Metabolic Panel: Recent Labs  Lab 06/09/21 2234  NA 134*  K 3.8  CL 101  CO2 24  GLUCOSE 138*  BUN 9  CREATININE 0.81  CALCIUM 10.2   GFR: Estimated Creatinine Clearance: 70.8 mL/min (by C-G formula based on SCr of 0.81 mg/dL). Liver Function Tests: Recent Labs  Lab 06/09/21 2234  AST 23  ALT 13  ALKPHOS 72  BILITOT 0.5  PROT 9.2*  ALBUMIN 4.6   Recent Labs  Lab 06/09/21 2234  LIPASE 21   No results for input(s): AMMONIA in the last 168 hours. Coagulation Profile: No results for input(s): INR, PROTIME in the last 168 hours. Cardiac Enzymes: No results for input(s): CKTOTAL, CKMB, CKMBINDEX, TROPONINI in the last 168 hours. BNP (last 3 results) No results for input(s): PROBNP in the last 8760 hours. HbA1C: No results for input(s): HGBA1C in the last 72 hours. CBG: No results for input(s): GLUCAP in the last 168 hours. Lipid Profile: No results for input(s): CHOL, HDL, LDLCALC, TRIG, CHOLHDL, LDLDIRECT in the last 72 hours. Thyroid Function Tests: No results for input(s): TSH, T4TOTAL, FREET4, T3FREE, THYROIDAB in the last 72 hours. Anemia Panel: No results for input(s): VITAMINB12, FOLATE, FERRITIN, TIBC, IRON, RETICCTPCT in the last 72 hours. Urine analysis:     Component Value Date/Time   COLORURINE YELLOW 05/17/2021 1311   APPEARANCEUR CLOUDY (A) 05/17/2021 1311   LABSPEC 1.020 05/17/2021 1311   PHURINE 7.0 05/17/2021 1311   GLUCOSEU NEGATIVE 05/17/2021 1311   HGBUR NEGATIVE 05/17/2021 1311   BILIRUBINUR NEGATIVE 05/17/2021 1311   KETONESUR NEGATIVE 05/17/2021 1311   PROTEINUR 30 (A) 05/17/2021 1311   NITRITE NEGATIVE 05/17/2021 1311   LEUKOCYTESUR SMALL (A) 05/17/2021 1311    Radiological Exams on Admission: No results found.  EKG: None obtained in ED  Assessment/Plan Abdominal pain Diarrhea Hx of Crohns Hx of Ashermans     - place in obs, med-surg     - fluids, anti-emetics, pain control     - no imaging obtained, no stool sample  obtained yet; d/c abx     - check GI PCR, C diff screen     - she is not on suppressive therapy; she says that she developed abs to remicaide years ago and humira was also ineffective d/t abs     - reports having an endoscopy w/ biopsies at [facility] in [place], [place] recently; will acquire those records     - multiple CTs since April w/o evidence of Crohns; will not add steroids at this time     - for now, will treat this more as intractable N/V and diarrhea of unknown cause     - of note, recent admission to [facility] w/ similar presentation. Treated with IVF, pain control. GI panel and c diff were ordered but not collected prior to her election for discharge home on her home regimen  Intractable N/V     - fluids, anti-emetics, CLD  Anxiety     - continue home regimen as tolerated  Hypothyroidism     - continue home regimen as tolerated  Chronic pain Opioid dependence     - she reports that she follows with a pain specialist in [place] area     - she will be on a modified regimen while inpt; any refills needed will need to be acquired through her pain mgmt specialist      DVT prophylaxis: SCDs  Code Status: FULL  Family Communication: None at bedside  Consults called: None   Status is:  Observation  The patient remains OBS appropriate and will d/c before 2 midnights.  Dispo: The patient is from: Home              Anticipated d/c is to: Home              Patient currently is not medically stable to d/c.   Difficult to place patient No  Time spent coordinating admission: 45 minutes  Donnice Nielsen A Tuwana Kapaun DO [facility]  If 7PM-7AM, please contact night-coverage [email or web address]  06/10/2021, 7:10 AM

## 2021-06-10 NOTE — ED Notes (Addendum)
unsuccessfull PIV attempt

## 2021-06-11 LAB — CBC
HCT: 33.9 % — ABNORMAL LOW (ref 36.0–46.0)
Hemoglobin: 9.9 g/dL — ABNORMAL LOW (ref 12.0–15.0)
MCH: 25.9 pg — ABNORMAL LOW (ref 26.0–34.0)
MCHC: 29.2 g/dL — ABNORMAL LOW (ref 30.0–36.0)
MCV: 88.7 fL (ref 80.0–100.0)
Platelets: 334 10*3/uL (ref 150–400)
RBC: 3.82 MIL/uL — ABNORMAL LOW (ref 3.87–5.11)
RDW: 15.9 % — ABNORMAL HIGH (ref 11.5–15.5)
WBC: 6.1 10*3/uL (ref 4.0–10.5)
nRBC: 0 % (ref 0.0–0.2)

## 2021-06-11 LAB — GASTROINTESTINAL PANEL BY PCR, STOOL (REPLACES STOOL CULTURE)

## 2021-06-11 LAB — COMPREHENSIVE METABOLIC PANEL
ALT: 10 U/L (ref 0–44)
AST: 18 U/L (ref 15–41)
Albumin: 3.4 g/dL — ABNORMAL LOW (ref 3.5–5.0)
Alkaline Phosphatase: 48 U/L (ref 38–126)
Anion gap: 8 (ref 5–15)
BUN: 9 mg/dL (ref 6–20)
CO2: 22 mmol/L (ref 22–32)
Calcium: 8.8 mg/dL — ABNORMAL LOW (ref 8.9–10.3)
Chloride: 103 mmol/L (ref 98–111)
Creatinine, Ser: 0.66 mg/dL (ref 0.44–1.00)
GFR, Estimated: >60 mL/min (ref >60)
Glucose, Bld: 59 mg/dL — ABNORMAL LOW (ref 70–99)
Potassium: 4.1 mmol/L (ref 3.5–5.1)
Sodium: 133 mmol/L — ABNORMAL LOW (ref 135–145)
Total Bilirubin: 0.7 mg/dL (ref 0.3–1.2)
Total Protein: 6.6 g/dL (ref 6.5–8.1)

## 2021-06-11 MED ORDER — ADULT MULTIVITAMIN W/MINERALS CH
1.0000 | ORAL_TABLET | Freq: Every day | ORAL | Status: DC
  Administered 2021-06-11 – 2021-06-12 (×2): 1 via ORAL
  Filled 2021-06-11 (×2): qty 1

## 2021-06-11 MED ORDER — DIPHENHYDRAMINE HCL 50 MG/ML IJ SOLN
25.0000 mg | Freq: Four times a day (QID) | INTRAMUSCULAR | Status: DC | PRN
  Administered 2021-06-11 – 2021-06-12 (×6): 25 mg via INTRAVENOUS
  Filled 2021-06-11 (×6): qty 1

## 2021-06-11 MED ORDER — BOOST / RESOURCE BREEZE PO LIQD CUSTOM: 1.0000 | Freq: Two times a day (BID) | ORAL | Status: DC

## 2021-06-11 MED ORDER — KATE FARMS STANDARD 1.4 PO LIQD
325.0000 mL | Freq: Two times a day (BID) | ORAL | Status: DC
  Filled 2021-06-11 (×4): qty 325

## 2021-06-11 NOTE — Plan of Care (Signed)
  Problem: Coping: Goal: Level of anxiety will decrease Outcome: Progressing   Problem: Pain Managment: Goal: General experience of comfort will improve Outcome: Progressing   Problem: Safety: Goal: Ability to remain free from injury will improve Outcome: Progressing   

## 2021-06-11 NOTE — Progress Notes (Signed)
PROGRESS NOTE    Kimberly Valencia  NMM:[id number] DOB: 10/30/1986 DOA: 06/09/2021 PCP: Daryll Drown, NP   Chief Complain: Abdominal pain, diarrhea  Brief Narrative: Patient is a 35 year old female with history of Crohn's disease, Asherman syndrome, chronic pain syndrome, anxiety, hypothyroidism, chronic abdominal pain/diarrhea who presented with complaint of abdominal pain, diarrhea.  On reviewing chart, she had multiple admissions to various hospitals and emergency departments for the same in the past in couple of months.  She was recently admitted at [facility] in [place] and was released on June 03, 2021.  Patient was complaining of several loose bowel movements, unable to tolerate oral intake.  Also reported left lower quadrant/right lower quadrant crampy pain.  On presentation, she was tachycardic in 130s, afebrile.  Blood pressure was stable.  Lab work was essentially normal.  Patient was admitted for the management of intractable nausea and vomiting with abdominal pain.  Assessment & Plan:   Active Problems:   Diarrhea  Abdominal pain/diarrhea: Recurrent problem.  No definite etiology.  Multiple admissions in various hospitals for the same problem.  Tachycardic on presentation.  Started on IV fluids, antiemetics, pain management.  Antibiotics not started.  GI pathogen panel pending, C. difficile antigen positive but toxin is negative so no indication for treatment, likely colonization. She was recently admitted at [facility] having endoscopy with biopsies without finding of inflammatory bowel disease.  She had multiple CT scans since April this year without evidence of Crohn's disease, steroids not started  CT abdomen/pelvis done on 05/20/2021  did not show any inflammation or obstruction, but showed mild thickening of sigmoid colon and several areas, possible mild colitis without definite manifestations of Crohn's disease. Currently being focused on managing  her intractable nausea, vomiting and diarrhea of unknown cause. Will continue current management Currently on clear liquid diet.  Will advance diet to soft.  She says her abdomen pain, diarrhea is slowly getting better. Abdomen is essentially benign on examination. 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  She is originally from [place].  She says she has a primary care physician and gastroenterologist over there  Anxiety: Continue home medications   Hypothyroidism: On Synthyroid  Normocytic anemia: Hemoglobin in the range of 9-10.  Drop in the hemoglobin from 12 to 9.9 today most likely from hemodilution from fluids.  Chronic pain syndrome/opiate dependence: She follows with pain specialist at [place] area.  Chronic problem.        DVT prophylaxis:SCD Code Status: Full Family Communication: None at the bedside Status is: Inpatient  Remains inpatient appropriate because:IV treatments appropriate due to intensity of illness or inability to take PO  Dispo: The patient is from: Home              Anticipated d/c is to: Home              Patient currently is not medically stable to d/c.   Difficult to place patient No     Consultants: None  Procedures:None  Antimicrobials:  Anti-infectives (From admission, onward)    Start     Dose/Rate Route Frequency Ordered Stop   06/10/21 0630  ciprofloxacin (CIPRO) IVPB 400 mg  Status:  Discontinued        400 mg 200 mL/hr over 60 Minutes Intravenous  Once 06/10/21 3159 06/10/21 0734       Subjective:  Patient seen and examined at the bedside this morning.  Hemodynamically stable during my evaluation.  Looks comfortable.  She said her abdomen pain and nausea is improving.  She is still having diarrhea, has not vomited.  She wants to advance her diet to soft.   Objective: Vitals:   06/10/21 2147 06/10/21 2205 06/11/21 0205 06/11/21 0558  BP:  106/67 110/88 123/76  Pulse:  71 78 81  Resp:   18 18 20   Temp: 98.1 F (36.7 C) 98 F (36.7 C) 98.2 F (36.8 C) 98.4 F (36.9 C)  TempSrc:  Oral Oral Oral  SpO2:  99% 100% 98%  Weight:      Height:        Intake/Output Summary (Last 24 hours) at 06/11/2021 0759 Last data filed at 06/11/2021 0300 Gross per 24 hour  Intake 2326.12 ml  Output 1000 ml  Net 1326.12 ml   Filed Weights   06/09/21 2205  Weight: 45.4 kg    Examination:  General exam: Appears calm and comfortable ,Not in distress,average built HEENT:PERRL,Oral mucosa moist, Ear/Nose normal on gross exam Respiratory system: Bilateral equal air entry, normal vesicular breath sounds, no wheezes or crackles  Cardiovascular system: S1 & S2 heard, RRR. No JVD, murmurs, rubs, gallops or clicks. No pedal edema. Gastrointestinal system: Abdomen is nondistended, soft and nontender. No organomegaly or masses felt. Normal bowel sounds heard. Central nervous system: Alert and oriented. No focal neurological deficits. Extremities: No edema, no clubbing ,no cyanosis Skin: No rashes, lesions or ulcers,no icterus ,no pallor    Data Reviewed: I have personally reviewed following labs and imaging studies  CBC: Recent Labs  Lab 06/09/21 2234 06/11/21 0508  WBC 7.3 6.1  HGB 12.8 9.9*  HCT 42.0 33.9*  MCV 84.0 88.7  PLT 532* 334   Basic Metabolic Panel: Recent Labs  Lab 06/09/21 2234 06/11/21 0508  NA 134* 133*  K 3.8 4.1  CL 101 103  CO2 24 22  GLUCOSE 138* 59*  BUN 9 9  CREATININE 0.81 0.66  CALCIUM 10.2 8.8*   GFR: Estimated Creatinine Clearance: 71.7 mL/min (by C-G formula based on SCr of 0.66 mg/dL). Liver Function Tests: Recent Labs  Lab 06/09/21 2234 06/11/21 0508  AST 23 18  ALT 13 10  ALKPHOS 72 48  BILITOT 0.5 0.7  PROT 9.2* 6.6  ALBUMIN 4.6 3.4*   Recent Labs  Lab 06/09/21 2234  LIPASE 21   No results for input(s): AMMONIA in the last 168 hours. Coagulation Profile: No results for input(s): INR, PROTIME in the last 168 hours. Cardiac  Enzymes: No results for input(s): CKTOTAL, CKMB, CKMBINDEX, TROPONINI in the last 168 hours. BNP (last 3 results) No results for input(s): PROBNP in the last 8760 hours. HbA1C: No results for input(s): HGBA1C in the last 72 hours. CBG: No results for input(s): GLUCAP in the last 168 hours. Lipid Profile: No results for input(s): CHOL, HDL, LDLCALC, TRIG, CHOLHDL, LDLDIRECT in the last 72 hours. Thyroid Function Tests: No results for input(s): TSH, T4TOTAL, FREET4, T3FREE, THYROIDAB in the last 72 hours. Anemia Panel: No results for input(s): VITAMINB12, FOLATE, FERRITIN, TIBC, IRON, RETICCTPCT in the last 72 hours. Sepsis Labs: No results for input(s): PROCALCITON, LATICACIDVEN in the last 168 hours.  Recent Results (from the past 240 hour(s))  Resp Panel by RT-PCR (Flu A&B, Covid) Nasopharyngeal Swab     Status: None   Collection Time: 06/10/21  6:46 AM   Specimen: Nasopharyngeal Swab; Nasopharyngeal(NP) swabs in vial transport medium  Result Value Ref Range Status   SARS Coronavirus 2 by RT PCR NEGATIVE NEGATIVE  Final    Comment: (NOTE) SARS-CoV-2 target nucleic acids are NOT DETECTED.  The SARS-CoV-2 RNA is generally detectable in upper respiratory specimens during the acute phase of infection. The lowest concentration of SARS-CoV-2 viral copies this assay can detect is 138 copies/mL. A negative result does not preclude SARS-Cov-2 infection and should not be used as the sole basis for treatment or other patient management decisions. A negative result may occur with  improper specimen collection/handling, submission of specimen other than nasopharyngeal swab, presence of viral mutation(s) within the areas targeted by this assay, and inadequate number of viral copies(<138 copies/mL). A negative result must be combined with clinical observations, patient history, and epidemiological information. The expected result is Negative.  Fact Sheet for Patients:   BloggerCourse.com  Fact Sheet for Healthcare Providers:  SeriousBroker.it  This test is no t yet approved or cleared by the [place] FDA and  has been authorized for detection and/or diagnosis of SARS-CoV-2 by FDA under an Emergency Use Authorization (EUA). This EUA will remain  in effect (meaning this test can be used) for the duration of the COVID-19 declaration under Section 564(b)(1) of the Act, 21 U.S.C.section 360bbb-3(b)(1), unless the authorization is terminated  or revoked sooner.       Influenza A by PCR NEGATIVE NEGATIVE Final   Influenza B by PCR NEGATIVE NEGATIVE Final    Comment: (NOTE) The Xpert Xpress SARS-CoV-2/FLU/RSV plus assay is intended as an aid in the diagnosis of influenza from Nasopharyngeal swab specimens and should not be used as a sole basis for treatment. Nasal washings and aspirates are unacceptable for Xpert Xpress SARS-CoV-2/FLU/RSV testing.  Fact Sheet for Patients: BloggerCourse.com  Fact Sheet for Healthcare Providers: SeriousBroker.it  This test is not yet approved or cleared by the [place] FDA and has been authorized for detection and/or diagnosis of SARS-CoV-2 by FDA under an Emergency Use Authorization (EUA). This EUA will remain in effect (meaning this test can be used) for the duration of the COVID-19 declaration under Section 564(b)(1) of the Act, 21 U.S.C. section 360bbb-3(b)(1), unless the authorization is terminated or revoked.  Performed at [facility], [address] [place], [place] [address]   C Difficile Quick Screen w PCR reflex     Status: Abnormal   Collection Time: 06/10/21  5:10 PM   Specimen: Stool  Result Value Ref Range Status   C Diff antigen POSITIVE (A) NEGATIVE Final   C Diff toxin NEGATIVE NEGATIVE Final   C Diff interpretation Results are indeterminate. See PCR results.  Final     Comment: Performed at [facility], [address], [place], [place] [address]         Radiology Studies: No results found.      Scheduled Meds:  ALPRAZolam  1 mg Oral BID   FLUoxetine  60 mg Oral q morning   gabapentin  200 mg Oral Daily   levothyroxine  88 mcg Oral QAC breakfast   zolpidem  5 mg Oral QHS   Continuous Infusions:  lactated ringers Stopped (06/11/21 0932)   promethazine (PHENERGAN) injection (IM or IVPB) 12.5 mg (06/11/21 0132)     LOS: 1 day    Time spent: 35 mins.More than 50% of that time was spent in counseling and/or coordination of care.      Burnadette Pop, MD [facility] P6/14/2022, 7:59 AM

## 2021-06-12 LAB — CBC WITH DIFFERENTIAL/PLATELET
Abs Immature Granulocytes: 0.02 10*3/uL (ref 0.00–0.07)
Basophils Absolute: 0 10*3/uL (ref 0.0–0.1)
Basophils Relative: 1 %
Eosinophils Absolute: 0.1 10*3/uL (ref 0.0–0.5)
Eosinophils Relative: 2 %
HCT: 35.2 % — ABNORMAL LOW (ref 36.0–46.0)
Hemoglobin: 10.5 g/dL — ABNORMAL LOW (ref 12.0–15.0)
Immature Granulocytes: 0 %
Lymphocytes Relative: 49 %
Lymphs Abs: 2.6 10*3/uL (ref 0.7–4.0)
MCH: 26 pg (ref 26.0–34.0)
MCHC: 29.8 g/dL — ABNORMAL LOW (ref 30.0–36.0)
MCV: 87.1 fL (ref 80.0–100.0)
Monocytes Absolute: 0.6 10*3/uL (ref 0.1–1.0)
Monocytes Relative: 11 %
Neutro Abs: 2 10*3/uL (ref 1.7–7.7)
Neutrophils Relative %: 37 %
Platelets: 390 10*3/uL (ref 150–400)
RBC: 4.04 MIL/uL (ref 3.87–5.11)
RDW: 15.9 % — ABNORMAL HIGH (ref 11.5–15.5)
WBC: 5.3 10*3/uL (ref 4.0–10.5)
nRBC: 0 % (ref 0.0–0.2)

## 2021-06-12 LAB — BASIC METABOLIC PANEL
Anion gap: 7 (ref 5–15)
BUN: 7 mg/dL (ref 6–20)
CO2: 25 mmol/L (ref 22–32)
Calcium: 9.4 mg/dL (ref 8.9–10.3)
Chloride: 103 mmol/L (ref 98–111)
Creatinine, Ser: 0.84 mg/dL (ref 0.44–1.00)
GFR, Estimated: >60 mL/min (ref >60)
Glucose, Bld: 130 mg/dL — ABNORMAL HIGH (ref 70–99)
Potassium: 3.6 mmol/L (ref 3.5–5.1)
Sodium: 135 mmol/L (ref 135–145)

## 2021-06-12 MED ORDER — POTASSIUM CHLORIDE CRYS ER 20 MEQ PO TBCR: 20.0000 meq | EXTENDED_RELEASE_TABLET | Freq: Every day | ORAL | 0 refills | Status: DC

## 2021-06-12 MED ORDER — LOPERAMIDE HCL 2 MG PO CAPS: 2.0000 mg | ORAL_CAPSULE | ORAL | Status: DC | PRN

## 2021-06-12 NOTE — Discharge Summary (Signed)
Physician Discharge Summary  Carlee Vonderhaar LPF:[id number] DOB: [birth year] DOA: 06/09/2021  PCP: Daryll Drown, NP  Admit date: 06/09/2021 Discharge date: 06/12/2021  Admitted From: Home Disposition:  Home  Discharge Condition:Stable CODE STATUS:FULL Diet recommendation:  Regular   Brief/Interim Summary: Patient is a 35 year old female with history of Crohn's disease, Asherman syndrome, chronic pain syndrome, anxiety, hypothyroidism, chronic abdominal pain/diarrhea who presented with complaint of abdominal pain, diarrhea.  On reviewing chart, she had multiple admissions to various hospitals and emergency departments for the same in the past in couple of months.  She was recently admitted at [facility] in [place] and was released on June 03, 2021.  On presentation,patient was complaining of several loose bowel movements, unable to tolerate oral intake.  Also reported left lower quadrant/right lower quadrant crampy pain.  On presentation, she was tachycardic in 130s, afebrile.  Blood pressure was stable.  Lab work was essentially normal.  Patient was admitted for the management of intractable nausea, abdominal pain and diarrhea.  The symptoms have been better with symptomatic management.  Currently she is tolerating regular diet.  She is medically stable for discharge to home today.  Following problems were addressed during her hospitalization:  Abdominal pain/diarrhea: Recurrent problem.  No definite etiology.  Multiple admissions in various hospitals for the same problem. On admission,she  was started on IV fluids, antiemetics, pain management.  Antibiotics not started.  GI pathogen panel negative.C. difficile antigen positive but toxin is negative so no indication for treatment, likely colonization. She was recently admitted at [facility] having endoscopy with biopsies without finding of inflammatory bowel disease.  She had multiple CT scans since April this year  without evidence of inflammatory bowel disease, so steroids not started  CT abdomen/pelvis done on 05/20/2021  did not show any inflammation or obstruction, but showed mild thickening of sigmoid colon and several areas, possible mild colitis without definite manifestations of Crohn's disease.  She says her abdomen pain, diarrhea is slowly getting better.  She tolerated regular diet today. Abdomen is essentially benign on examination. 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  She is originally from [place].  She says she has a primary care physician and gastroenterologist over there   Anxiety: Continue home medications   Hypothyroidism: On Synthyroid   Normocytic anemia: Hemoglobin in the range of 9-10.  Drop in the hemoglobin from 12 to 9-10 most likely from hemodilution from fluids.   Chronic pain syndrome/opiate dependence: She follows with pain specialist at [place].  Chronic problem.      Discharge Diagnoses:  Active Problems:   Diarrhea    Discharge Instructions  Discharge Instructions     Diet general   Complete by: As directed    Discharge instructions   Complete by: As directed    1)Please continue your home medications 2)Follow up with your PCP in a week   Increase activity slowly   Complete by: As directed       Allergies as of 06/12/2021       Reactions   Trazodone    Other reaction(s): Other   Bentyl [dicyclomine Hcl]    Compazine [prochlorperazine]    Reglan [metoclopramide]    Sulfa Antibiotics    Tramadol Rash        Medication List     STOP taking these medications    Potassium Chloride ER 20 MEQ Tbcr       TAKE these medications  ALPRAZolam 1 MG tablet Commonly known as: XANAX Take 1 tablet (1 mg total) by mouth 2 (two) times daily.   FLUoxetine 20 MG capsule Commonly known as: PROZAC Take 3 capsules (60 mg total) by mouth every morning.   gabapentin 100 MG capsule Commonly known as:  NEURONTIN Take 200 mg by mouth daily.   levothyroxine 88 MCG tablet Commonly known as: SYNTHROID Take 1 tablet (88 mcg total) by mouth daily before breakfast.   ondansetron 4 MG disintegrating tablet Commonly known as: Zofran ODT 4mg  ODT q4 hours prn nausea/vomit   Oxycodone HCl 10 MG Tabs Take 10 mg by mouth every 4 (four) hours as needed for pain.   potassium chloride SA 20 MEQ tablet Commonly known as: KLOR-CON Take 1 tablet (20 mEq total) by mouth daily for 7 days.   Xtampza ER 9 MG C12a Generic drug: oxyCODONE ER Take 9 mg by mouth daily.   zolpidem 10 MG tablet Commonly known as: AMBIEN Take 10 mg by mouth at bedtime.        Follow-up Information     Daryll DrownIjaola, Onyeje M, NP. Schedule an appointment as soon as possible for a visit in 1 week(s).   Specialty: Nurse Practitioner Contact information: [address] [place] [address] [phone]                Allergies  Allergen Reactions   Trazodone     Other reaction(s): Other   Bentyl [Dicyclomine Hcl]    Compazine [Prochlorperazine]    Reglan [Metoclopramide]    Sulfa Antibiotics    Tramadol Rash    Consultations: None   Procedures/Studies: CT Abdomen Pelvis Wo Contrast  Result Date: 05/28/2021 CLINICAL DATA:  Generalized abdominal pain, history of colitis EXAM: CT ABDOMEN AND PELVIS WITHOUT CONTRAST TECHNIQUE: Multidetector CT imaging of the abdomen and pelvis was performed following the standard protocol without IV contrast. COMPARISON:  05/04/2021 FINDINGS: Lower chest: Bilateral breast implants are noted. The lung bases are clear. Hepatobiliary: No focal liver abnormality is seen. No gallstones, gallbladder wall thickening, or biliary dilatation. Pancreas: Unremarkable. No pancreatic ductal dilatation or surrounding inflammatory changes. Spleen: Normal in size without focal abnormality. Adrenals/Urinary Tract: Adrenal glands are within normal limits. Kidneys demonstrate no renal calculi or  obstructive changes. The ureters are unremarkable. Bladder is decompressed. Stomach/Bowel: No obstructive or inflammatory changes of the colon are noted. The appendix is within normal limits. Small bowel shows no focal abnormality. No inflammatory changes are seen. Stomach is within normal limits. Vascular/Lymphatic: No significant vascular findings are present. No enlarged abdominal or pelvic lymph nodes. Reproductive: Uterus and bilateral adnexa are unremarkable. Other: No abdominal wall hernia or abnormality. No abdominopelvic ascites. Musculoskeletal: No bony abnormality is noted. IMPRESSION: Changes in the distal small bowel seen on the prior exam are not well borne out on today's study. No inflammatory changes are noted. No other focal abnormality is noted. Electronically Signed   By: Alcide CleverMark  Lukens M.D.   On: 05/28/2021 18:50      Subjective:  Patient seen and examined at the bedside this morning.  Medically stable for discharge.   Discharge Exam: Vitals:   06/11/21 1933 06/12/21 0220  BP: (!) 106/56 119/65  Pulse: 86 91  Resp: 18 18  Temp: 97.9 F (36.6 C) 98.9 F (37.2 C)  SpO2: 100% 100%   Vitals:   06/11/21 1008 06/11/21 1252 06/11/21 1933 06/12/21 0220  BP: 113/86 104/70 (!) 106/56 119/65  Pulse: 86 (!) 109 86 91  Resp: 16  14 18 18   Temp: 98.2 F (36.8 C) 98.6 F (37 C) 97.9 F (36.6 C) 98.9 F (37.2 C)  TempSrc: Oral Oral Oral Oral  SpO2: 97% 100% 100% 100%  Weight:      Height:        General: Pt is alert, awake, not in acute distress Cardiovascular: RRR, S1/S2 +, no rubs, no gallops Respiratory: CTA bilaterally, no wheezing, no rhonchi Abdominal: Soft, NT, ND, bowel sounds + Extremities: no edema, no cyanosis    The results of significant diagnostics from this hospitalization (including imaging, microbiology, ancillary and laboratory) are listed below for reference.     Microbiology: Recent Results (from the past 240 hour(s))  Resp Panel by RT-PCR (Flu  A&B, Covid) Nasopharyngeal Swab     Status: None   Collection Time: 06/10/21  6:46 AM   Specimen: Nasopharyngeal Swab; Nasopharyngeal(NP) swabs in vial transport medium  Result Value Ref Range Status   SARS Coronavirus 2 by RT PCR NEGATIVE NEGATIVE Final    Comment: (NOTE) SARS-CoV-2 target nucleic acids are NOT DETECTED.  The SARS-CoV-2 RNA is generally detectable in upper respiratory specimens during the acute phase of infection. The lowest concentration of SARS-CoV-2 viral copies this assay can detect is 138 copies/mL. A negative result does not preclude SARS-Cov-2 infection and should not be used as the sole basis for treatment or other patient management decisions. A negative result may occur with  improper specimen collection/handling, submission of specimen other than nasopharyngeal swab, presence of viral mutation(s) within the areas targeted by this assay, and inadequate number of viral copies(<138 copies/mL). A negative result must be combined with clinical observations, patient history, and epidemiological information. The expected result is Negative.  Fact Sheet for Patients:  BloggerCourse.com  Fact Sheet for Healthcare Providers:  SeriousBroker.it  This test is no t yet approved or cleared by the [place] FDA and  has been authorized for detection and/or diagnosis of SARS-CoV-2 by FDA under an Emergency Use Authorization (EUA). This EUA will remain  in effect (meaning this test can be used) for the duration of the COVID-19 declaration under Section 564(b)(1) of the Act, 21 U.S.C.section 360bbb-3(b)(1), unless the authorization is terminated  or revoked sooner.       Influenza A by PCR NEGATIVE NEGATIVE Final   Influenza B by PCR NEGATIVE NEGATIVE Final    Comment: (NOTE) The Xpert Xpress SARS-CoV-2/FLU/RSV plus assay is intended as an aid in the diagnosis of influenza from Nasopharyngeal swab specimens  and should not be used as a sole basis for treatment. Nasal washings and aspirates are unacceptable for Xpert Xpress SARS-CoV-2/FLU/RSV testing.  Fact Sheet for Patients: BloggerCourse.com  Fact Sheet for Healthcare Providers: SeriousBroker.it  This test is not yet approved or cleared by the [place] FDA and has been authorized for detection and/or diagnosis of SARS-CoV-2 by FDA under an Emergency Use Authorization (EUA). This EUA will remain in effect (meaning this test can be used) for the duration of the COVID-19 declaration under Section 564(b)(1) of the Act, 21 U.S.C. section 360bbb-3(b)(1), unless the authorization is terminated or revoked.  Performed at [facility], [address], [place], [place] [address]   C Difficile Quick Screen w PCR reflex     Status: Abnormal   Collection Time: 06/10/21  5:10 PM   Specimen: Stool  Result Value Ref Range Status   C Diff antigen POSITIVE (A) NEGATIVE Final   C Diff toxin NEGATIVE NEGATIVE Final   C Diff interpretation Results  are indeterminate. See PCR results.  Final    Comment: Performed at [facility], [address] [place], [place] [address]  Gastrointestinal Panel by PCR , Stool     Status: None   Collection Time: 06/10/21  5:15 PM   Specimen: Stool  Result Value Ref Range Status   Campylobacter species NOT DETECTED NOT DETECTED Final   Plesimonas shigelloides NOT DETECTED NOT DETECTED Final   Salmonella species NOT DETECTED NOT DETECTED Final   Yersinia enterocolitica NOT DETECTED NOT DETECTED Final   Vibrio species NOT DETECTED NOT DETECTED Final   Vibrio cholerae NOT DETECTED NOT DETECTED Final   Enteroaggregative E coli (EAEC) NOT DETECTED NOT DETECTED Final   Enteropathogenic E coli (EPEC) NOT DETECTED NOT DETECTED Final   Enterotoxigenic E coli (ETEC) NOT DETECTED NOT DETECTED Final   Shiga like toxin producing E coli  (STEC) NOT DETECTED NOT DETECTED Final   Shigella/Enteroinvasive E coli (EIEC) NOT DETECTED NOT DETECTED Final   Cryptosporidium NOT DETECTED NOT DETECTED Final   Cyclospora cayetanensis NOT DETECTED NOT DETECTED Final   Entamoeba histolytica NOT DETECTED NOT DETECTED Final   Giardia lamblia NOT DETECTED NOT DETECTED Final   Adenovirus F40/41 NOT DETECTED NOT DETECTED Final   Astrovirus NOT DETECTED NOT DETECTED Final   Norovirus GI/GII NOT DETECTED NOT DETECTED Final   Rotavirus A NOT DETECTED NOT DETECTED Final   Sapovirus (I, II, IV, and V) NOT DETECTED NOT DETECTED Final    Comment: Performed at [facility], [address] [place], [place] [address]     Labs: BNP (last 3 results) No results for input(s): BNP in the last 8760 hours. Basic Metabolic Panel: Recent Labs  Lab 06/09/21 2234 06/11/21 0508 06/12/21 0524  NA 134* 133* 135  K 3.8 4.1 3.6  CL 101 103 103  CO2 24 22 25   GLUCOSE 138* 59* 130*  BUN 9 9 7   CREATININE 0.81 0.66 0.84  CALCIUM 10.2 8.8* 9.4   Liver Function Tests: Recent Labs  Lab 06/09/21 2234 06/11/21 0508  AST 23 18  ALT 13 10  ALKPHOS 72 48  BILITOT 0.5 0.7  PROT 9.2* 6.6  ALBUMIN 4.6 3.4*   Recent Labs  Lab 06/09/21 2234  LIPASE 21   No results for input(s): AMMONIA in the last 168 hours. CBC: Recent Labs  Lab 06/09/21 2234 06/11/21 0508 06/12/21 0524  WBC 7.3 6.1 5.3  NEUTROABS  --   --  2.0  HGB 12.8 9.9* 10.5*  HCT 42.0 33.9* 35.2*  MCV 84.0 88.7 87.1  PLT 532* 334 390   Cardiac Enzymes: No results for input(s): CKTOTAL, CKMB, CKMBINDEX, TROPONINI in the last 168 hours. BNP: Invalid input(s): POCBNP CBG: No results for input(s): GLUCAP in the last 168 hours. D-Dimer No results for input(s): DDIMER in the last 72 hours. Hgb A1c No results for input(s): HGBA1C in the last 72 hours. Lipid Profile No results for input(s): CHOL, HDL, LDLCALC, TRIG, CHOLHDL, LDLDIRECT in the last 72 hours. Thyroid function  studies No results for input(s): TSH, T4TOTAL, T3FREE, THYROIDAB in the last 72 hours.  Invalid input(s): FREET3 Anemia work up No results for input(s): VITAMINB12, FOLATE, FERRITIN, TIBC, IRON, RETICCTPCT in the last 72 hours. Urinalysis    Component Value Date/Time   COLORURINE YELLOW 06/10/2021 1034   APPEARANCEUR CLEAR 06/10/2021 1034   LABSPEC 1.016 06/10/2021 1034   PHURINE 6.0 06/10/2021 1034   GLUCOSEU NEGATIVE 06/10/2021 1034   HGBUR NEGATIVE 06/10/2021 1034  BILIRUBINUR NEGATIVE 06/10/2021 1034   KETONESUR NEGATIVE 06/10/2021 1034   PROTEINUR NEGATIVE 06/10/2021 1034   NITRITE NEGATIVE 06/10/2021 1034   LEUKOCYTESUR NEGATIVE 06/10/2021 1034   Sepsis Labs Invalid input(s): PROCALCITONIN,  WBC,  LACTICIDVEN Microbiology Recent Results (from the past 240 hour(s))  Resp Panel by RT-PCR (Flu A&B, Covid) Nasopharyngeal Swab     Status: None   Collection Time: 06/10/21  6:46 AM   Specimen: Nasopharyngeal Swab; Nasopharyngeal(NP) swabs in vial transport medium  Result Value Ref Range Status   SARS Coronavirus 2 by RT PCR NEGATIVE NEGATIVE Final    Comment: (NOTE) SARS-CoV-2 target nucleic acids are NOT DETECTED.  The SARS-CoV-2 RNA is generally detectable in upper respiratory specimens during the acute phase of infection. The lowest concentration of SARS-CoV-2 viral copies this assay can detect is 138 copies/mL. A negative result does not preclude SARS-Cov-2 infection and should not be used as the sole basis for treatment or other patient management decisions. A negative result may occur with  improper specimen collection/handling, submission of specimen other than nasopharyngeal swab, presence of viral mutation(s) within the areas targeted by this assay, and inadequate number of viral copies(<138 copies/mL). A negative result must be combined with clinical observations, patient history, and epidemiological information. The expected result is Negative.  Fact Sheet for  Patients:  BloggerCourse.com  Fact Sheet for Healthcare Providers:  SeriousBroker.it  This test is no t yet approved or cleared by the [place] FDA and  has been authorized for detection and/or diagnosis of SARS-CoV-2 by FDA under an Emergency Use Authorization (EUA). This EUA will remain  in effect (meaning this test can be used) for the duration of the COVID-19 declaration under Section 564(b)(1) of the Act, 21 U.S.C.section 360bbb-3(b)(1), unless the authorization is terminated  or revoked sooner.       Influenza A by PCR NEGATIVE NEGATIVE Final   Influenza B by PCR NEGATIVE NEGATIVE Final    Comment: (NOTE) The Xpert Xpress SARS-CoV-2/FLU/RSV plus assay is intended as an aid in the diagnosis of influenza from Nasopharyngeal swab specimens and should not be used as a sole basis for treatment. Nasal washings and aspirates are unacceptable for Xpert Xpress SARS-CoV-2/FLU/RSV testing.  Fact Sheet for Patients: BloggerCourse.com  Fact Sheet for Healthcare Providers: SeriousBroker.it  This test is not yet approved or cleared by the [place] FDA and has been authorized for detection and/or diagnosis of SARS-CoV-2 by FDA under an Emergency Use Authorization (EUA). This EUA will remain in effect (meaning this test can be used) for the duration of the COVID-19 declaration under Section 564(b)(1) of the Act, 21 U.S.C. section 360bbb-3(b)(1), unless the authorization is terminated or revoked.  Performed at [facility], [address] [place], [place] [address]   C Difficile Quick Screen w PCR reflex     Status: Abnormal   Collection Time: 06/10/21  5:10 PM   Specimen: Stool  Result Value Ref Range Status   C Diff antigen POSITIVE (A) NEGATIVE Final   C Diff toxin NEGATIVE NEGATIVE Final   C Diff interpretation Results are indeterminate. See PCR  results.  Final    Comment: Performed at [facility], [address], [place], [place] [address]  Gastrointestinal Panel by PCR , Stool     Status: None   Collection Time: 06/10/21  5:15 PM   Specimen: Stool  Result Value Ref Range Status   Campylobacter species NOT DETECTED NOT DETECTED Final   Plesimonas shigelloides NOT DETECTED NOT  DETECTED Final   Salmonella species NOT DETECTED NOT DETECTED Final   Yersinia enterocolitica NOT DETECTED NOT DETECTED Final   Vibrio species NOT DETECTED NOT DETECTED Final   Vibrio cholerae NOT DETECTED NOT DETECTED Final   Enteroaggregative E coli (EAEC) NOT DETECTED NOT DETECTED Final   Enteropathogenic E coli (EPEC) NOT DETECTED NOT DETECTED Final   Enterotoxigenic E coli (ETEC) NOT DETECTED NOT DETECTED Final   Shiga like toxin producing E coli (STEC) NOT DETECTED NOT DETECTED Final   Shigella/Enteroinvasive E coli (EIEC) NOT DETECTED NOT DETECTED Final   Cryptosporidium NOT DETECTED NOT DETECTED Final   Cyclospora cayetanensis NOT DETECTED NOT DETECTED Final   Entamoeba histolytica NOT DETECTED NOT DETECTED Final   Giardia lamblia NOT DETECTED NOT DETECTED Final   Adenovirus F40/41 NOT DETECTED NOT DETECTED Final   Astrovirus NOT DETECTED NOT DETECTED Final   Norovirus GI/GII NOT DETECTED NOT DETECTED Final   Rotavirus A NOT DETECTED NOT DETECTED Final   Sapovirus (I, II, IV, and V) NOT DETECTED NOT DETECTED Final    Comment: Performed at [facility], [address] [place], [place] [address]    Please note: You were cared for by a hospitalist during your hospital stay. Once you are discharged, your primary care physician will handle any further medical issues. Please note that NO REFILLS for any discharge medications will be authorized once you are discharged, as it is imperative that you return to your primary care physician (or establish a relationship with a primary care physician if you do not have one) for  your post hospital discharge needs so that they can reassess your need for medications and monitor your lab values.    Time coordinating discharge: 40 minutes  SIGNED:   Burnadette Pop, MD  [facility] 06/12/2021, 12:58 PM Pager [phone]  If 7PM-7AM, please contact night-coverage [email or web address] Password [email or web address]

## 2021-06-13 ENCOUNTER — Telehealth: Payer: Self-pay

## 2021-06-13 NOTE — Telephone Encounter (Signed)
Transition Care Management Unsuccessful Follow-up Telephone Call  Date of discharge and from where:  06/12/21 [facility]  Diagnosis: diarrhea/ colitis   Attempts:  1st Attempt  Reason for unsuccessful TCM follow-up call:  Voice mail full

## 2021-06-16 ENCOUNTER — Other Ambulatory Visit: Payer: Self-pay

## 2021-06-16 ENCOUNTER — Inpatient Hospital Stay (HOSPITAL_COMMUNITY)
Admission: EM | Admit: 2021-06-16 | Discharge: 2021-06-19 | DRG: 689 | Disposition: A | Discharge status: Home / Self Care | Payer: 59 | Attending: Family Medicine | Admitting: Family Medicine

## 2021-06-16 ENCOUNTER — Encounter (HOSPITAL_COMMUNITY): Payer: Self-pay

## 2021-06-16 DIAGNOSIS — R109 Unspecified abdominal pain: Secondary | ICD-10-CM | POA: Diagnosis present

## 2021-06-16 DIAGNOSIS — F419 Anxiety disorder, unspecified: Secondary | ICD-10-CM | POA: Diagnosis present

## 2021-06-16 DIAGNOSIS — E43 Unspecified severe protein-calorie malnutrition: Secondary | ICD-10-CM | POA: Diagnosis present

## 2021-06-16 DIAGNOSIS — K5090 Crohn's disease, unspecified, without complications: Secondary | ICD-10-CM | POA: Diagnosis present

## 2021-06-16 DIAGNOSIS — B9620 Unspecified Escherichia coli [E. coli] as the cause of diseases classified elsewhere: Secondary | ICD-10-CM | POA: Diagnosis present

## 2021-06-16 DIAGNOSIS — G8929 Other chronic pain: Secondary | ICD-10-CM | POA: Diagnosis present

## 2021-06-16 DIAGNOSIS — N10 Acute tubulo-interstitial nephritis: Secondary | ICD-10-CM | POA: Diagnosis not present

## 2021-06-16 DIAGNOSIS — D6489 Other specified anemias: Secondary | ICD-10-CM | POA: Diagnosis present

## 2021-06-16 DIAGNOSIS — E063 Autoimmune thyroiditis: Secondary | ICD-10-CM | POA: Diagnosis present

## 2021-06-16 DIAGNOSIS — Z79899 Other long term (current) drug therapy: Secondary | ICD-10-CM

## 2021-06-16 DIAGNOSIS — R112 Nausea with vomiting, unspecified: Secondary | ICD-10-CM | POA: Diagnosis not present

## 2021-06-16 DIAGNOSIS — Z20822 Contact with and (suspected) exposure to covid-19: Secondary | ICD-10-CM | POA: Diagnosis present

## 2021-06-16 DIAGNOSIS — G894 Chronic pain syndrome: Secondary | ICD-10-CM | POA: Diagnosis present

## 2021-06-16 DIAGNOSIS — N12 Tubulo-interstitial nephritis, not specified as acute or chronic: Secondary | ICD-10-CM

## 2021-06-16 DIAGNOSIS — Z87891 Personal history of nicotine dependence: Secondary | ICD-10-CM

## 2021-06-16 DIAGNOSIS — R197 Diarrhea, unspecified: Secondary | ICD-10-CM | POA: Diagnosis present

## 2021-06-16 DIAGNOSIS — Z681 Body mass index (BMI) 19 or less, adult: Secondary | ICD-10-CM

## 2021-06-16 DIAGNOSIS — E871 Hypo-osmolality and hyponatremia: Secondary | ICD-10-CM | POA: Diagnosis present

## 2021-06-16 DIAGNOSIS — Z79890 Hormone replacement therapy (postmenopausal): Secondary | ICD-10-CM

## 2021-06-16 DIAGNOSIS — Z8249 Family history of ischemic heart disease and other diseases of the circulatory system: Secondary | ICD-10-CM

## 2021-06-16 DIAGNOSIS — F32A Depression, unspecified: Secondary | ICD-10-CM | POA: Diagnosis present

## 2021-06-16 MED ORDER — LACTATED RINGERS IV BOLUS
1000.0000 mL | Freq: Once | INTRAVENOUS | Status: AC
  Administered 2021-06-17: 1000 mL via INTRAVENOUS

## 2021-06-16 MED ORDER — HYDROMORPHONE HCL 1 MG/ML IJ SOLN
1.0000 mg | Freq: Once | INTRAMUSCULAR | Status: AC
  Administered 2021-06-17: 1 mg via INTRAVENOUS
  Filled 2021-06-16: qty 1

## 2021-06-16 MED ORDER — ONDANSETRON HCL 4 MG/2ML IJ SOLN
4.0000 mg | Freq: Once | INTRAMUSCULAR | Status: AC
  Administered 2021-06-17: 4 mg via INTRAVENOUS
  Filled 2021-06-16: qty 2

## 2021-06-16 NOTE — ED Triage Notes (Signed)
Pt to ED from home with c/o abdominal pain and R sided flank pain. Pt was diagnosed with colitis which has not cleared up with prescribed meds. Pt has a hx of crohns. Arrives A+O, VSS, ovious pain noted.

## 2021-06-16 NOTE — ED Provider Notes (Signed)
[facility]-EMERGENCY DEPT [facility] Emergency Department Provider Note MRN:  [id number]  Arrival date & time: 06/17/21     Chief Complaint   Abdominal Pain   History of Present Illness   Kimberly Valencia is a 35 y.o. year-old female with a history of Crohn's disease presenting to the ED with chief complaint of abdominal pain.  Location: Right flank radiating into the right lower quadrant Duration: 1 or 2 days Onset: Gradual Timing: Constant Description: Sharp Severity: Moderate to severe Exacerbating/Alleviating Factors: None Associated Symptoms: Nausea, poor p.o. intake, dysuria Pertinent Negatives: Denies fever, no chest pain or shortness of breath   Review of Systems  A complete 10 system review of systems was obtained and all systems are negative except as noted in the HPI and PMH.   Patient's Health History    Past Medical History:  Diagnosis Date   Anxiety    Asherman syndrome    Clostridium difficile infection    Crohn disease (HCC)    Depression    Hashimoto's disease     Past Surgical History:  Procedure Laterality Date   BREAST ENHANCEMENT SURGERY     DILATION AND CURETTAGE OF UTERUS     TONSILLECTOMY      Family History  Problem Relation Age of Onset   Hyperlipidemia Mother    Hypertension Father     Social History   Socioeconomic History   Marital status: Divorced    Spouse name: Not on file   Number of children: Not on file   Years of education: Not on file   Highest education level: Not on file  Occupational History   Occupation: travel nurse  Tobacco Use   Smoking status: Former    Packs/day: 0.50    Years: 2.00    Pack years: 1.00    Types: Cigarettes    Quit date: 2020    Years since quitting: 2.4   Smokeless tobacco: Never  Vaping Use   Vaping Use: Never used  Substance and Sexual Activity   Alcohol use: Yes    Comment: Socially   Drug use: Never   Sexual activity: Yes    Birth control/protection: None  Other Topics Concern    Not on file  Social History Narrative   Not on file   Social Determinants of Health   Financial Resource Strain: Not on file  Food Insecurity: Not on file  Transportation Needs: Not on file  Physical Activity: Not on file  Stress: Not on file  Social Connections: Not on file  Intimate Partner Violence: Not on file     Physical Exam   Vitals:   06/17/21 0300 06/17/21 0330  BP: 98/61 102/67  Pulse: 86 82  Resp: (!) 24 15  Temp:    SpO2: 100% 98%    CONSTITUTIONAL: Well-appearing, NAD NEURO:  Alert and oriented x 3, no focal deficits EYES:  eyes equal and reactive ENT/NECK:  no LAD, no JVD CARDIO: Regular rate, well-perfused, normal S1 and S2 PULM:  CTAB no wheezing or rhonchi GI/GU:  normal bowel sounds, non-distended, non-tender; right CVA tenderness MSK/SPINE:  No gross deformities, no edema SKIN:  no rash, atraumatic PSYCH:  Appropriate speech and behavior  *Additional and/or pertinent findings included in MDM below  Diagnostic and Interventional Summary    EKG Interpretation  Date/Time:    Ventricular Rate:    PR Interval:    QRS Duration:   QT Interval:    QTC Calculation:   R Axis:     Text  Interpretation:          Labs Reviewed  COMPREHENSIVE METABOLIC PANEL - Abnormal; Notable for the following components:      Result Value   Glucose, Bld 101 (*)    Total Protein 8.4 (*)    All other components within normal limits  CBC WITH DIFFERENTIAL/PLATELET - Abnormal; Notable for the following components:   Hemoglobin 11.2 (*)    MCH 25.7 (*)    RDW 15.6 (*)    Platelets 493 (*)    All other components within normal limits  URINALYSIS, ROUTINE W REFLEX MICROSCOPIC - Abnormal; Notable for the following components:   APPearance HAZY (*)    Ketones, ur 20 (*)    Protein, ur 30 (*)    Nitrite POSITIVE (*)    Leukocytes,Ua MODERATE (*)    WBC, UA >50 (*)    Bacteria, UA MANY (*)    All other components within normal limits  URINE CULTURE  RESP  PANEL BY RT-PCR (FLU A&B, COVID) ARPGX2  LACTIC ACID, PLASMA  LIPASE, BLOOD  I-STAT BETA HCG BLOOD, ED ([facility], [facility], [facility] ONLY)    No orders to display    Medications  haloperidol lactate (HALDOL) injection 2 mg (2 mg Intravenous Patient Refused/Not Given 06/17/21 0205)  cefTRIAXone (ROCEPHIN) 1 g in sodium chloride 0.9 % 100 mL IVPB (0 g Intravenous Stopped 06/17/21 0342)  ondansetron (ZOFRAN) injection 4 mg (4 mg Intravenous Given 06/17/21 0124)  HYDROmorphone (DILAUDID) injection 1 mg (1 mg Intravenous Given 06/17/21 0124)  lactated ringers bolus 1,000 mL (0 mLs Intravenous Stopped 06/17/21 0302)  ketorolac (TORADOL) 15 MG/ML injection 15 mg (15 mg Intravenous Given 06/17/21 0210)  diphenhydrAMINE (BENADRYL) injection 50 mg (50 mg Intravenous Given 06/17/21 0205)  HYDROmorphone (DILAUDID) injection 1 mg (1 mg Intravenous Given 06/17/21 0320)     Procedures  /  Critical Care Ultrasound ED Peripheral IV (Provider)  Date/Time: 06/17/2021 3:17 AM Performed by: Sabas Sous, MD Authorized by: Sabas Sous, MD   Procedure details:    Indications: multiple failed IV attempts     Skin Prep: chlorhexidine gluconate     Location: left basilic vein.   Angiocath:  20 G   Bedside Ultrasound Guided: Yes     Patient tolerated procedure without complications: Yes     Dressing applied: Yes    ED Course and Medical Decision Making  I have reviewed the triage vital signs, the nursing notes, and pertinent available records from the EMR.  Listed above are laboratory and imaging tests that I personally ordered, reviewed, and interpreted and then considered in my medical decision making (see below for details).  Suspect pyelonephritis.  Recent admission for abdominal pain, history of Crohn's but CT on prior admission with no clear-cut evidence of Crohn's.  Seems to have more of a history of chronic abdominal pain.  This pain is different and with the dysuria seems more consistent with pyelonephritis.  Doubt  kidney stone.  Providing pain control, awaiting labs, urinalysis.     Urinalysis showing evidence of infection, supporting the diagnosis of pyelonephritis.  Patient requiring multiple rounds of symptomatic medications without a lot of success, will request hospitalist admission.  Elmer Sow. Pilar Plate, MD [facility] Emergency Medicine [facility] [email or web address]  Final Clinical Impressions(s) / ED Diagnoses     ICD-10-CM   1. Pyelonephritis  N12       ED Discharge Orders     None        Discharge  Instructions Discussed with and Provided to Patient:   Discharge Instructions   None       Sabas Sous, MD 06/17/21 [phone]

## 2021-06-16 NOTE — ED Provider Notes (Signed)
Emergency Medicine Provider Triage Evaluation Note  Kimberly Valencia , a 35 y.o. female  was evaluated in triage.  Pt complains of abdominal pain.  She has been treated for colitis and feels like she is not improving.  She has now developed dysuria over the past three days with pain radiating into the left side back.    Review of Systems  Positive: Dysuria, abdominal pain Negative: fevers  Physical Exam  BP (!) 163/86 (BP Location: Right Arm)   Pulse (!) 129   Temp 98.5 F (36.9 C) (Oral)   Resp 18   Ht 5\' 1"  (1.549 m)   Wt 45.4 kg   LMP 05/17/2021 Comment: ashermans disease,neg preg test  SpO2 100%   BMI 18.89 kg/m  Gen:   Awake, appears uncomfortable, tearful Resp:  Normal effort  MSK:   Moves extremities without difficulty  Other:  Speech is not slurred.  Tachycardic.    Medical Decision Making  Medically screening exam initiated at 10:42 PM.  Appropriate orders placed.  Kimberly Valencia was informed that the remainder of the evaluation will be completed by another provider, this initial triage assessment does not replace that evaluation, and the importance of remaining in the ED until their evaluation is complete.   Patient appears uncomfortable and to be in pain.  She is tachycardic to support the pain.  She is in pain management, states that dilaudid and zofran help her symptoms.  Chart review supports her report of getting 1mg  of dilaudid so this is ordered.     Terrance Mass, PA-C 06/16/21 2245    Cristina Gong, MD 06/17/21 1004

## 2021-06-17 ENCOUNTER — Encounter (HOSPITAL_COMMUNITY): Payer: Self-pay | Admitting: Family Medicine

## 2021-06-17 DIAGNOSIS — G8929 Other chronic pain: Secondary | ICD-10-CM | POA: Diagnosis not present

## 2021-06-17 DIAGNOSIS — D6489 Other specified anemias: Secondary | ICD-10-CM | POA: Diagnosis present

## 2021-06-17 DIAGNOSIS — N12 Tubulo-interstitial nephritis, not specified as acute or chronic: Secondary | ICD-10-CM | POA: Diagnosis not present

## 2021-06-17 DIAGNOSIS — Z681 Body mass index (BMI) 19 or less, adult: Secondary | ICD-10-CM | POA: Diagnosis not present

## 2021-06-17 DIAGNOSIS — Z20822 Contact with and (suspected) exposure to covid-19: Secondary | ICD-10-CM | POA: Diagnosis present

## 2021-06-17 DIAGNOSIS — R112 Nausea with vomiting, unspecified: Secondary | ICD-10-CM | POA: Diagnosis present

## 2021-06-17 DIAGNOSIS — N390 Urinary tract infection, site not specified: Secondary | ICD-10-CM | POA: Diagnosis not present

## 2021-06-17 DIAGNOSIS — R109 Unspecified abdominal pain: Secondary | ICD-10-CM | POA: Diagnosis not present

## 2021-06-17 DIAGNOSIS — E43 Unspecified severe protein-calorie malnutrition: Secondary | ICD-10-CM | POA: Diagnosis present

## 2021-06-17 DIAGNOSIS — Z79899 Other long term (current) drug therapy: Secondary | ICD-10-CM | POA: Diagnosis not present

## 2021-06-17 DIAGNOSIS — E871 Hypo-osmolality and hyponatremia: Secondary | ICD-10-CM | POA: Diagnosis present

## 2021-06-17 DIAGNOSIS — Z8249 Family history of ischemic heart disease and other diseases of the circulatory system: Secondary | ICD-10-CM | POA: Diagnosis not present

## 2021-06-17 DIAGNOSIS — E063 Autoimmune thyroiditis: Secondary | ICD-10-CM | POA: Diagnosis present

## 2021-06-17 DIAGNOSIS — G894 Chronic pain syndrome: Secondary | ICD-10-CM | POA: Diagnosis present

## 2021-06-17 DIAGNOSIS — N10 Acute tubulo-interstitial nephritis: Secondary | ICD-10-CM | POA: Diagnosis present

## 2021-06-17 DIAGNOSIS — F419 Anxiety disorder, unspecified: Secondary | ICD-10-CM | POA: Diagnosis present

## 2021-06-17 DIAGNOSIS — Z87891 Personal history of nicotine dependence: Secondary | ICD-10-CM | POA: Diagnosis not present

## 2021-06-17 DIAGNOSIS — F32A Depression, unspecified: Secondary | ICD-10-CM | POA: Diagnosis present

## 2021-06-17 DIAGNOSIS — Z79890 Hormone replacement therapy (postmenopausal): Secondary | ICD-10-CM | POA: Diagnosis not present

## 2021-06-17 DIAGNOSIS — B9620 Unspecified Escherichia coli [E. coli] as the cause of diseases classified elsewhere: Secondary | ICD-10-CM | POA: Diagnosis present

## 2021-06-17 LAB — RESP PANEL BY RT-PCR (FLU A&B, COVID) ARPGX2
Influenza A by PCR: NEGATIVE
Influenza B by PCR: NEGATIVE
SARS Coronavirus 2 by RT PCR: NEGATIVE

## 2021-06-17 LAB — URINALYSIS, ROUTINE W REFLEX MICROSCOPIC
Bilirubin Urine: NEGATIVE
Glucose, UA: NEGATIVE mg/dL
Hgb urine dipstick: NEGATIVE
Ketones, ur: 20 mg/dL — AB
Nitrite: POSITIVE — AB
Protein, ur: 30 mg/dL — AB
Specific Gravity, Urine: 1.02 (ref 1.005–1.030)
WBC, UA: >50 WBC/hpf — ABNORMAL HIGH (ref 0–5)
pH: 7 (ref 5.0–8.0)

## 2021-06-17 LAB — CBC WITH DIFFERENTIAL/PLATELET
Abs Immature Granulocytes: 0.03 10*3/uL (ref 0.00–0.07)
Basophils Absolute: 0 10*3/uL (ref 0.0–0.1)
Basophils Relative: 0 %
Eosinophils Absolute: 0 10*3/uL (ref 0.0–0.5)
Eosinophils Relative: 0 %
HCT: 36.3 % (ref 36.0–46.0)
Hemoglobin: 11.2 g/dL — ABNORMAL LOW (ref 12.0–15.0)
Immature Granulocytes: 0 %
Lymphocytes Relative: 16 %
Lymphs Abs: 1.5 10*3/uL (ref 0.7–4.0)
MCH: 25.7 pg — ABNORMAL LOW (ref 26.0–34.0)
MCHC: 30.9 g/dL (ref 30.0–36.0)
MCV: 83.4 fL (ref 80.0–100.0)
Monocytes Absolute: 0.5 10*3/uL (ref 0.1–1.0)
Monocytes Relative: 6 %
Neutro Abs: 7.1 10*3/uL (ref 1.7–7.7)
Neutrophils Relative %: 78 %
Platelets: 493 10*3/uL — ABNORMAL HIGH (ref 150–400)
RBC: 4.35 MIL/uL (ref 3.87–5.11)
RDW: 15.6 % — ABNORMAL HIGH (ref 11.5–15.5)
WBC: 9.2 10*3/uL (ref 4.0–10.5)
nRBC: 0 % (ref 0.0–0.2)

## 2021-06-17 LAB — COMPREHENSIVE METABOLIC PANEL
ALT: 13 U/L (ref 0–44)
AST: 25 U/L (ref 15–41)
Albumin: 4.4 g/dL (ref 3.5–5.0)
Alkaline Phosphatase: 69 U/L (ref 38–126)
Anion gap: 9 (ref 5–15)
BUN: 13 mg/dL (ref 6–20)
CO2: 23 mmol/L (ref 22–32)
Calcium: 9.6 mg/dL (ref 8.9–10.3)
Chloride: 104 mmol/L (ref 98–111)
Creatinine, Ser: 0.78 mg/dL (ref 0.44–1.00)
GFR, Estimated: >60 mL/min (ref >60)
Glucose, Bld: 101 mg/dL — ABNORMAL HIGH (ref 70–99)
Potassium: 3.8 mmol/L (ref 3.5–5.1)
Sodium: 136 mmol/L (ref 135–145)
Total Bilirubin: 0.7 mg/dL (ref 0.3–1.2)
Total Protein: 8.4 g/dL — ABNORMAL HIGH (ref 6.5–8.1)

## 2021-06-17 LAB — I-STAT BETA HCG BLOOD, ED (MC, WL, AP ONLY): I-stat hCG, quantitative: <5 m[IU]/mL (ref <5)

## 2021-06-17 LAB — LACTIC ACID, PLASMA: Lactic Acid, Venous: 1.7 mmol/L (ref 0.5–1.9)

## 2021-06-17 LAB — LIPASE, BLOOD: Lipase: 18 U/L (ref 11–51)

## 2021-06-17 MED ORDER — GABAPENTIN 100 MG PO CAPS
200.0000 mg | ORAL_CAPSULE | Freq: Every day | ORAL | Status: DC
  Administered 2021-06-17 – 2021-06-19 (×3): 200 mg via ORAL
  Filled 2021-06-17 (×3): qty 2

## 2021-06-17 MED ORDER — DIPHENHYDRAMINE HCL 50 MG/ML IJ SOLN
25.0000 mg | Freq: Four times a day (QID) | INTRAMUSCULAR | Status: DC | PRN
  Administered 2021-06-17 – 2021-06-19 (×8): 25 mg via INTRAVENOUS
  Filled 2021-06-17 (×8): qty 1

## 2021-06-17 MED ORDER — OXYCODONE HCL ER 10 MG PO T12A
10.0000 mg | EXTENDED_RELEASE_TABLET | Freq: Every day | ORAL | Status: DC
  Administered 2021-06-18 – 2021-06-19 (×2): 10 mg via ORAL
  Filled 2021-06-17 (×2): qty 1

## 2021-06-17 MED ORDER — ONDANSETRON HCL 4 MG/2ML IJ SOLN: 4.0000 mg | Freq: Four times a day (QID) | INTRAMUSCULAR | Status: DC | PRN

## 2021-06-17 MED ORDER — HYDROMORPHONE HCL 1 MG/ML IJ SOLN
1.0000 mg | Freq: Once | INTRAMUSCULAR | Status: AC
  Administered 2021-06-17: 1 mg via INTRAVENOUS
  Filled 2021-06-17: qty 1

## 2021-06-17 MED ORDER — KETOROLAC TROMETHAMINE 15 MG/ML IJ SOLN: 15.0000 mg | Freq: Four times a day (QID) | INTRAMUSCULAR | Status: DC | PRN

## 2021-06-17 MED ORDER — LEVOTHYROXINE SODIUM 88 MCG PO TABS
88.0000 ug | ORAL_TABLET | Freq: Every day | ORAL | Status: DC
  Administered 2021-06-17 – 2021-06-19 (×3): 88 ug via ORAL
  Filled 2021-06-17 (×3): qty 1

## 2021-06-17 MED ORDER — DIPHENHYDRAMINE HCL 50 MG/ML IJ SOLN
50.0000 mg | Freq: Once | INTRAMUSCULAR | Status: AC
  Administered 2021-06-17: 50 mg via INTRAVENOUS
  Filled 2021-06-17: qty 1

## 2021-06-17 MED ORDER — LACTATED RINGERS IV SOLN
INTRAVENOUS | Status: DC
  Administered 2021-06-18: 100 mL via INTRAVENOUS

## 2021-06-17 MED ORDER — HALOPERIDOL LACTATE 5 MG/ML IJ SOLN: 2.0000 mg | Freq: Once | INTRAMUSCULAR | Status: DC

## 2021-06-17 MED ORDER — OXYCODONE ER 9 MG PO C12A: 9.0000 mg | EXTENDED_RELEASE_CAPSULE | Freq: Every day | ORAL | Status: DC

## 2021-06-17 MED ORDER — SODIUM CHLORIDE 0.9 % IV SOLN
1.0000 g | INTRAVENOUS | Status: DC
  Administered 2021-06-17 – 2021-06-19 (×3): 1 g via INTRAVENOUS
  Filled 2021-06-17 (×3): qty 10

## 2021-06-17 MED ORDER — KETOROLAC TROMETHAMINE 15 MG/ML IJ SOLN
15.0000 mg | Freq: Once | INTRAMUSCULAR | Status: AC
  Administered 2021-06-17: 15 mg via INTRAVENOUS
  Filled 2021-06-17: qty 1

## 2021-06-17 MED ORDER — DIPHENHYDRAMINE HCL 50 MG/ML IJ SOLN
50.0000 mg | Freq: Four times a day (QID) | INTRAMUSCULAR | Status: DC | PRN
  Administered 2021-06-17 (×2): 50 mg via INTRAVENOUS
  Filled 2021-06-17 (×2): qty 1

## 2021-06-17 MED ORDER — ZOLPIDEM TARTRATE 10 MG PO TABS
10.0000 mg | ORAL_TABLET | Freq: Every day | ORAL | Status: DC
  Administered 2021-06-17 – 2021-06-18 (×2): 10 mg via ORAL
  Filled 2021-06-17 (×2): qty 1

## 2021-06-17 MED ORDER — ACETAMINOPHEN 325 MG PO TABS: 650.0000 mg | ORAL_TABLET | Freq: Four times a day (QID) | ORAL | Status: DC | PRN

## 2021-06-17 MED ORDER — FLUOXETINE HCL 20 MG PO CAPS
60.0000 mg | ORAL_CAPSULE | Freq: Every morning | ORAL | Status: DC
  Administered 2021-06-17 – 2021-06-19 (×3): 60 mg via ORAL
  Filled 2021-06-17 (×4): qty 3

## 2021-06-17 MED ORDER — ACETAMINOPHEN 650 MG RE SUPP
650.0000 mg | Freq: Four times a day (QID) | RECTAL | Status: DC | PRN
Start: 2021-06-17 — End: 2021-06-19

## 2021-06-17 MED ORDER — HYDROMORPHONE HCL 1 MG/ML IJ SOLN
1.0000 mg | INTRAMUSCULAR | Status: AC | PRN
  Administered 2021-06-17 (×3): 1 mg via INTRAVENOUS
  Filled 2021-06-17 (×3): qty 1

## 2021-06-17 MED ORDER — SODIUM CHLORIDE 0.9 % IV SOLN: 1.0000 g | INTRAVENOUS | Status: DC

## 2021-06-17 MED ORDER — ALPRAZOLAM 1 MG PO TABS
1.0000 mg | ORAL_TABLET | Freq: Two times a day (BID) | ORAL | Status: DC
  Administered 2021-06-17 – 2021-06-19 (×5): 1 mg via ORAL
  Filled 2021-06-17 (×5): qty 2

## 2021-06-17 MED ORDER — OXYCODONE HCL 5 MG PO TABS
10.0000 mg | ORAL_TABLET | ORAL | Status: DC | PRN
  Administered 2021-06-17 – 2021-06-19 (×9): 10 mg via ORAL
  Filled 2021-06-17 (×9): qty 2

## 2021-06-17 NOTE — H&P (Signed)
History and Physical    Kimberly Valencia [facility]:[id number] DOB: 02/01/1986 DOA: 06/16/2021  PCP: Daryll Drown, NP   Patient coming from: Home  Chief Complaint: Abdominal pain, nausea,vomiting and diarrhea  HPI: Kimberly Valencia is a 35 y.o. female with medical history significant for Crohn's disease, Asherman syndrome, Hashimoto's disease who presents for evaluation of abdominal pain and right flank pain.  She reports that symptoms began on 1 to 2 days ago.  She reports right flank pain will radiate into the right lower quadrant of her abdomen.  She has not had any alleviating factors at home.  Pain is intensified if she bends over or twists her body.  She reports she developed some dysuria 2 days ago.  She does have a history of recurrent urinary tract infections and pyelonephritis she states.  She reports she has been having diarrhea for the last few weeks which she has attributed to her Crohn's disease.  In the last few days she has had nausea and vomiting with poor p.o. intake.  She denies any injury or trauma to her abdomen or back. She has history of smoking but states she quit in 2020.  She states that she quit drinking alcohol a month ago.  She was only drinking socially.  She denies illicit drug use  ED Course: Kimberly Valencia has been hemodynamically stable in the emergency room.  He is required multiple doses of pain medication.  CBC is unremarkable.  CMP is unremarkable.  Lactic acid level is 1.7, urinalysis shows hazy yellow urine with moderate leukocyte esterase and positive nitrites with greater than 50 WBCs.  Hospitalist service was asked to admit for further management  Review of Systems:  General: Denies  fever, chills, weight loss, night sweats.  Denies dizziness.  Denies change in appetite HENT: Denies head trauma, headache, denies change in hearing, tinnitus.  Denies nasal congestion.  Denies sore throat, Denies difficulty swallowing Eyes: Denies blurry vision, pain in eye, drainage.   Denies discoloration of eyes. Neck: Denies pain.  Denies swelling.  Denies pain with movement. Cardiovascular: Denies chest pain, palpitations.  Denies edema.  Denies orthopnea Respiratory: Denies shortness of breath, cough.  Denies wheezing.  Denies sputum production Gastrointestinal: Reports abdominal pain, nausea, vomiting, diarrhea.  Denies melena.  Denies hematemesis. Musculoskeletal: Denies limitation of movement.  Denies deformity or swelling.  Denies pain.  Denies arthralgias or myalgias. Genitourinary: Denies pelvic pain.  Denies urinary frequency or hesitancy.  Denies dysuria.  Skin: Denies rash.  Denies petechiae, purpura, ecchymosis. Neurological: Denies syncope. Denies seizure activity. Denies paresthesia. Denies slurred speech, drooping face. Denies visual change. Psychiatric: Denies depression, anxiety. Denies hallucinations.  Past Medical History:  Diagnosis Date   Anxiety    Asherman syndrome    Clostridium difficile infection    Crohn disease (HCC)    Depression    Hashimoto's disease     Past Surgical History:  Procedure Laterality Date   BREAST ENHANCEMENT SURGERY     DILATION AND CURETTAGE OF UTERUS     TONSILLECTOMY      Social History  reports that she quit smoking about 2 years ago. She has a 1.00 pack-year smoking history. She has never used smokeless tobacco. She reports current alcohol use. She reports that she does not use drugs.  Allergies  Allergen Reactions   Trazodone     Other reaction(s): Other   Bentyl [Dicyclomine Hcl]    Compazine [Prochlorperazine]    Reglan [Metoclopramide]    Sulfa Antibiotics    Tramadol  Rash    Family History  Problem Relation Age of Onset   Hyperlipidemia Mother    Hypertension Father      Prior to Admission medications   Medication Sig Start Date End Date Taking? Authorizing Provider  ALPRAZolam Prudy Feeler) 1 MG tablet Take 1 tablet (1 mg total) by mouth 2 (two) times daily. 05/06/21  Yes Arrien, York Ram, MD  FLUoxetine (PROZAC) 20 MG capsule Take 3 capsules (60 mg total) by mouth every morning. 05/13/21  Yes Daryll Drown, NP  gabapentin (NEURONTIN) 100 MG capsule Take 200 mg by mouth daily. 03/15/21  Yes [provider]  levothyroxine (SYNTHROID) 88 MCG tablet Take 1 tablet (88 mcg total) by mouth daily before breakfast. 05/13/21  Yes Daryll Drown, NP  Oxycodone HCl 10 MG TABS Take 10 mg by mouth every 4 (four) hours as needed for pain. 04/01/21  Yes [provider]  XTAMPZA ER 9 MG C12A Take 9 mg by mouth daily. 05/14/21  Yes [provider]  zolpidem (AMBIEN) 10 MG tablet Take 10 mg by mouth at bedtime. 03/20/21  Yes [provider]  potassium chloride SA (KLOR-CON) 20 MEQ tablet Take 1 tablet (20 mEq total) by mouth daily for 7 days. 06/12/21 06/19/21  Burnadette Pop, MD    Physical Exam: Vitals:   06/17/21 0330 06/17/21 0400 06/17/21 0500 06/17/21 0530  BP: 102/67 105/67 114/70 106/71  Pulse: 82 72 68 76  Resp: 15 11 (!) 23 15  Temp:      TempSrc:      SpO2: 98% 100% 100% 100%  Weight:      Height:        Constitutional: NAD, calm, comfortable Vitals:   06/17/21 0330 06/17/21 0400 06/17/21 0500 06/17/21 0530  BP: 102/67 105/67 114/70 106/71  Pulse: 82 72 68 76  Resp: 15 11 (!) 23 15  Temp:      TempSrc:      SpO2: 98% 100% 100% 100%  Weight:      Height:       General: WDWN, Alert and oriented x3.  Eyes: EOMI, PERRL, conjunctivae normal.  Sclera nonicteric HENT:  [place]/AT, external ears normal.  Nares patent without epistasis.  Mucous membranes are dry. Normal dentition.  Neck: Soft, normal range of motion, supple, no masses, no thyromegaly.  Trachea midline Respiratory: clear to auscultation bilaterally, no wheezing, no crackles. Normal respiratory effort. No accessory muscle use.  Cardiovascular: Regular rate and rhythm, no murmurs / rubs / gallops. No extremity edema. 2+ pedal pulses. Abdomen: Soft, Diffuse tenderness,  nondistended, no rebound or guarding.  No masses palpated. No hepatosplenomegaly. Bowel sounds hypoactive. Right flank pain to palpation.  Musculoskeletal: FROM. no cyanosis. No joint deformity upper and lower extremities. Normal muscle tone.  Skin: Warm, dry, intact no rashes, lesions, ulcers. No induration Neurologic: CN 2-12 grossly intact.  Normal speech.  Sensation intact,  Strength 5/5 in all extremities.   Psychiatric: Normal judgment and insight.  Normal mood.    Labs on Admission: I have personally reviewed following labs and imaging studies  CBC: Recent Labs  Lab 06/11/21 0508 06/12/21 0524 06/17/21 0130  WBC 6.1 5.3 9.2  NEUTROABS  --  2.0 7.1  HGB 9.9* 10.5* 11.2*  HCT 33.9* 35.2* 36.3  MCV 88.7 87.1 83.4  PLT 334 390 493*    Basic Metabolic Panel: Recent Labs  Lab 06/11/21 0508 06/12/21 0524 06/17/21 0130  NA 133* 135 136  K 4.1 3.6 3.8  CL  103 103 104  CO2 22 25 23   GLUCOSE 59* 130* 101*  BUN 9 7 13   CREATININE 0.66 0.84 0.78  CALCIUM 8.8* 9.4 9.6    GFR: Estimated Creatinine Clearance: 71.7 mL/min (by C-G formula based on SCr of 0.78 mg/dL).  Liver Function Tests: Recent Labs  Lab 06/11/21 0508 06/17/21 0130  AST 18 25  ALT 10 13  ALKPHOS 48 69  BILITOT 0.7 0.7  PROT 6.6 8.4*  ALBUMIN 3.4* 4.4    Urine analysis:    Component Value Date/Time   COLORURINE YELLOW 06/16/2021 2242   APPEARANCEUR HAZY (A) 06/16/2021 2242   LABSPEC 1.020 06/16/2021 2242   PHURINE 7.0 06/16/2021 2242   GLUCOSEU NEGATIVE 06/16/2021 2242   HGBUR NEGATIVE 06/16/2021 2242   BILIRUBINUR NEGATIVE 06/16/2021 2242   KETONESUR 20 (A) 06/16/2021 2242   PROTEINUR 30 (A) 06/16/2021 2242   NITRITE POSITIVE (A) 06/16/2021 2242   LEUKOCYTESUR MODERATE (A) 06/16/2021 2242    Radiological Exams on Admission: No results found.   Assessment/Plan Principal Problem:   Pyelonephritis Kimberly Valencia is admitted to MedSurg floor. She is placed on Rocephin for antibiotic  coverage Urine culture obtained in the emergency room and will be monitored  Active Problems:   Nausea vomiting and diarrhea Patient request Benadryl for antiemetic.  She states this works better than Zofran or Phenergan for her. She has chronic diarrhea with her Crohn's disease    Crohn disease  Chronic.    Chronic abdominal pain Continue pain control with Dilaudid and her home medication of gabapentin.    DVT prophylaxis: Early ambulation. Padua score low  Code Status:   Full Code  Family Communication:  Diagnosis and plan discussed with patient.  Patient verbalized understanding and agrees with plan Disposition Plan:   Patient is from:  Home  Anticipated DC to:  Home   Anticipated DC date:  Anticipate 2 midnight or more stay  Anticipated DC barriers: Years to discharge identified at this time   Admission status:  Inpatient   [id number] MD [facility]  How to contact the [facility] Attending or Consulting provider 7A - 7P or covering provider during after hours 7P -7A, for this patient?   Check the care team in [facility] and look for a) attending/consulting TR[facility] provider listed and b) the [facility] team listed Log into [email or web address] and use [place]'s universal password to access. If you do not have the password, please contact the hospital operator. Locate the [facility] provider you are looking for under [facility] and page to a number that you can be directly reached. If you still have difficulty reaching the provider, please page the [facility] (Director on Call) for the Hospitalists listed on amion for assistance.  06/17/2021, 6:01 AM

## 2021-06-17 NOTE — Telephone Encounter (Signed)
Transition Care Management Unsuccessful Follow-up Telephone Call  Date of discharge and from where:  06/12/21 [facility]  Diagnosis: diarrhea/ colitis   Attempts:  2nd Attempt  Reason for unsuccessful TCM follow-up call:  Voice mail full

## 2021-06-17 NOTE — ED Notes (Signed)
IV Team at bedside 

## 2021-06-17 NOTE — Progress Notes (Signed)
Patient ID: Kimberly Valencia, female   DOB: 1986/01/18, 35 y.o.   MRN: [id number] Patient admitted early this morning for abdominal pain, nausea, vomiting and diarrhea with presumed acute pyonephritis and has been started on IV antibiotics.  Patient seen and examined at bedside.  I have reviewed patient's medical records including this morning's H&P, current vitals, labs, medications myself.  Follow cultures.  Repeat a.m. labs.  Continue antibiotics.

## 2021-06-18 DIAGNOSIS — R109 Unspecified abdominal pain: Secondary | ICD-10-CM

## 2021-06-18 DIAGNOSIS — R197 Diarrhea, unspecified: Secondary | ICD-10-CM

## 2021-06-18 DIAGNOSIS — N390 Urinary tract infection, site not specified: Secondary | ICD-10-CM

## 2021-06-18 DIAGNOSIS — R112 Nausea with vomiting, unspecified: Secondary | ICD-10-CM

## 2021-06-18 DIAGNOSIS — G8929 Other chronic pain: Secondary | ICD-10-CM

## 2021-06-18 LAB — BASIC METABOLIC PANEL
Anion gap: 5 (ref 5–15)
BUN: 7 mg/dL (ref 6–20)
CO2: 26 mmol/L (ref 22–32)
Calcium: 9 mg/dL (ref 8.9–10.3)
Chloride: 103 mmol/L (ref 98–111)
Creatinine, Ser: 0.6 mg/dL (ref 0.44–1.00)
GFR, Estimated: >60 mL/min (ref >60)
Glucose, Bld: 83 mg/dL (ref 70–99)
Potassium: 3.9 mmol/L (ref 3.5–5.1)
Sodium: 134 mmol/L — ABNORMAL LOW (ref 135–145)

## 2021-06-18 LAB — CBC
HCT: 32 % — ABNORMAL LOW (ref 36.0–46.0)
Hemoglobin: 9.8 g/dL — ABNORMAL LOW (ref 12.0–15.0)
MCH: 25.9 pg — ABNORMAL LOW (ref 26.0–34.0)
MCHC: 30.6 g/dL (ref 30.0–36.0)
MCV: 84.4 fL (ref 80.0–100.0)
Platelets: 379 10*3/uL (ref 150–400)
RBC: 3.79 MIL/uL — ABNORMAL LOW (ref 3.87–5.11)
RDW: 15.7 % — ABNORMAL HIGH (ref 11.5–15.5)
WBC: 4.8 10*3/uL (ref 4.0–10.5)
nRBC: 0 % (ref 0.0–0.2)

## 2021-06-18 LAB — C-REACTIVE PROTEIN: CRP: <0.5 mg/dL (ref <1.0)

## 2021-06-18 LAB — MAGNESIUM: Magnesium: 2 mg/dL (ref 1.7–2.4)

## 2021-06-18 MED ORDER — ADULT MULTIVITAMIN W/MINERALS CH
1.0000 | ORAL_TABLET | Freq: Every day | ORAL | Status: DC
  Filled 2021-06-18: qty 1

## 2021-06-18 MED ORDER — ENSURE ENLIVE PO LIQD: 237.0000 mL | Freq: Two times a day (BID) | ORAL | Status: DC

## 2021-06-18 NOTE — Progress Notes (Signed)
Initial Nutrition Assessment  DOCUMENTATION CODES:   Severe malnutrition in context of chronic illness  INTERVENTION:  -Ensure Enlive po BID, each supplement provides 350 kcal and 20 grams of protein -Magic cup TID with meals, each supplement provides 290 kcal and 9 grams of protein -MVI with minerals daily  NUTRITION DIAGNOSIS:   Severe Malnutrition related to chronic illness as evidenced by severe fat depletion, severe muscle depletion.  GOAL:   Patient will meet greater than or equal to 90% of their needs  MONITOR:   PO intake, Supplement acceptance, Diet advancement, Weight trends, Labs, I & O's  REASON FOR ASSESSMENT:   Malnutrition Screening Tool    ASSESSMENT:   Pt with PMH significant for Crohn's disease, Asherman syndrome, Hashimoto's disease, chronic pain syndrome, anxiety, hypothyroidism, chronic abdominal pain/diarrhea, multiple admissions to various hospitals and emergency departments in the last few months for abdominal pain and diarrhea with recent hospitalization from 06/09/2021-06/12/2021 for the same and admission at [facility] in [place] with discharge on 06/03/2021 during which she underwent endoscopy with biopsies without findings of IBD presented with abdominal pain and N/V/D. Admitted with UTI/pyelonephritis.  Pt reports abdominal pain began 1-2 days PTA and states that she also had N/V w/ poor po intake for a few days before this admission. Pt also reports 3 week h/o diarrhea which she feels is 2/2 Crohn's disease. Pt endorses abdominal pain and nausea today, though wanted to attempt a soft diet. Will order oral nutrition supplements to provide pt with additional kcals/protein.    PO intake: 100% x 1 recorded meal  UOP: 2.7L x24 hours  Medications reviewed. Labs: Na 134 (L)  NUTRITION - FOCUSED PHYSICAL EXAM:  Flowsheet Row Most Recent Value  Orbital Region Mild depletion  Upper Arm Region Severe depletion  Thoracic and Lumbar Region  Moderate depletion  Buccal Region Severe depletion  Temple Region Severe depletion  Clavicle Bone Region Severe depletion  Clavicle and Acromion Bone Region Severe depletion  Scapular Bone Region Severe depletion  Dorsal Hand Moderate depletion  Patellar Region Severe depletion  Anterior Thigh Region Severe depletion  Posterior Calf Region Severe depletion  Edema (RD Assessment) None  Hair Reviewed  Eyes Reviewed  Mouth Reviewed  Skin Reviewed  Nails Reviewed       Diet Order:   Diet Order             DIET SOFT Room service appropriate? Yes; Fluid consistency: Thin  Diet effective now                   EDUCATION NEEDS:   No education needs have been identified at this time  Skin:  Skin Assessment: Reviewed RN Assessment  Last BM:  6/19  Height:   Ht Readings from Last 1 Encounters:  06/16/21 5\' 1"  (1.549 m)    Weight:   Wt Readings from Last 10 Encounters:  06/16/21 45.4 kg  06/09/21 45.4 kg  05/17/21 46.3 kg  05/13/21 46.3 kg  05/09/21 42.2 kg  04/17/21 42.2 kg  04/08/21 41.7 kg   BMI:  Body mass index is 18.89 kg/m.  Estimated Nutritional Needs:   Kcal:  1400-1600  Protein:  70-80g  Fluid:  >1.4L/d    06/08/21, MS, RD, LDN (she/her/hers) RD pager number and weekend/on-call pager number located in Amion.

## 2021-06-18 NOTE — Plan of Care (Signed)
Pt has a long history of different hospitals visits; she requested dilaudid but practitioner on duty recommended to not administered 2nd to amount of opiates, benzo and sleep aid patient is presently under. Pt did not have Dilaudid Rx in the past visits but has received on this admission; she was calm and able to rest through the night; no deep sleep; no s/s of acute distress reported or observed; call light and cell phone within reach; bed at lowest position for safety.

## 2021-06-18 NOTE — Progress Notes (Signed)
Patient ID: Kimberly Valencia, female   DOB: [birth year], 35 y.o.   MRN: [id number]  PROGRESS NOTE    Alinda Egolf  SAY:[id number] DOB: 04-Jan-1986 DOA: 06/16/2021 PCP: Daryll Drown, NP   Brief Narrative:  35 y.o. female with medical history significant for Crohn's disease, Asherman syndrome, Hashimoto's disease, chronic pain syndrome, anxiety, hypothyroidism, chronic abdominal pain/diarrhea, multiple admissions to various hospitals and emergency departments in the last few months for abdominal pain and diarrhea with recent hospitalization from 06/09/2021-06/12/2021 for the same and admission at [facility] in [place] with discharge on 06/03/2021 during which she underwent endoscopy with biopsies without findings of inflammatory bowel disease presented with abdominal pain, nausea, vomiting and diarrhea.  On presentation, UA was suggestive of UTI.  She required multiple doses of pain medication.  She was started on IV fluids and antibiotics.  Assessment & Plan:   Possible UTI: Present on admission -Presented with abdominal pain, nausea and vomiting with UA suggestive of UTI. -Currently afebrile with no leukocytosis but patient still complains of abdominal pain. -Currently on Rocephin.  Cultures negative so far.  Continue Rocephin.  Chronic abdominal pain/diarrhea ?  Crohn's disease Chronic pain syndrome -Patient has had multiple admissions/ER visits for the same with recent hospitalization from 06/09/2021-06/12/2021 for the same and admission at [facility] in [place] with discharge on 06/03/2021 during which she underwent endoscopy with biopsies without findings of inflammatory bowel disease.  She had C. difficile testing done during recent hospitalization which was suggestive of colonization as antigen was positive but toxin was negative and hence no treatment was started. -She needs follow-up with GI as an outpatient. -Still complains of abdominal pain although patient is already on  short acting oxycodone every 4 hours as needed.  She is also on Xtampza ER at home which is being substituted with OxyContin 10 mg twice a day scheduled for now.  She received few doses of Dilaudid IV on presentation.  We will hold off on more IV Dilaudid.  She is already on IV Benadryl as per her request and was requesting for Phenergan as well yesterday.  Would not use IV Benadryl and Phenergan at the same time.  Continue Benadryl as needed along with Zofran as needed.  Still complains of some nausea but wants to advance diet to soft diet. -Advance diet to soft diet today.  Decrease IV fluids to 75 cc an hour. -Continue gabapentin -Outpatient follow-up with pain management  Anxiety/depression  -continue alprazolam and fluoxetine.  Outpatient follow-up with psychiatry  Hypothyroidism -Continue low thyroxine  Chronic normocytic anemia -Questionable cause.  Hemoglobin stable.  Mild hyponatremia -Encourage oral intake   DVT prophylaxis: Early ambulation Code Status: Full Family Communication: None at bedside Disposition Plan: Status is: Inpatient  Remains inpatient appropriate because:Inpatient level of care appropriate due to severity of illness  Dispo: The patient is from: Home              Anticipated d/c is to: Home              Patient currently is not medically stable to d/c.   Difficult to place patient No  Consultants: None  Procedures: None  Antimicrobials: Rocephin   Subjective: Patient seen and examined at bedside.  Complains of abdominal pain and nausea.  Requesting additional pain medications.  Feels that she is ready to advance diet to soft diet.  No overnight fever reported.  Objective: Vitals:   06/17/21 1158 06/17/21 1613 06/17/21 2033 06/18/21 0633  BP: 112/80  115/77 119/75 97/68  Pulse: 68 77 78 69  Resp: 16 16 17 17   Temp: 98.2 F (36.8 C) 98 F (36.7 C) 98.3 F (36.8 C) 98.1 F (36.7 C)  TempSrc: Oral  Oral Oral  SpO2: 100% 100% 100% 100%   Weight:      Height:        Intake/Output Summary (Last 24 hours) at 06/18/2021 1052 Last data filed at 06/18/2021 0700 Gross per 24 hour  Intake 939.67 ml  Output 2700 ml  Net -1760.33 ml   Filed Weights   06/16/21 2225  Weight: 45.4 kg    Examination:  General exam: Appears calm and comfortable  Respiratory system: Bilateral decreased breath sounds at bases Cardiovascular system: S1 & S2 heard, Rate controlled Gastrointestinal system: Abdomen is nondistended, soft and mildly tender in the lower quadrant.  Normal bowel sounds heard. Extremities: No cyanosis, clubbing, edema  Central nervous system: Alert and oriented. No focal neurological deficits. Moving extremities Skin: No rashes, lesions or ulcers Psychiatry: Flat affect   Data Reviewed: I have personally reviewed following labs and imaging studies  CBC: Recent Labs  Lab 06/12/21 0524 06/17/21 0130 06/18/21 0627  WBC 5.3 9.2 4.8  NEUTROABS 2.0 7.1  --   HGB 10.5* 11.2* 9.8*  HCT 35.2* 36.3 32.0*  MCV 87.1 83.4 84.4  PLT 390 493* 379   Basic Metabolic Panel: Recent Labs  Lab 06/12/21 0524 06/17/21 0130 06/18/21 0627  NA 135 136 134*  K 3.6 3.8 3.9  CL 103 104 103  CO2 25 23 26   GLUCOSE 130* 101* 83  BUN 7 13 7   CREATININE 0.84 0.78 0.60  CALCIUM 9.4 9.6 9.0  MG  --   --  2.0   GFR: Estimated Creatinine Clearance: 71.7 mL/min (by C-G formula based on SCr of 0.6 mg/dL). Liver Function Tests: Recent Labs  Lab 06/17/21 0130  AST 25  ALT 13  ALKPHOS 69  BILITOT 0.7  PROT 8.4*  ALBUMIN 4.4   Recent Labs  Lab 06/17/21 0130  LIPASE 18   No results for input(s): AMMONIA in the last 168 hours. Coagulation Profile: No results for input(s): INR, PROTIME in the last 168 hours. Cardiac Enzymes: No results for input(s): CKTOTAL, CKMB, CKMBINDEX, TROPONINI in the last 168 hours. BNP (last 3 results) No results for input(s): PROBNP in the last 8760 hours. HbA1C: No results for input(s): HGBA1C  in the last 72 hours. CBG: No results for input(s): GLUCAP in the last 168 hours. Lipid Profile: No results for input(s): CHOL, HDL, LDLCALC, TRIG, CHOLHDL, LDLDIRECT in the last 72 hours. Thyroid Function Tests: No results for input(s): TSH, T4TOTAL, FREET4, T3FREE, THYROIDAB in the last 72 hours. Anemia Panel: No results for input(s): VITAMINB12, FOLATE, FERRITIN, TIBC, IRON, RETICCTPCT in the last 72 hours. Sepsis Labs: Recent Labs  Lab 06/17/21 0041  LATICACIDVEN 1.7    Recent Results (from the past 240 hour(s))  Resp Panel by RT-PCR (Flu A&B, Covid) Nasopharyngeal Swab     Status: None   Collection Time: 06/10/21  6:46 AM   Specimen: Nasopharyngeal Swab; Nasopharyngeal(NP) swabs in vial transport medium  Result Value Ref Range Status   SARS Coronavirus 2 by RT PCR NEGATIVE NEGATIVE Final    Comment: (NOTE) SARS-CoV-2 target nucleic acids are NOT DETECTED.  The SARS-CoV-2 RNA is generally detectable in upper respiratory specimens during the acute phase of infection. The lowest concentration of SARS-CoV-2 viral copies this assay can detect is 138 copies/mL. A negative result does  not preclude SARS-Cov-2 infection and should not be used as the sole basis for treatment or other patient management decisions. A negative result may occur with  improper specimen collection/handling, submission of specimen other than nasopharyngeal swab, presence of viral mutation(s) within the areas targeted by this assay, and inadequate number of viral copies(<138 copies/mL). A negative result must be combined with clinical observations, patient history, and epidemiological information. The expected result is Negative.  Fact Sheet for Patients:  BloggerCourse.com  Fact Sheet for Healthcare Providers:  SeriousBroker.it  This test is no t yet approved or cleared by the [place] FDA and  has been authorized for detection and/or diagnosis of  SARS-CoV-2 by FDA under an Emergency Use Authorization (EUA). This EUA will remain  in effect (meaning this test can be used) for the duration of the COVID-19 declaration under Section 564(b)(1) of the Act, 21 U.S.C.section 360bbb-3(b)(1), unless the authorization is terminated  or revoked sooner.       Influenza A by PCR NEGATIVE NEGATIVE Final   Influenza B by PCR NEGATIVE NEGATIVE Final    Comment: (NOTE) The Xpert Xpress SARS-CoV-2/FLU/RSV plus assay is intended as an aid in the diagnosis of influenza from Nasopharyngeal swab specimens and should not be used as a sole basis for treatment. Nasal washings and aspirates are unacceptable for Xpert Xpress SARS-CoV-2/FLU/RSV testing.  Fact Sheet for Patients: BloggerCourse.com  Fact Sheet for Healthcare Providers: SeriousBroker.it  This test is not yet approved or cleared by the [place] FDA and has been authorized for detection and/or diagnosis of SARS-CoV-2 by FDA under an Emergency Use Authorization (EUA). This EUA will remain in effect (meaning this test can be used) for the duration of the COVID-19 declaration under Section 564(b)(1) of the Act, 21 U.S.C. section 360bbb-3(b)(1), unless the authorization is terminated or revoked.  Performed at [facility], [address] [place], [place] [address]   C Difficile Quick Screen w PCR reflex     Status: Abnormal   Collection Time: 06/10/21  5:10 PM   Specimen: Stool  Result Value Ref Range Status   C Diff antigen POSITIVE (A) NEGATIVE Final   C Diff toxin NEGATIVE NEGATIVE Final   C Diff interpretation Results are indeterminate. See PCR results.  Final    Comment: Performed at [facility], [address] [place], [place] [address]  Gastrointestinal Panel by PCR , Stool     Status: None   Collection Time: 06/10/21  5:15 PM   Specimen: Stool  Result Value Ref Range Status    Campylobacter species NOT DETECTED NOT DETECTED Final   Plesimonas shigelloides NOT DETECTED NOT DETECTED Final   Salmonella species NOT DETECTED NOT DETECTED Final   Yersinia enterocolitica NOT DETECTED NOT DETECTED Final   Vibrio species NOT DETECTED NOT DETECTED Final   Vibrio cholerae NOT DETECTED NOT DETECTED Final   Enteroaggregative E coli (EAEC) NOT DETECTED NOT DETECTED Final   Enteropathogenic E coli (EPEC) NOT DETECTED NOT DETECTED Final   Enterotoxigenic E coli (ETEC) NOT DETECTED NOT DETECTED Final   Shiga like toxin producing E coli (STEC) NOT DETECTED NOT DETECTED Final   Shigella/Enteroinvasive E coli (EIEC) NOT DETECTED NOT DETECTED Final   Cryptosporidium NOT DETECTED NOT DETECTED Final   Cyclospora cayetanensis NOT DETECTED NOT DETECTED Final   Entamoeba histolytica NOT DETECTED NOT DETECTED Final   Giardia lamblia NOT DETECTED NOT DETECTED Final   Adenovirus F40/41 NOT DETECTED NOT DETECTED Final   Astrovirus NOT DETECTED NOT  DETECTED Final   Norovirus GI/GII NOT DETECTED NOT DETECTED Final   Rotavirus A NOT DETECTED NOT DETECTED Final   Sapovirus (I, II, IV, and V) NOT DETECTED NOT DETECTED Final    Comment: Performed at [facility], [address] [place], [place] [address]  Urine culture     Status: Abnormal (Preliminary result)   Collection Time: 06/16/21 10:43 PM   Specimen: Urine, Random  Result Value Ref Range Status   Specimen Description   Final    URINE, RANDOM Performed at [facility], [address] [place], [place] [address]    Special Requests   Final    NONE Performed at [facility], [address] [place], [place] [address]    Culture >=100,000 COLONIES/mL GRAM NEGATIVE RODS (A)  Final   Report Status PENDING  Incomplete  Resp Panel by RT-PCR (Flu A&B, Covid) Nasopharyngeal Swab     Status: None   Collection Time: 06/17/21  3:53 AM   Specimen: Nasopharyngeal Swab; Nasopharyngeal(NP) swabs in  vial transport medium  Result Value Ref Range Status   SARS Coronavirus 2 by RT PCR NEGATIVE NEGATIVE Final    Comment: (NOTE) SARS-CoV-2 target nucleic acids are NOT DETECTED.  The SARS-CoV-2 RNA is generally detectable in upper respiratory specimens during the acute phase of infection. The lowest concentration of SARS-CoV-2 viral copies this assay can detect is 138 copies/mL. A negative result does not preclude SARS-Cov-2 infection and should not be used as the sole basis for treatment or other patient management decisions. A negative result may occur with  improper specimen collection/handling, submission of specimen other than nasopharyngeal swab, presence of viral mutation(s) within the areas targeted by this assay, and inadequate number of viral copies(<138 copies/mL). A negative result must be combined with clinical observations, patient history, and epidemiological information. The expected result is Negative.  Fact Sheet for Patients:  BloggerCourse.comhttps://www.fda.gov/media/152166/download  Fact Sheet for Healthcare Providers:  SeriousBroker.ithttps://www.fda.gov/media/152162/download  This test is no t yet approved or cleared by the [place] FDA and  has been authorized for detection and/or diagnosis of SARS-CoV-2 by FDA under an Emergency Use Authorization (EUA). This EUA will remain  in effect (meaning this test can be used) for the duration of the COVID-19 declaration under Section 564(b)(1) of the Act, 21 U.S.C.section 360bbb-3(b)(1), unless the authorization is terminated  or revoked sooner.       Influenza A by PCR NEGATIVE NEGATIVE Final   Influenza B by PCR NEGATIVE NEGATIVE Final    Comment: (NOTE) The Xpert Xpress SARS-CoV-2/FLU/RSV plus assay is intended as an aid in the diagnosis of influenza from Nasopharyngeal swab specimens and should not be used as a sole basis for treatment. Nasal washings and aspirates are unacceptable for Xpert Xpress SARS-CoV-2/FLU/RSV testing.  Fact  Sheet for Patients: BloggerCourse.comhttps://www.fda.gov/media/152166/download  Fact Sheet for Healthcare Providers: SeriousBroker.ithttps://www.fda.gov/media/152162/download  This test is not yet approved or cleared by the [place] FDA and has been authorized for detection and/or diagnosis of SARS-CoV-2 by FDA under an Emergency Use Authorization (EUA). This EUA will remain in effect (meaning this test can be used) for the duration of the COVID-19 declaration under Section 564(b)(1) of the Act, 21 U.S.C. section 360bbb-3(b)(1), unless the authorization is terminated or revoked.  Performed at [facility], [address] [place], [place] [address]          Radiology Studies: No results found.      Scheduled Meds:  ALPRAZolam  1 mg Oral  BID   FLUoxetine  60 mg Oral q morning   gabapentin  200 mg Oral Daily   haloperidol lactate  2 mg Intravenous Once   levothyroxine  88 mcg Oral QAC breakfast   oxyCODONE  10 mg Oral Daily   zolpidem  10 mg Oral QHS   Continuous Infusions:  cefTRIAXone (ROCEPHIN)  IV 1 g (06/18/21 0308)   lactated ringers 100 mL (06/18/21 0158)          Glade Lloyd, MD [facility] 06/18/2021, 10:52 AM

## 2021-06-19 LAB — COMPREHENSIVE METABOLIC PANEL
ALT: 16 U/L (ref 0–44)
AST: 33 U/L (ref 15–41)
Albumin: 4.2 g/dL (ref 3.5–5.0)
Alkaline Phosphatase: 60 U/L (ref 38–126)
Anion gap: 8 (ref 5–15)
BUN: 6 mg/dL (ref 6–20)
CO2: 28 mmol/L (ref 22–32)
Calcium: 9.4 mg/dL (ref 8.9–10.3)
Chloride: 98 mmol/L (ref 98–111)
Creatinine, Ser: 0.86 mg/dL (ref 0.44–1.00)
GFR, Estimated: >60 mL/min (ref >60)
Glucose, Bld: 100 mg/dL — ABNORMAL HIGH (ref 70–99)
Potassium: 3.9 mmol/L (ref 3.5–5.1)
Sodium: 134 mmol/L — ABNORMAL LOW (ref 135–145)
Total Bilirubin: 0.1 mg/dL — ABNORMAL LOW (ref 0.3–1.2)
Total Protein: 7.8 g/dL (ref 6.5–8.1)

## 2021-06-19 LAB — CBC WITH DIFFERENTIAL/PLATELET
Abs Immature Granulocytes: 0.01 10*3/uL (ref 0.00–0.07)
Basophils Absolute: 0 10*3/uL (ref 0.0–0.1)
Basophils Relative: 1 %
Eosinophils Absolute: 0.2 10*3/uL (ref 0.0–0.5)
Eosinophils Relative: 4 %
HCT: 36.9 % (ref 36.0–46.0)
Hemoglobin: 11.1 g/dL — ABNORMAL LOW (ref 12.0–15.0)
Immature Granulocytes: 0 %
Lymphocytes Relative: 40 %
Lymphs Abs: 2.2 10*3/uL (ref 0.7–4.0)
MCH: 25.8 pg — ABNORMAL LOW (ref 26.0–34.0)
MCHC: 30.1 g/dL (ref 30.0–36.0)
MCV: 85.6 fL (ref 80.0–100.0)
Monocytes Absolute: 0.6 10*3/uL (ref 0.1–1.0)
Monocytes Relative: 11 %
Neutro Abs: 2.5 10*3/uL (ref 1.7–7.7)
Neutrophils Relative %: 44 %
Platelets: 426 10*3/uL — ABNORMAL HIGH (ref 150–400)
RBC: 4.31 MIL/uL (ref 3.87–5.11)
RDW: 15.6 % — ABNORMAL HIGH (ref 11.5–15.5)
WBC: 5.5 10*3/uL (ref 4.0–10.5)
nRBC: 0 % (ref 0.0–0.2)

## 2021-06-19 LAB — MAGNESIUM: Magnesium: 2 mg/dL (ref 1.7–2.4)

## 2021-06-19 LAB — URINE CULTURE: Culture: >=100000 — AB

## 2021-06-19 MED ORDER — CEPHALEXIN 500 MG PO CAPS: 500.0000 mg | ORAL_CAPSULE | Freq: Four times a day (QID) | ORAL | 0 refills | Status: AC

## 2021-06-19 NOTE — Discharge Summary (Addendum)
Physician Discharge Summary  Kimberly Valencia DGL:[id number] DOB: [birth year] DOA: 06/16/2021  PCP: Daryll Drown, NP  Admit date: 06/16/2021 Discharge date: 06/19/2021  Time spent: 60 minutes  Recommendations for Outpatient Follow-up:  Follow-up PCP in 2 weeks  Discharge Diagnoses:  Principal Problem:   Pyelonephritis Active Problems:   Crohn disease (HCC)   Chronic abdominal pain   Nausea vomiting and diarrhea   Discharge Condition: Stable  Diet recommendation: Heart healthy diet  Filed Weights   06/16/21 2225  Weight: 45.4 kg    History of present illness:  35 year old female with medical history of Crohn's disease, Asherman syndrome, presented for abdominal pain and right flank pain.  She was found to have abnormal UA and started on IV antibiotics.  Hospital Course:   UTI/pyelonephritis Patient started on IV ceftriaxone for abnormal UA -Urine culture grew E. coli, sensitive to ceftriaxone, cefazolin -We will discharge on Keflex 500 mg p.o. 4 times a day for 4 more days to complete 7 days of treatment.  Chronic abdominal pain -?  Chronic disease, patient has chronic pain syndrome.  She is followed by pain specialist as outpatient.  Patient takes OxyContin and Xtampza ER ER at home.  At this time pain is well controlled.  She will continue take these medications and follow-up with a pain specialist as outpatient.  -Patient has had multiple admissions/ER visits for the same with recent hospitalization from 06/09/2021-06/12/2021 for the same and admission at [facility] in [place] with discharge on 06/03/2021 during which she underwent endoscopy with biopsies without findings of inflammatory bowel disease.  She had C. difficile testing done during recent hospitalization which was suggestive of colonization as antigen was positive but toxin was negative and hence no treatment was started. -She needs follow-up with GI as an outpatient  Hypothyroidism -Continue  Synthroid  Chronic normocytic anemia -Hemoglobin is stable  Anxiety/depression -Continue trazodone, fluoxetine -Outpatient follow-up with psychiatry  Procedures:   Consultations:   Discharge Exam: Vitals:   06/18/21 2106 06/19/21 0441  BP: 108/67 98/61  Pulse: 75 74  Resp: 16 16  Temp: 98.7 F (37.1 C) 98 F (36.7 C)  SpO2: 100% 94%    General: Appears in no acute distress Cardiovascular: S1-S2, regular Respiratory: Clear to auscultation bilaterally  Discharge Instructions   Discharge Instructions     Diet - low sodium heart healthy   Complete by: As directed    Increase activity slowly   Complete by: As directed       Allergies as of 06/19/2021       Reactions   Trazodone    Other reaction(s): Other   Bentyl [dicyclomine Hcl]    Compazine [prochlorperazine]    Reglan [metoclopramide]    Sulfa Antibiotics    Tramadol Rash        Medication List     STOP taking these medications    potassium chloride SA 20 MEQ tablet Commonly known as: KLOR-CON       TAKE these medications    ALPRAZolam 1 MG tablet Commonly known as: XANAX Take 1 tablet (1 mg total) by mouth 2 (two) times daily.   cephALEXin 500 MG capsule Commonly known as: KEFLEX Take 1 capsule (500 mg total) by mouth 4 (four) times daily for 4 days.   FLUoxetine 20 MG capsule Commonly known as: PROZAC Take 3 capsules (60 mg total) by mouth every morning.   gabapentin 100 MG capsule Commonly known as: NEURONTIN Take 200 mg by mouth daily.   levothyroxine  88 MCG tablet Commonly known as: SYNTHROID Take 1 tablet (88 mcg total) by mouth daily before breakfast.   Oxycodone HCl 10 MG Tabs Take 10 mg by mouth every 4 (four) hours as needed for pain.   Xtampza ER 9 MG C12a Generic drug: oxyCODONE ER Take 9 mg by mouth daily.   zolpidem 10 MG tablet Commonly known as: AMBIEN Take 10 mg by mouth at bedtime.       Allergies  Allergen Reactions   Trazodone     Other  reaction(s): Other   Bentyl [Dicyclomine Hcl]    Compazine [Prochlorperazine]    Reglan [Metoclopramide]    Sulfa Antibiotics    Tramadol Rash      The results of significant diagnostics from this hospitalization (including imaging, microbiology, ancillary and laboratory) are listed below for reference.    Significant Diagnostic Studies: CT Abdomen Pelvis Wo Contrast  Result Date: 05/28/2021 CLINICAL DATA:  Generalized abdominal pain, history of colitis EXAM: CT ABDOMEN AND PELVIS WITHOUT CONTRAST TECHNIQUE: Multidetector CT imaging of the abdomen and pelvis was performed following the standard protocol without IV contrast. COMPARISON:  05/04/2021 FINDINGS: Lower chest: Bilateral breast implants are noted. The lung bases are clear. Hepatobiliary: No focal liver abnormality is seen. No gallstones, gallbladder wall thickening, or biliary dilatation. Pancreas: Unremarkable. No pancreatic ductal dilatation or surrounding inflammatory changes. Spleen: Normal in size without focal abnormality. Adrenals/Urinary Tract: Adrenal glands are within normal limits. Kidneys demonstrate no renal calculi or obstructive changes. The ureters are unremarkable. Bladder is decompressed. Stomach/Bowel: No obstructive or inflammatory changes of the colon are noted. The appendix is within normal limits. Small bowel shows no focal abnormality. No inflammatory changes are seen. Stomach is within normal limits. Vascular/Lymphatic: No significant vascular findings are present. No enlarged abdominal or pelvic lymph nodes. Reproductive: Uterus and bilateral adnexa are unremarkable. Other: No abdominal wall hernia or abnormality. No abdominopelvic ascites. Musculoskeletal: No bony abnormality is noted. IMPRESSION: Changes in the distal small bowel seen on the prior exam are not well borne out on today's study. No inflammatory changes are noted. No other focal abnormality is noted. Electronically Signed   By: Alcide CleverMark  Lukens M.D.   On:  05/28/2021 18:50    Microbiology: Recent Results (from the past 240 hour(s))  Resp Panel by RT-PCR (Flu A&B, Covid) Nasopharyngeal Swab     Status: None   Collection Time: 06/10/21  6:46 AM   Specimen: Nasopharyngeal Swab; Nasopharyngeal(NP) swabs in vial transport medium  Result Value Ref Range Status   SARS Coronavirus 2 by RT PCR NEGATIVE NEGATIVE Final    Comment: (NOTE) SARS-CoV-2 target nucleic acids are NOT DETECTED.  The SARS-CoV-2 RNA is generally detectable in upper respiratory specimens during the acute phase of infection. The lowest concentration of SARS-CoV-2 viral copies this assay can detect is 138 copies/mL. A negative result does not preclude SARS-Cov-2 infection and should not be used as the sole basis for treatment or other patient management decisions. A negative result may occur with  improper specimen collection/handling, submission of specimen other than nasopharyngeal swab, presence of viral mutation(s) within the areas targeted by this assay, and inadequate number of viral copies(<138 copies/mL). A negative result must be combined with clinical observations, patient history, and epidemiological information. The expected result is Negative.  Fact Sheet for Patients:  BloggerCourse.comhttps://www.fda.gov/media/152166/download  Fact Sheet for Healthcare Providers:  SeriousBroker.ithttps://www.fda.gov/media/152162/download  This test is no t yet approved or cleared by the [place] FDA and  has been authorized for detection and/or  diagnosis of SARS-CoV-2 by FDA under an Emergency Use Authorization (EUA). This EUA will remain  in effect (meaning this test can be used) for the duration of the COVID-19 declaration under Section 564(b)(1) of the Act, 21 U.S.C.section 360bbb-3(b)(1), unless the authorization is terminated  or revoked sooner.       Influenza A by PCR NEGATIVE NEGATIVE Final   Influenza B by PCR NEGATIVE NEGATIVE Final    Comment: (NOTE) The Xpert Xpress  SARS-CoV-2/FLU/RSV plus assay is intended as an aid in the diagnosis of influenza from Nasopharyngeal swab specimens and should not be used as a sole basis for treatment. Nasal washings and aspirates are unacceptable for Xpert Xpress SARS-CoV-2/FLU/RSV testing.  Fact Sheet for Patients: BloggerCourse.com  Fact Sheet for Healthcare Providers: SeriousBroker.it  This test is not yet approved or cleared by the [place] FDA and has been authorized for detection and/or diagnosis of SARS-CoV-2 by FDA under an Emergency Use Authorization (EUA). This EUA will remain in effect (meaning this test can be used) for the duration of the COVID-19 declaration under Section 564(b)(1) of the Act, 21 U.S.C. section 360bbb-3(b)(1), unless the authorization is terminated or revoked.  Performed at [facility], [address], [place], [place] [address]   C Difficile Quick Screen w PCR reflex     Status: Abnormal   Collection Time: 06/10/21  5:10 PM   Specimen: Stool  Result Value Ref Range Status   C Diff antigen POSITIVE (A) NEGATIVE Final   C Diff toxin NEGATIVE NEGATIVE Final   C Diff interpretation Results are indeterminate. See PCR results.  Final    Comment: Performed at [facility], [address], [place], [place] [address]  Gastrointestinal Panel by PCR , Stool     Status: None   Collection Time: 06/10/21  5:15 PM   Specimen: Stool  Result Value Ref Range Status   Campylobacter species NOT DETECTED NOT DETECTED Final   Plesimonas shigelloides NOT DETECTED NOT DETECTED Final   Salmonella species NOT DETECTED NOT DETECTED Final   Yersinia enterocolitica NOT DETECTED NOT DETECTED Final   Vibrio species NOT DETECTED NOT DETECTED Final   Vibrio cholerae NOT DETECTED NOT DETECTED Final   Enteroaggregative E coli (EAEC) NOT DETECTED NOT DETECTED Final   Enteropathogenic E coli (EPEC) NOT DETECTED NOT  DETECTED Final   Enterotoxigenic E coli (ETEC) NOT DETECTED NOT DETECTED Final   Shiga like toxin producing E coli (STEC) NOT DETECTED NOT DETECTED Final   Shigella/Enteroinvasive E coli (EIEC) NOT DETECTED NOT DETECTED Final   Cryptosporidium NOT DETECTED NOT DETECTED Final   Cyclospora cayetanensis NOT DETECTED NOT DETECTED Final   Entamoeba histolytica NOT DETECTED NOT DETECTED Final   Giardia lamblia NOT DETECTED NOT DETECTED Final   Adenovirus F40/41 NOT DETECTED NOT DETECTED Final   Astrovirus NOT DETECTED NOT DETECTED Final   Norovirus GI/GII NOT DETECTED NOT DETECTED Final   Rotavirus A NOT DETECTED NOT DETECTED Final   Sapovirus (I, II, IV, and V) NOT DETECTED NOT DETECTED Final    Comment: Performed at [facility], [address] [place], [place] [address]  Urine culture     Status: Abnormal   Collection Time: 06/16/21 10:43 PM   Specimen: Urine, Random  Result Value Ref Range Status   Specimen Description   Final    URINE, RANDOM Performed at [facility], [address], [place], [place] [address]    Special Requests   Final  NONE Performed at [facility], [address] [place], [place] [address]    Culture >=100,000 COLONIES/mL ESCHERICHIA COLI (A)  Final   Report Status 06/19/2021 FINAL  Final   Organism ID, Bacteria ESCHERICHIA COLI (A)  Final      Susceptibility   Escherichia coli - MIC*    AMPICILLIN <=2 SENSITIVE Sensitive     CEFAZOLIN <=4 SENSITIVE Sensitive     CEFEPIME <=0.12 SENSITIVE Sensitive     CEFTRIAXONE <=0.25 SENSITIVE Sensitive     CIPROFLOXACIN <=0.25 SENSITIVE Sensitive     GENTAMICIN <=1 SENSITIVE Sensitive     IMIPENEM <=0.25 SENSITIVE Sensitive     NITROFURANTOIN <=16 SENSITIVE Sensitive     TRIMETH/SULFA <=20 SENSITIVE Sensitive     AMPICILLIN/SULBACTAM <=2 SENSITIVE Sensitive     PIP/TAZO <=4 SENSITIVE Sensitive     * >=100,000 COLONIES/mL ESCHERICHIA COLI  Resp Panel by RT-PCR  (Flu A&B, Covid) Nasopharyngeal Swab     Status: None   Collection Time: 06/17/21  3:53 AM   Specimen: Nasopharyngeal Swab; Nasopharyngeal(NP) swabs in vial transport medium  Result Value Ref Range Status   SARS Coronavirus 2 by RT PCR NEGATIVE NEGATIVE Final    Comment: (NOTE) SARS-CoV-2 target nucleic acids are NOT DETECTED.  The SARS-CoV-2 RNA is generally detectable in upper respiratory specimens during the acute phase of infection. The lowest concentration of SARS-CoV-2 viral copies this assay can detect is 138 copies/mL. A negative result does not preclude SARS-Cov-2 infection and should not be used as the sole basis for treatment or other patient management decisions. A negative result may occur with  improper specimen collection/handling, submission of specimen other than nasopharyngeal swab, presence of viral mutation(s) within the areas targeted by this assay, and inadequate number of viral copies(<138 copies/mL). A negative result must be combined with clinical observations, patient history, and epidemiological information. The expected result is Negative.  Fact Sheet for Patients:  BloggerCourse.com  Fact Sheet for Healthcare Providers:  SeriousBroker.it  This test is no t yet approved or cleared by the [place] FDA and  has been authorized for detection and/or diagnosis of SARS-CoV-2 by FDA under an Emergency Use Authorization (EUA). This EUA will remain  in effect (meaning this test can be used) for the duration of the COVID-19 declaration under Section 564(b)(1) of the Act, 21 U.S.C.section 360bbb-3(b)(1), unless the authorization is terminated  or revoked sooner.       Influenza A by PCR NEGATIVE NEGATIVE Final   Influenza B by PCR NEGATIVE NEGATIVE Final    Comment: (NOTE) The Xpert Xpress SARS-CoV-2/FLU/RSV plus assay is intended as an aid in the diagnosis of influenza from Nasopharyngeal swab specimens  and should not be used as a sole basis for treatment. Nasal washings and aspirates are unacceptable for Xpert Xpress SARS-CoV-2/FLU/RSV testing.  Fact Sheet for Patients: BloggerCourse.com  Fact Sheet for Healthcare Providers: SeriousBroker.it  This test is not yet approved or cleared by the [place] FDA and has been authorized for detection and/or diagnosis of SARS-CoV-2 by FDA under an Emergency Use Authorization (EUA). This EUA will remain in effect (meaning this test can be used) for the duration of the COVID-19 declaration under Section 564(b)(1) of the Act, 21 U.S.C. section 360bbb-3(b)(1), unless the authorization is terminated or revoked.  Performed at [facility], [address] [place], [place] [address]      Labs: Basic Metabolic Panel: Recent Labs  Lab 06/17/21 0130 06/18/21 0627 06/19/21 0531  NA 136 134*  134*  K 3.8 3.9 3.9  CL 104 103 98  CO2 23 26 28   GLUCOSE 101* 83 100*  BUN 13 7 6   CREATININE 0.78 0.60 0.86  CALCIUM 9.6 9.0 9.4  MG  --  2.0 2.0   Liver Function Tests: Recent Labs  Lab 06/17/21 0130 06/19/21 0531  AST 25 33  ALT 13 16  ALKPHOS 69 60  BILITOT 0.7 0.1*  PROT 8.4* 7.8  ALBUMIN 4.4 4.2   Recent Labs  Lab 06/17/21 0130  LIPASE 18   No results for input(s): AMMONIA in the last 168 hours. CBC: Recent Labs  Lab 06/17/21 0130 06/18/21 0627 06/19/21 0531  WBC 9.2 4.8 5.5  NEUTROABS 7.1  --  2.5  HGB 11.2* 9.8* 11.1*  HCT 36.3 32.0* 36.9  MCV 83.4 84.4 85.6  PLT 493* 379 426*        Signed:  Meredeth Ide MD.  [facility] 06/19/2021, 2:15 PM

## 2021-06-19 NOTE — Plan of Care (Signed)
No s/s of acute distress or pain reported or observed; call light and cell phone within reach; bed at lowest position for safety. Pt reports pain relief with new Rx added.

## 2021-06-20 ENCOUNTER — Telehealth: Payer: Self-pay

## 2021-06-20 DIAGNOSIS — Z8639 Personal history of other endocrine, nutritional and metabolic disease: Secondary | ICD-10-CM | POA: Insufficient documentation

## 2021-06-20 DIAGNOSIS — Z8619 Personal history of other infectious and parasitic diseases: Secondary | ICD-10-CM | POA: Insufficient documentation

## 2021-06-20 NOTE — Telephone Encounter (Signed)
Transition Care Management Unsuccessful Follow-up Telephone Call  Date of discharge and from where:  6/15/2 [facility]  Diagnosis: colitis   Attempts:  3rd Attempt  Reason for unsuccessful TCM follow-up call:  Unable to reach patient  I now see that patient was readmitted to [facility] and d/c 6/22 - will attempt TCM

## 2021-06-20 NOTE — Telephone Encounter (Signed)
Transition Care Management Unsuccessful Follow-up Telephone Call  Date of discharge and from where:  [facility] 06/19/21  Diagnosis: pyelonephritis   Attempts:  1st Attempt  Reason for unsuccessful TCM follow-up call:  Left voice message

## 2021-06-25 NOTE — Telephone Encounter (Signed)
Transition Care Management Unsuccessful Follow-up Telephone Call  Date of discharge and from where:  [facility] 06/19/21   Diagnosis:  pyelonephritis   Attempts:  2nd Attempt  Reason for unsuccessful TCM follow-up call:  Unable to reach patient

## 2021-07-15 ENCOUNTER — Other Ambulatory Visit: Payer: Self-pay | Admitting: *Deleted

## 2021-07-15 DIAGNOSIS — F419 Anxiety disorder, unspecified: Secondary | ICD-10-CM

## 2021-07-15 MED ORDER — FLUOXETINE HCL 20 MG PO CAPS: 60.0000 mg | ORAL_CAPSULE | Freq: Every morning | ORAL | 0 refills | Status: AC

## 2021-07-24 ENCOUNTER — Other Ambulatory Visit: Payer: Self-pay

## 2021-07-24 ENCOUNTER — Emergency Department: Payer: Auto Insurance (includes no fault)

## 2021-07-24 ENCOUNTER — Emergency Department
Admission: EM | Admit: 2021-07-24 | Discharge: 2021-07-24 | Disposition: A | Discharge status: Home / Self Care | Payer: Auto Insurance (includes no fault) | Attending: Emergency Medicine | Admitting: Emergency Medicine

## 2021-07-24 DIAGNOSIS — S161XXA Strain of muscle, fascia and tendon at neck level, initial encounter: Secondary | ICD-10-CM | POA: Insufficient documentation

## 2021-07-24 DIAGNOSIS — S39012A Strain of muscle, fascia and tendon of lower back, initial encounter: Secondary | ICD-10-CM | POA: Insufficient documentation

## 2021-07-24 DIAGNOSIS — R000 Tachycardia, unspecified: Secondary | ICD-10-CM

## 2021-07-24 DIAGNOSIS — Y92411 Interstate highway as the place of occurrence of the external cause: Secondary | ICD-10-CM | POA: Insufficient documentation

## 2021-07-24 LAB — COMPREHENSIVE METABOLIC PANEL
ALT: 9 U/L (ref 0–55)
AST (SGOT): 17 U/L (ref 5–34)
Albumin/Globulin Ratio: 0.9 (ref 0.9–2.2)
Albumin: 3.6 g/dL (ref 3.5–5.0)
Alkaline Phosphatase: 75 U/L (ref 37–117)
Anion Gap: 10 (ref 5.0–15.0)
BUN: 10 mg/dL (ref 7.0–19.0)
Bilirubin, Total: 0.2 mg/dL (ref 0.2–1.2)
CO2: 22 mEq/L (ref 22–29)
Calcium: 9.8 mg/dL (ref 8.5–10.5)
Chloride: 108 mEq/L (ref 100–111)
Creatinine: 0.8 mg/dL (ref 0.6–1.0)
Globulin: 3.9 g/dL — ABNORMAL HIGH (ref 2.0–3.6)
Glucose: 112 mg/dL — ABNORMAL HIGH (ref 70–100)
Potassium: 3.7 mEq/L (ref 3.5–5.1)
Protein, Total: 7.5 g/dL (ref 6.0–8.3)
Sodium: 140 mEq/L (ref 136–145)

## 2021-07-24 LAB — ECG 12-LEAD
Atrial Rate: 157 {beats}/min
P Axis: 67 degrees
P-R Interval: 126 ms
Q-T Interval: 254 ms
QRS Duration: 62 ms
QTC Calculation (Bezet): 410 ms
R Axis: 62 degrees
T Axis: -20 degrees
Ventricular Rate: 157 {beats}/min

## 2021-07-24 LAB — CBC AND DIFFERENTIAL
Absolute NRBC: 0 10*3/uL (ref 0.00–0.00)
Basophils Absolute Automated: 0.06 10*3/uL (ref 0.00–0.08)
Basophils Automated: 0.6 %
Eosinophils Absolute Automated: 0.02 10*3/uL (ref 0.00–0.44)
Eosinophils Automated: 0.2 %
Hematocrit: 33.4 % — ABNORMAL LOW (ref 34.7–43.7)
Hgb: 10.5 g/dL — ABNORMAL LOW (ref 11.4–14.8)
Immature Granulocytes Absolute: 0.04 10*3/uL (ref 0.00–0.07)
Immature Granulocytes: 0.4 %
Lymphocytes Absolute Automated: 2.48 10*3/uL (ref 0.42–3.22)
Lymphocytes Automated: 23.1 %
MCH: 25.8 pg (ref 25.1–33.5)
MCHC: 31.4 g/dL — ABNORMAL LOW (ref 31.5–35.8)
MCV: 82.1 fL (ref 78.0–96.0)
MPV: 8.9 fL (ref 8.9–12.5)
Monocytes Absolute Automated: 1.19 10*3/uL — ABNORMAL HIGH (ref 0.21–0.85)
Monocytes: 11.1 %
Neutrophils Absolute: 6.93 10*3/uL — ABNORMAL HIGH (ref 1.10–6.33)
Neutrophils: 64.6 %
Nucleated RBC: 0 /100 WBC (ref 0.0–0.0)
Platelets: 787 10*3/uL — ABNORMAL HIGH (ref 142–346)
RBC: 4.07 10*6/uL (ref 3.90–5.10)
RDW: 16 % — ABNORMAL HIGH (ref 11–15)
WBC: 10.72 10*3/uL — ABNORMAL HIGH (ref 3.10–9.50)

## 2021-07-24 LAB — GFR
EGFR: >60
EGFR: >60

## 2021-07-24 LAB — I-STAT CREATININE: Creatinine I-Stat: 0.7 mg/dL (ref 0.6–1.3)

## 2021-07-24 MED ORDER — ONDANSETRON HCL 4 MG/2ML IJ SOLN
4.0000 mg | Freq: Once | INTRAMUSCULAR | Status: AC
Start: 2021-07-24 — End: 2021-07-24
  Administered 2021-07-24: 4 mg via INTRAVENOUS
  Filled 2021-07-24: qty 2

## 2021-07-24 MED ORDER — ADENOSINE 6 MG/2ML IV SOLN
6.0000 mg | Freq: Once | INTRAVENOUS | Status: AC
Start: 2021-07-24 — End: 2021-07-24

## 2021-07-24 MED ORDER — MORPHINE SULFATE 4 MG/ML IJ/IV SOLN (WRAP)
4.0000 mg | Freq: Once | Status: AC
Start: 2021-07-24 — End: 2021-07-24
  Administered 2021-07-24: 4 mg via INTRAVENOUS
  Filled 2021-07-24: qty 1

## 2021-07-24 MED ORDER — SODIUM CHLORIDE 0.9 % IV BOLUS
1000.0000 mL | Freq: Once | INTRAVENOUS | Status: AC
Start: 2021-07-24 — End: 2021-07-24
  Administered 2021-07-24: 1000 mL via INTRAVENOUS

## 2021-07-24 MED ORDER — DIPHENHYDRAMINE HCL 50 MG/ML IJ SOLN
50.0000 mg | Freq: Once | INTRAMUSCULAR | Status: AC
Start: 2021-07-24 — End: 2021-07-24
  Administered 2021-07-24: 50 mg via INTRAVENOUS
  Filled 2021-07-24: qty 1

## 2021-07-24 MED ORDER — ADENOSINE 6 MG/2ML IV SOLN
INTRAVENOUS | Status: DC
Start: 2021-07-24 — End: 2021-07-24
  Filled 2021-07-24: qty 2

## 2021-07-24 MED ORDER — ADENOSINE 6 MG/2ML IV SOLN
INTRAVENOUS | Status: AC
Start: 2021-07-24 — End: 2021-07-24
  Administered 2021-07-24: 6 mg via INTRAVENOUS
  Filled 2021-07-24: qty 6

## 2021-07-24 NOTE — ED Notes (Signed)
MD Mechanick at bedside.

## 2021-07-24 NOTE — ED Notes (Addendum)
HR 154 after medication. Pt awake and alert. Hold on 12mg .

## 2021-07-24 NOTE — ED Notes (Signed)
Bed: N 30  Expected date:   Expected time:   Means of arrival:   Comments:  M[facility] SVT medic

## 2021-07-24 NOTE — ED Notes (Signed)
CT requesting istat beta. Pt states she is post menopausal and has not had period in 5 years. To sign form that states is not pregnant in CT.

## 2021-07-24 NOTE — Discharge Instructions (Signed)
Dear [name]:    Thank you for choosing the [facility], the premier emergency department in the [place] area.  I hope your visit today was EXCELLENT.    Specific instructions for your visit today:        If you do not continue to improve or your condition worsens, please contact your doctor or return immediately to the Emergency Department.    Sincerely,  Maurine Minister, MD  Attending Emergency Physician  [facility]    ONSITE PHARMACY  Our full service onsite pharmacy is located in the ER waiting room.  Open 7 days a week from 9 am to 11 pm.  We accept all major insurances and prices are competitive with major retailers.  Ask your provider to print your prescriptions down to the pharmacy to speed you on your way home.    OBTAINING A PRIMARY CARE APPOINTMENT    Primary care physicians (PCPs, also known as primary care doctors) are either internists or family medicine doctors. Both types of PCPs focus on health promotion, disease prevention, patient education and counseling, and treatment of acute and chronic medical conditions.    Call for an appointment with a primary care doctor.  Ask to see who is taking new patients.     [facility]  telephone:  [phone]  [email or web address]    DOCTOR REFERRALS  Call [phone] (available 24 hours a day, 7 days a week) if you need any further referrals and we can help you find a primary care doctor or specialist.  Also, available online at:  [email or web address]    YOUR CONTACT INFORMATION  Before leaving please check with registration to make sure we have an up-to-date contact number.  You can call registration at [phone] to update your information.  For questions about your hospital bill, please call [phone].  For questions about your Emergency Dept Physician bill please call [phone].      FREE HEALTH SERVICES  If you need help with health or social services,  please call 2-1-1 for a free referral to resources in your area.  2-1-1 is a free service connecting people with information on health insurance, free clinics, pregnancy, mental health, dental care, food assistance, housing, and substance abuse counseling.  Also, available online at:  http://www.[place].org    MEDICAL RECORDS AND TESTS  Certain laboratory test results do not come back the same day, for example urine cultures.   We will contact you if other important findings are noted.  Radiology films are often reviewed again to ensure accuracy.  If there is any discrepancy, we will notify you.      Please call [phone] to pick up a complimentary CD of any radiology studies performed.  If you or your doctor would like to request a copy of your medical records, please call [phone].      ORTHOPEDIC INJURY   Please know that significant injuries can exist even when an initial x-ray is read as normal or negative.  This can occur because some fractures (broken bones) are not initially visible on x-rays.  For this reason, close outpatient follow-up with your primary care doctor or bone specialist (orthopedist) is required.    MEDICATIONS AND FOLLOWUP  Please be aware that some prescription medications can cause drowsiness.  Use caution when driving or operating machinery.    The examination and treatment you have received in our Emergency Department is provided on an emergency basis, and is not intended to  be a substitute for your primary care physician.  It is important that your doctor checks you again and that you report any new or remaining problems at that time.      [place]  The nearest 24 hour pharmacy is:    [facility] [place] [place] [address]  [place]  [facility])  Call to start or finish an application, compare plans, enroll or ask a question.  [place]: [phone]  Web:   Healthcare.gov    Help Enrolling in [place]  ([phone] 010-9363 (TOLL-FREE)  [phone] (TTY)  Web:  Http://www.coverva.org    Local Help Enrolling in the [place]  4790954713 (MAIN)  Email:  health-help@nvfs .org  Web:  http://lewis-perez.info/  Address:  [address], [address] [place], [place] [address]    SEDATING MEDICATIONS  Sedating medications include strong pain medications (e.g. narcotics), muscle relaxers, benzodiazepines (used for anxiety and as muscle relaxers), Benadryl/diphenhydramine and other antihistamines for allergic reactions/itching, and other medications.  If you are unsure if you have received a sedating medication, please ask your physician or nurse.  If you received a sedating medication: DO NOT drive a car. DO NOT operate machinery. DO NOT perform jobs where you need to be alert.  DO NOT drink alcoholic beverages while taking this medicine.     If you get dizzy, sit or lie down at the first signs. Be careful going up and down stairs.  Be extra careful to prevent falls.     Never give this medicine to others.     Keep this medicine out of reach of children.     Do not take or save old medicines. Throw them away when outdated.     Keep all medicines in a cool, dry place. DO NOT keep them in your bathroom medicine cabinet or in a cabinet above the stove.    MEDICATION REFILLS  Please be aware that we cannot refill any prescriptions through the ER. If you need further treatment from what is provided at your ER visit, please follow up with your primary care doctor or your pain management specialist.    [place]  Did you know [facility] has two freestanding ERs located just a few miles away?  [facility] [place] have short wait times, easy free parking directly in front of the building and top patient satisfaction scores - and the same Board Certified Emergency  Medicine doctors as [facility].

## 2021-07-24 NOTE — ED Notes (Signed)
Adenisone 6MG  given by RN Toma Deiters through [facility].

## 2021-07-24 NOTE — ED Notes (Signed)
Attempting IV access.
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
## 2021-07-24 NOTE — ED Notes (Signed)
Pt was ambulating around ED. Redirected back to room and placed on monitor. Instructed not to stand up without RN.

## 2021-07-24 NOTE — ED Notes (Signed)
IV infilated. EMT to attempt to sono.

## 2021-07-24 NOTE — ED Notes (Signed)
While in CT, pt endorsed allergy to CT contrast. States gets hives and palpitations and receives IV benadryl prior to scan. CT abdomen with IV held. MD Mechanick messaged and to put in medication.
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
## 2021-07-27 ENCOUNTER — Emergency Department: Admit: 2021-07-28 | Payer: PRIVATE HEALTH INSURANCE

## 2021-07-27 ENCOUNTER — Emergency Department: Payer: PRIVATE HEALTH INSURANCE

## 2021-07-27 DIAGNOSIS — S4992XA Unspecified injury of left shoulder and upper arm, initial encounter: Secondary | ICD-10-CM
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
## 2021-07-28 ENCOUNTER — Emergency Department: Admit: 2021-07-28 | Payer: PRIVATE HEALTH INSURANCE

## 2021-07-28 ENCOUNTER — Inpatient Hospital Stay
Admit: 2021-07-28 | Discharge: 2021-07-28 | Disposition: A | Discharge status: Home / Self Care | Payer: PRIVATE HEALTH INSURANCE | Attending: Emergency Medicine

## 2021-07-28 NOTE — ED Notes (Signed)
I have reviewed discharge instructions with the patient.  The patient verbalized understanding.  Patient armband removed and shredded

## 2021-12-04 LAB — CLOSTRIDIUM DIFFICILE BY PCR, REFLEXED: Toxigenic C. Difficile by PCR: NEGATIVE

## 2021-12-22 ENCOUNTER — Emergency Department: Payer: BLUE CROSS/BLUE SHIELD

## 2021-12-22 ENCOUNTER — Emergency Department
Admission: EM | Admit: 2021-12-22 | Discharge: 2021-12-22 | Disposition: A | Discharge status: Home / Self Care | Payer: BLUE CROSS/BLUE SHIELD | Attending: Family Medicine | Admitting: Family Medicine

## 2021-12-22 DIAGNOSIS — R1030 Lower abdominal pain, unspecified: Secondary | ICD-10-CM

## 2021-12-22 DIAGNOSIS — R1033 Periumbilical pain: Secondary | ICD-10-CM | POA: Insufficient documentation

## 2021-12-22 DIAGNOSIS — R1031 Right lower quadrant pain: Secondary | ICD-10-CM | POA: Insufficient documentation

## 2021-12-22 DIAGNOSIS — R110 Nausea: Secondary | ICD-10-CM | POA: Insufficient documentation

## 2021-12-22 DIAGNOSIS — R1032 Left lower quadrant pain: Secondary | ICD-10-CM | POA: Insufficient documentation

## 2021-12-22 LAB — COMPREHENSIVE METABOLIC PANEL
ALT: 16 U/L (ref 0–55)
AST (SGOT): 18 U/L (ref 10–42)
Albumin/Globulin Ratio: 1 Ratio (ref 0.80–2.00)
Albumin: 3.5 gm/dL (ref 3.5–5.0)
Alkaline Phosphatase: 72 U/L (ref 40–145)
Anion Gap: 13.5 mMol/L (ref 7.0–18.0)
BUN / Creatinine Ratio: 10.3 Ratio (ref 10.0–30.0)
BUN: 8 mg/dL (ref 7–22)
Bilirubin, Total: 0.2 mg/dL (ref 0.1–1.2)
CO2: 22 mMol/L (ref 20.0–30.0)
Calcium: 9.3 mg/dL (ref 8.5–10.5)
Chloride: 105 mMol/L (ref 98–110)
Creatinine: 0.78 mg/dL (ref 0.60–1.20)
EGFR: 102 mL/min/{1.73_m2} (ref 60–150)
Globulin: 3.5 gm/dL (ref 2.0–4.0)
Glucose: 100 mg/dL — ABNORMAL HIGH (ref 71–99)
Osmolality Calculated: 272 mOsm/kg — ABNORMAL LOW (ref 275–300)
Potassium: 3.5 mMol/L (ref 3.5–5.3)
Protein, Total: 7 gm/dL (ref 6.0–8.3)
Sodium: 137 mMol/L (ref 136–147)

## 2021-12-22 LAB — VH URINALYSIS WITH MICROSCOPIC AND CULTURE IF INDICATED
Bilirubin, UA: NEGATIVE mg/dL
Glucose, UA: NEGATIVE mg/dL
Ketones UA: NEGATIVE mg/dL
Nitrite, UA: NEGATIVE
Protein, UR: NEGATIVE mg/dL
Urine Specific Gravity: 1.025 (ref 1.001–1.040)
Urobilinogen, UA: 0.2 mg/dL
pH, Urine: 6 pH (ref 5.0–8.0)

## 2021-12-22 LAB — CBC AND DIFFERENTIAL
Basophils %: 0.6 % (ref 0.0–3.0)
Basophils Absolute: 0.1 10*3/uL (ref 0.0–0.3)
Eosinophils %: 1.5 % (ref 0.0–7.0)
Eosinophils Absolute: 0.1 10*3/uL (ref 0.0–0.8)
Hematocrit: 32.4 % — ABNORMAL LOW (ref 36.0–48.0)
Hemoglobin: 10.1 gm/dL — ABNORMAL LOW (ref 12.0–16.0)
Lymphocytes Absolute: 2.4 10*3/uL (ref 0.6–5.1)
Lymphocytes: 27.1 % (ref 15.0–46.0)
MCH: 24 pg — ABNORMAL LOW (ref 28–35)
MCHC: 31 gm/dL (ref 31–36)
MCV: 76 fL — ABNORMAL LOW (ref 80–100)
MPV: 6.4 fL (ref 6.0–10.0)
Monocytes Absolute: 0.7 10*3/uL (ref 0.1–1.7)
Monocytes: 8.1 % (ref 3.0–15.0)
Neutrophils %: 62.6 % (ref 42.0–78.0)
Neutrophils Absolute: 5.5 10*3/uL (ref 1.7–8.6)
PLT CT: 375 10*3/uL (ref 130–440)
RBC: 4.26 10*6/uL (ref 3.80–5.00)
RDW: 18.1 % — ABNORMAL HIGH (ref 10.5–14.5)
WBC: 8.8 10*3/uL (ref 4.0–11.0)

## 2021-12-22 LAB — ETHANOL: Alcohol: <10 mg/dL (ref 0–9)

## 2021-12-22 LAB — HCG QUANTITATIVE: BHCG Quant.: 8010.4 m[IU]/mL

## 2021-12-22 LAB — LIPASE: Lipase: <4 U/L — ABNORMAL LOW (ref 8–78)

## 2021-12-22 MED ORDER — ONDANSETRON HCL 4 MG/2ML IJ SOLN
INTRAMUSCULAR | Status: AC
Start: 2021-12-22 — End: ?
  Filled 2021-12-22: qty 2

## 2021-12-22 MED ORDER — ONDANSETRON HCL 4 MG/2ML IJ SOLN
4.0000 mg | Freq: Once | INTRAMUSCULAR | Status: AC
Start: 2021-12-22 — End: 2021-12-22
  Administered 2021-12-22: 05:00:00 4 mg via INTRAVENOUS

## 2021-12-22 MED ORDER — DIPHENHYDRAMINE HCL 50 MG/ML IJ SOLN
INTRAMUSCULAR | Status: AC
Start: 2021-12-22 — End: ?
  Filled 2021-12-22: qty 1

## 2021-12-22 MED ORDER — VH SODIUM CHLORIDE 0.9 % IV BOLUS
1000.0000 mL | Freq: Once | INTRAVENOUS | Status: AC
Start: 2021-12-22 — End: 2021-12-22
  Administered 2021-12-22: 05:00:00 1000 mL via INTRAVENOUS

## 2021-12-22 MED ORDER — SODIUM CHLORIDE (PF) 0.9 % IJ SOLN
20.0000 mg | Freq: Once | INTRAVENOUS | Status: AC
Start: 2021-12-22 — End: 2021-12-22
  Administered 2021-12-22: 05:00:00 20 mg via INTRAVENOUS

## 2021-12-22 MED ORDER — FAMOTIDINE 10 MG/ML IV SOLN (WRAP)
INTRAVENOUS | Status: AC
Start: 2021-12-22 — End: ?
  Filled 2021-12-22: qty 2

## 2021-12-22 MED ORDER — VH HYDROMORPHONE HCL PF 1 MG/ML CARPUJECT
INTRAMUSCULAR | Status: AC
Start: 2021-12-22 — End: ?
  Filled 2021-12-22: qty 1

## 2021-12-22 MED ORDER — DIPHENHYDRAMINE HCL 50 MG/ML IJ SOLN
25.0000 mg | Freq: Once | INTRAMUSCULAR | Status: AC
Start: 2021-12-22 — End: 2021-12-22
  Administered 2021-12-22: 05:00:00 25 mg via INTRAVENOUS

## 2021-12-22 MED ORDER — METHYLPREDNISOLONE SODIUM SUCC 125 MG IJ SOLR
INTRAMUSCULAR | Status: AC
Start: 2021-12-22 — End: ?
  Filled 2021-12-22: qty 2

## 2021-12-22 MED ORDER — VH HYDROMORPHONE HCL PF 1 MG/ML CARPUJECT
0.5000 mg | Freq: Once | INTRAMUSCULAR | Status: AC
Start: 2021-12-22 — End: 2021-12-22
  Administered 2021-12-22: 05:00:00 0.5 mg via INTRAVENOUS

## 2021-12-22 MED ORDER — METHYLPREDNISOLONE SODIUM SUCC 125 MG IJ SOLR
125.0000 mg | Freq: Once | INTRAMUSCULAR | Status: AC
Start: 2021-12-22 — End: 2021-12-22
  Administered 2021-12-22: 05:00:00 125 mg via INTRAVENOUS

## 2021-12-22 MED ORDER — IOHEXOL 350 MG/ML IV SOLN
80.0000 mL | Freq: Once | INTRAVENOUS | Status: AC | PRN
Start: 2021-12-22 — End: 2021-12-22
  Administered 2021-12-22: 06:00:00 80 mL via INTRAVENOUS

## 2021-12-22 NOTE — ED Notes (Signed)
[place] papers discussed with pt. No questions or concerns at this time. Pt sleepy initially, but woke up more when this RN turned lights on. Pt states she has an ob/gyn in [place] who she will follow up with. Pt wheeled out of ER with no signs of distress noted.

## 2021-12-22 NOTE — EDIE (Signed)
COLLECTIVE?NOTIFICATION?12/22/2021 04:11?[name], [name]?MRN: [id number]    [facility]'s patient encounter information:   VWU:[id number]  Account [id number]  Billing Account [id number]      Criteria Met      3+Facilities in 90 Days    High Utilization (6+ ED Visits/6 Mo.)    Narx Scores Alert    Security and Safety  No Security Events were found.  ED Care Guidelines  There are currently no ED Care Guidelines for this patient. Please check your facility's medical records system.    Flags      Negative COVID-19 Lab Result - [facility] - A specimen collected from this patient was negative for COVID-19 / Attributed By: [place] [facility] / Attributed On: 11/22/2021       Prescription Monitoring Program  523??- Narcotic Use Score  220??- Sedative Use Score  000??- Stimulant Use Score  610??- Overdose Risk Score  - All Scores range from 000-999 with 75% of the population scoring < 200 and on 1% scoring above 650  - The last digit of the narcotic, sedative, and stimulant score indicates the number of active prescriptions of that type  - Higher Use scores correlate with increased prescribers, pharmacies, mg equiv, and overlapping prescriptions  - Higher Overdose Risk Scores correlate with increased risk of unintentional overdose death   Concerning or unexpectedly high scores should prompt a review of the PMP record; this does not constitute checking PMP for prescribing purposes.    E.D. Visit Count (12 mo.)  Facility Visits   [facility] 1   [facility]   [facility] 3   [facility] 1   [facility] 6   [facility] 6   [facility] 6   [facility] 1   [facility] 1   [facility] 1   [facility] 1   [facility] 1   [facility] 2   [facility] 2   [facility] 3   [facility] 1   [facility] 1   Total 38   Note: Visits indicate total known visits.     Recent Emergency Department Visit Summary  Showing 10 most recent visits out of 38 in the past 12 months   Date Facility [facility] Type Diagnoses or Chief Complaint    Dec 22, 2021  [facility]  [place]  Emergency      Abdominal Pain; Fever      Dec 12, 2021  [facility]  [place]  [place]  Emergency      abd pain      Abdominal Pain      Unspecified abdominal pain      Other specified pregnancy related conditions, first trimester      Less than [redacted] weeks gestation of pregnancy      Nov 23, 2021  [facility]  [place].  [place]  Emergency      ABD PAIN      Prenatal Abdominal Pain      Other specified pregnancy related conditions, first trimester      Unspecified abdominal pain      Nov 20, 2021  [facility]  [place].  [place]  Emergency      abdominal issue      Abdominal Pain      Tachycardia      Generalized abdominal pain      Right lower quadrant pain      Nov 18, 2021  [facility]  [facility]  Emergency      Pain, Abdomen      Nov 17, 2021  [facility]  [place].  [place]  Emergency      Abd pain      Abdominal Pain      Endocrine disorder, unspecified      Right lower quadrant pain      Intrauterine synechiae      Polycystic ovarian syndrome      Crohn's disease, unspecified, without complications      Nov 16, 2021  [facility]  [place].  [place]  Emergency      Abdominal pain      Right lower quadrant pain      Other specified pregnancy related conditions, first trimester      Unspecified abdominal pain      Oct 13, 2021  [facility]  [place].  [place]  Emergency      abd pain      Urinary Frequency      Abdominal Pain      Urinary tract infection, site  not specified      Oct 13, 2021  [facility].  [place]  Emergency      Procedure and treatment not carried out due to patient leaving prior to being seen by health care provider      Unspecified abdominal pain      Oct 09, 2021  [facility]  [place]  [place]  Emergency      bloody stool      Lower abdominal pain, unspecified      Melena      Nausea with vomiting, unspecified        Recent Inpatient Visit Summary  Date Facility [facility] Type Diagnoses or Chief Complaint    Aug 31, 2021  [facility]  [place]  Medical Surgical      Crohn's disease, unspecified, with unspecified complications      Cushing's syndrome, unspecified      Unspecified psychosis not due to a substance or known physiological condition      Intrauterine synechiae      Hypothyroidism, unspecified      Constipation, unspecified      Hormone replacement therapy      Long term (current) use of opiate analgesic      Contact with and (suspected) exposure to COVID-19      Anxiety disorder, unspecified      Aug 19, 2021  [facility].  [place]  Inpatient      Long term (current) use of opiate analgesic      Anxiety disorder, unspecified      Other chronic pain      Crohn's disease, unspecified, without complications      Dehydration      Supraventricular tachycardia      Autoimmune thyroiditis      Depression, unspecified      Jun 03, 2021  [facility].  [place]  General Medicine  Thrombocytosis, unspecified      Helicobacter pylori [H. pylori] as the cause of diseases classified elsewhere      Autoimmune thyroiditis      Intrauterine synechiae      Personal history of nicotine dependence      Crohn's disease, unspecified, without complications      Panic disorder [episodic paroxysmal anxiety]      Anemia, unspecified      Contact with and (suspected) exposure to COVID-19      Depression, unspecified      May 30, 2021  [facility].  MD  General Medicine      Other long term (current) drug  therapy      Contact with and (suspected) exposure to COVID-19      Panic disorder [episodic paroxysmal anxiety]      Polyneuropathy, unspecified      Autoimmune thyroiditis      Personal history of nicotine dependence      Depression, unspecified      Crohn's disease, unspecified, without complications      Personal history of other infectious and parasitic diseases      Anemia, unspecified      Mar 26, 2021  [facility].  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  MD  General Medicine      Crohn's disease, unspecified, without complications      Hypothyroidism, unspecified      Generalized abdominal pain      Anemia, unspecified      Other chronic pain      Dehydration      Intrauterine synechiae      Rectal Bleeding      Unspecified abdominal pain      Diarrhea, unspecified      Mar 02, 2021  [facility]  [place].  MD  General Medicine      Nausea with vomiting, unspecified      Intrauterine synechiae      Hypokalemia      Opioid dependence, uncomplicated      Generalized abdominal pain      Abdominal Pain      Crohn's disease, unspecified, without complications      Other specified bacterial intestinal infections      Lower abdominal pain, unspecified      Feb 17, 2021  [facility]  [place].  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  MD  General Medicine      Autoimmune thyroiditis      Acute cystitis without hematuria      Crohn's disease of large intestine with other complication      Lower abdominal pain, unspecified      Abdominal pain      Nausea with vomiting, unspecified      Diarrhea      Diarrhea, unspecified      Other specified hypothyroidism      Dec 31, 2020  [facility]  [place].  MD  General Medicine      Personal history of other diseases of the digestive system      Lower abdominal pain, unspecified      Nausea with vomiting, unspecified      abd pain      Emesis      Diarrhea      COVID-19      Rectal Bleeding      Hemorrhage of anus and rectum      Crohn's disease, unspecified, without complications        Care Team  Provider Specialty Phone Fax Service Dates   PCP, [facility] [phone]  Current      Collective Portal  This patient has registered at the [facility] Emergency Department   For more information visit: [email or web address]     PLEASE NOTE:     1.   Any care recommendations and other clinical information are provided as guidelines or for historical purposes only, and providers should exercise their own clinical judgment when providing care.    2.   You may only use this information for purposes of treatment, payment or health care operations activities, and subject to the limitations of applicable Collective Policies.    3.   You should consult directly with the organization that provided a care guideline or other clinical history with any questions about additional information or accuracy or completeness of information provided.    ? 2022 [place], [employer] - [email or web address]

## 2021-12-22 NOTE — ED Notes (Signed)
Pt states she fell on the ice a few days ago. Swollen hard area noted to the lower middle black. Pt states she landed flat on her back on a sheet of ice in her parents driveway.

## 2021-12-22 NOTE — ED Provider Notes (Signed)
EMERGENCY DEPARTMENT HISTORY AND PHYSICAL EXAM    Date: 12/22/21  Patient Name: [name]  Attending Physician: No att. providers found  Patient DOB:  03-Aug-1986  MRN:  [id number]  Room:  E12/EDA12-A      History of Presenting Illness     Chief Complaint: Lower abdominal pain, recent D&C     HPI/ROS is limited by: none  HPI/ROS given by: Patient and Family       Context: [name] is a 35 y.o. female who presents with having had a pregnancy terminated on 12- 21-22 and she is her tonight for lower abdominal pain and she is concerned that she may have retained products because that has happened to her before.  Location: abdomen  Severity: severe  Duration: 1 day   Quality: cramping   Associated Signs/ Symptoms: lower abdominal pain after D&C  Exacerbation/Mitigating factors: touching the abdomen makes it hurt more       PMD: Pcp, None, MD    Past Medical History     Past Medical History:   Diagnosis Date    Abortion     Alcohol abuse     Anxiety     Asherman's syndrome     Crohn's disease     Depression     H/O Hashimoto thyroiditis     hypothyroidism    PCOS (polycystic ovarian syndrome)     Tachycardia        Past Surgical History     Past Surgical History:   Procedure Laterality Date    BREAST IMPLANT      COLONOSCOPY      D & C, SUCTION N/A 08/24/2014    Procedure: D & C, SUCTION;  Surgeon: Maryruth Hancock, MD;  Location:  MAIN OR;  Service: Obstetrics;  Laterality: N/A;    INDUCED ABORTION         Family History     History reviewed. No pertinent family history.    Social History     Social History     Socioeconomic History    Marital status: Single     Spouse name: Not on file    Number of children: Not on file    Years of education: Not on file    Highest education level: Not on file   Occupational History    Not on file   Tobacco Use    Smoking status: Former     Packs/day: 0.25     Types: Cigarettes     Quit date: 12/30/2007     Years since quitting: 13.9    Smokeless tobacco: Never    Vaping Use    Vaping Use: Never used   Substance and Sexual Activity    Alcohol use: Yes     Comment: socially    Drug use: No    Sexual activity: Yes     Partners: Male     Birth control/protection: Condom   Other Topics Concern    Not on file   Social History Narrative    Not on file     Social Determinants of Health     Financial Resource Strain: Not on file   Food Insecurity: Not on file   Transportation Needs: Not on file   Physical Activity: Not on file   Stress: Not on file   Social Connections: Not on file   Intimate Partner Violence: Not on file   Housing Stability: Not on file       Allergies  Allergies   Allergen Reactions    Bentyl [Dicyclomine] Hives    Bactrim [Sulfamethoxazole W/Trimethoprim (Co-Trimoxazole)] Rash    Compazine [Prochlorperazine] Rash    Contrast [Iodinated Contrast Media] Hives and Palpitations    Reglan [Metoclopramide] Rash    Sulfa Antibiotics Rash       Home Medications     Prior to Admission medications    Medication Sig Start Date End Date Taking? Authorizing Provider   ALPRAZolam Prudy Feeler) 1 MG tablet Take 1 mg by mouth 2 (two) times daily   Yes [provider]   FLUoxetine (PROZAC) 40 MG capsule Take 60 mg by mouth daily 04/08/17  Yes [provider]   levothyroxine (SYNTHROID, LEVOTHROID) 88 MCG tablet Take 88 mcg by mouth daily.   Yes [provider]   ondansetron (ZOFRAN-ODT) 4 MG disintegrating tablet Take 1 tablet (4 mg total) by mouth every 6 (six) hours as needed for Nausea 05/30/19  Yes Hermina Staggers, PA   zolpidem (AMBIEN CR) 12.5 MG CR tablet Take 10 mg by mouth nightly as needed for Sleep   Yes [provider]   buprenorphine (BELBUCA) 600 MCG per buccal film Place inside cheek    [provider]   LORazepam (ATIVAN) 1 MG tablet Take by mouth    [provider]   oxyCODONE (ROXICODONE) 10 MG immediate release tablet Take 20 mg by mouth every 6 (six) hours as needed    [provider]   promethazine  (PHENERGAN) 12.5 MG suppository Place 1 suppository (12.5 mg total) rectally every 6 (six) hours as needed for Nausea (as needed for severe vomiting) 02/10/19   Diegelmann, Lyman Speller, MD       ED Medications Administered     ED Medication Orders (From admission, onward)      Start Ordered     Status Ordering Provider    12/22/21 0546 12/22/21 0547  iohexol (OMNIPAQUE) 350 MG/ML injection 80 mL  IMG once as needed        Route: Intravenous  Ordered Dose: 80 mL     Last MAR action: Imaging Agent Given Minette Brine II    12/22/21 0504 12/22/21 0503  sodium chloride 0.9 % bolus 1,000 mL  Once in ED        Route: Intravenous  Ordered Dose: 1,000 mL     Last MAR action: Stopped Zyniah Ferraiolo J II    12/22/21 0451 12/22/21 0450  HYDROmorphone (DILAUDID) injection 0.5 mg  Once in ED        Route: Intravenous  Ordered Dose: 0.5 mg     Last MAR action: Given Sherol Sabas J II    12/22/21 0451 12/22/21 0450  ondansetron (ZOFRAN) injection 4 mg  Once in ED        Route: Intravenous  Ordered Dose: 4 mg     Last MAR action: Given Khaleb Broz J II    12/22/21 0425 12/22/21 0424  methylPREDNISolone sodium succinate (Solu-MEDROL) injection 125 mg  Once in ED        Route: Intravenous  Ordered Dose: 125 mg     Last MAR action: Given Laryssa Hassing J II    12/22/21 0425 12/22/21 0424  diphenhydrAMINE (BENADRYL) injection 25 mg  Once in ED        Route: Intravenous  Ordered Dose: 25 mg     Last MAR action: Given Devyon Keator J II    12/22/21 0425 12/22/21 0424  famotidine (PEPCID) injection 20 mg  Once        Route: Intravenous  Ordered Dose: 20 mg     Last MAR action: Given Bronsen Serano J II              Review of Systems     Review of Systems   Constitutional:  Negative for chills and fever.   HENT:  Negative for ear pain and sore throat.    Eyes:  Negative for discharge and redness.   Respiratory:  Negative for cough and shortness of breath.    Cardiovascular:  Negative for chest pain and palpitations.   Gastrointestinal:  Positive for  abdominal pain and nausea. Negative for diarrhea and vomiting.   Genitourinary:  Negative for dysuria and urgency.   Musculoskeletal:  Negative for neck pain.   Skin:  Negative for rash.   Neurological:  Negative for seizures, loss of consciousness and headaches.   All other systems reviewed and are negative.      Physical Exam     Physical Exam  Vitals and nursing note reviewed.   Constitutional:       Appearance: She is well-developed.   HENT:      Head: Normocephalic and atraumatic.      Mouth/Throat:      Mouth: Mucous membranes are moist.      Pharynx: Oropharynx is clear.   Eyes:      Extraocular Movements: Extraocular movements intact.      Pupils: Pupils are equal, round, and reactive to light.   Cardiovascular:      Rate and Rhythm: Normal rate and regular rhythm.      Heart sounds: Normal heart sounds.   Pulmonary:      Effort: Pulmonary effort is normal.      Breath sounds: Normal breath sounds.   Abdominal:      General: Abdomen is flat.      Palpations: Abdomen is soft.      Tenderness: There is abdominal tenderness in the right lower quadrant, periumbilical area, suprapubic area and left lower quadrant.   Skin:     General: Skin is warm and dry.   Neurological:      General: No focal deficit present.      Mental Status: She is alert and oriented to person, place, and time.         Procedures     N/A    Diagnostic Study Results     EKG: N/A    Monitor: N/A    Laboratory results reviewed by ED provider:    Results       Procedure Component Value Units Date/Time    Urinalysis w Microscopic and Culture if Indicated [[id number]]  (Abnormal) Collected: 12/22/21 0505    Specimen: Urine, Random Updated: 12/22/21 0534     Color, UA Yellow     Clarity, UA Slightly Cloudy     Urine Specific Gravity 1.025     pH, Urine 6.0 pH      Protein, UR Negative mg/dL      Glucose, UA Negative mg/dL      Ketones UA Negative mg/dL      Bilirubin, UA Negative mg/dL      Blood, UA Moderate mg/dL      Nitrite, UA Negative      Urobilinogen, UA 0.2 mg/dL      Leukocyte Esterase, UA Trace Leu/uL      UR Micro Performed     WBC, UA 15-20 /hpf      RBC,  UA 3-4 /hpf      Bacteria, UA Moderate /hpf      Squam Epithel, UA TNTC /lpf     Narrative:      A Urine Culture has been ordered based upon the Positive UA results.    CBC and differential [[id number]]  (Abnormal) Collected: 12/22/21 0440    Specimen: Blood Updated: 12/22/21 0525     WBC 8.8 K/cmm      RBC 4.26 M/cmm      Hemoglobin 10.1 gm/dL      Hematocrit 02.7 %      MCV 76 fL      MCH 24 pg      MCHC 31 gm/dL      RDW 25.3 %      PLT CT 375 K/cmm      MPV 6.4 fL      Neutrophils % 62.6 %      Lymphocytes 27.1 %      Monocytes 8.1 %      Eosinophils % 1.5 %      Basophils % 0.6 %      Neutrophils Absolute 5.5 K/cmm      Lymphocytes Absolute 2.4 K/cmm      Monocytes Absolute 0.7 K/cmm      Eosinophils Absolute 0.1 K/cmm      Basophils Absolute 0.1 K/cmm      RBC Morphology RBC Morphology Reviewed     Microcytic 2+     Polychromasia 1+     Hypochromia 2+     Elliptocytes 1+     Poikilocytosis 2+     Target Cells 1+     Tear Drop Cells 1+     Comment: giant platelets and stomatocytes present    Beta HCG Quantitative [[id number]] Collected: 12/22/21 0440    Specimen: Plasma Updated: 12/22/21 0513     BHCG Quant. 8,010.4 mIU/mL     Comprehensive metabolic panel [[id number]]  (Abnormal) Collected: 12/22/21 0440    Specimen: Plasma Updated: 12/22/21 0506     Sodium 137 mMol/L      Potassium 3.5 mMol/L      Chloride 105 mMol/L      CO2 22.0 mMol/L      Calcium 9.3 mg/dL      Glucose 643 mg/dL      Creatinine 3.29 mg/dL      BUN 8 mg/dL      Protein, Total 7.0 gm/dL      Albumin 3.5 gm/dL      Alkaline Phosphatase 72 U/L      ALT 16 U/L      AST (SGOT) 18 U/L      Bilirubin, Total 0.2 mg/dL      Albumin/Globulin Ratio 1.00 Ratio      Anion Gap 13.5 mMol/L      BUN / Creatinine Ratio 10.3 Ratio      EGFR 102 mL/min/1.26m2      Osmolality Calculated 272 mOsm/kg      Globulin 3.5 gm/dL     Lipase  [[id number]]  (Abnormal) Collected: 12/22/21 0440    Specimen: Plasma Updated: 12/22/21 0506     Lipase <4 U/L     Ethanol (Alcohol) Level [[id number]] Collected: 12/22/21 0440    Specimen: Plasma Updated: 12/22/21 0505     Alcohol <10 mg/dL             Radiologic study results reviewed by ED provider:    Radiology Results (24 Hour)       Procedure Component  Value Units Date/Time    CT Abdomen Pelvis with IV Cont [[id number]] Collected: 12/22/21 0550    Order Status: Completed Updated: 12/22/21 0554    Narrative:      CT ABDOMEN PELVIS W IV/ WO PO CONT      CLINICAL HISTORY:  Abdominal infection suspected    ORDERING COMMENTS:   None.      STUDY NOTES:   None.     COMPARISON: July 18, 2017.    TECHNIQUE: Multiple axial CT images were obtained through the abdomen and pelvis after the administration of IOHEXOL 350 MG/ML IV SOLN/80 mL intravenous contrast. Oral contrast was not given.  Coronal and sagittal MPR images were reconstructed. One of   the following dose optimization techniques was utilized in the performance of this exam: Automated exposure control; adjustment of the mA and/or kV according to the patient's size; or use of an iterative  reconstruction technique.  Specific details can   be referenced in the facility's radiology CT exam operational policy.    FINDINGS:    Lung bases: Clear.    Hepatobiliary: The liver is normal. The gallbladder is normal. The common bile duct is normal in size.     Spleen: Normal.    Pancreas: Normal.    Adrenals/Kidneys: The adrenal glands are normal. Kidneys are normal. No stones or hydronephrosis.    Bowel/Mesentery/peritoneum: Appendix is normal. Bowel is normal in caliber.No abnormal bowel wall thickening. No free air. No free fluid.    Lymph nodes: No enlarged lymph nodes.    Vessels: Negative.    Pelvic GU: The bladder is normal. There is heterogeneous enhancement of the uterus that may represent fibroids. The ovaries are not well seen.    Other:  Negative.    Musculoskeletal: There are no suspicious bony lesions.        Impression:        1. No acute disease.  2. Possible uterine fibroids.    ReadingStation:[email or web address]        .    Rendering Provider: Minette Brine II, MD      VS     Patient Vitals for the past 24 hrs:   BP Temp Temp src Pulse Resp SpO2 Height Weight   12/22/21 0600 95/55 -- -- -- -- 94 % -- --   12/22/21 0546 100/58 -- -- -- -- 96 % -- --   12/22/21 0520 91/63 -- -- -- -- 98 % -- --   12/22/21 0513 95/48 -- -- -- -- 100 % -- --   12/22/21 0505 94/43 -- -- -- -- 99 % -- --   12/22/21 0418 109/85 98.3 F (36.8 C) Oral (!) 107 20 99 % 1.549 m (!) 44.5 kg         Clinical Course in Emergency Department     Consults: N/A    Reevaluation: N/A     MDM: The patient presents with lower abdominal pain after an elective D & C and her differential includes perforation, retained products, sepsis, UTI and she is safe for discharge at this time. She will follow up with Ob-Gyn.     Diagnosis and Disposition     Clinical Impression  1. Lower abdominal pain        Disposition  ED Disposition       ED Disposition   Discharge    Condition   --    Date/Time   Sun Dec 22, 2021  6:04 AM    Comment  Baldemar Friday discharge to home/self care.    Condition at disposition: Stable                 Vital signs were reviewed at the time of disposition.  Patient Vitals for the past 24 hrs:   BP Temp Temp src Pulse Resp SpO2 Height Weight   12/22/21 0600 95/55 -- -- -- -- 94 % -- --   12/22/21 0546 100/58 -- -- -- -- 96 % -- --   12/22/21 0520 91/63 -- -- -- -- 98 % -- --   12/22/21 0513 95/48 -- -- -- -- 100 % -- --   12/22/21 0505 94/43 -- -- -- -- 99 % -- --   12/22/21 0418 109/85 98.3 F (36.8 C) Oral (!) 107 20 99 % 1.549 m (!) 44.5 kg          Prescriptions  Discharge Medication List as of 12/22/2021  6:14 AM             Follow-up Information       Baker, [place] A, DO. Call in 1 day(s).    Specialty: Obstetrics and Gynecology  Contact information:  [address] [facility]  [address] [place] [address]  [phone]                                 SIGNED BY: Alger Simons, MD                 Alger Simons, MD  12/22/21 Rickey Primus

## 2021-12-22 NOTE — ED Notes (Signed)
MD at bedside. berry

## 2021-12-22 NOTE — ED Notes (Signed)
MD at bedside. Berry

## 2021-12-22 NOTE — ED Triage Notes (Signed)
D&C Wednesday, hx of retained POC with past D&C leading to sepsis. Fever starting yesterday. Abd pain. Back pain from fall on ice as well.

## 2021-12-23 LAB — VH CULTURE, URINE

## 2021-12-23 NOTE — Progress Notes (Signed)
No meds at [facility]

## 2021-12-26 NOTE — Progress Notes (Signed)
Attempted to notify patient. Voice Mailbox full. Unable to leave a message

## 2021-12-26 NOTE — Progress Notes (Signed)
Per verbal order Dr Caro Laroche, pt to receive Flagyl 250mg  po bid x 7 days for urine culture results of Lactobacillus. Will need to know pharmacy of choice to call abx in

## 2021-12-27 NOTE — Progress Notes (Signed)
LMTCB

## 2022-01-03 NOTE — Progress Notes (Signed)
No response from patient in regards to message left on contact phone number. No further action at this time

## 2022-10-29 DEATH — deceased

## 2023-02-08 IMAGING — CR DG ABDOMEN ACUTE W/ 1V CHEST
3 series · 3 of 3 positions shown · non-contrast
Comparison: None.

CLINICAL DATA: Abdominal pain

EXAM:
DG ABDOMEN ACUTE WITH 1 VIEW CHEST

[w chest pa]
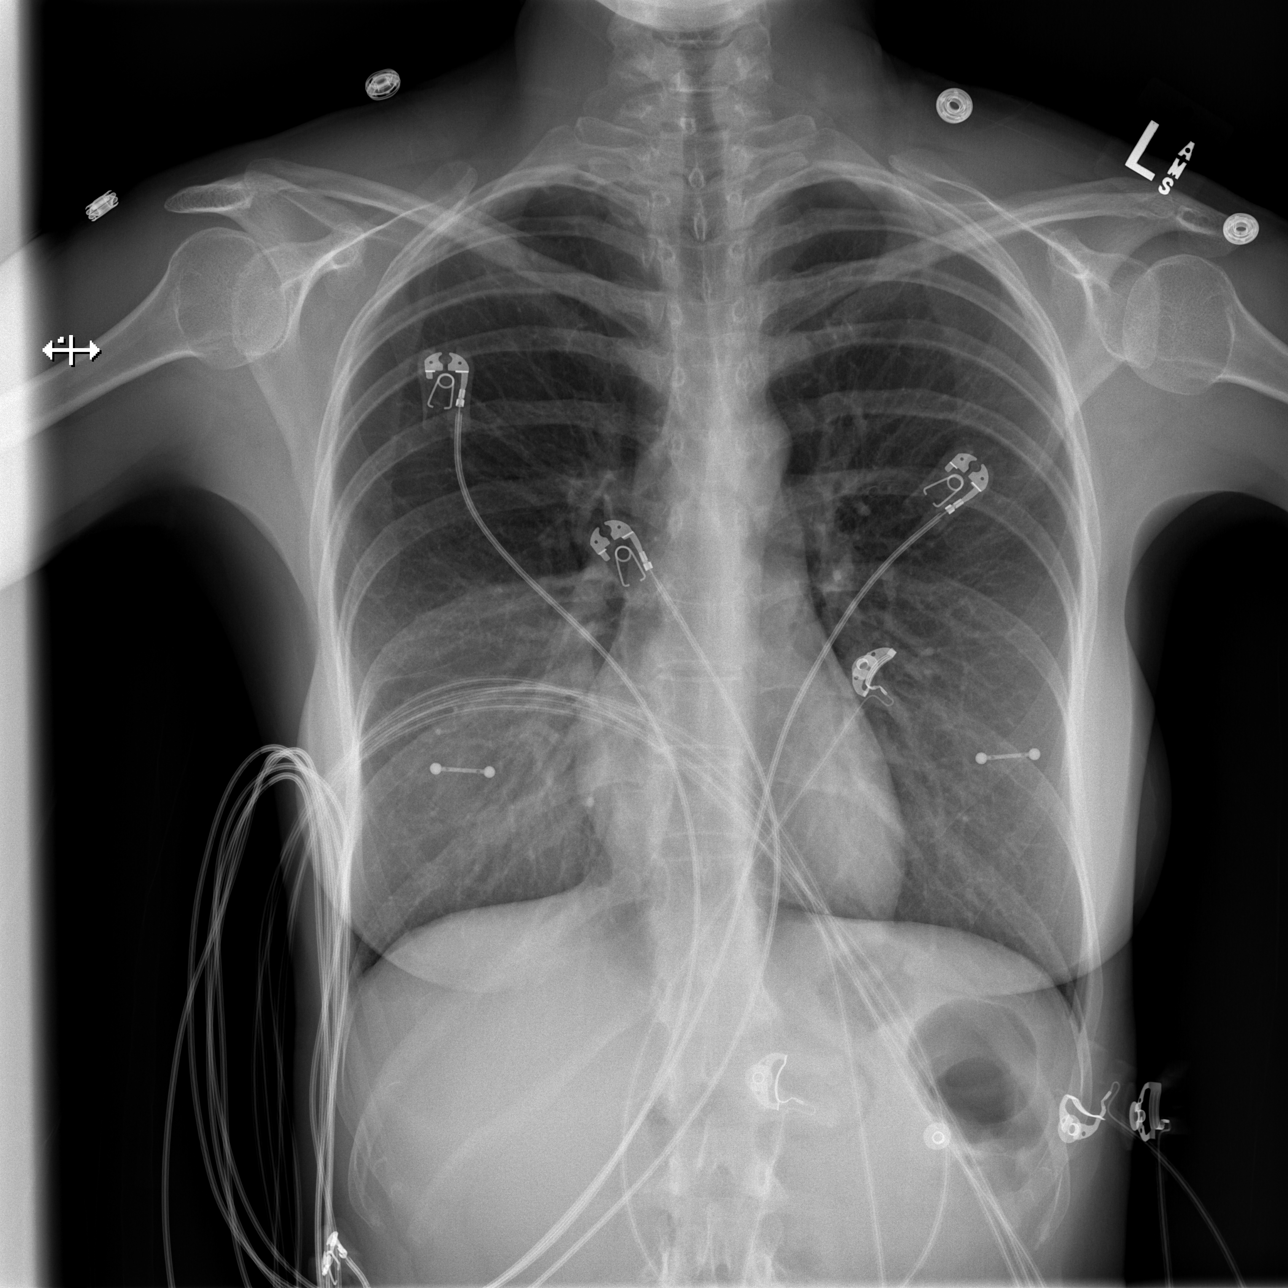

[w abdomen upright]
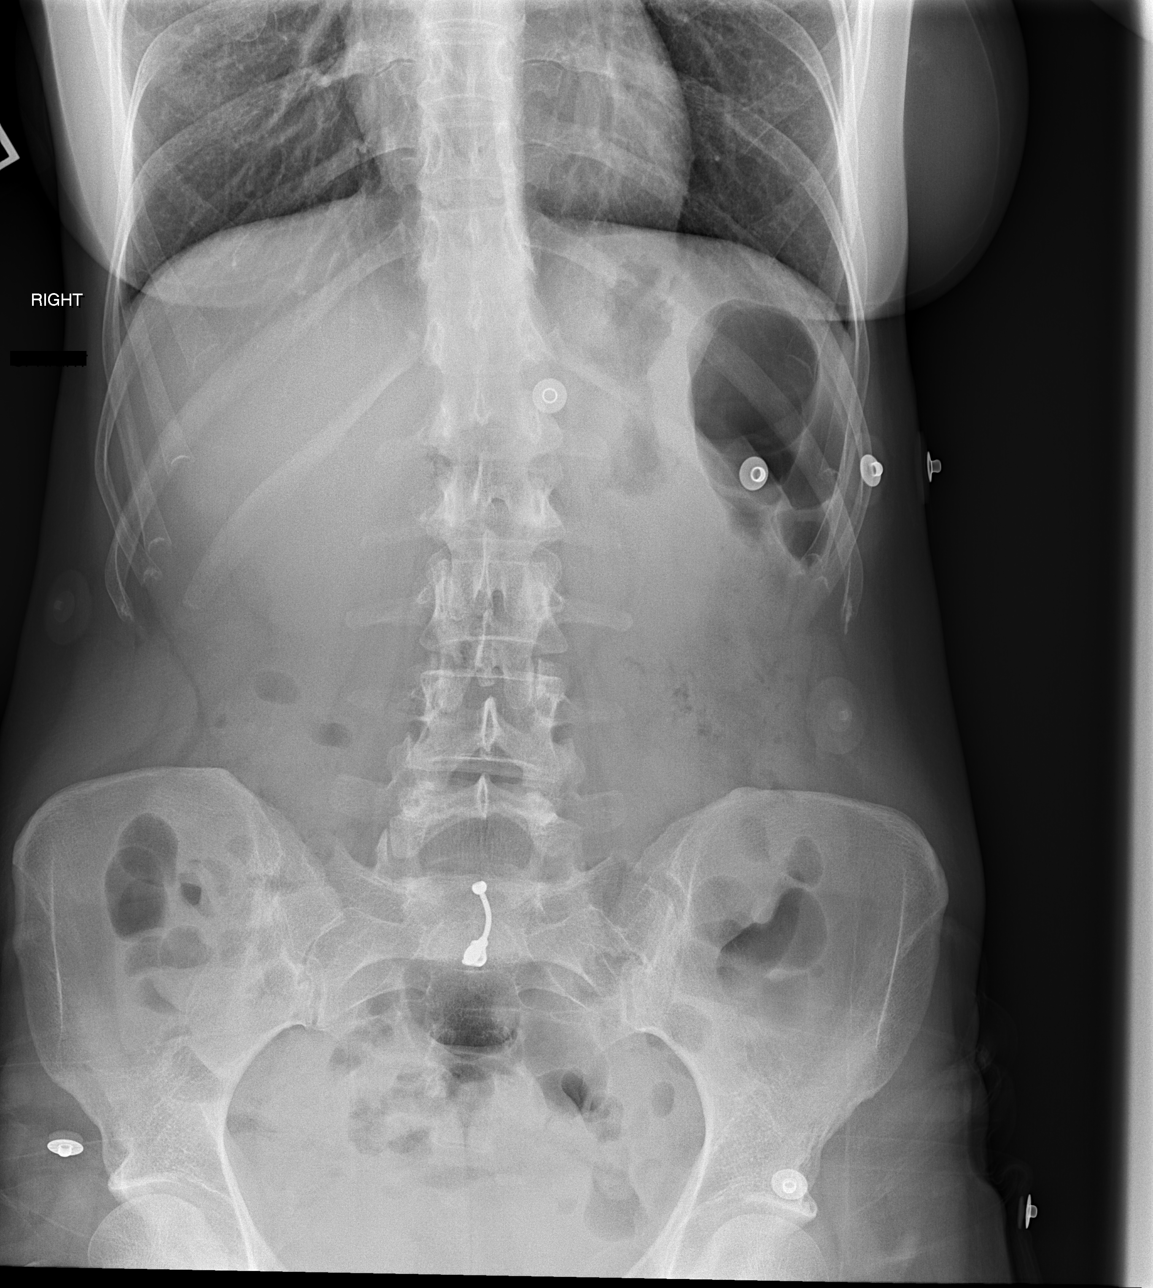

[t abdomen supine]
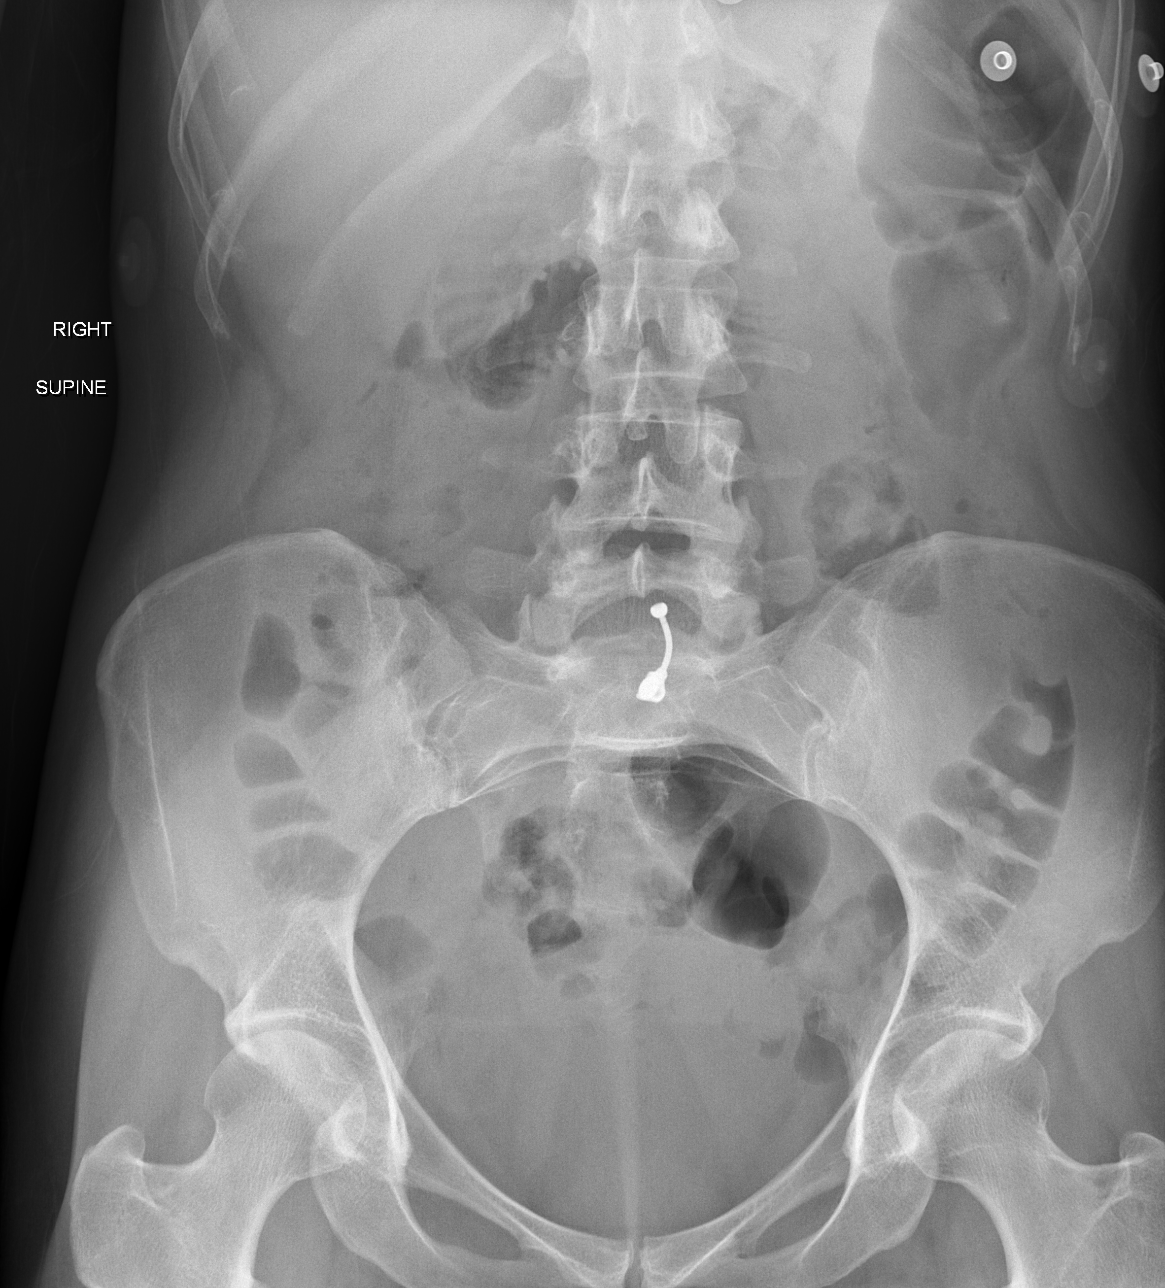

[3 of 3 positions shown; findings below may reference images not displayed]

FINDINGS: There is no evidence of dilated bowel loops or free intraperitoneal
air. No radiopaque calculi or other significant radiographic
abnormality is seen. Heart size and mediastinal contours are within
normal limits. Both lungs are clear.
IMPRESSION: Negative abdominal radiographs.  No acute cardiopulmonary disease.

## 2023-03-21 IMAGING — CT CT ABD-PELV W/O CM
2 of 4 series · 16 of 46 positions shown, 18 images · non-contrast
Comparison: 05/04/2021

CLINICAL DATA: Generalized abdominal pain, history of colitis

EXAM:
CT ABDOMEN AND PELVIS WITHOUT CONTRAST
TECHNIQUE: Multidetector CT imaging of the abdomen and pelvis was performed
following the standard protocol without IV contrast.

[Series 2: axial st · axial · 0.63mm/px · z∈[+1004,+1389]mm · 13 of 87 slices shown, 15 images]
[im 5/87  soft-tissue]
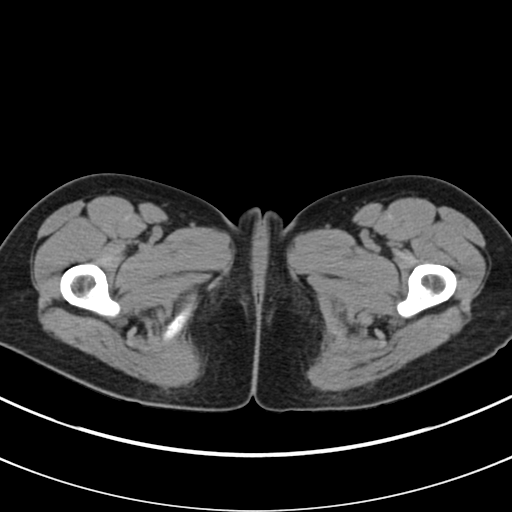
[im 5/87  bone]
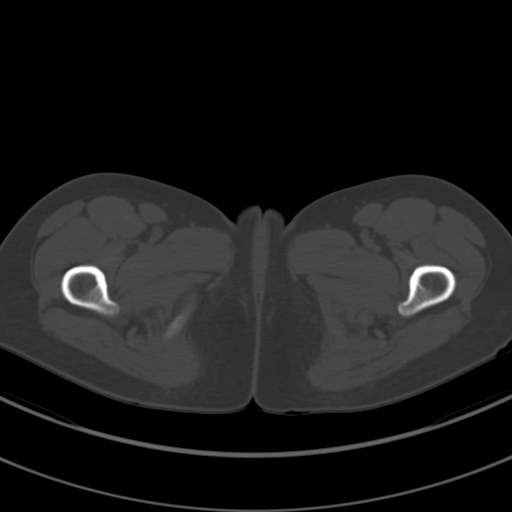
[im 14/87  soft-tissue]
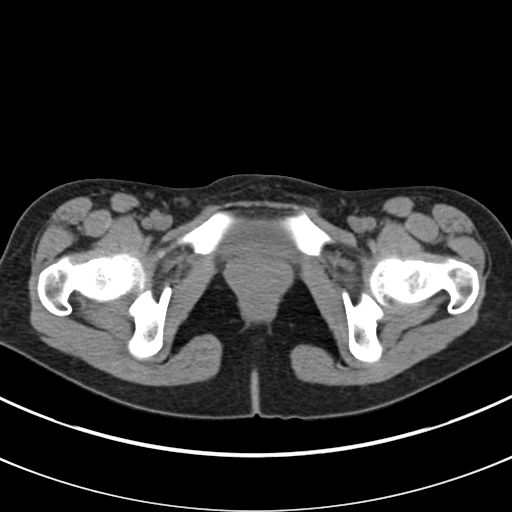
[im 19/87  soft-tissue]
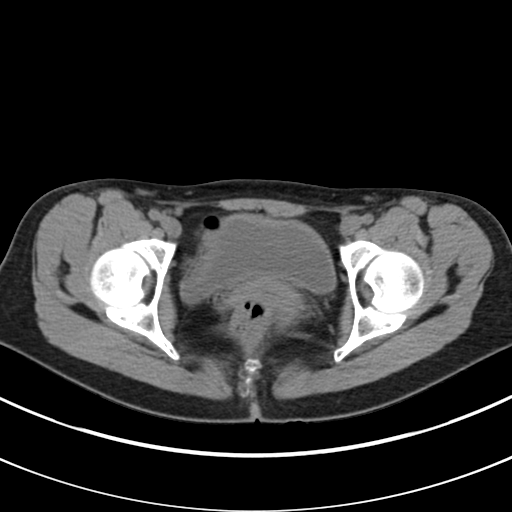
[im 23/87  soft-tissue]
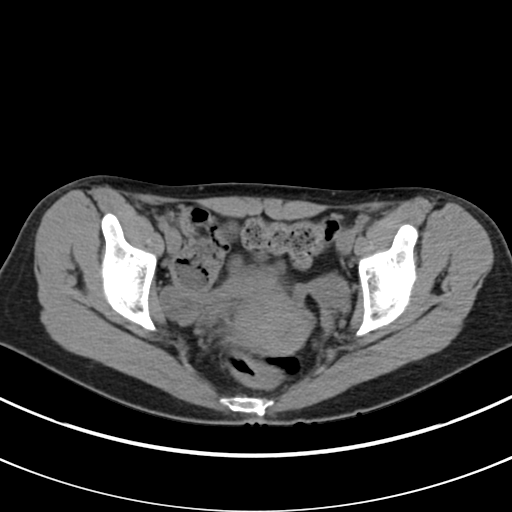
[im 32/87  soft-tissue]
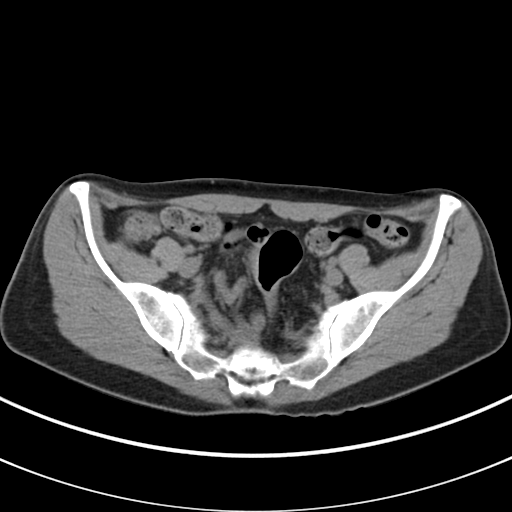
[im 37/87  soft-tissue]
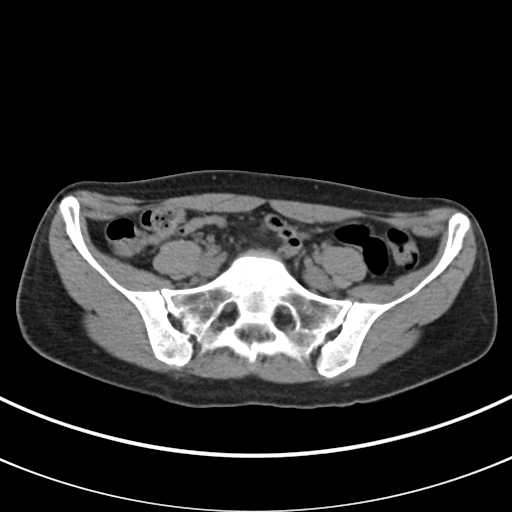
[im 46/87  soft-tissue]
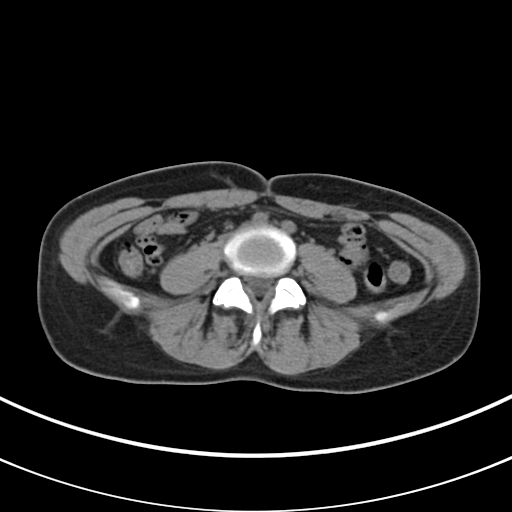
[im 50/87  soft-tissue]
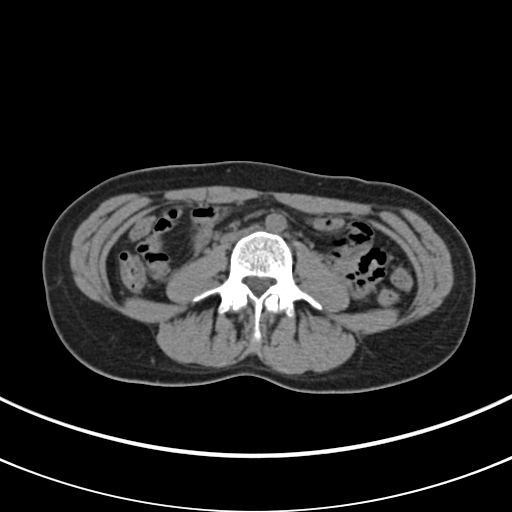
[im 55/87  soft-tissue]
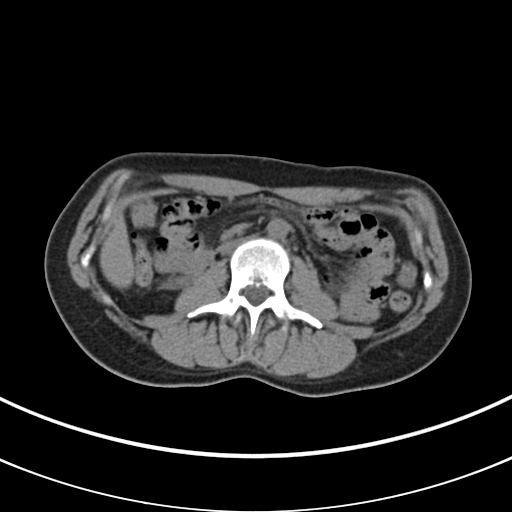
[im 55/87  bone]
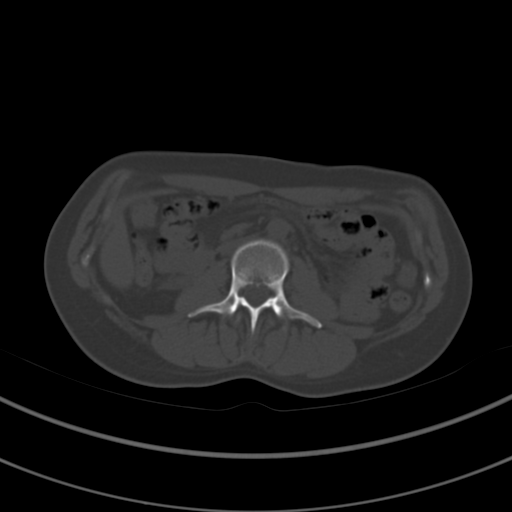
[im 64/87  soft-tissue]
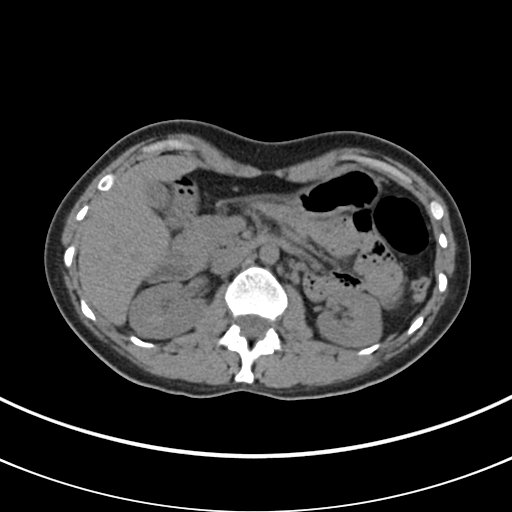
[im 68/87  soft-tissue]
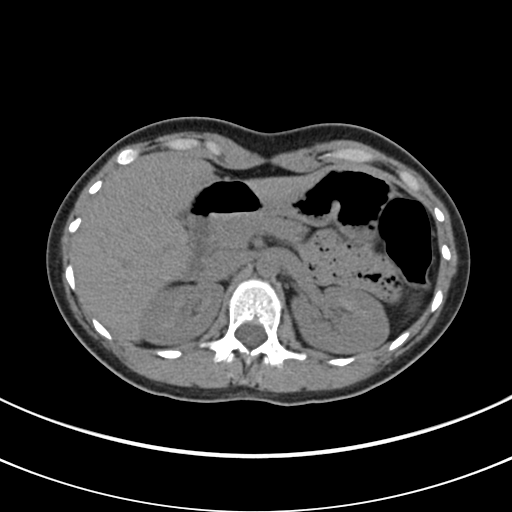
[im 73/87  soft-tissue]
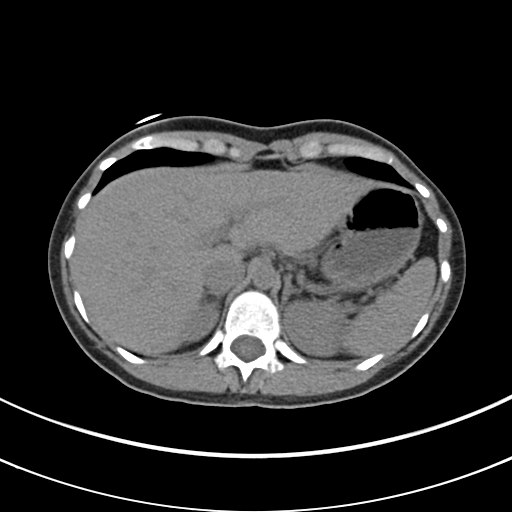
[im 82/87  soft-tissue]
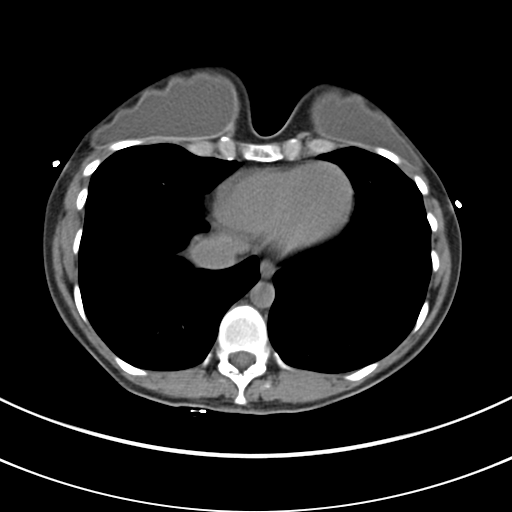

[Series 5: coronal st · coronal · 0.64mm/px · 3 of 98 slices shown]
[im 33/98  soft-tissue]
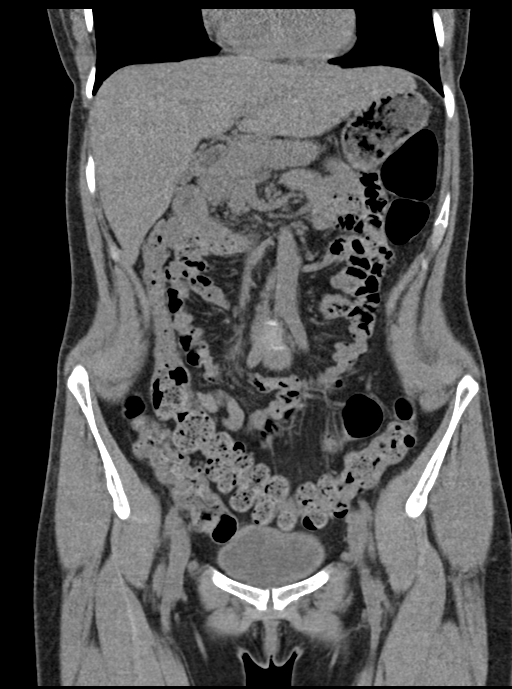
[im 44/98  soft-tissue]
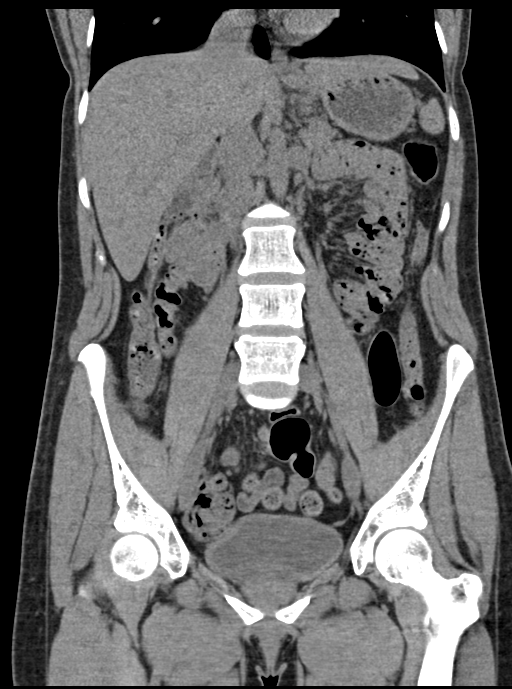
[im 54/98  soft-tissue]
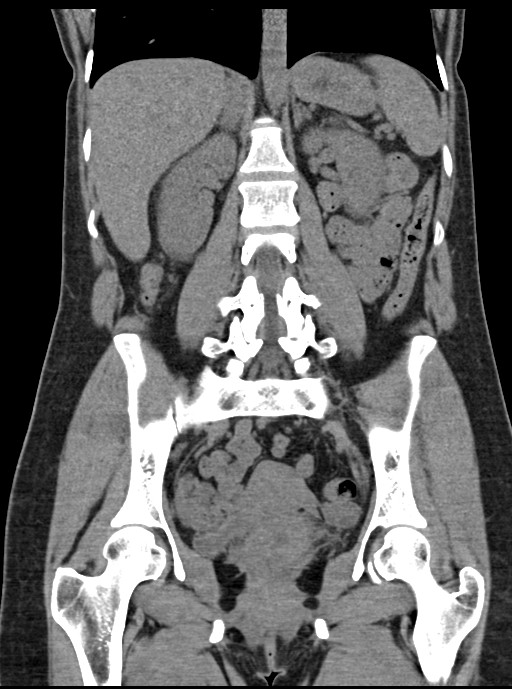

[16 of 46 positions shown; findings below may reference images not displayed]

FINDINGS: Lower chest: Bilateral breast implants are noted. The lung bases are
clear.

Hepatobiliary: No focal liver abnormality is seen. No gallstones,
gallbladder wall thickening, or biliary dilatation.

Pancreas: Unremarkable. No pancreatic ductal dilatation or
surrounding inflammatory changes.

Spleen: Normal in size without focal abnormality.

Adrenals/Urinary Tract: Adrenal glands are within normal limits.
Kidneys demonstrate no renal calculi or obstructive changes. The
ureters are unremarkable. Bladder is decompressed.

Stomach/Bowel: No obstructive or inflammatory changes of the colon
are noted. The appendix is within normal limits. Small bowel shows
no focal abnormality. No inflammatory changes are seen. Stomach is
within normal limits.

Vascular/Lymphatic: No significant vascular findings are present. No
enlarged abdominal or pelvic lymph nodes.

Reproductive: Uterus and bilateral adnexa are unremarkable.

Other: No abdominal wall hernia or abnormality. No abdominopelvic
ascites.

Musculoskeletal: No bony abnormality is noted.
IMPRESSION: Changes in the distal small bowel seen on the prior exam are not
well Shih out on today's study. No inflammatory changes are noted.

No other focal abnormality is noted.
